# Patient Record
Sex: Female | Born: 1935 | Race: White | Hispanic: No | Smoking: Former smoker
Health system: Southern US, Community
[De-identification: ages and names within clinical notes are randomized; demographics above are authoritative.]

## PROBLEM LIST (undated history)

## (undated) DIAGNOSIS — G4730 Sleep apnea, unspecified: Secondary | ICD-10-CM

## (undated) DIAGNOSIS — E785 Hyperlipidemia, unspecified: Secondary | ICD-10-CM

## (undated) DIAGNOSIS — I350 Nonrheumatic aortic (valve) stenosis: Secondary | ICD-10-CM

## (undated) DIAGNOSIS — I10 Essential (primary) hypertension: Secondary | ICD-10-CM

## (undated) DIAGNOSIS — B377 Candidal sepsis: Secondary | ICD-10-CM

## (undated) DIAGNOSIS — I2510 Atherosclerotic heart disease of native coronary artery without angina pectoris: Secondary | ICD-10-CM

## (undated) DIAGNOSIS — C801 Malignant (primary) neoplasm, unspecified: Secondary | ICD-10-CM

## (undated) DIAGNOSIS — M810 Age-related osteoporosis without current pathological fracture: Secondary | ICD-10-CM

## (undated) DIAGNOSIS — Z952 Presence of prosthetic heart valve: Secondary | ICD-10-CM

## (undated) DIAGNOSIS — Z9289 Personal history of other medical treatment: Secondary | ICD-10-CM

## (undated) DIAGNOSIS — I4891 Unspecified atrial fibrillation: Secondary | ICD-10-CM

## (undated) DIAGNOSIS — F0390 Unspecified dementia without behavioral disturbance: Secondary | ICD-10-CM

## (undated) DIAGNOSIS — I4819 Other persistent atrial fibrillation: Secondary | ICD-10-CM

## (undated) DIAGNOSIS — E039 Hypothyroidism, unspecified: Secondary | ICD-10-CM

## (undated) HISTORY — DX: Essential (primary) hypertension: I10

## (undated) HISTORY — PX: JOINT REPLACEMENT: SHX530

## (undated) HISTORY — DX: Hypothyroidism, unspecified: E03.9

## (undated) HISTORY — PX: KNEE SURGERY: SHX244

## (undated) HISTORY — DX: Sleep apnea, unspecified: G47.30

## (undated) HISTORY — DX: Hyperlipidemia, unspecified: E78.5

## (undated) HISTORY — PX: TONSILLECTOMY: SUR1361

## (undated) HISTORY — DX: Age-related osteoporosis without current pathological fracture: M81.0

---

## 1999-03-09 ENCOUNTER — Other Ambulatory Visit: Admission: RE | Admit: 1999-03-09 | Discharge: 1999-03-09 | Payer: Self-pay | Admitting: Obstetrics and Gynecology

## 1999-08-22 ENCOUNTER — Encounter: Payer: Self-pay | Admitting: Plastic Surgery

## 1999-08-22 ENCOUNTER — Ambulatory Visit (HOSPITAL_COMMUNITY): Admission: RE | Admit: 1999-08-22 | Discharge: 1999-08-22 | Payer: Self-pay | Admitting: Plastic Surgery

## 1999-09-14 ENCOUNTER — Inpatient Hospital Stay (HOSPITAL_COMMUNITY): Admission: RE | Admit: 1999-09-14 | Discharge: 1999-09-17 | Payer: Self-pay | Admitting: Plastic Surgery

## 2000-07-10 ENCOUNTER — Other Ambulatory Visit: Admission: RE | Admit: 2000-07-10 | Discharge: 2000-07-10 | Payer: Self-pay | Admitting: Obstetrics and Gynecology

## 2000-08-01 ENCOUNTER — Encounter: Payer: Self-pay | Admitting: Obstetrics and Gynecology

## 2000-08-06 ENCOUNTER — Ambulatory Visit (HOSPITAL_COMMUNITY): Admission: RE | Admit: 2000-08-06 | Discharge: 2000-08-06 | Payer: Self-pay | Admitting: Obstetrics and Gynecology

## 2000-08-06 ENCOUNTER — Encounter (INDEPENDENT_AMBULATORY_CARE_PROVIDER_SITE_OTHER): Payer: Self-pay | Admitting: Specialist

## 2000-08-11 ENCOUNTER — Encounter: Payer: Self-pay | Admitting: Orthopedic Surgery

## 2000-08-19 ENCOUNTER — Ambulatory Visit (HOSPITAL_COMMUNITY): Admission: RE | Admit: 2000-08-19 | Discharge: 2000-08-19 | Payer: Self-pay | Admitting: *Deleted

## 2000-09-30 ENCOUNTER — Inpatient Hospital Stay (HOSPITAL_COMMUNITY): Admission: RE | Admit: 2000-09-30 | Discharge: 2000-10-07 | Payer: Self-pay | Admitting: Orthopedic Surgery

## 2000-09-30 ENCOUNTER — Encounter: Payer: Self-pay | Admitting: Orthopedic Surgery

## 2000-11-27 ENCOUNTER — Encounter: Admission: RE | Admit: 2000-11-27 | Discharge: 2000-11-27 | Payer: Self-pay | Admitting: Obstetrics and Gynecology

## 2000-11-27 ENCOUNTER — Encounter: Payer: Self-pay | Admitting: Obstetrics and Gynecology

## 2001-11-30 ENCOUNTER — Encounter: Admission: RE | Admit: 2001-11-30 | Discharge: 2001-11-30 | Payer: Self-pay | Admitting: Obstetrics and Gynecology

## 2001-11-30 ENCOUNTER — Encounter: Payer: Self-pay | Admitting: Obstetrics and Gynecology

## 2002-10-18 ENCOUNTER — Ambulatory Visit (HOSPITAL_BASED_OUTPATIENT_CLINIC_OR_DEPARTMENT_OTHER): Admission: RE | Admit: 2002-10-18 | Discharge: 2002-10-18 | Payer: Self-pay | Admitting: General Practice

## 2002-12-02 ENCOUNTER — Encounter: Payer: Self-pay | Admitting: Obstetrics and Gynecology

## 2002-12-02 ENCOUNTER — Encounter: Admission: RE | Admit: 2002-12-02 | Discharge: 2002-12-02 | Payer: Self-pay | Admitting: Obstetrics and Gynecology

## 2003-02-09 ENCOUNTER — Ambulatory Visit (HOSPITAL_COMMUNITY): Admission: RE | Admit: 2003-02-09 | Discharge: 2003-02-09 | Payer: Self-pay | Admitting: Gastroenterology

## 2003-02-22 ENCOUNTER — Encounter: Payer: Self-pay | Admitting: Orthopedic Surgery

## 2003-02-28 ENCOUNTER — Encounter: Payer: Self-pay | Admitting: Orthopedic Surgery

## 2003-02-28 ENCOUNTER — Inpatient Hospital Stay (HOSPITAL_COMMUNITY): Admission: RE | Admit: 2003-02-28 | Discharge: 2003-03-07 | Payer: Self-pay | Admitting: Orthopedic Surgery

## 2003-08-20 ENCOUNTER — Emergency Department (HOSPITAL_COMMUNITY): Admission: EM | Admit: 2003-08-20 | Discharge: 2003-08-20 | Payer: Self-pay | Admitting: Emergency Medicine

## 2003-08-20 ENCOUNTER — Encounter: Payer: Self-pay | Admitting: *Deleted

## 2003-11-03 ENCOUNTER — Ambulatory Visit (HOSPITAL_COMMUNITY): Admission: RE | Admit: 2003-11-03 | Discharge: 2003-11-03 | Payer: Self-pay | Admitting: Orthopedic Surgery

## 2003-12-06 ENCOUNTER — Encounter: Admission: RE | Admit: 2003-12-06 | Discharge: 2003-12-06 | Payer: Self-pay | Admitting: Obstetrics and Gynecology

## 2004-04-12 ENCOUNTER — Ambulatory Visit (HOSPITAL_COMMUNITY): Admission: RE | Admit: 2004-04-12 | Discharge: 2004-04-12 | Payer: Self-pay | Admitting: Obstetrics and Gynecology

## 2004-05-04 ENCOUNTER — Emergency Department (HOSPITAL_COMMUNITY): Admission: EM | Admit: 2004-05-04 | Discharge: 2004-05-04 | Payer: Self-pay | Admitting: Emergency Medicine

## 2004-12-07 ENCOUNTER — Encounter: Admission: RE | Admit: 2004-12-07 | Discharge: 2004-12-07 | Payer: Self-pay | Admitting: Obstetrics and Gynecology

## 2005-08-28 IMAGING — US US TRANSVAGINAL NON-OB
1 series · 13 of 25 positions shown · non-contrast
Comparison: none

CLINICAL DATA: Right lower quadrant pain.  
TRANSABDOMINAL AND ENDOVAGINAL PELVIS ULTRASOUND:
Multiple images of the uterus and adnexa were obtained using a transabdominal and endovaginal approaches. 
The uterus has a maximal sagittal length of 6.9 cm and maximal AP width of 3.6 cm.  A homogeneous uterine myometrium is seen.  There is a small amount of fluid identified within the endometrial canal.  The majority of the endometrial lining appears thin and echogenic with a maximal AP width of 1.3 mm.  There is an area of focal thickening noted emanating off of the anterior upper uterine segment portion of the endometrial canal.  This is echogenic measuring approximately 2 x 2 mm and does demonstrate some Intralesional flow suggesting the presence of a focal polyp.  The remainder of the lining appears thin with a single layer measurement of 1 mm.  
Several areas of focally altered echotexture are identified compatible with focal fibroids.  One in the right lateral upper uterine segment measuring 1.5 x 1.0 x 1.4 cm and one in the posterior upper uterine segment measuring 0.9 x 0.7 x 0.7 cm.  
Both ovaries are seen and have a normal appearance with the left ovary measuring 2.8 x 1.0 x 1.8 cm and the right ovary measuring 2.7 x 2.5 x 1.8 cm.  No cul-de-sac or periovarian fluid is noted and no separate adnexal masses are seen.
IMPRESSION
Probable small polyp emanating off of the anterior portion of the otherwise thin endometrial stripe.  Focal fibroids with sizes and locations as noted above.  Normal ovaries.

[Series 1: unknown · 0.40mm/px · 13 of 38 slices shown]
[im 1/38]
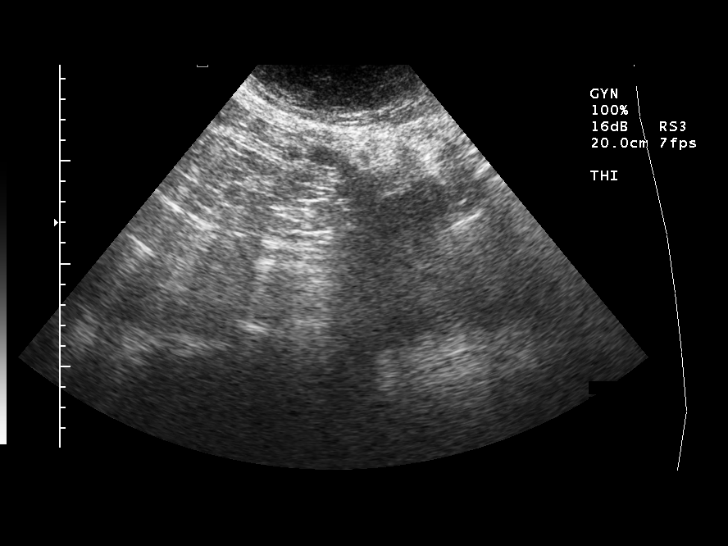
[im 4/38]
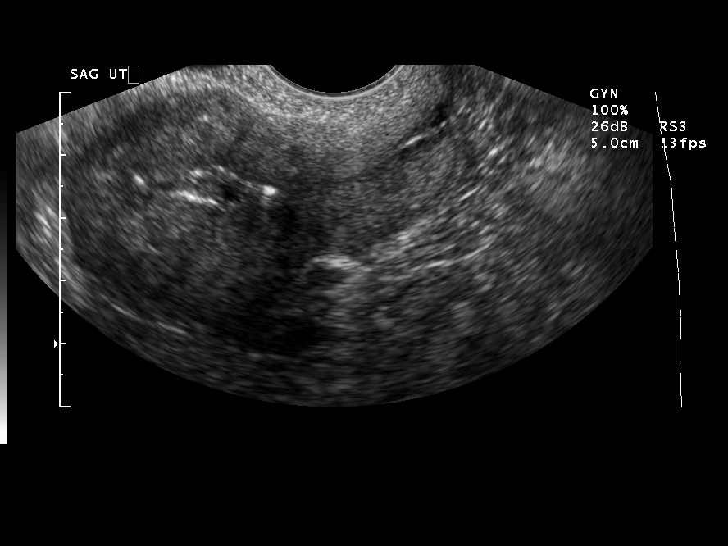
[im 7/38]
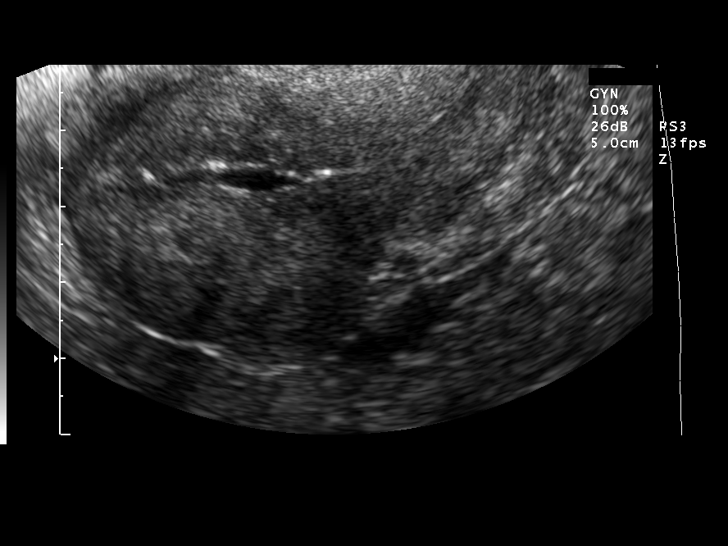
[im 10/38]
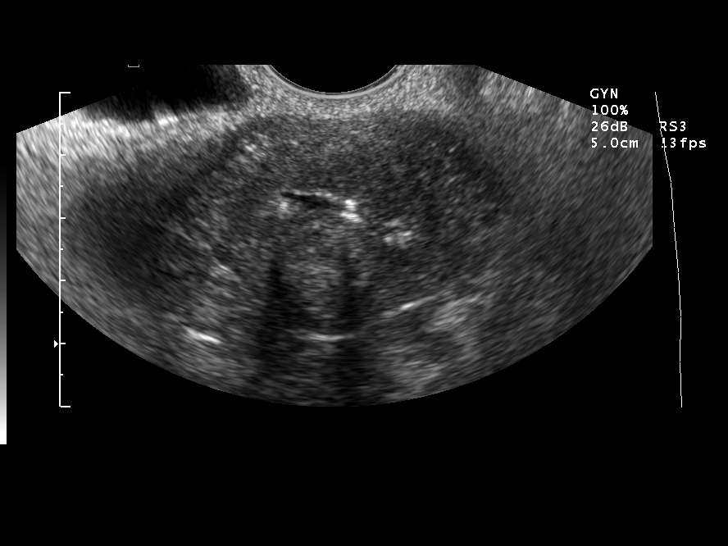
[im 13/38]
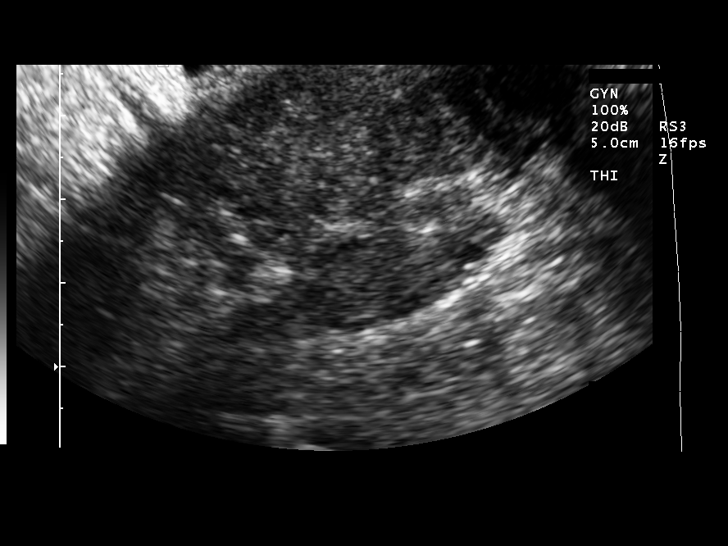
[im 16/38]
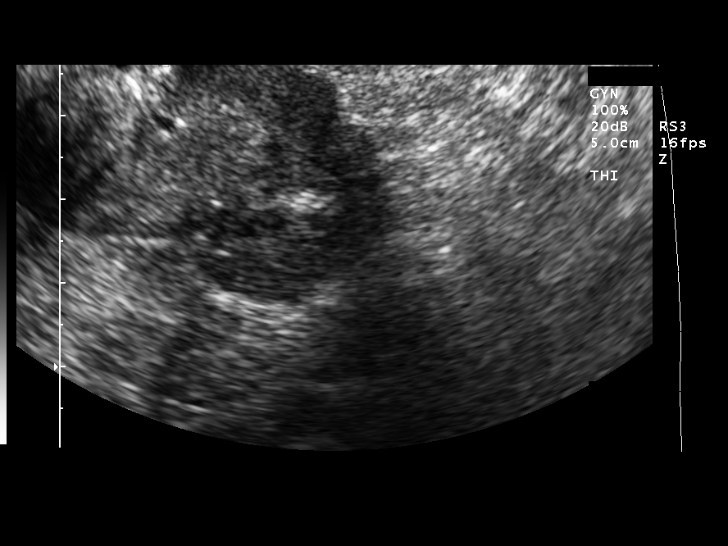
[im 19/38]
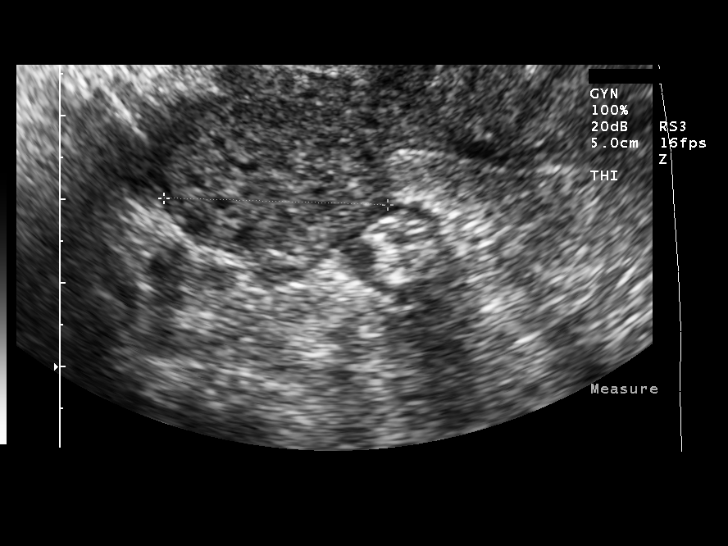
[im 22/38]
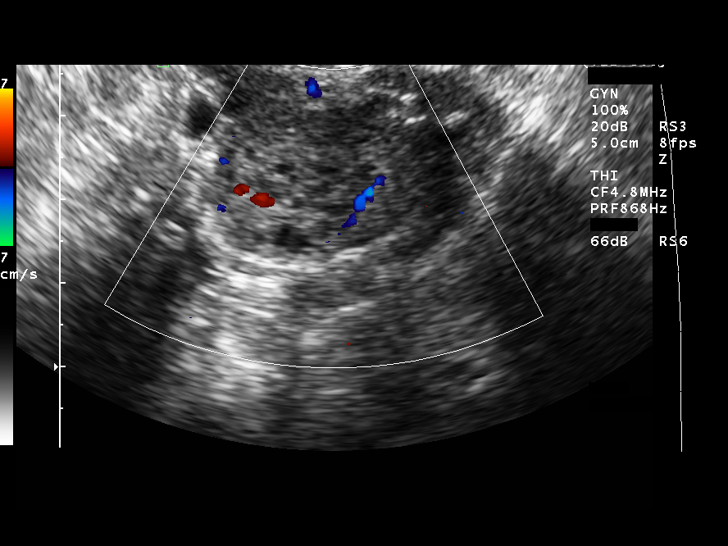
[im 25/38]
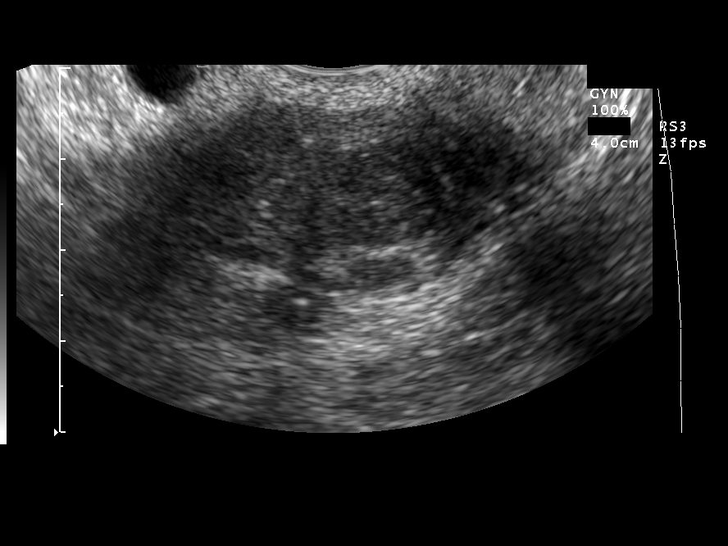
[im 28/38]
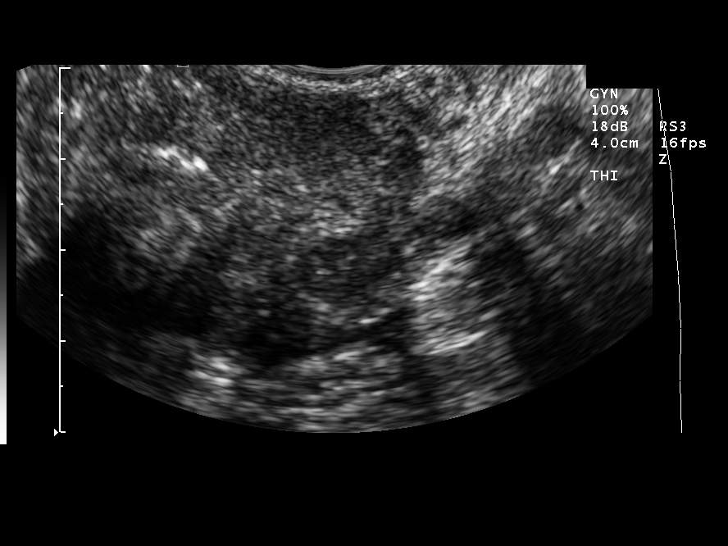
[im 31/38]
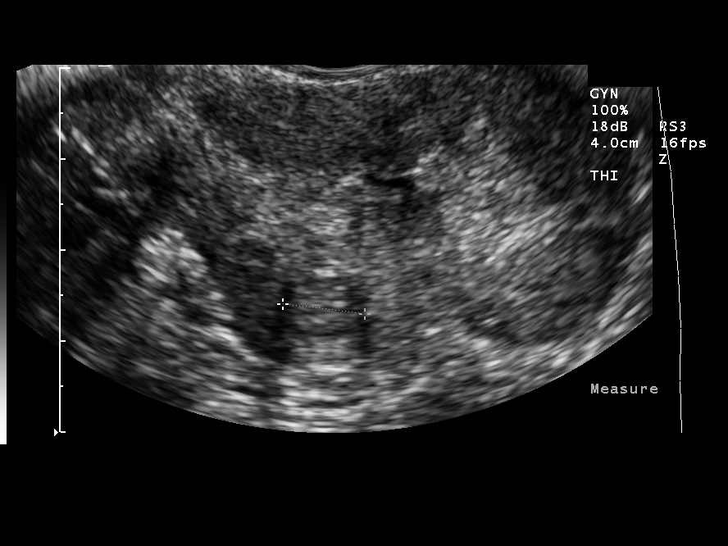
[im 34/38]
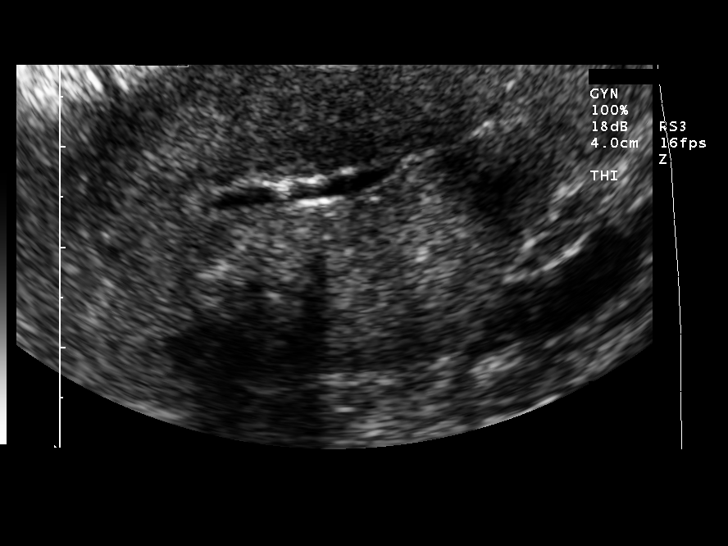
[im 38/38]
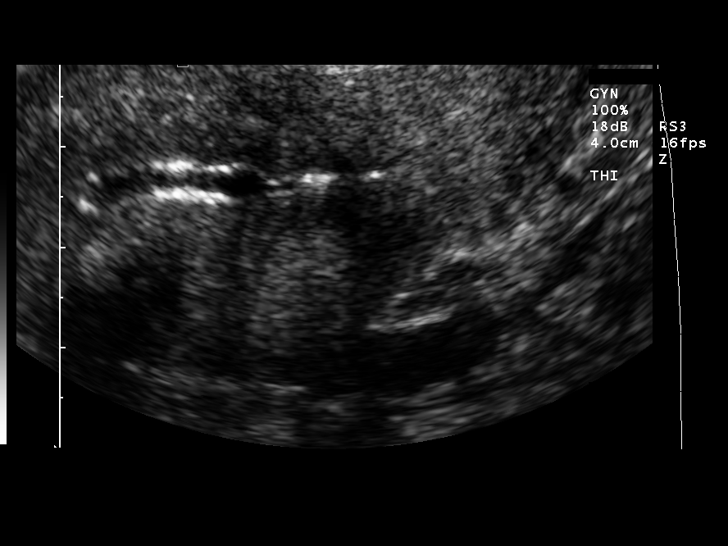

[13 of 25 positions shown; findings below may reference images not displayed]

## 2005-09-19 IMAGING — CR DG CHEST 2V
2 series · 2 of 2 positions shown · non-contrast
Comparison: none

CLINICAL DATA: Fell with pain
 CHEST
 Two views of the chest are compared to a portable film of 08/20/03.  Cardiomegaly is stable.  No active infiltrate of effusion is seen.  There are degenerative changes throughout the thoracic spine.  No compression deformity is seen.
 IMPRESSION
 Stable cardiomegaly.  No active lung disease.

[view not recorded (1 of 2)]
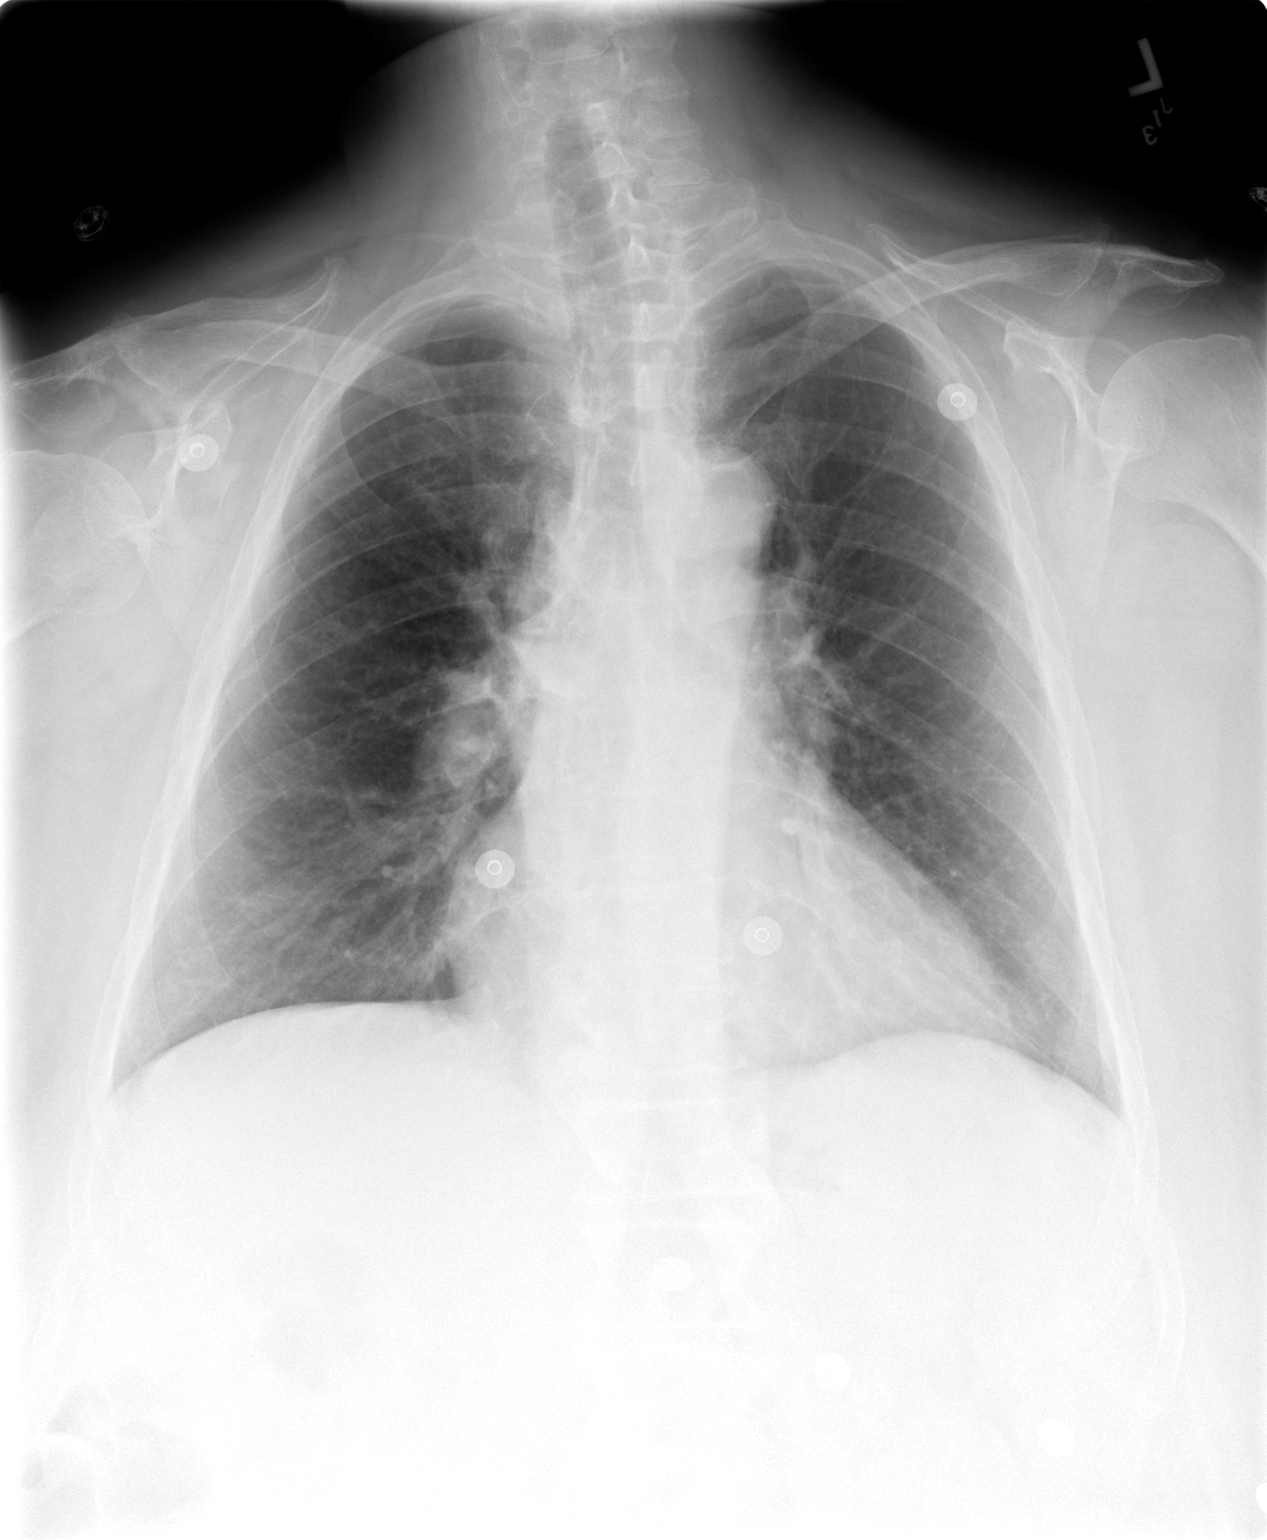

[view not recorded (2 of 2)]
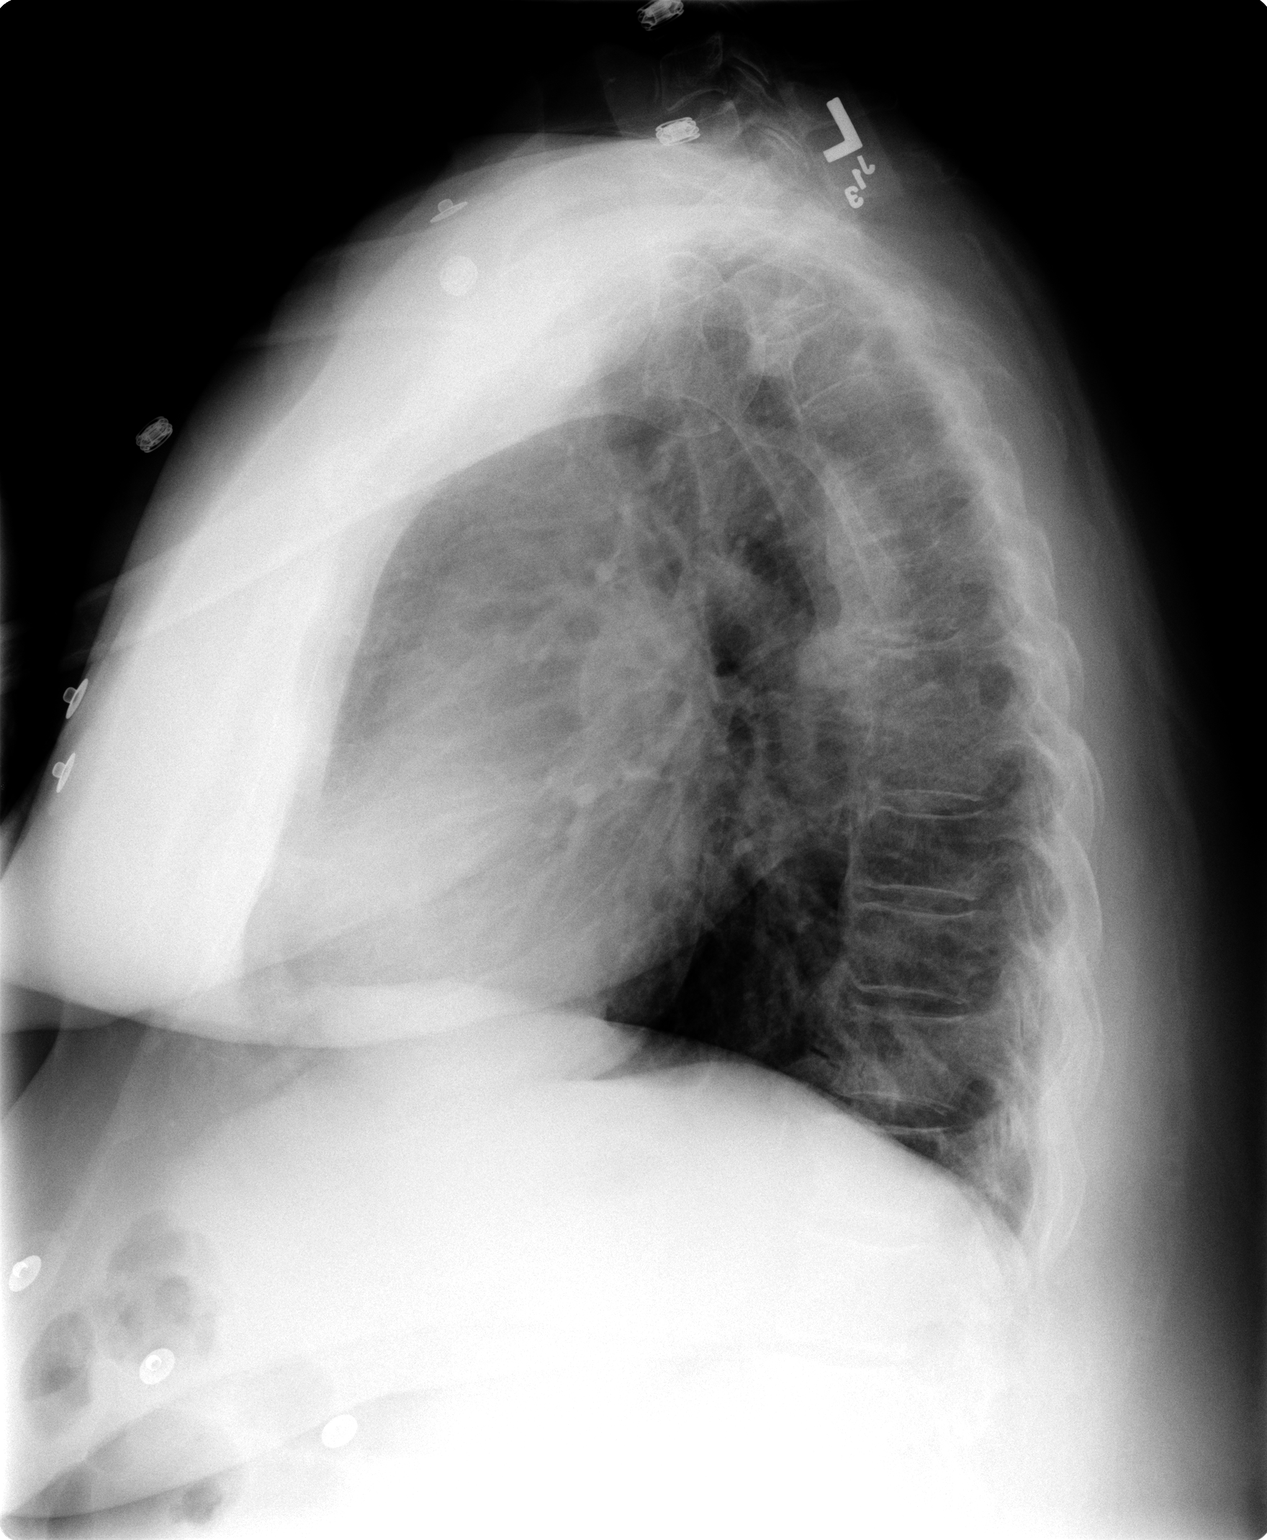

[2 of 2 positions shown; findings below may reference images not displayed]

## 2005-09-19 IMAGING — CT CT ABDOMEN W/ CM
1 series · 15 of 32 positions shown, 19 images · IV contrast (omnipaque)
Comparison: none

CLINICAL DATA: Fall.
 CT SCAN OF THE ABDOMEN WITH INTRAVENOUS AND ORAL CONTRAST ? 05/04/04
 Scans were performed following intravenous injection of 100 cc of Omnipaque 300.
 The scan demonstrates that the liver, spleen, pancreas, and adrenal glands appear normal.  There are numerous parapelvic cysts in both kidneys but there is no evidence of hydronephrosis.  There is slight deformity of the anterior aspect of the right sixth, seventh, and eighth ribs consistent with slight cortical fractures.  In addition, there is a deformity of the anterior aspect of the left sixth rib.  I suspect these could be new slight fractures.  
 The scan does demonstrate evidence of at least one gallstone.  
 Degenerative changes are present in the lower lumbar spine.
 There is no free air or free fluid in the abdomen.
 IMPRESSION
 1.  Probable nondisplaced slight cortical buckle fractures of several right anterior ribs and one left anterior rib.
 2.  Gallstones.
 3.  No acute abnormality of the liver or spleen.
 CT SCAN OF THE PELVIS WITH INTRAVENOUS CONTRAST ? 05/04/04
 There is no acute bony abnormality.  The uterus and ovaries demonstrate no discrete abnormality.  The right ovary is minimally prominent at 4.0 x 3.0 cm but there is no distinct mass or free fluid.  
 No significant abnormality of the pelvis.

[Series 2: routine abdomen · axial · 0.92mm/px · z∈[-445,-60]mm · 15 of 124 slices shown, 19 images]
[im 8/124  soft-tissue]
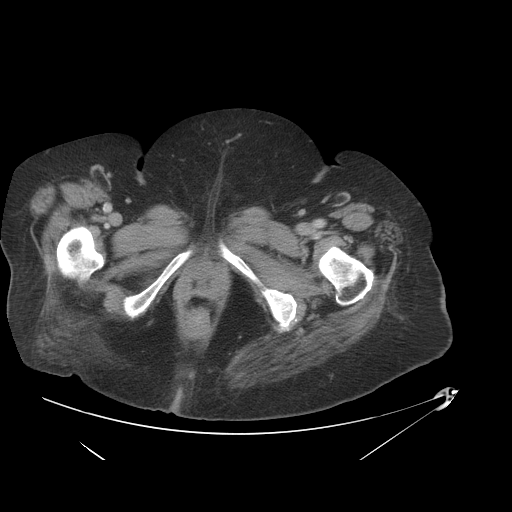
[im 8/124  bone]
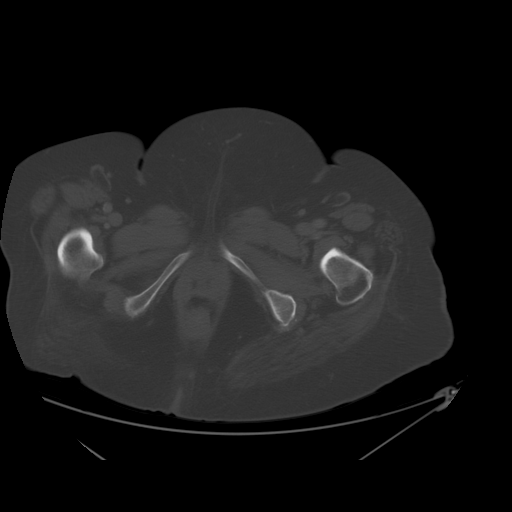
[im 16/124  soft-tissue]
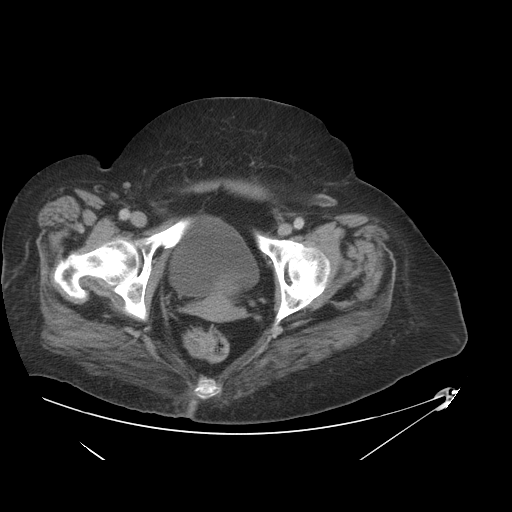
[im 24/124  soft-tissue]
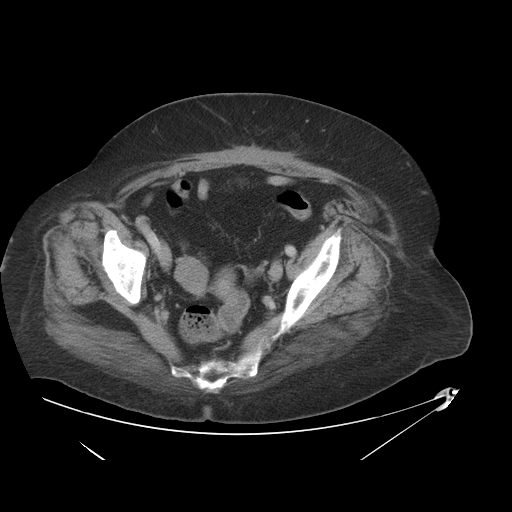
[im 36/124  soft-tissue]
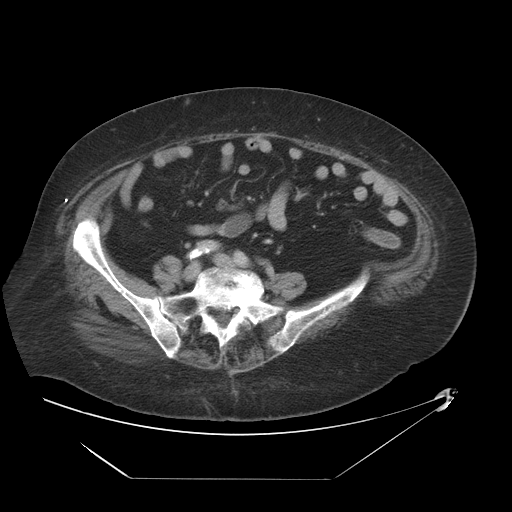
[im 44/124  soft-tissue]
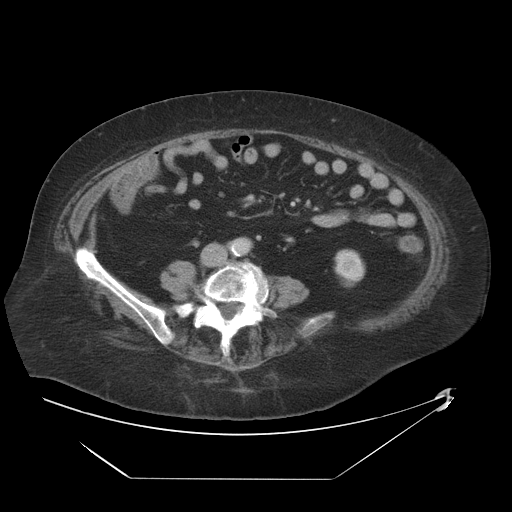
[im 52/124  soft-tissue]
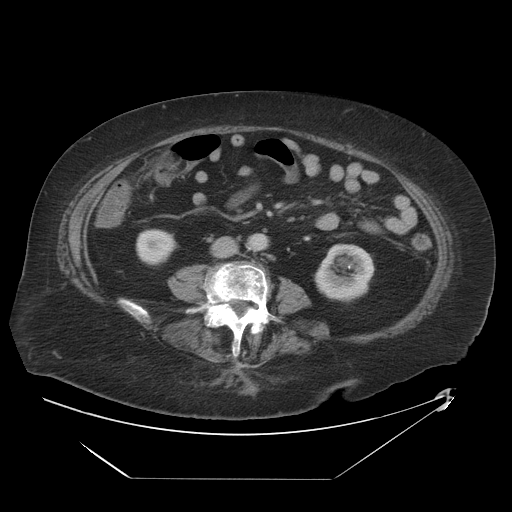
[im 64/124  soft-tissue]
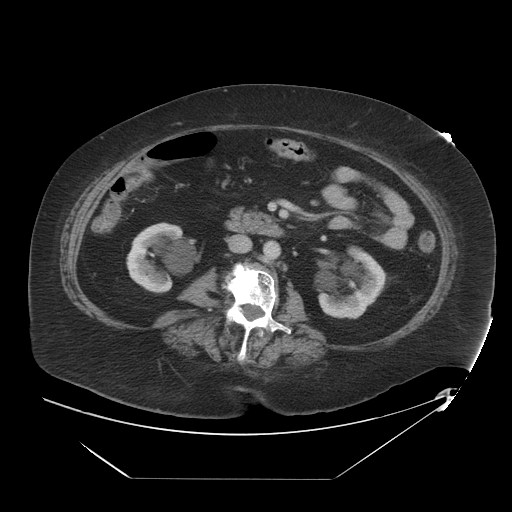
[im 72/124  soft-tissue]
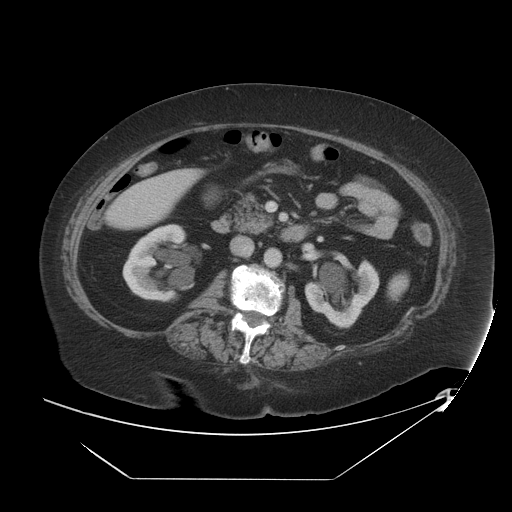
[im 80/124  soft-tissue]
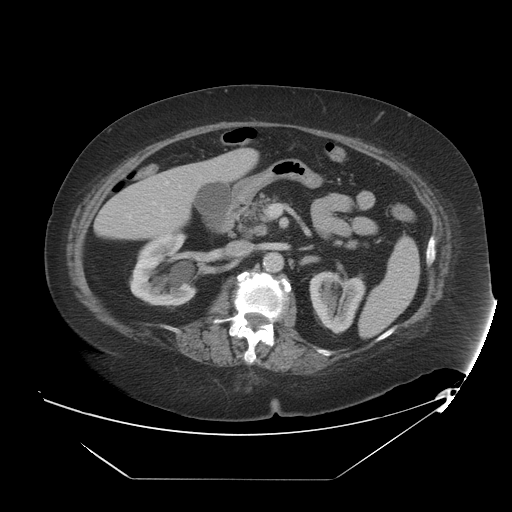
[im 80/124  bone]
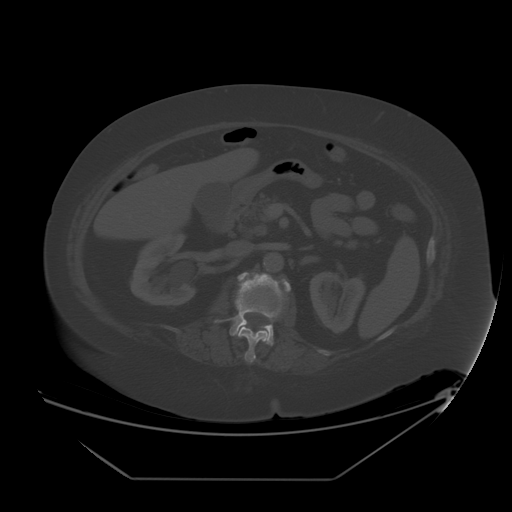
[im 88/124  soft-tissue]
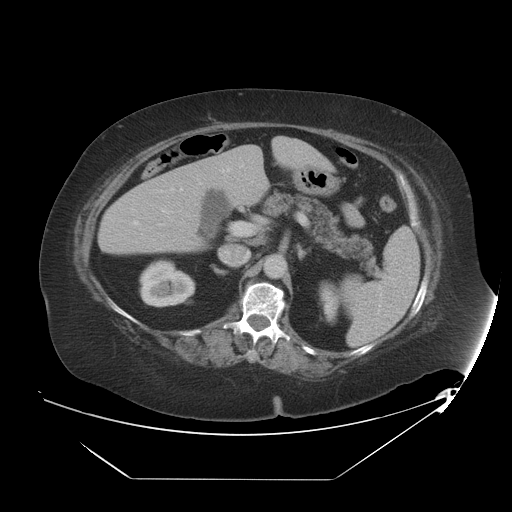
[im 100/124  soft-tissue]
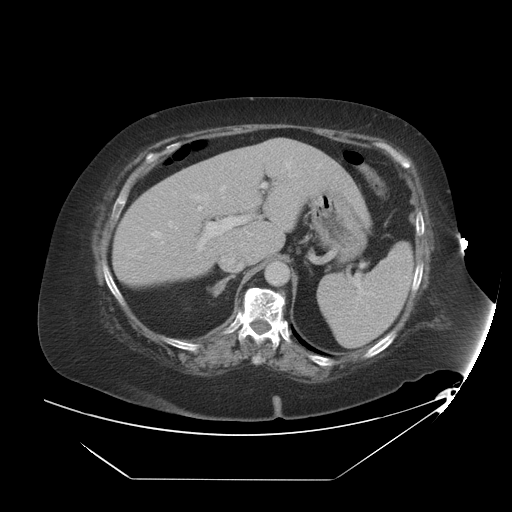
[im 108/124  soft-tissue]
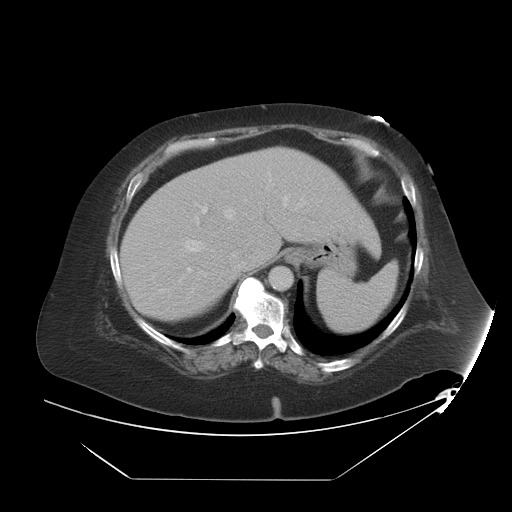
[im 108/124  lung]
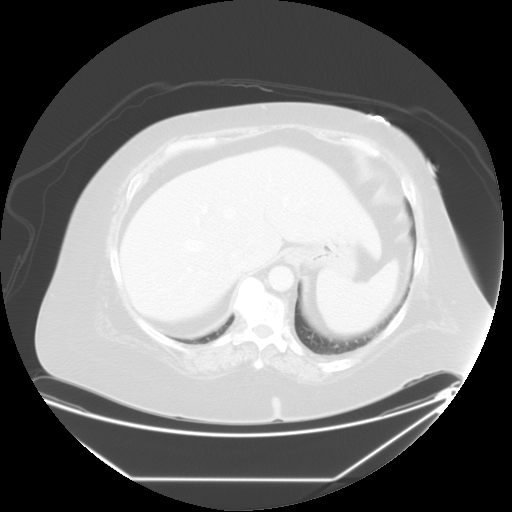
[im 112/124  lung]
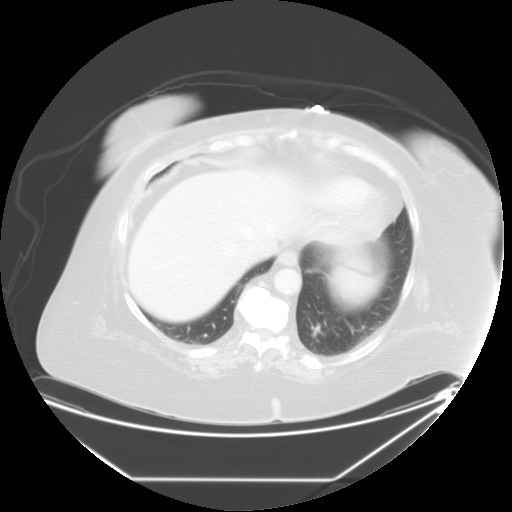
[im 116/124  soft-tissue]
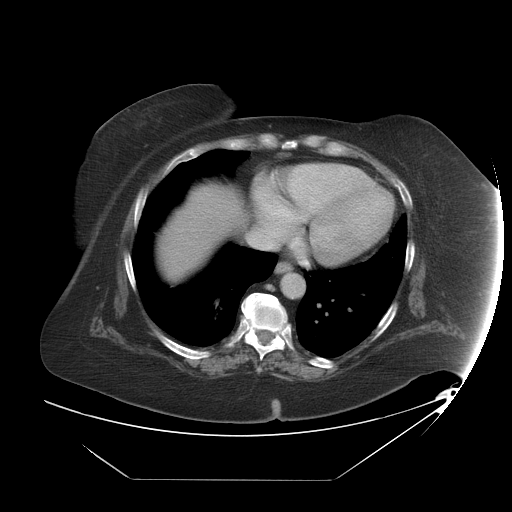
[im 116/124  lung]
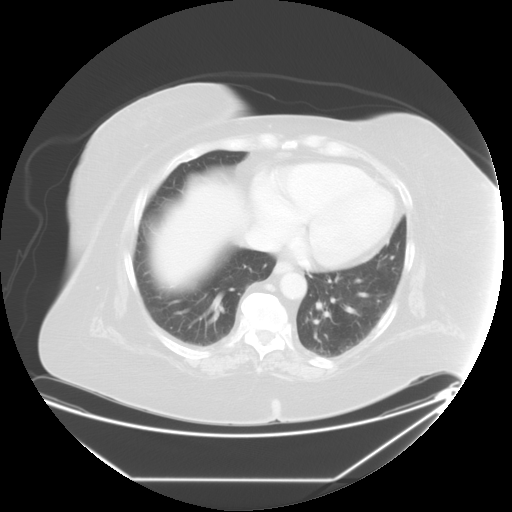
[im 120/124  lung]
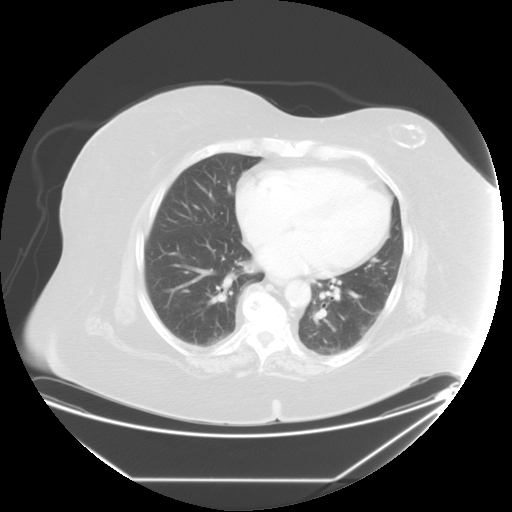

[15 of 32 positions shown; findings below may reference images not displayed]

## 2005-09-19 IMAGING — CR DG WRIST COMPLETE 3+V*R*
2 series · 2 of 2 positions shown · non-contrast
Comparison: none

CLINICAL DATA: Recent fall with pain.
 RIGHT WRIST 
 Four views of the right wrist were obtained.  There is some soft tissue swelling over the dorsum of the wrist but no acute fracture is seen.  The radiocarpal joint space is normal.  There is some arterial calcification noted.  
 IMPRESSION
 No acute fracture.

[view not recorded (1 of 2)]
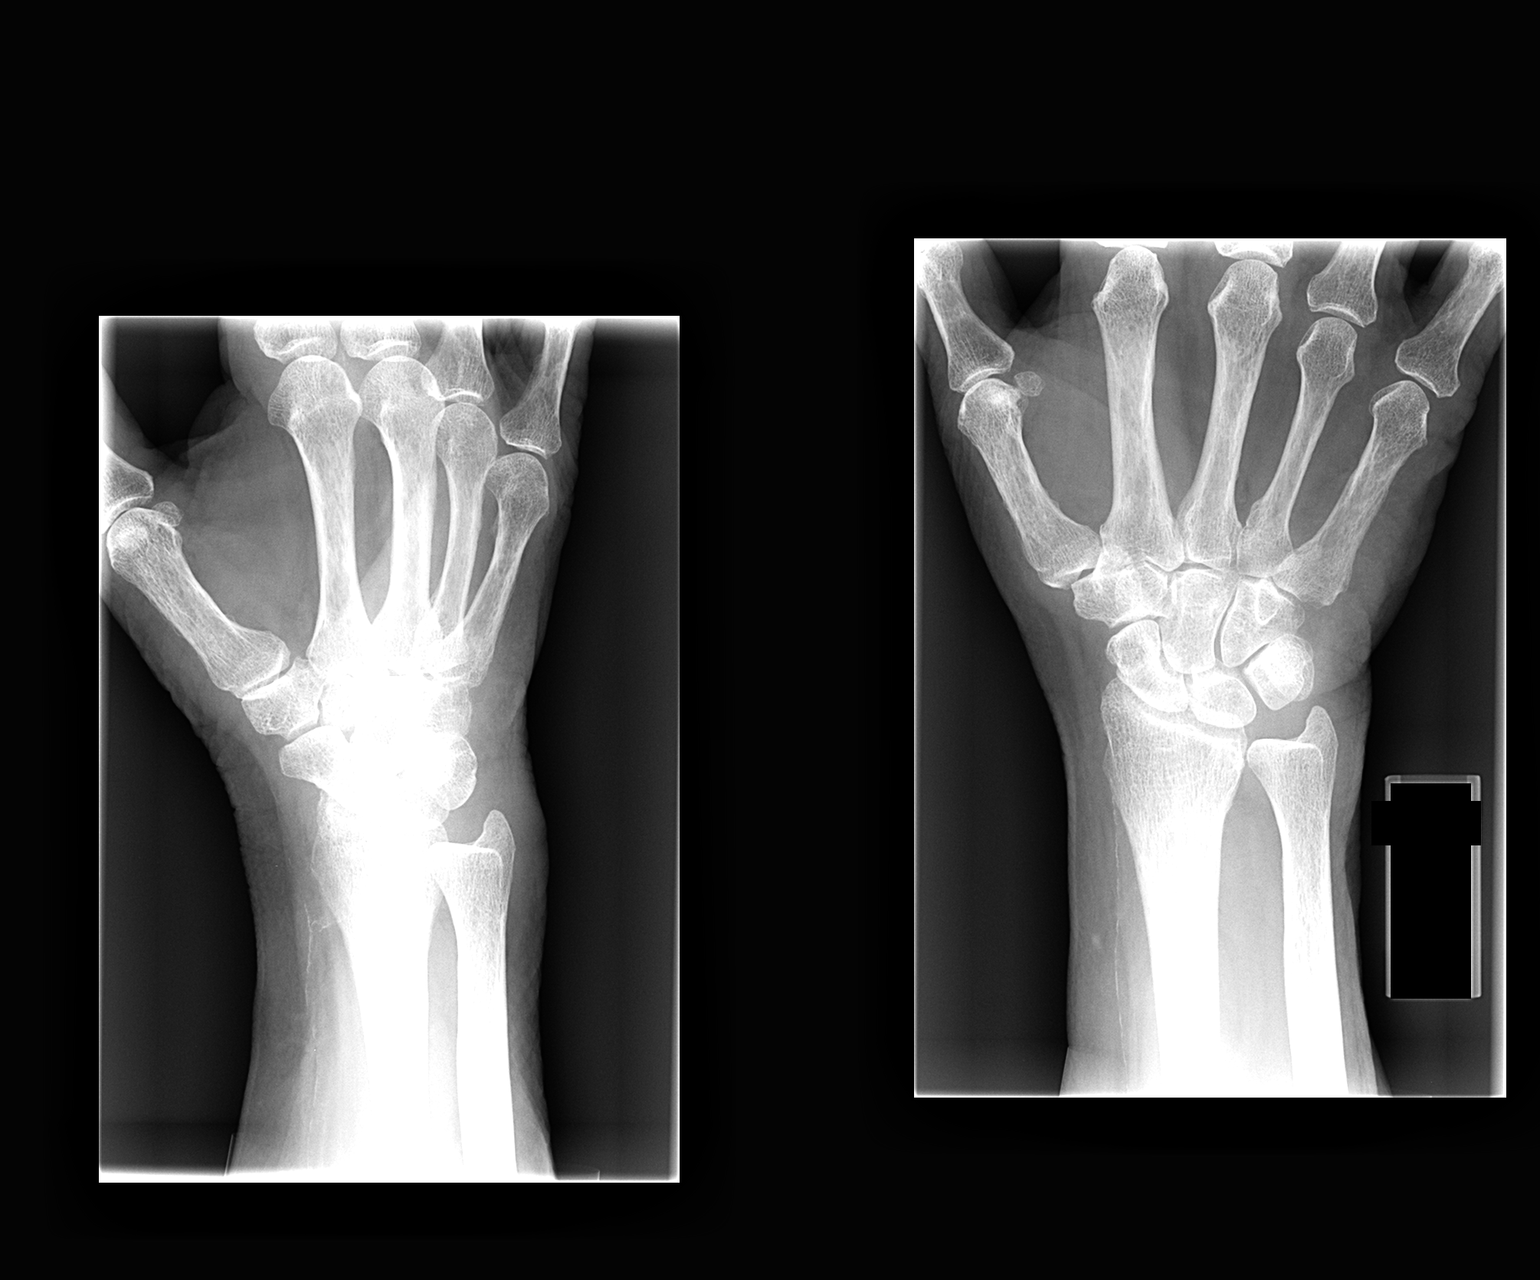

[view not recorded (2 of 2)]
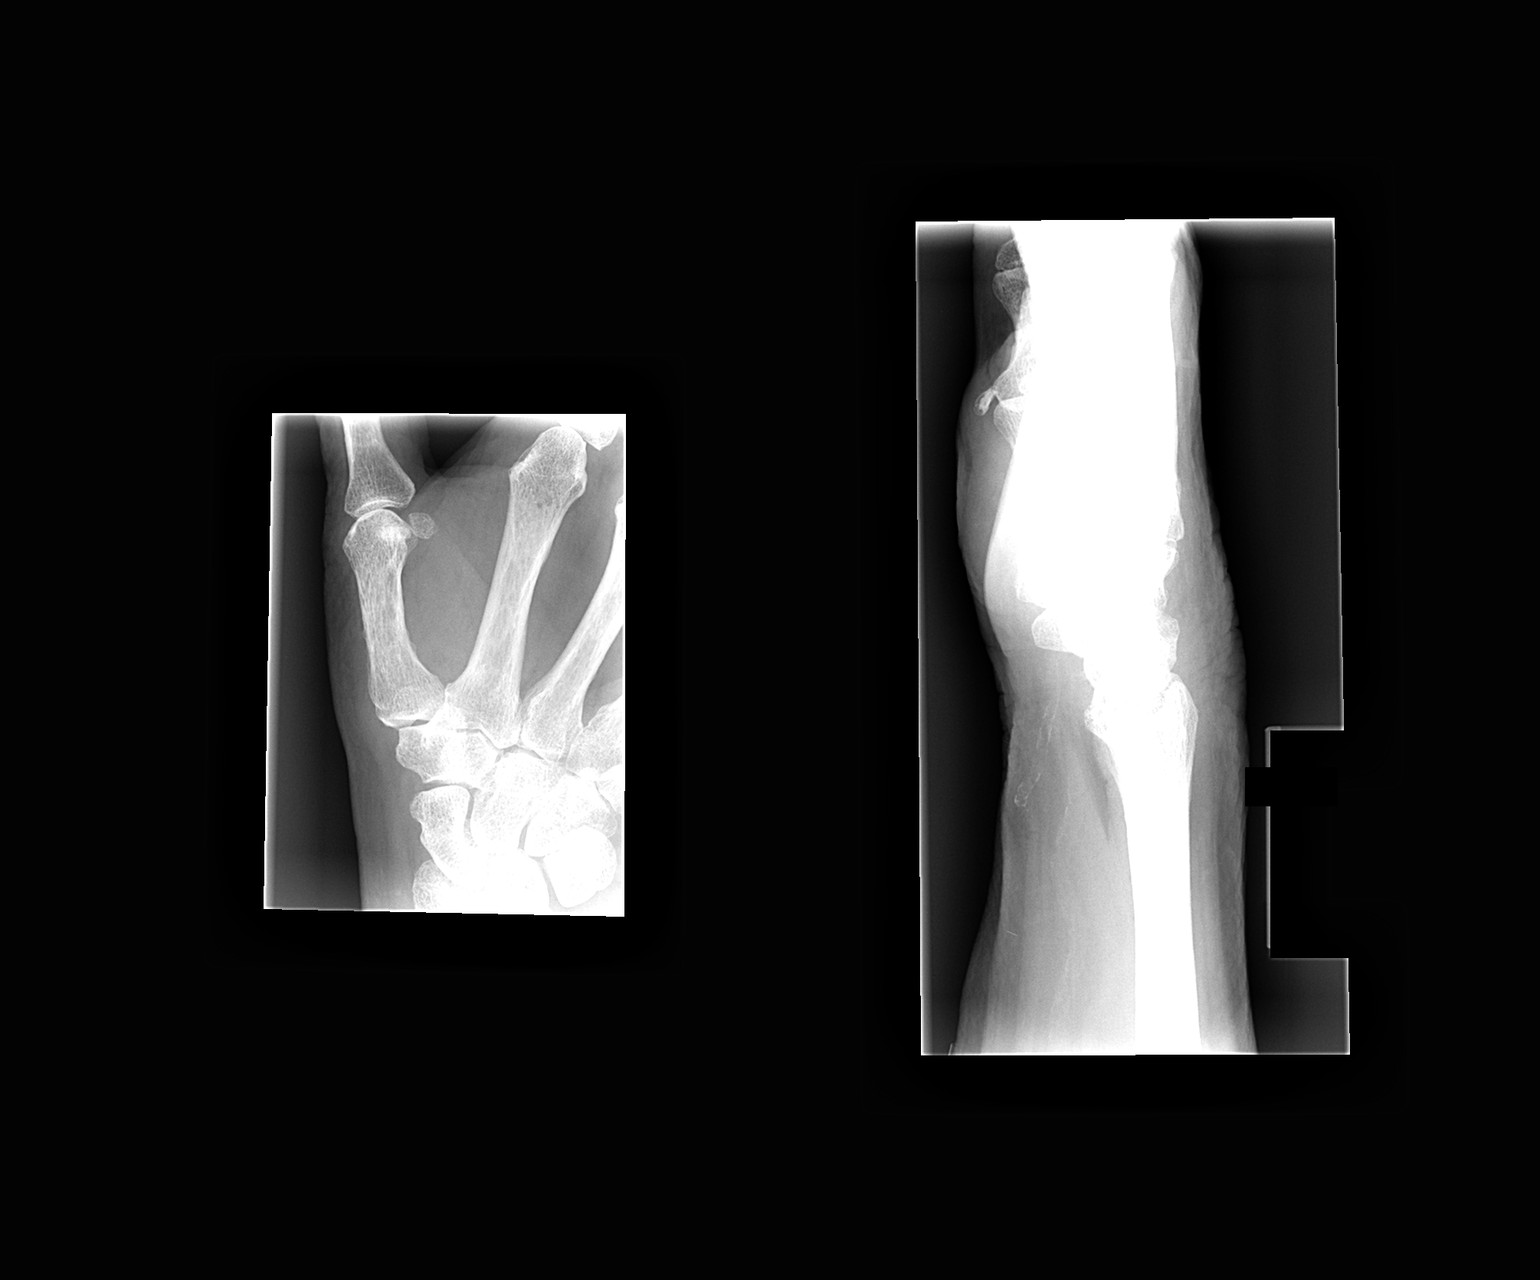

[2 of 2 positions shown; findings below may reference images not displayed]

## 2005-12-17 ENCOUNTER — Encounter: Admission: RE | Admit: 2005-12-17 | Discharge: 2005-12-17 | Payer: Self-pay | Admitting: Endocrinology

## 2006-01-02 ENCOUNTER — Emergency Department (HOSPITAL_COMMUNITY): Admission: EM | Admit: 2006-01-02 | Discharge: 2006-01-02 | Payer: Self-pay | Admitting: Emergency Medicine

## 2006-12-18 ENCOUNTER — Encounter: Admission: RE | Admit: 2006-12-18 | Discharge: 2006-12-18 | Payer: Self-pay | Admitting: Obstetrics and Gynecology

## 2006-12-29 ENCOUNTER — Encounter: Admission: RE | Admit: 2006-12-29 | Discharge: 2006-12-29 | Payer: Self-pay | Admitting: Obstetrics and Gynecology

## 2007-05-04 IMAGING — MG MM MAMMO SCREENING
4 series · 4 of 4 positions shown · non-contrast
Comparison: none

SCREENING MAMMOGRAM:
There is a fibrofatty pattern.  No masses or malignant type calcifications are identified.

[R CC]
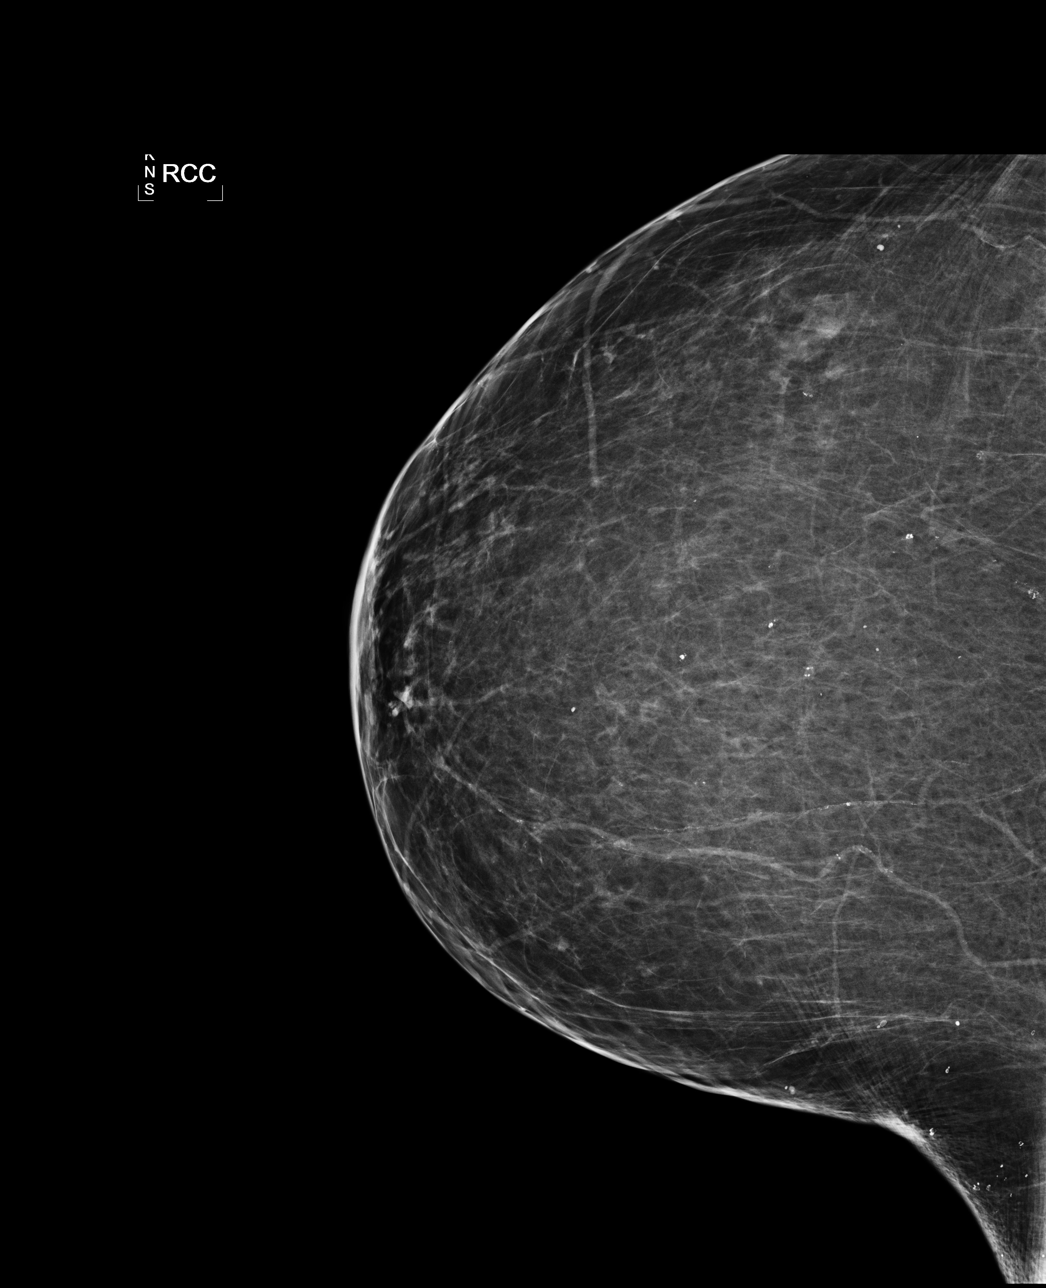

[R MLO]
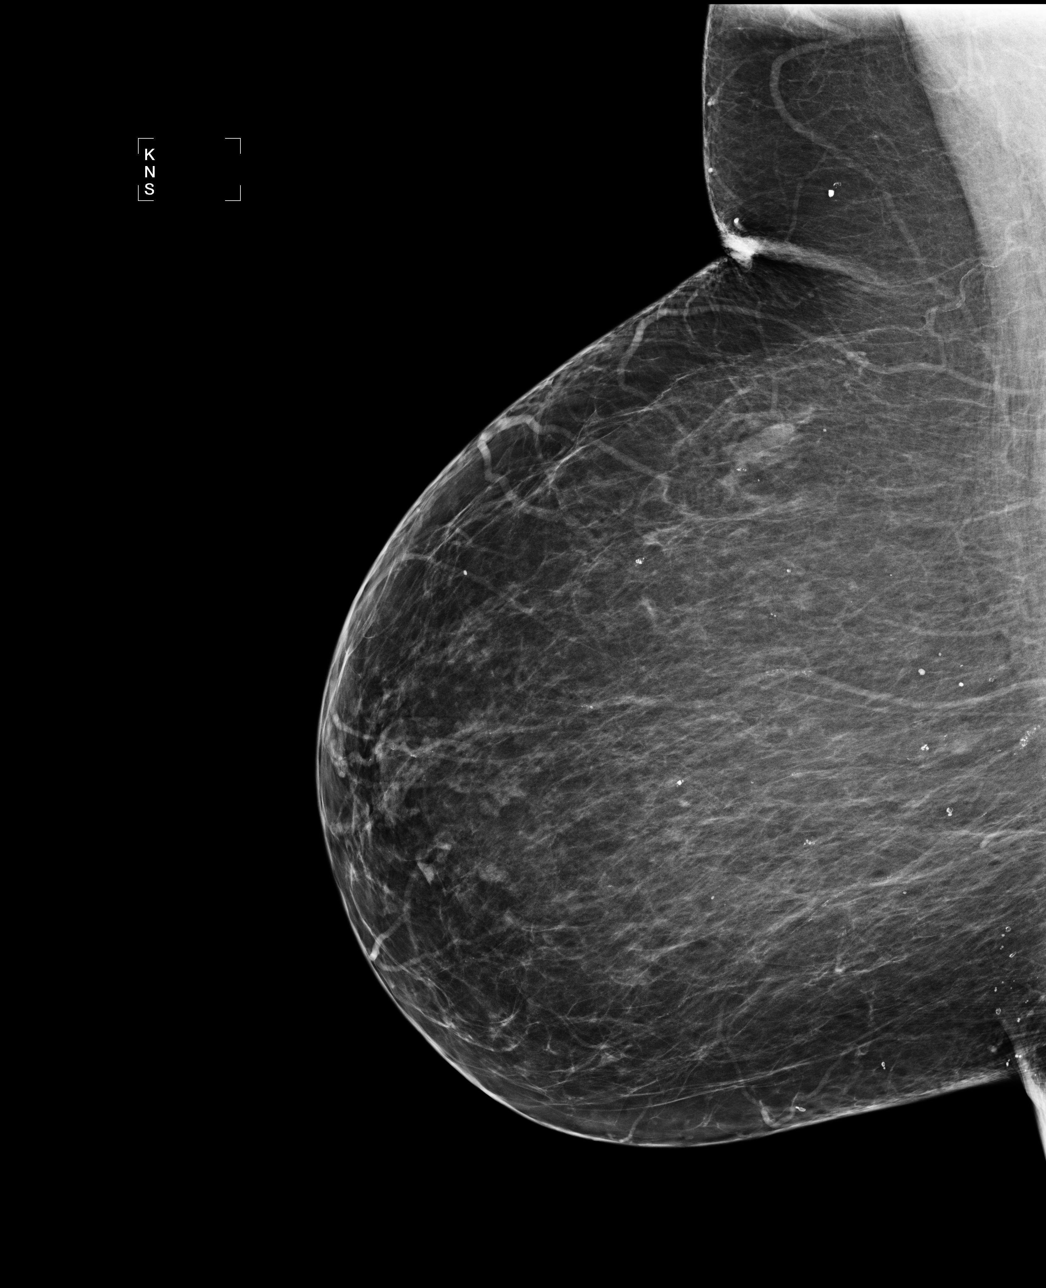

[L CC]
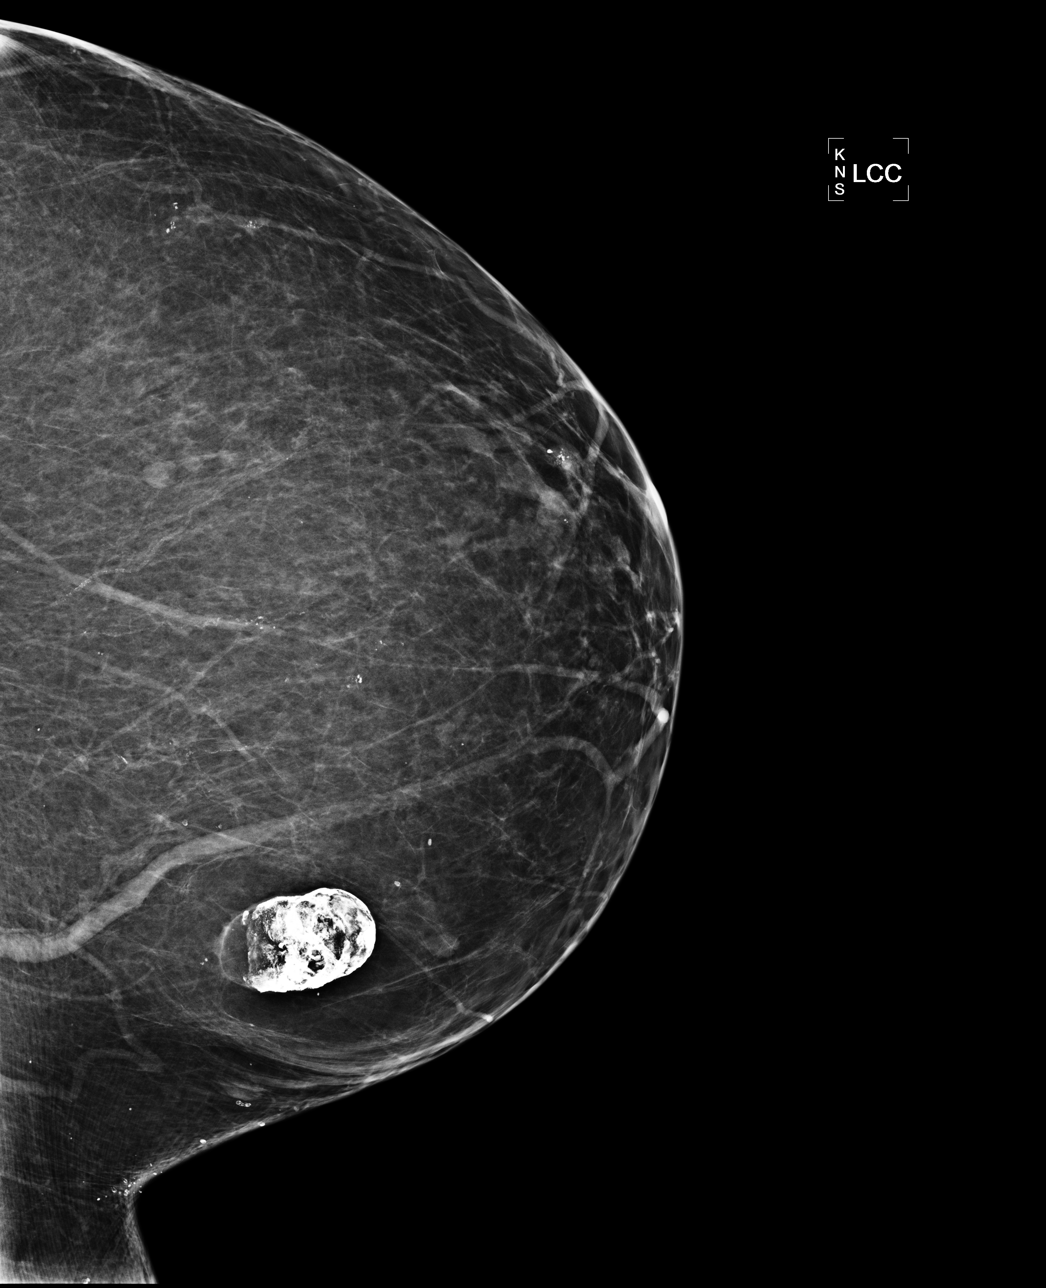

[L MLO]
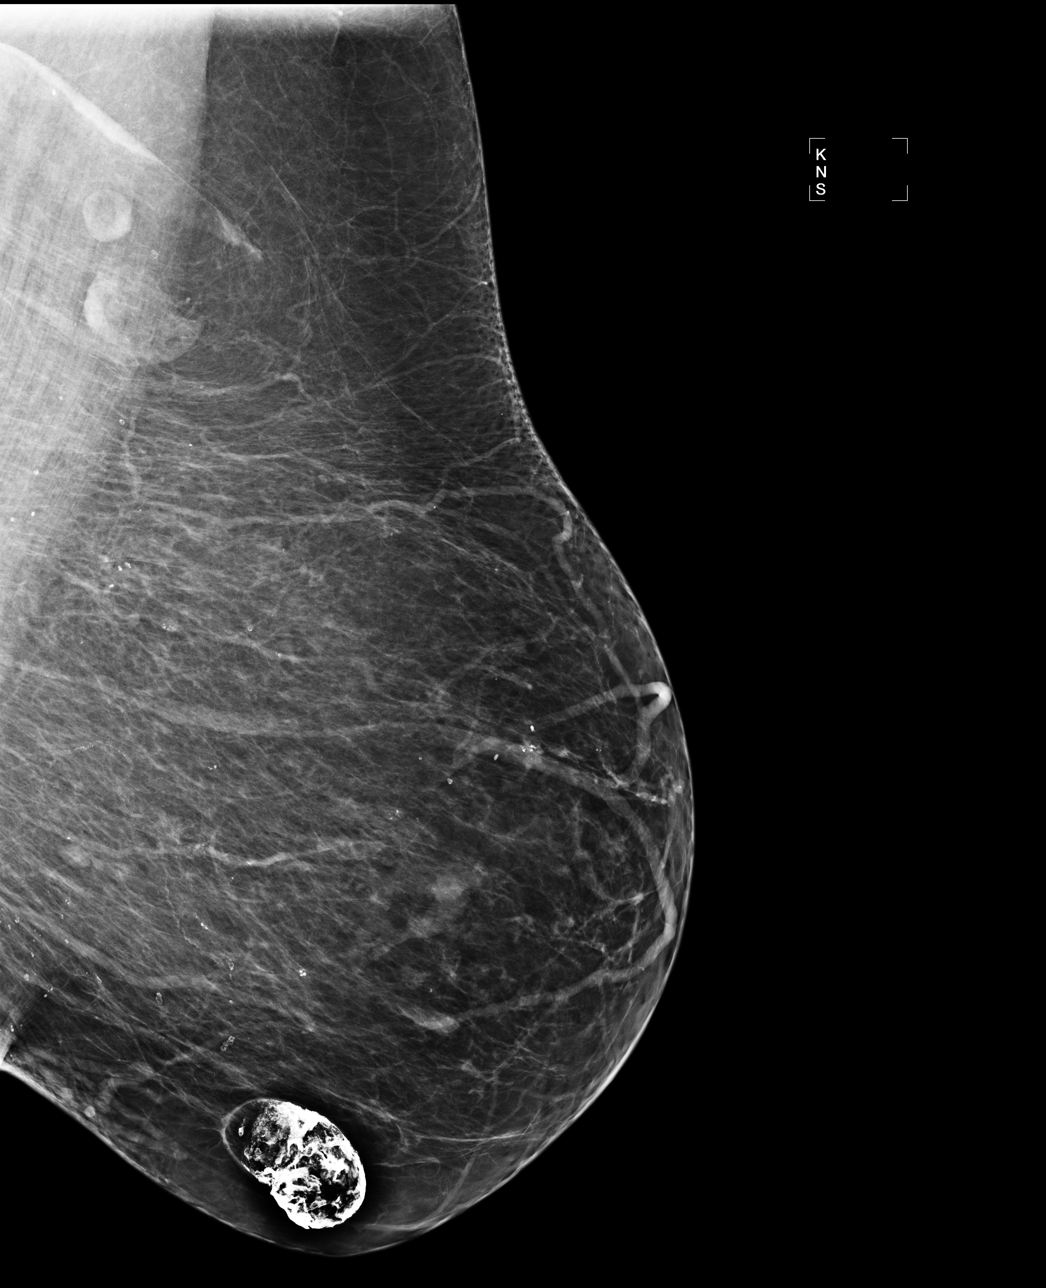

[4 of 4 positions shown; findings below may reference images not displayed]

IMPRESSION: No specific mammographic evidence of malignancy.  Next screening mammogram is recommended in one 
year.

ASSESSMENT: Negative - BI-RADS 1

Screening mammogram in 1 year.

## 2007-05-20 IMAGING — CR DG PELVIS 1-2V
1 series · 1 of 1 positions shown · non-contrast
Comparison: [REDACTED] pelvic CT 05/04/04.

CLINICAL DATA: Post fall with right hip, sacral and low back pain. 
 PELVIS - JAYWP ? 01/02/06:

[view not recorded]
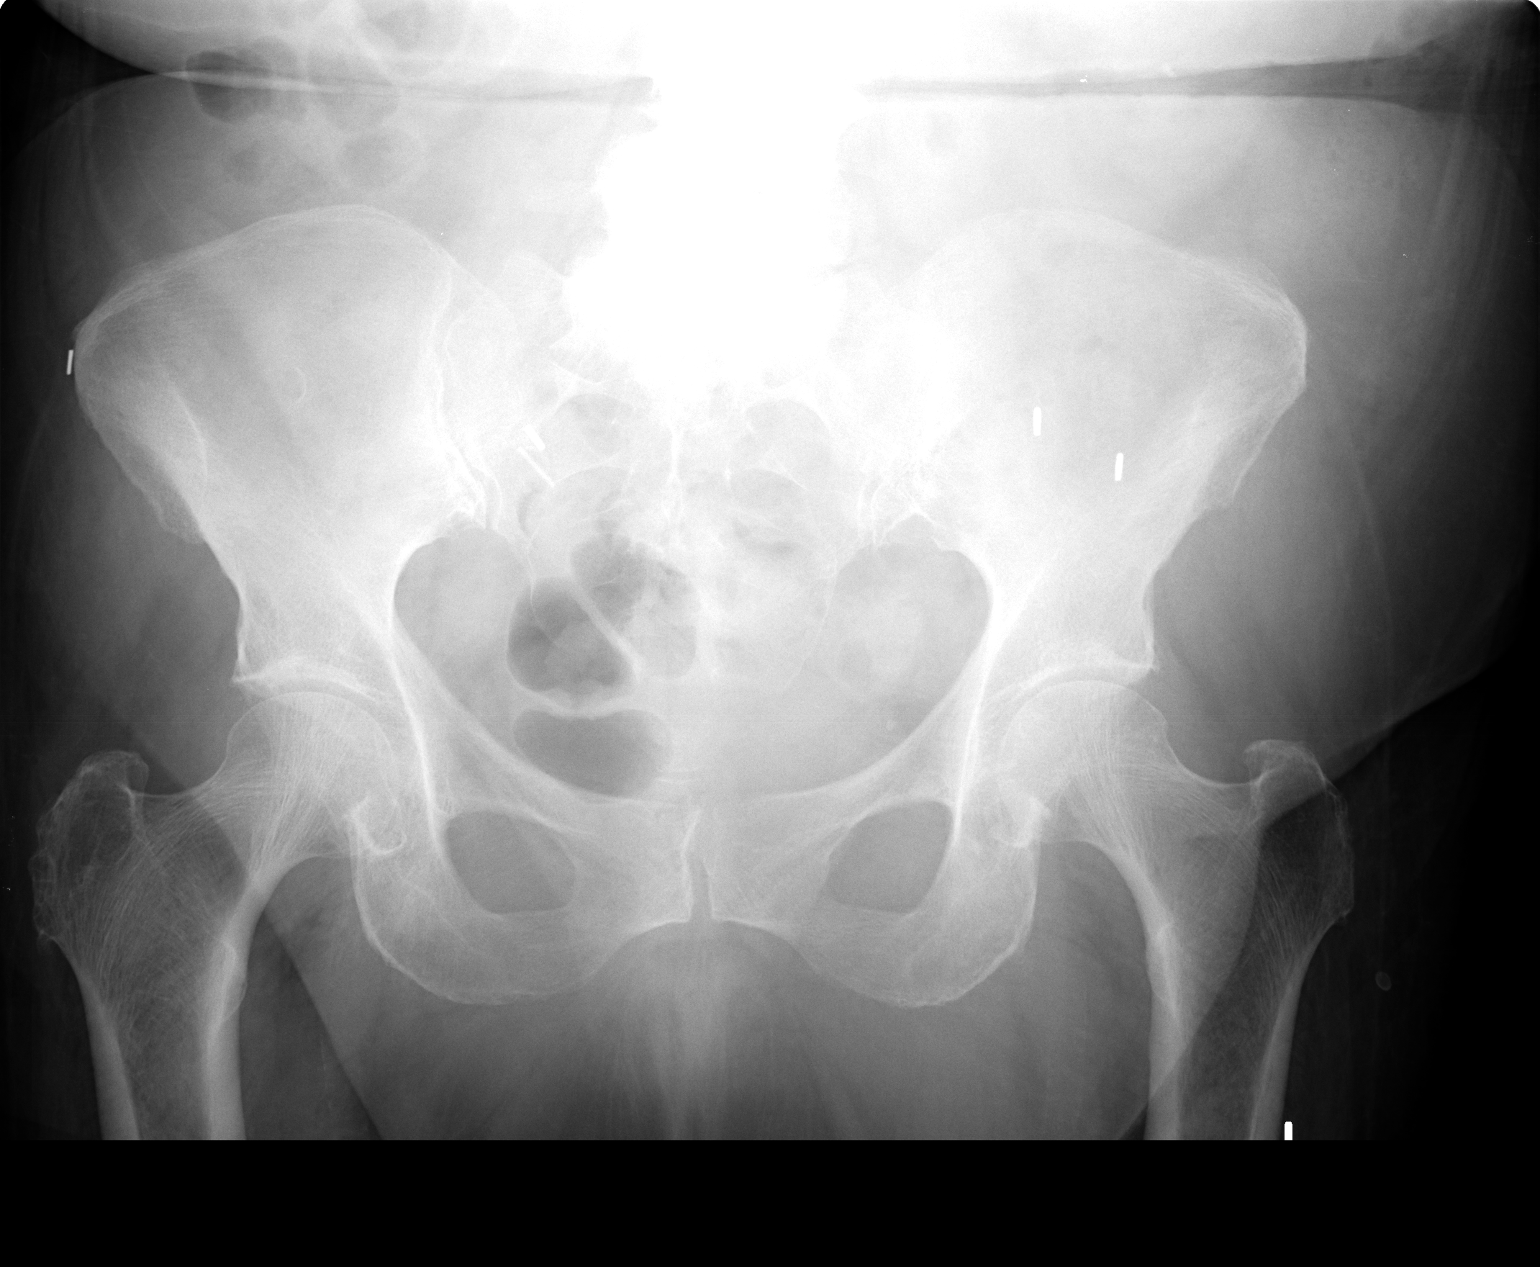

[1 of 1 positions shown; findings below may reference images not displayed]

FINDINGS: No change in levoscoliosis and degenerative disc vertebral changes inferior lumbar spine and pelvic surgical clips. Bony pelvis, bilateral hips and sacroiliac joints are stable with no acute fracture subluxation.
IMPRESSION: No acute abnormality.

## 2007-05-20 IMAGING — CR DG LUMBAR SPINE COMPLETE 4+V
5 series · 5 of 5 positions shown · non-contrast
Comparison: [HOSPITAL] pelvic radiograph 01/02/06 and [HOSPITAL] abdominal/pelvic CT 05/04/04.

CLINICAL DATA: Post fall with low back, right hip sacral pain.  
LUMBAR SPINE ? 4 VIEWS:

[view not recorded (1 of 5)]
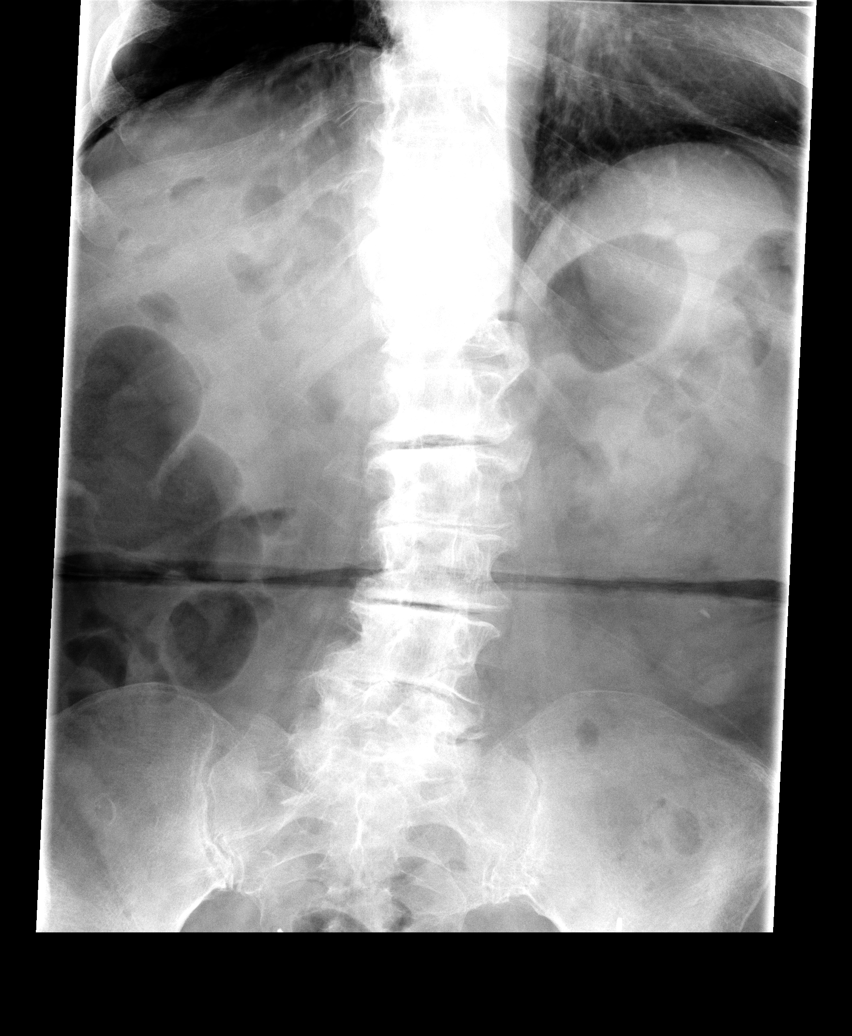

[view not recorded (2 of 5)]
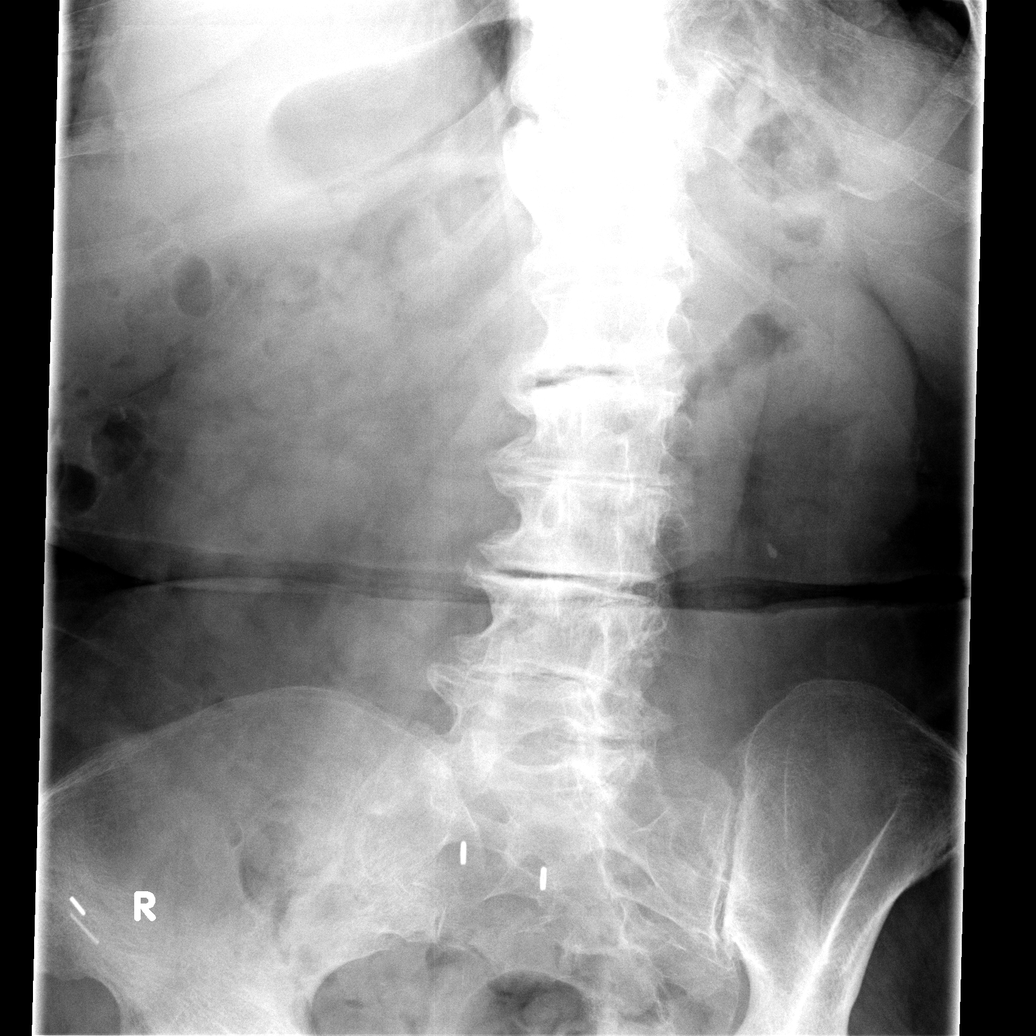

[view not recorded (3 of 5)]
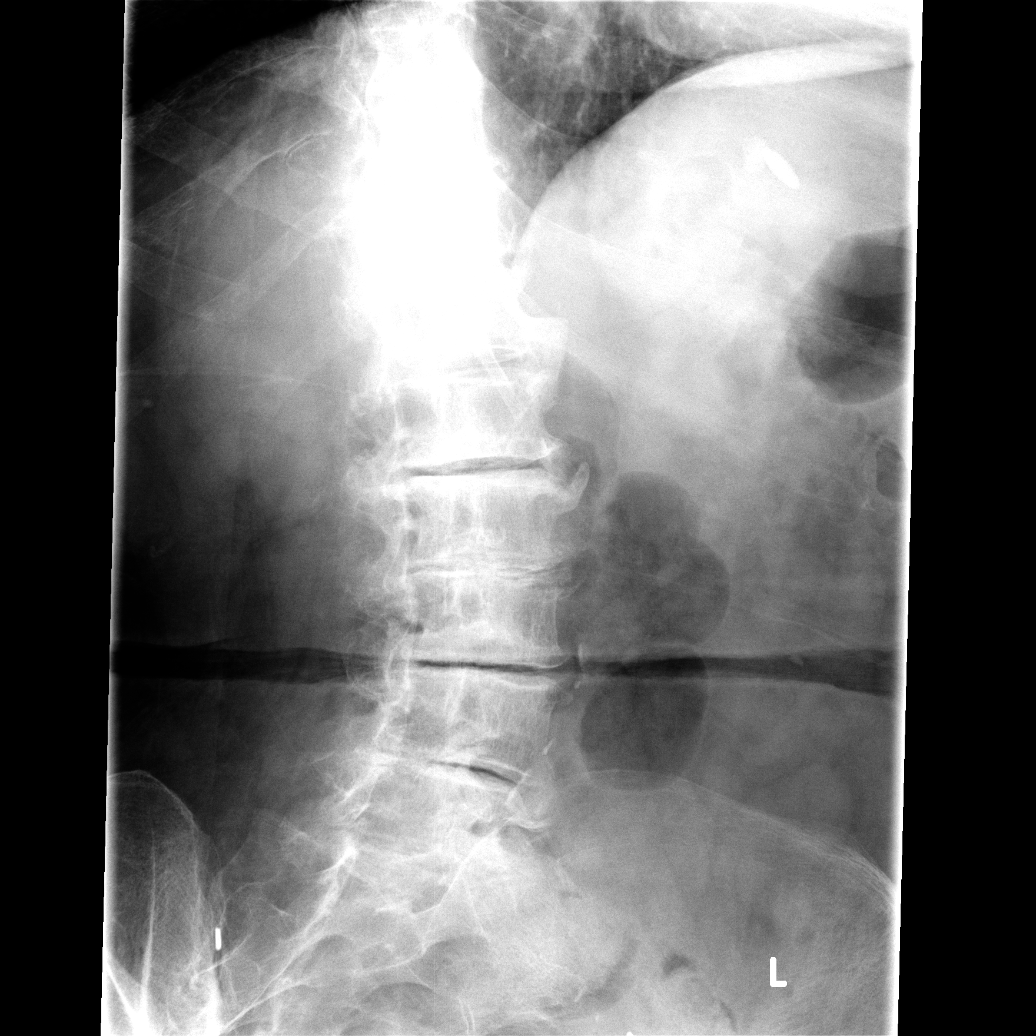

[view not recorded (4 of 5)]
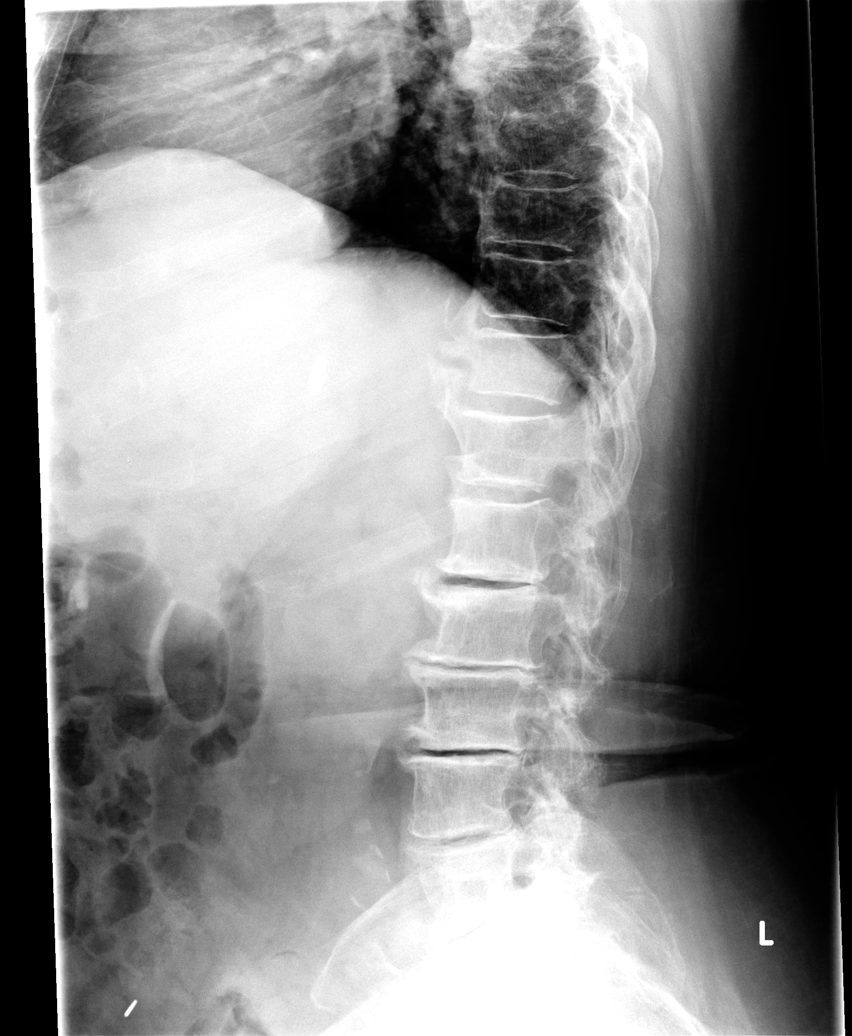

[view not recorded (5 of 5)]
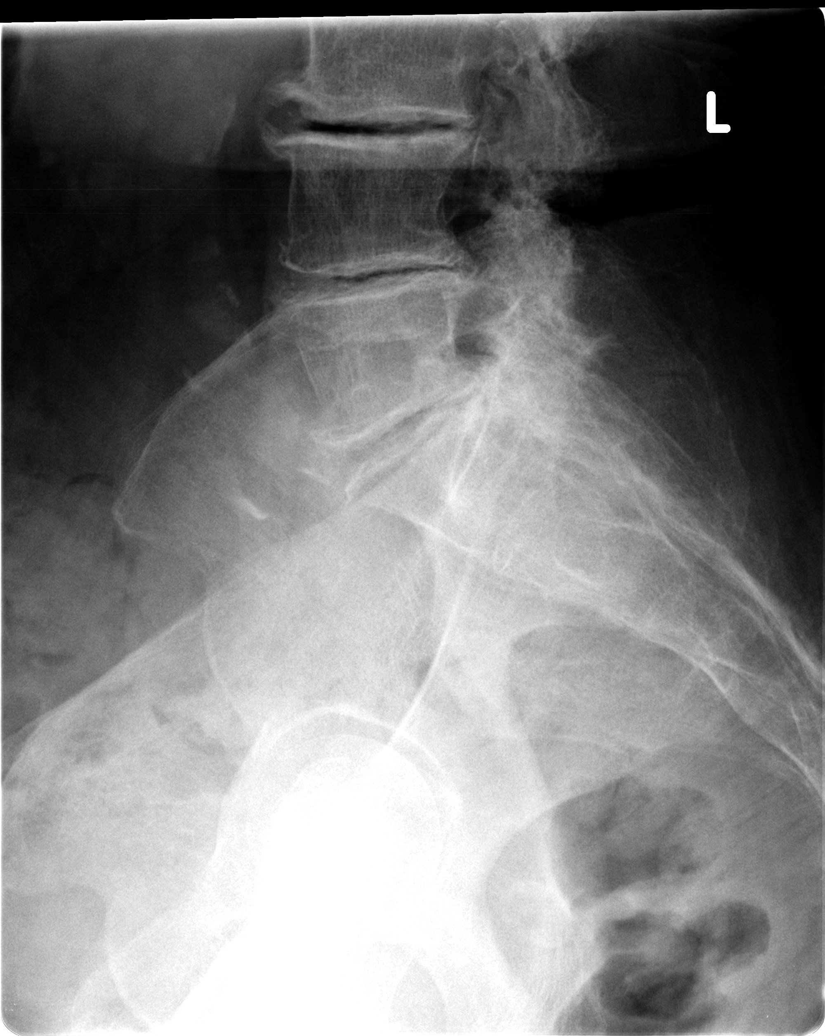

[5 of 5 positions shown; findings below may reference images not displayed]

FINDINGS: Five non-rib-bearing lumbar vertebrae are seen with stable multilevel advanced degenerative disc disease, degenerative spondylosis from L1-2 through L5-S1.  No acute fracture is seen with normal posterior vertebral alignment.  Advanced left L5-S1 and lesser L4-5 bilateral facet degenerative joint disease is seen.  Diffuse osteopenia is noted.
IMPRESSION: 1.  No acute fracture or subluxation. 
2.  Stable levo scoliosis, diffuse osteopenia, and advanced degenerative disc and vertebral changes thoracolumbar spine.

## 2007-05-20 IMAGING — CT CT HEAD W/O CM
1 series · 16 of 30 positions shown, 20 images · IV contrast (agent unspecified)
Comparison: none

CLINICAL DATA: Fell and hit head. 
 HEAD CT WITHOUT CONTRAST:
TECHNIQUE: Contiguous axial images were obtained from the base of the skull through the vertex according to standard protocol without contrast.
 No comparison. 
 The ventricles are not enlarged.  There is no intracranial hemorrhage.   There is mild chronic ischemic change in the white matter bilaterally.   There is no acute infarct or mass. There is no skull fracture.

[Series 2: head_seq 5.0 h45s · axial · 0.43mm/px · z∈[+1308,+1443]mm · 16 of 30 slices shown, 20 images]
[im 2/30  brain]
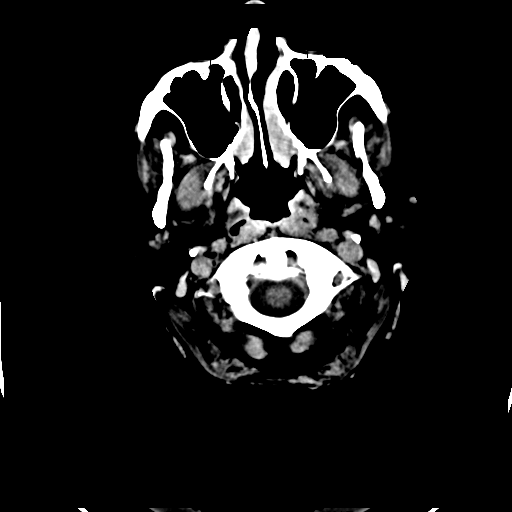
[im 2/30  bone]
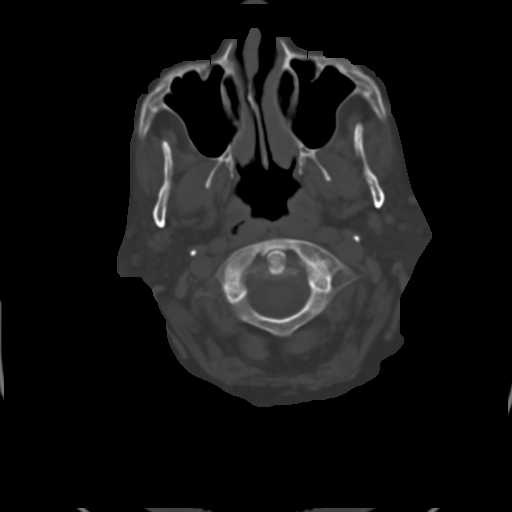
[im 4/30  brain]
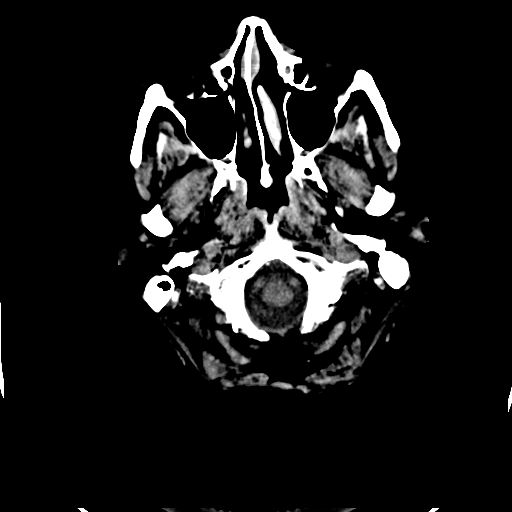
[im 6/30  brain]
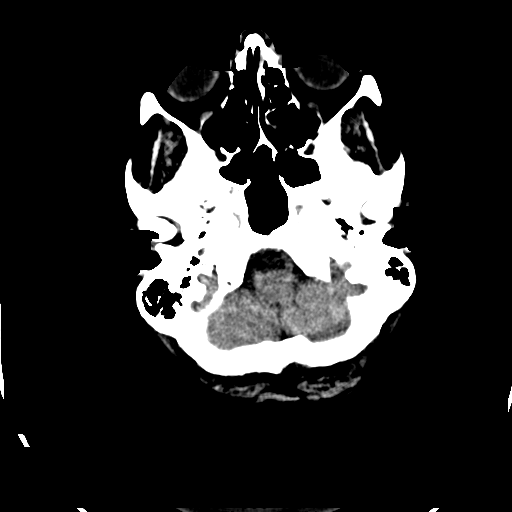
[im 8/30  brain]
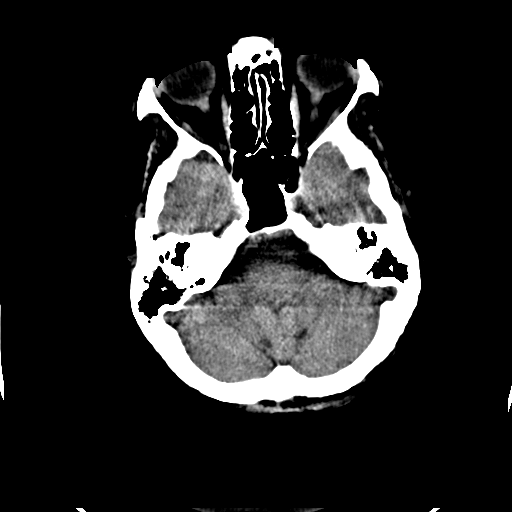
[im 9/30  brain]
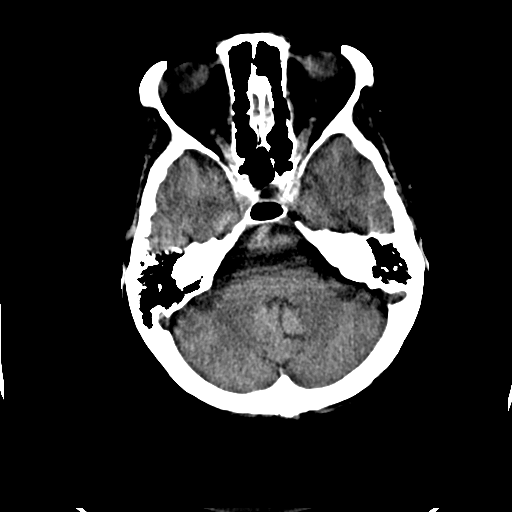
[im 9/30  bone]
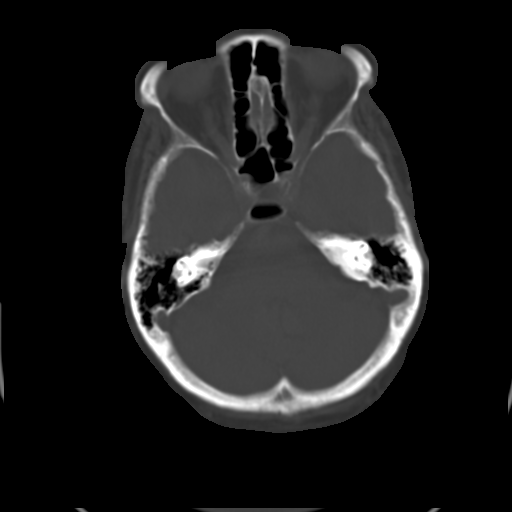
[im 11/30  brain]
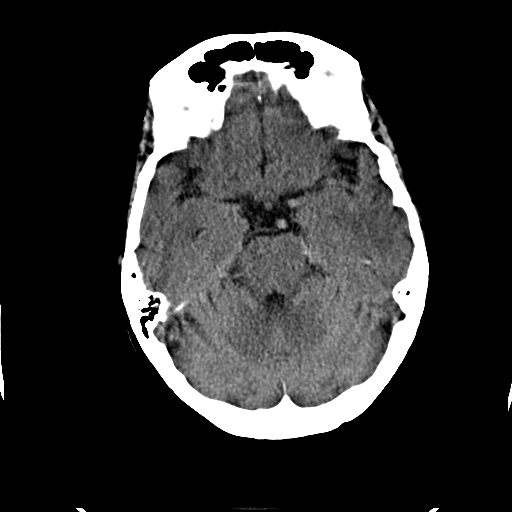
[im 13/30  brain]
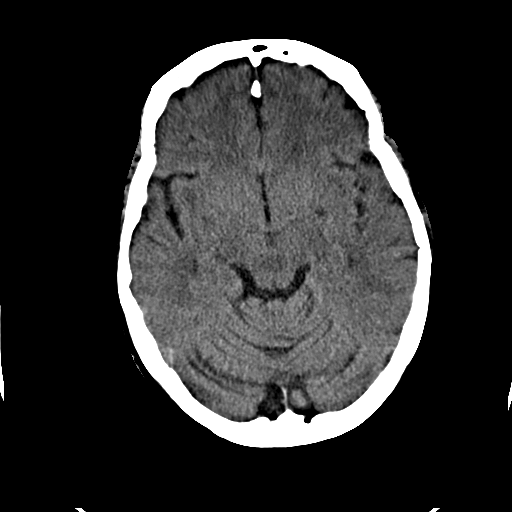
[im 15/30  brain]
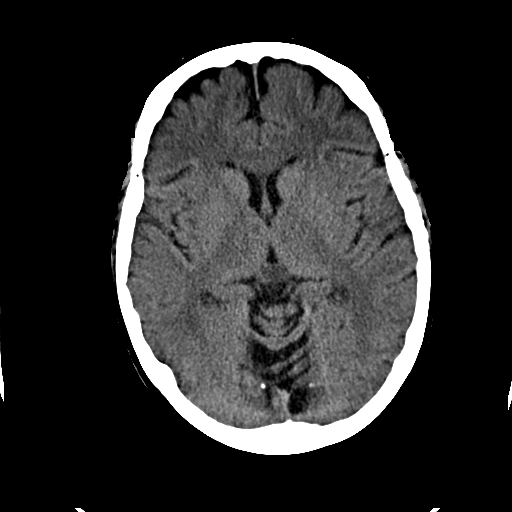
[im 16/30  brain]
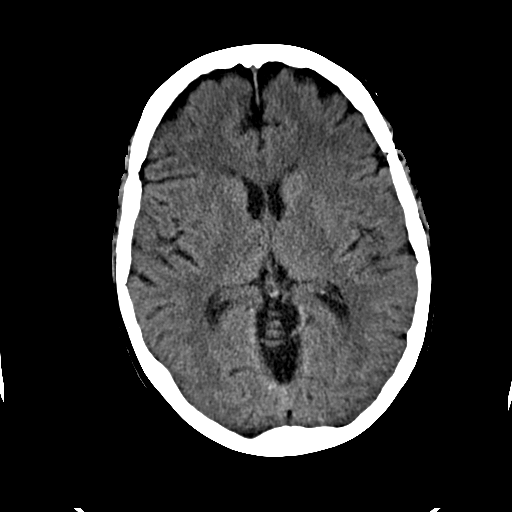
[im 16/30  bone]
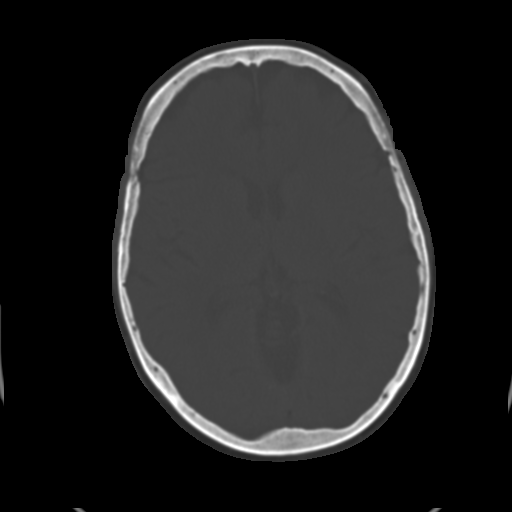
[im 18/30  brain]
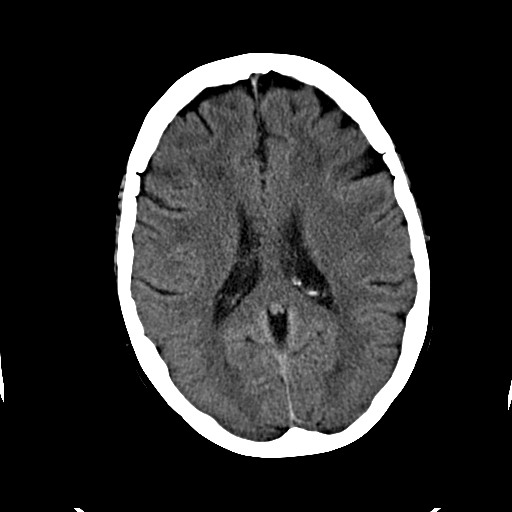
[im 20/30  brain]
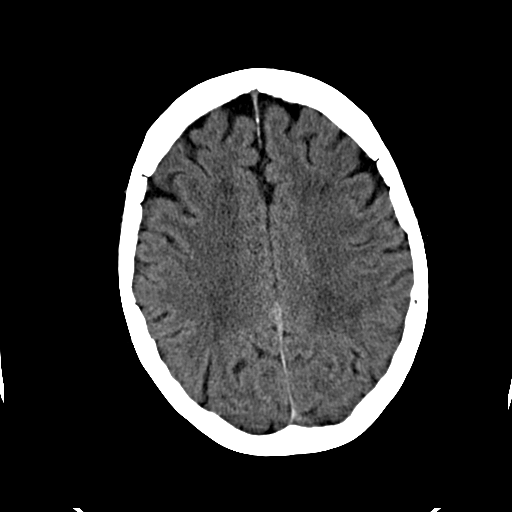
[im 22/30  brain]
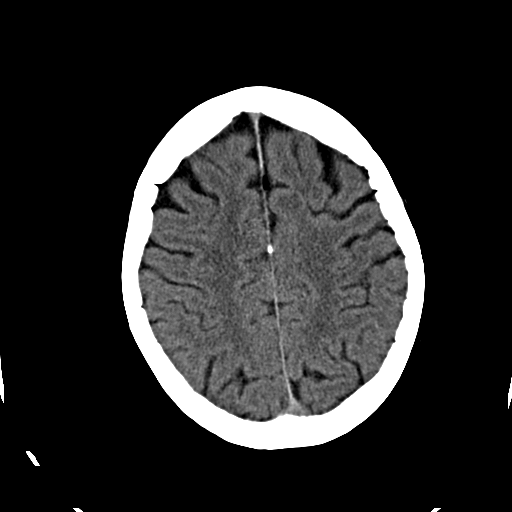
[im 23/30  brain]
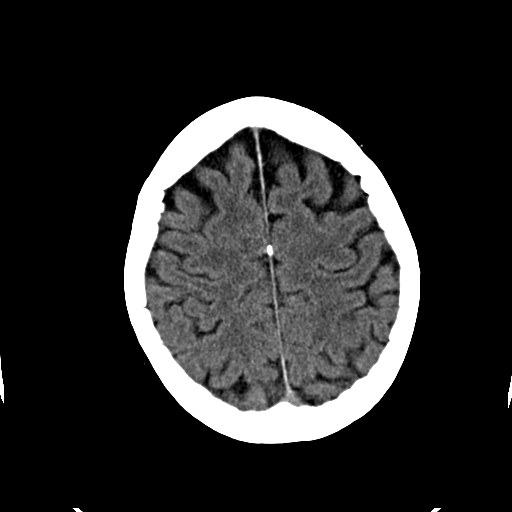
[im 23/30  bone]
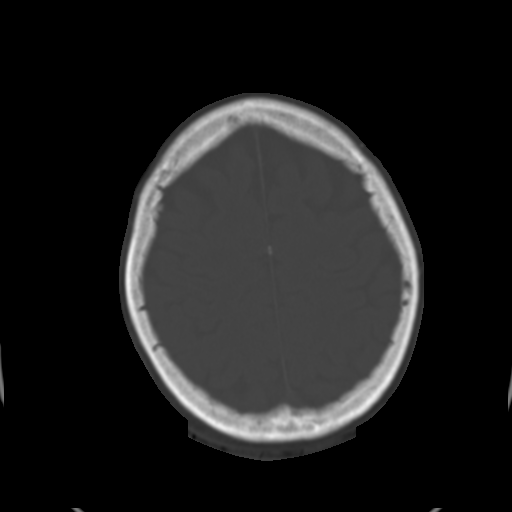
[im 25/30  brain]
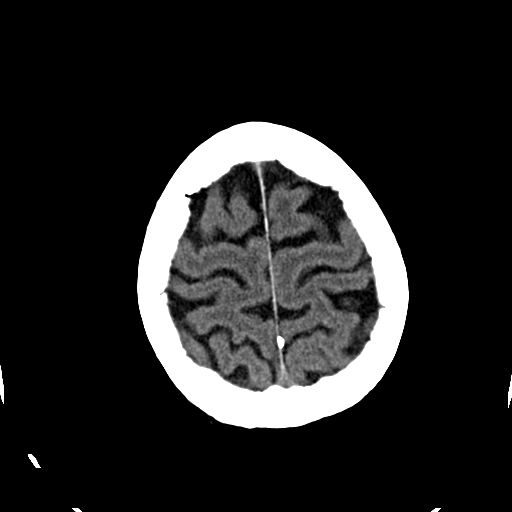
[im 27/30  brain]
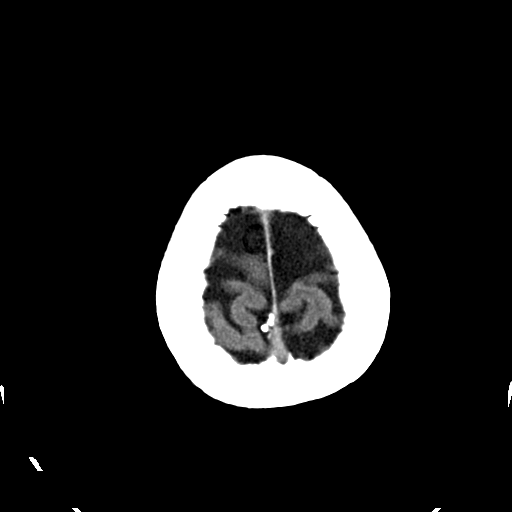
[im 29/30  brain]
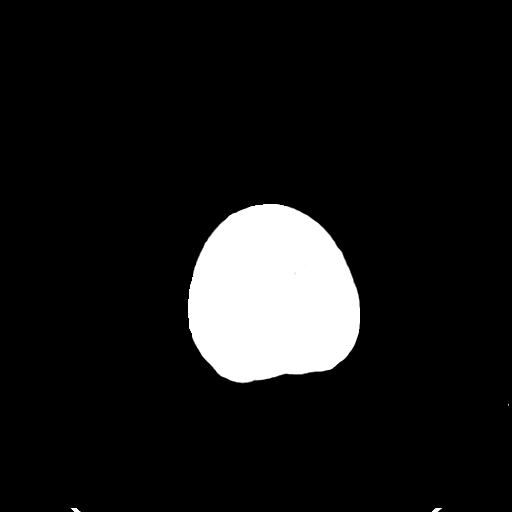

[16 of 30 positions shown; findings below may reference images not displayed]

IMPRESSION: Chronic small vessel ischemic change.  No acute abnormality.

## 2007-06-23 ENCOUNTER — Encounter: Admission: RE | Admit: 2007-06-23 | Discharge: 2007-06-23 | Payer: Self-pay | Admitting: Obstetrics and Gynecology

## 2007-12-21 ENCOUNTER — Encounter: Admission: RE | Admit: 2007-12-21 | Discharge: 2007-12-21 | Payer: Self-pay | Admitting: Obstetrics and Gynecology

## 2008-05-04 IMAGING — MG MM SCREEN MAMMOGRAM BILATERAL
5 series · 5 of 5 positions shown · non-contrast
Comparison: none

DG SCREEN MAMMOGRAM BILATERAL
Bilateral CC and MLO view(s) were taken.

DIGITAL SCREENING MAMMOGRAM WITH CAD:
There is a  fatty fibroglandular pattern.  A possible mass is noted in the right breast.  Spot 
compression views and possibly sonography are recommended for further evaluation.  In the left 
breast, no masses or malignant type calcifications are identified.  Compared with prior studies.

[R CC]
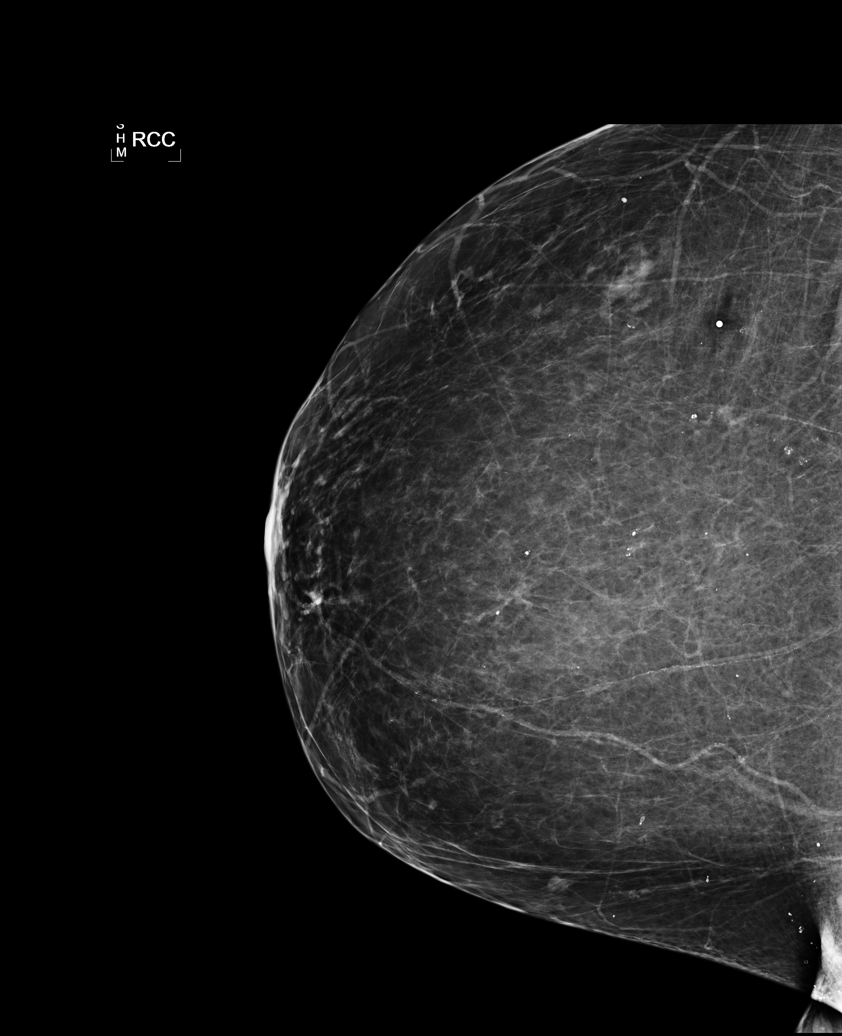

[L CC]
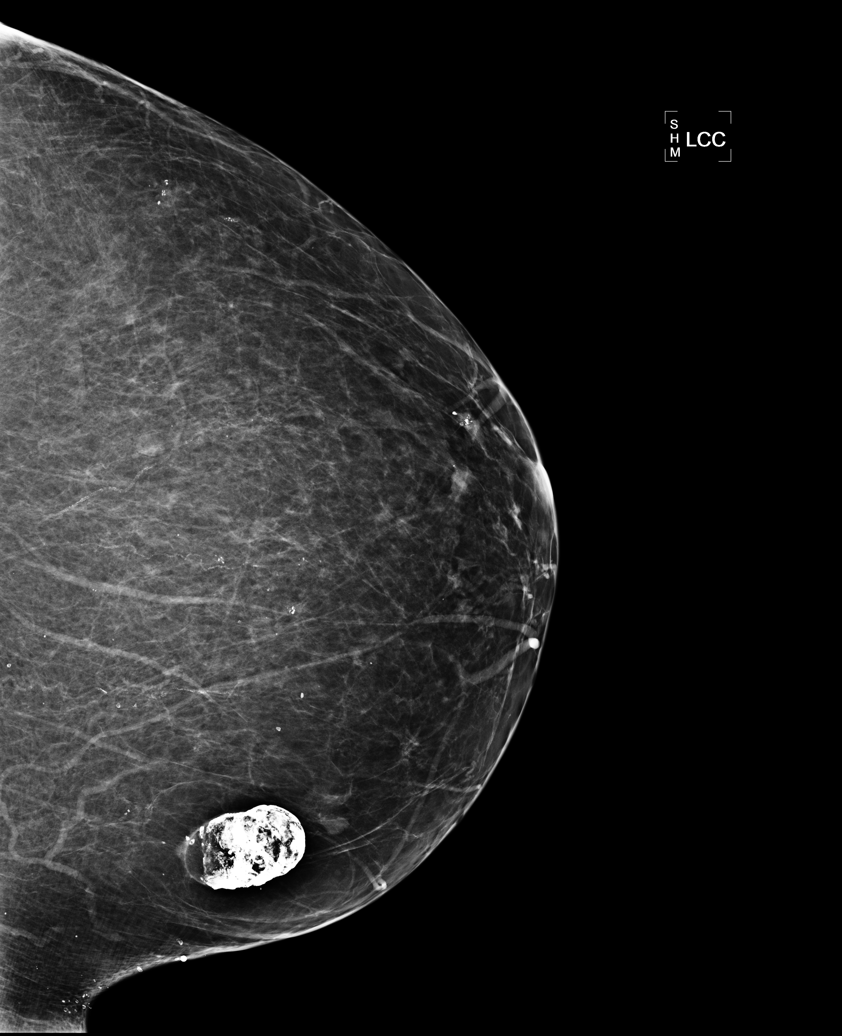

[L MLO (1 of 2)]
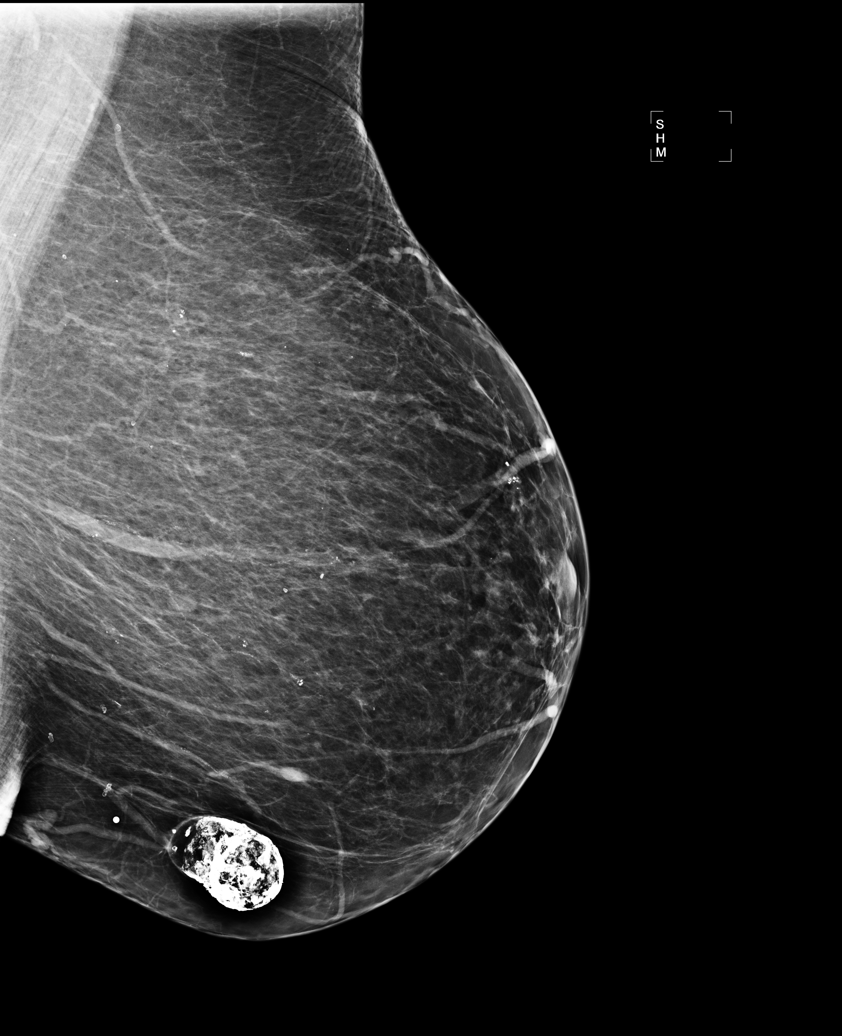

[R MLO]
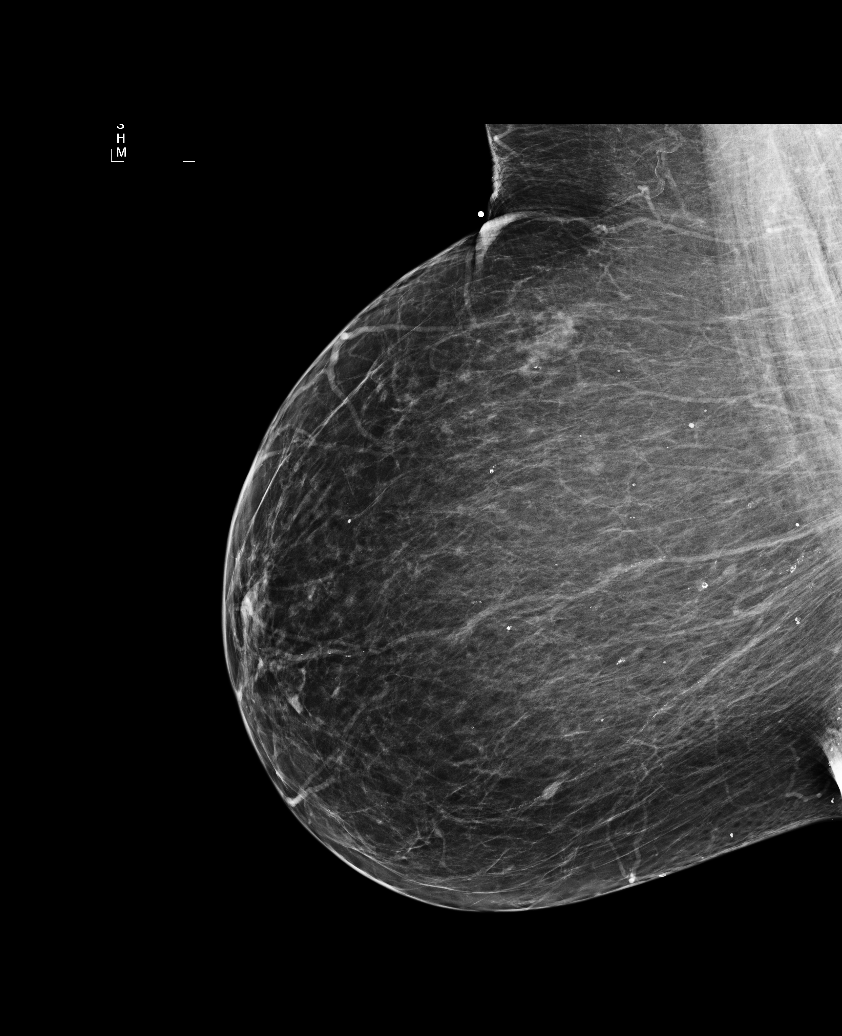

[L MLO (2 of 2)]
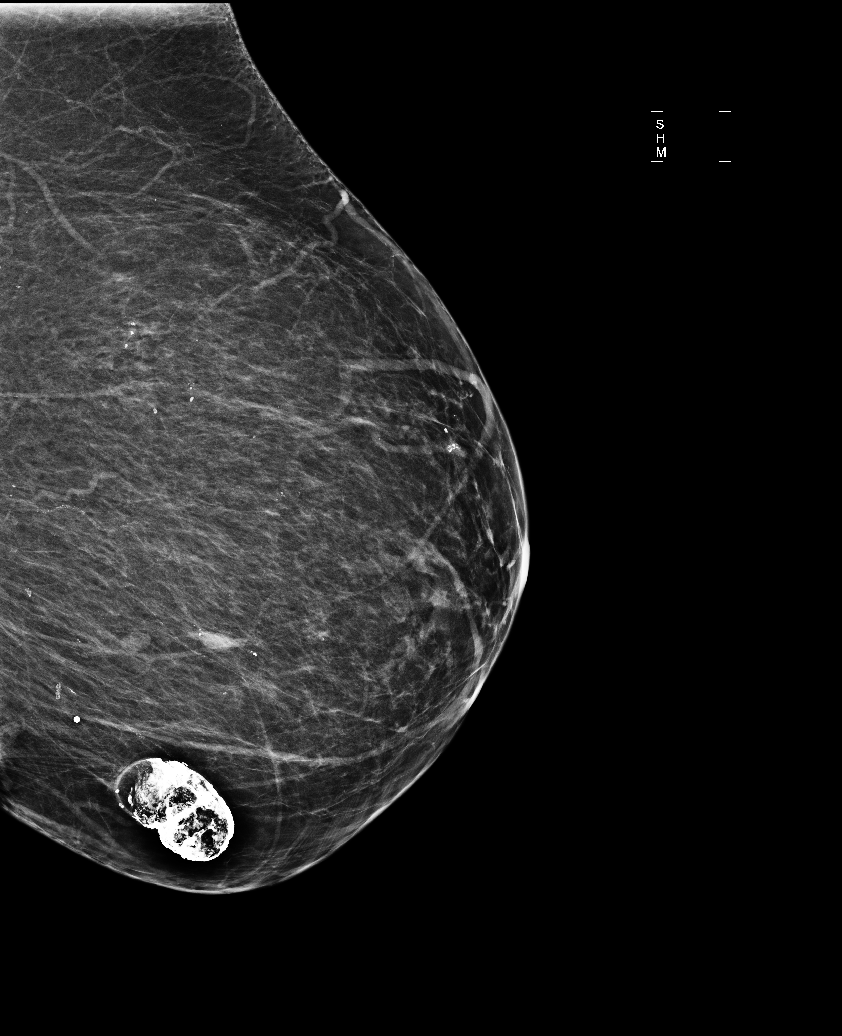

[5 of 5 positions shown; findings below may reference images not displayed]

IMPRESSION: Possible mass, right breast.  Additional evaluation is indicated.  The patient will be contacted 
for additional studies and a supplementary report will follow.  No specific mammographic evidence 
of malignancy, left breast.

ASSESSMENT: Need additional imaging evaluation and/or prior mammograms for comparison - BI-RADS 0

Further imaging of the right breast.
ANALYZED BY COMPUTER AIDED DETECTION. , THIS PROCEDURE WAS A DIGITAL MAMMOGRAM.

## 2008-05-15 IMAGING — MG MM DIAGNOSTIC LTD RIGHT
3 series · 3 of 3 positions shown · non-contrast
Comparison: none

[REDACTED] RIGHT
CC and MLO view(s) were taken of the right breast.

DIGITAL RIGHT LIMITED DIAGNOSTIC MAMMOGRAM:
CLINICAL DATA: Abnormal screening study.
Spot compression views of the lower inner quadrant of the right breast reveals a small low density 
nodule that is felt likely to be benign.  There is no associated malignant-type 
microcalcifications.  The margins are smooth.  Comparison is made to prior exams dated 12/18/06 and
12/07/04.

[R CC]
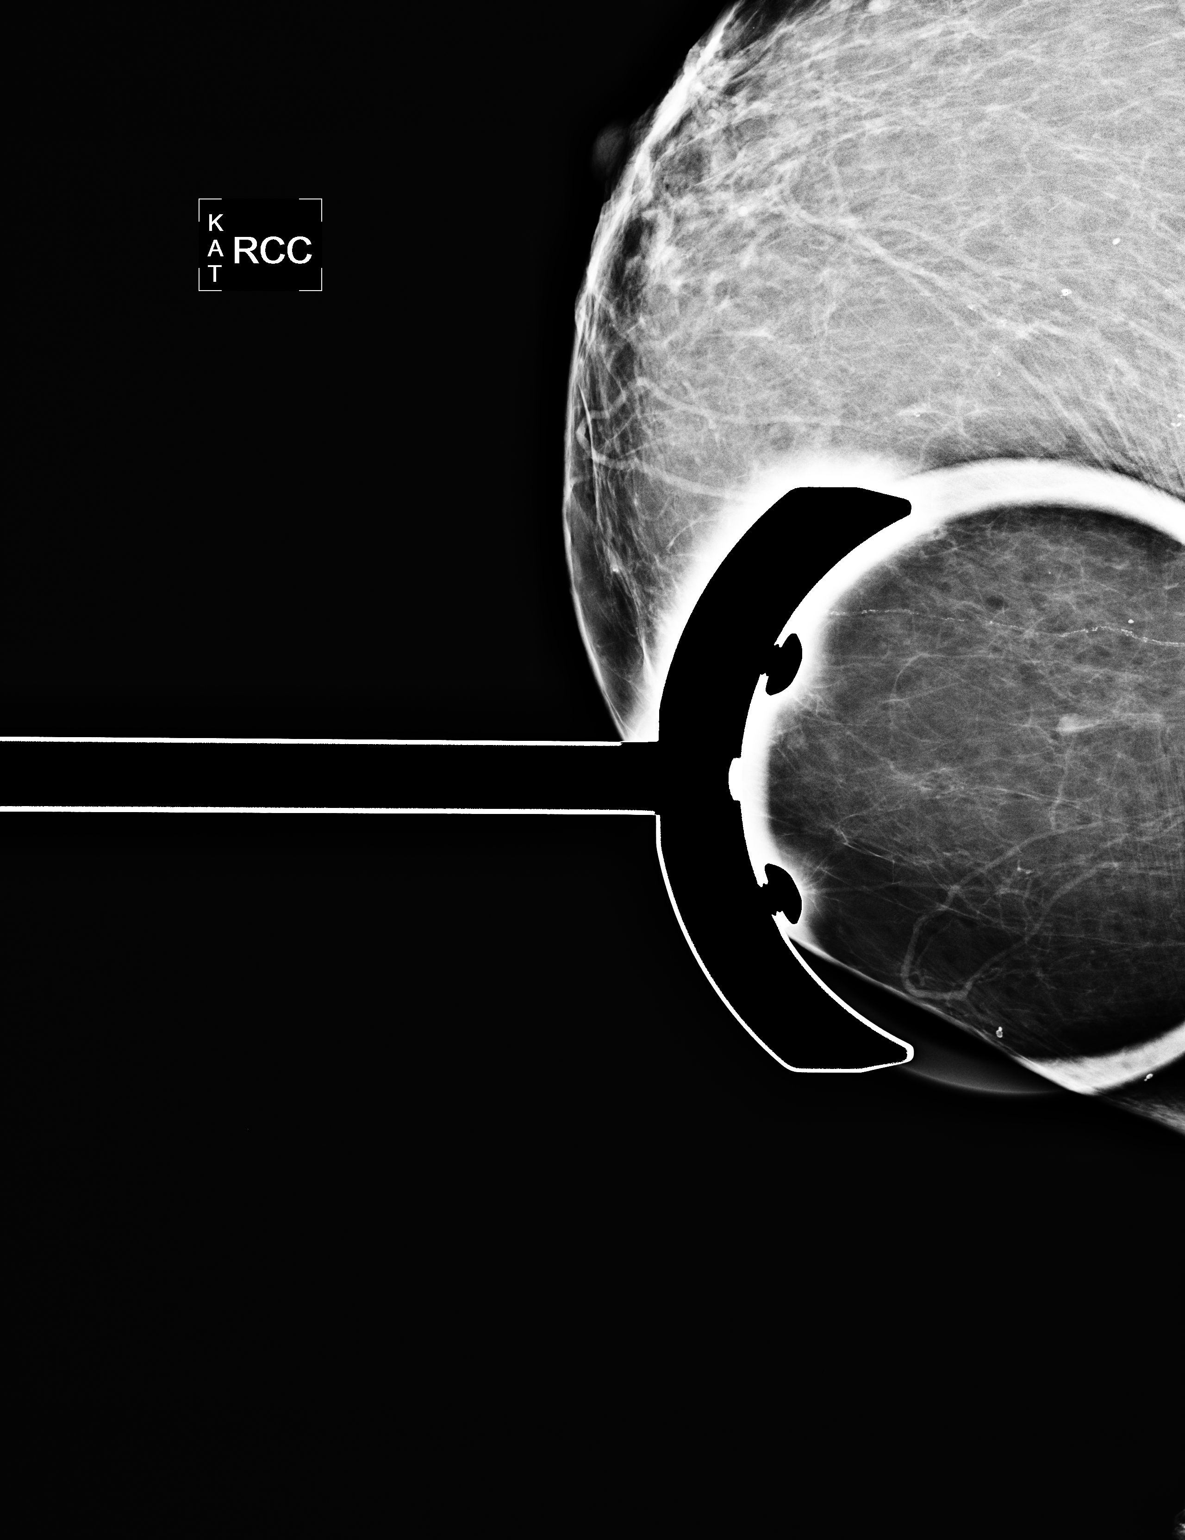

[R MLO (1 of 2)]
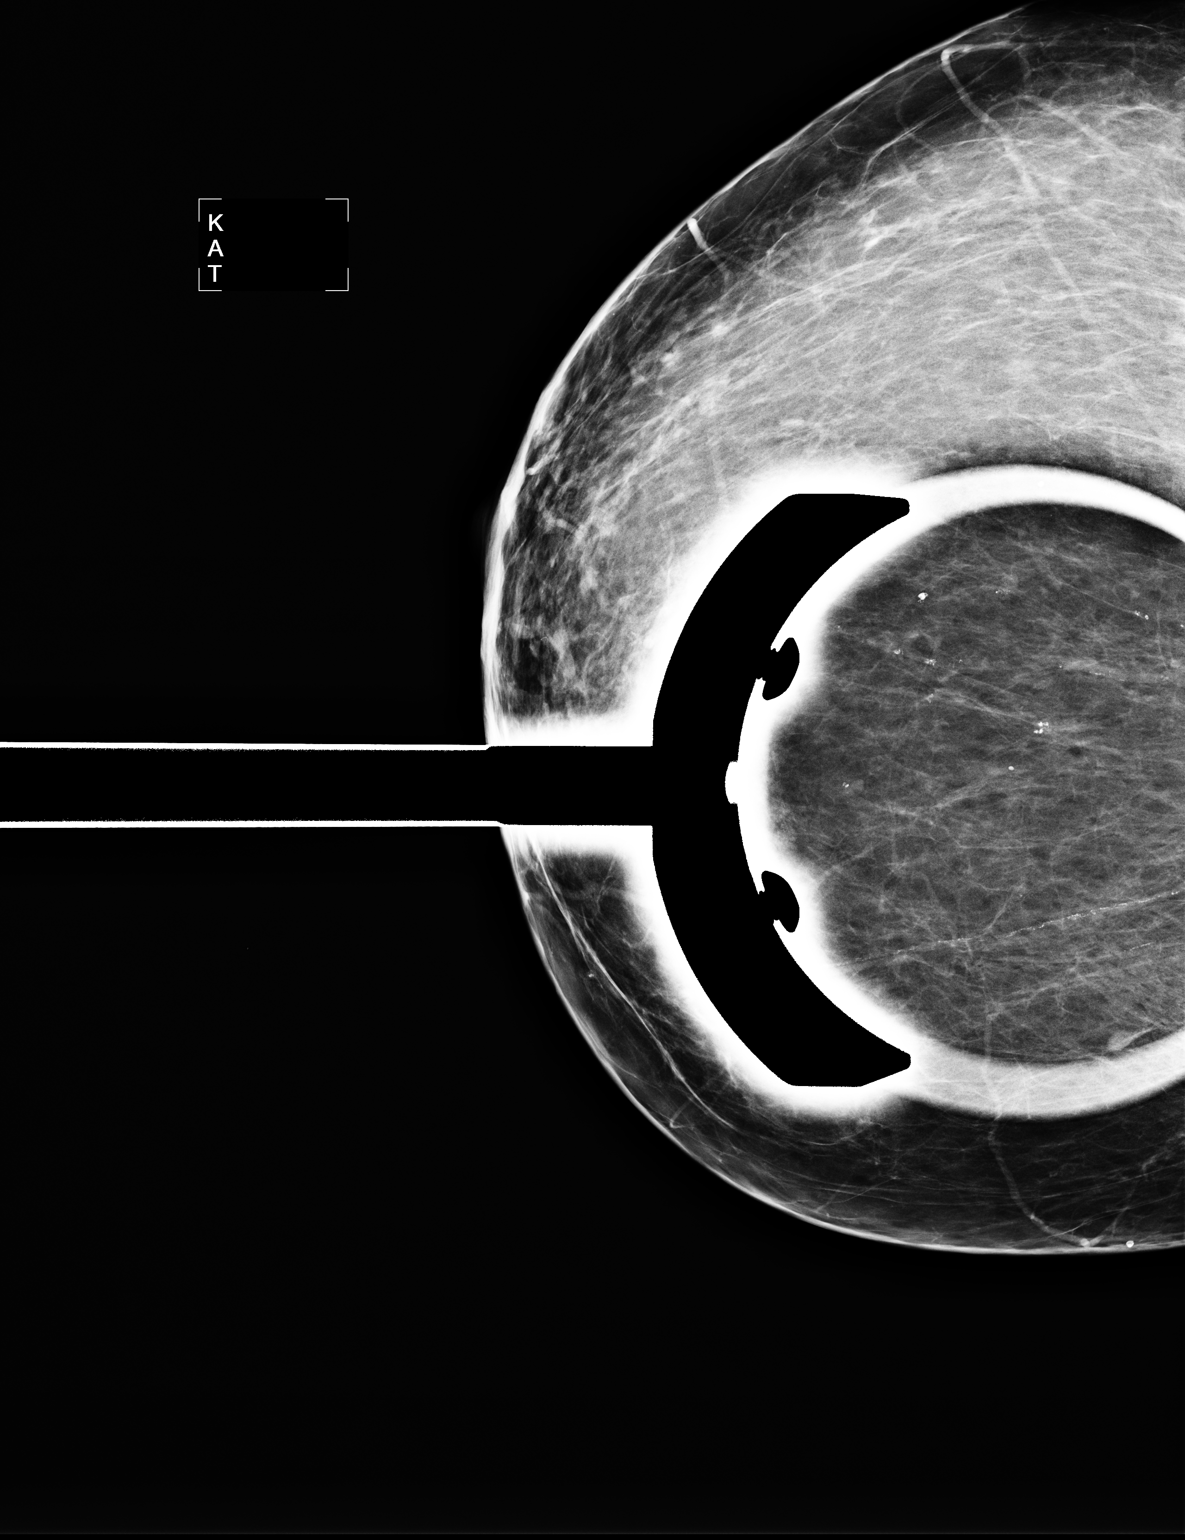

[R MLO (2 of 2)]
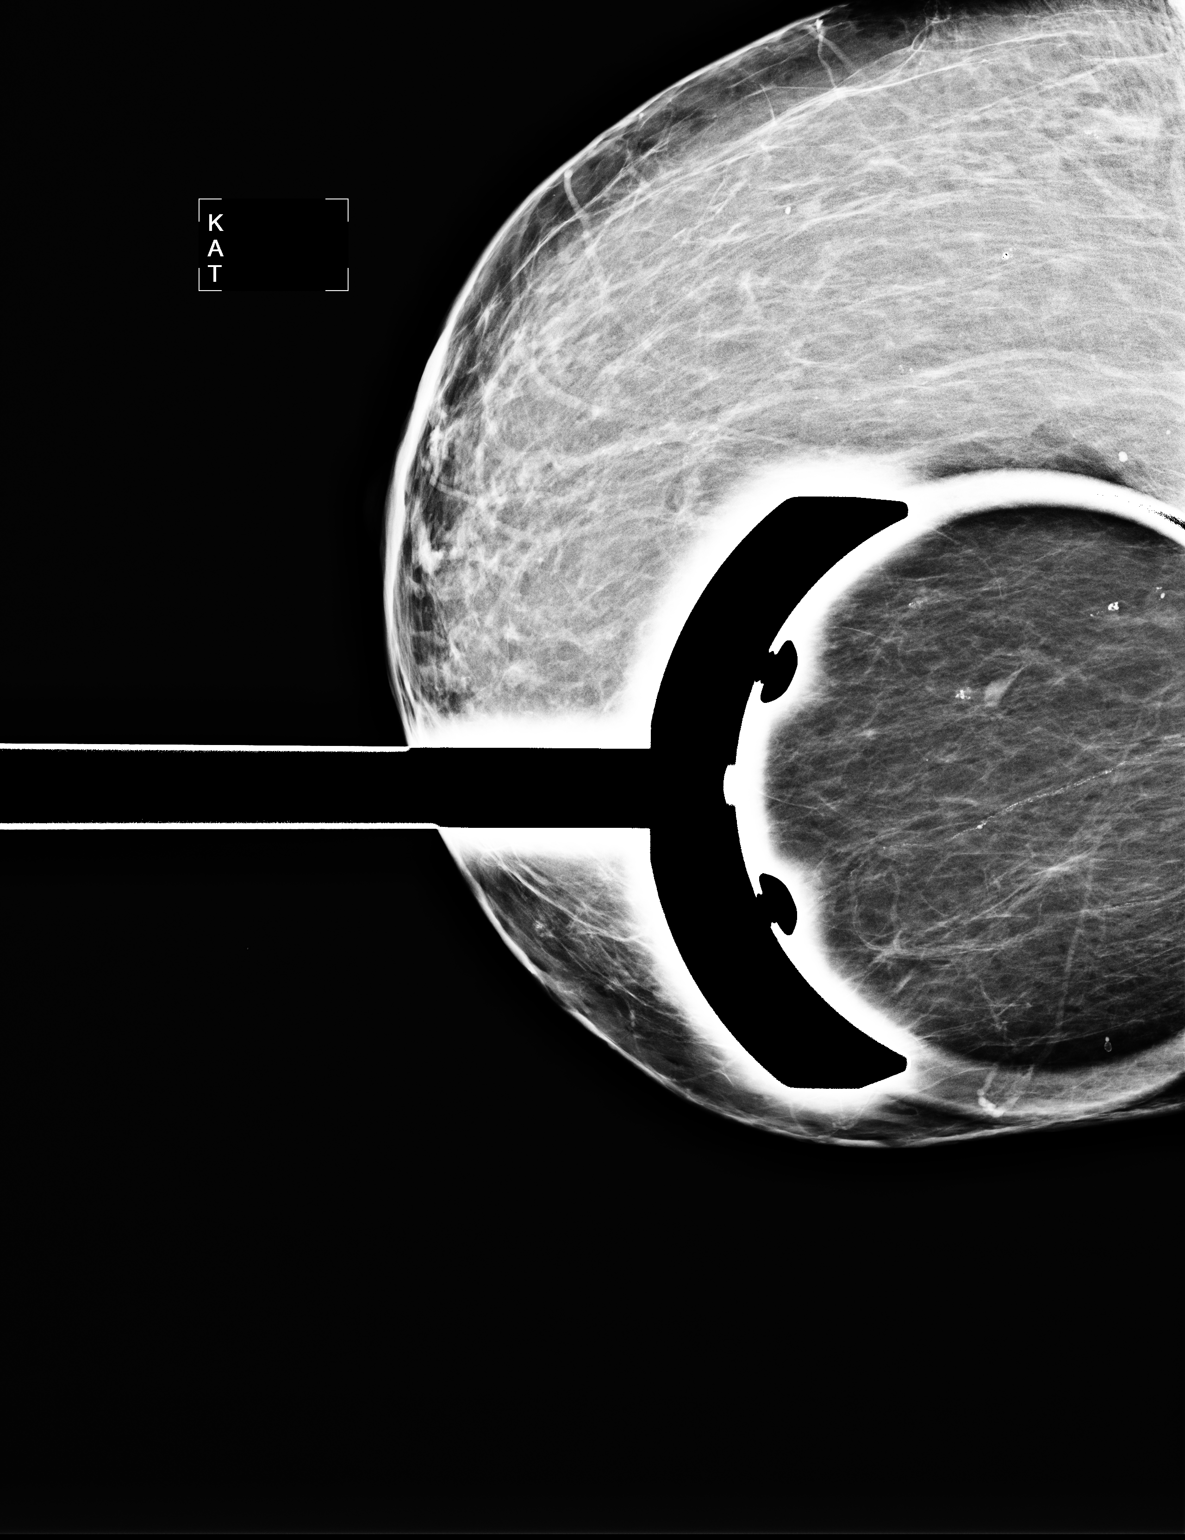

[3 of 3 positions shown; findings below may reference images not displayed]

IMPRESSION: Probable benign mass, right breast.  Short-term interval follow-up mammogram in 6 months is 
recommended.

ASSESSMENT: Probably benign - BI-RADS 3

Mammogram of the right breast in 6 months.
, THIS PROCEDURE WAS A DIGITAL MAMMOGRAM.

## 2008-11-07 IMAGING — MG MM DIAGNOSTIC UNILATERAL R
4 series · 4 of 4 positions shown · non-contrast
Comparison: Previous examinations.

DG DIAGNOSTIC UNILATERAL R
CC and MLO view(s) were taken of the right breast.

DIGITAL UNILATERAL RIGHT DIAGNOSTIC MAMMOGRAM WITH CAD:
CLINICAL DATA: Follow-up right breast mass.

[R CC (1 of 2)]
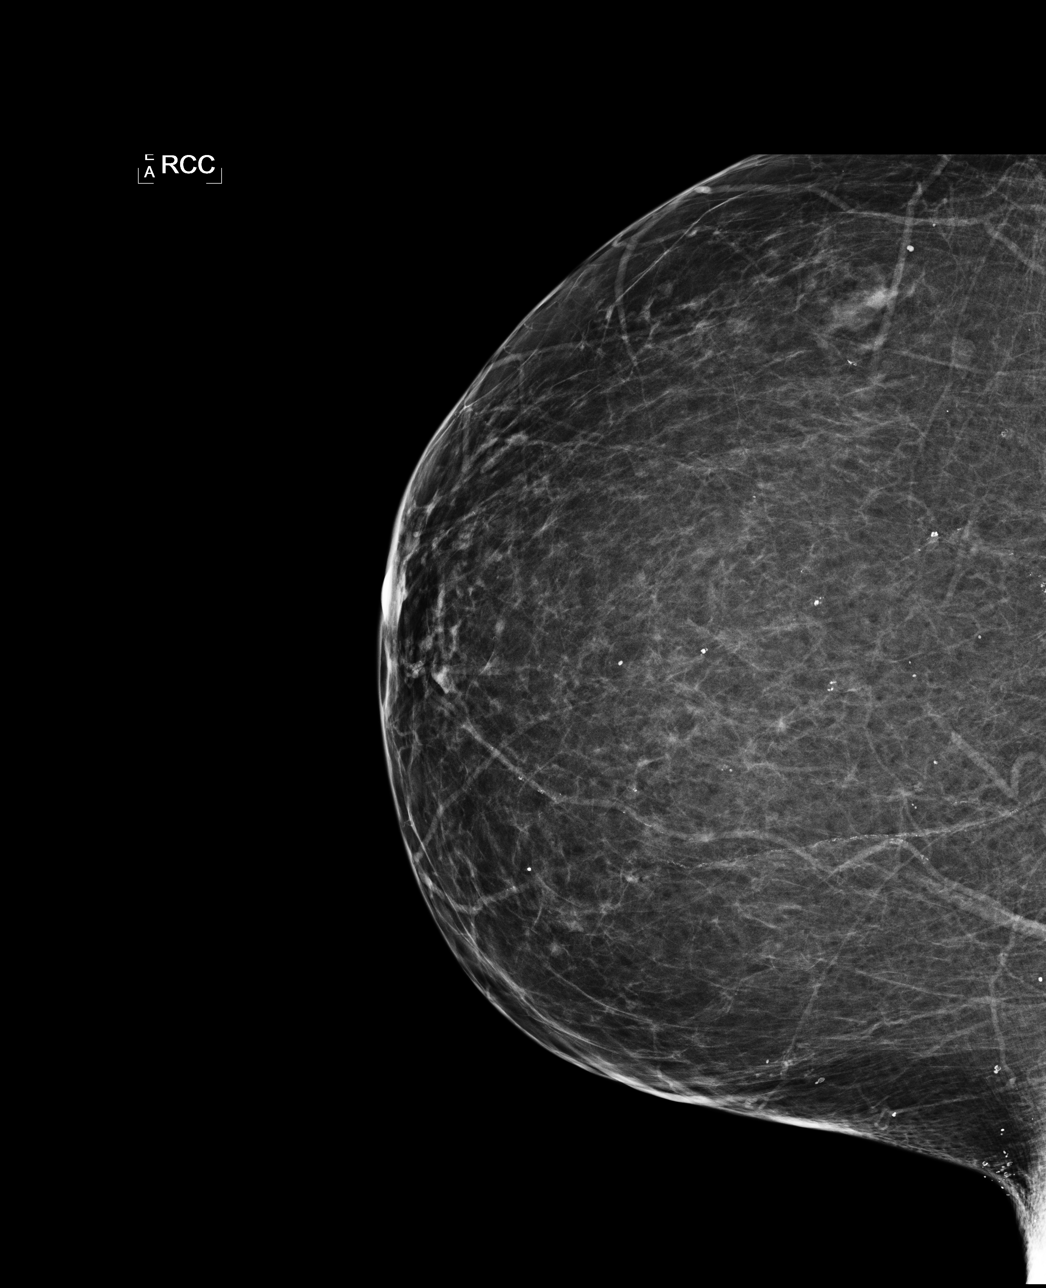

[R MLO (1 of 2)]
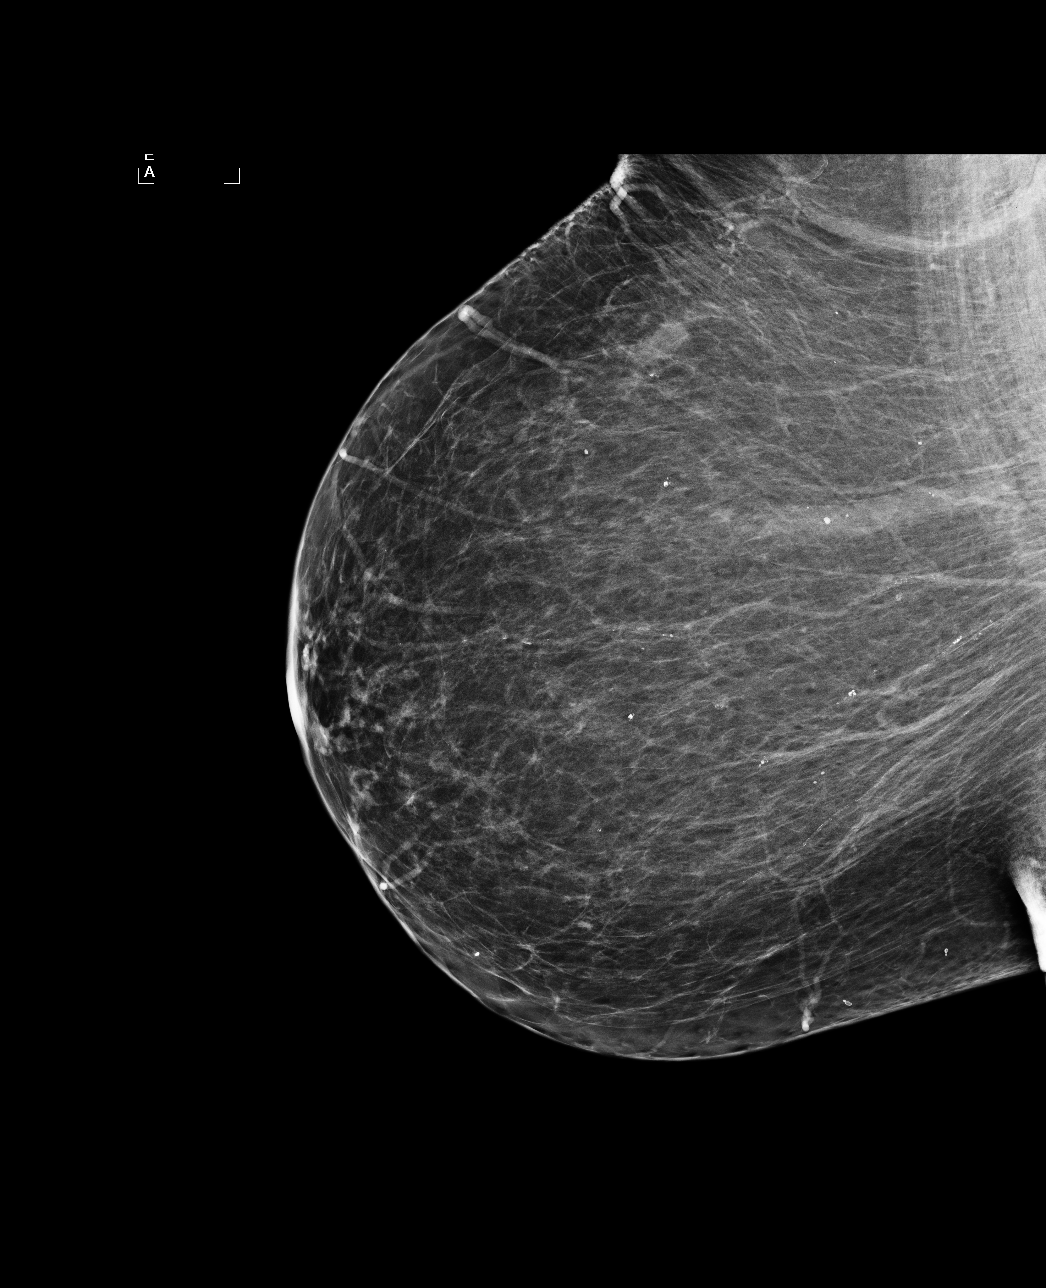

[R CC (2 of 2)]
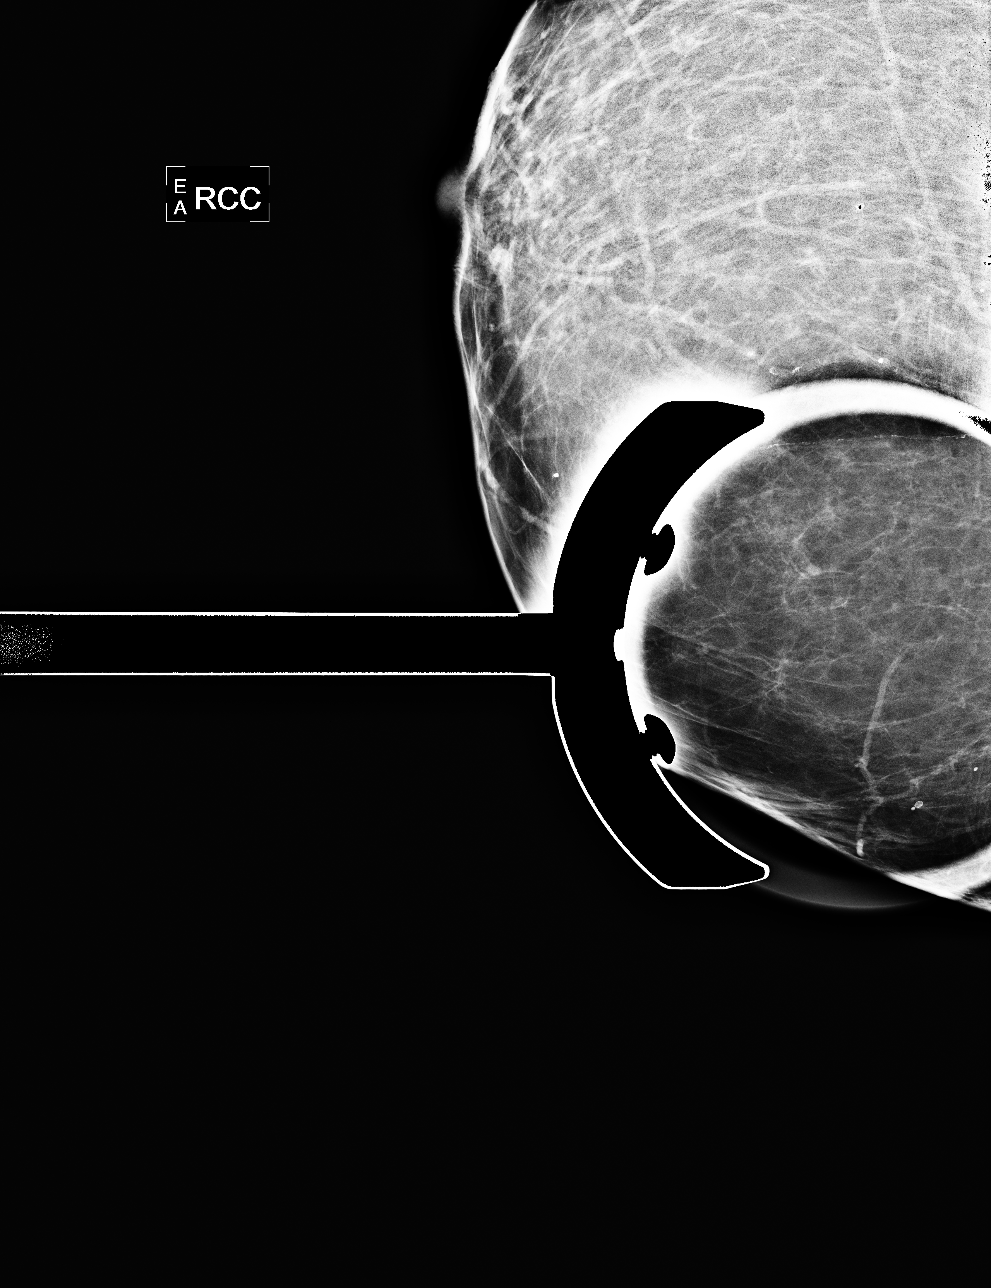

[R MLO (2 of 2)]
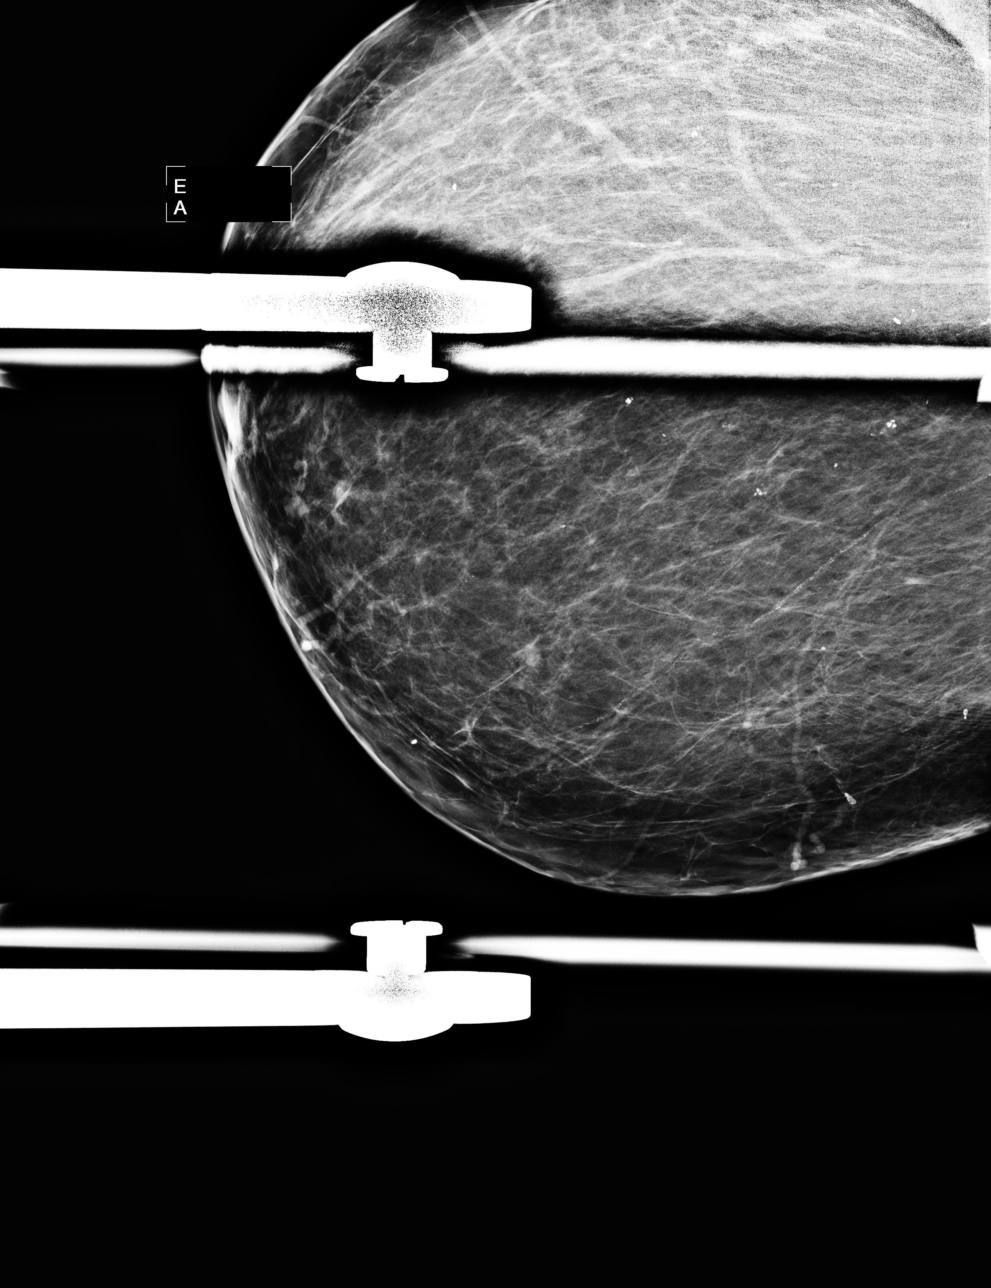

[4 of 4 positions shown; findings below may reference images not displayed]

Mammographic views of the right breast demonstrate absence of the right breast nodule seen on 
12/29/06 and 12/18/06.  A fibrofatty pattern in the breast is stable with no new findings 
suspicious for malignancy.
IMPRESSION: Resolved right breast cyst.  Annual screening mammography is recommended.  The patient will be due 
for her next screening mammogram in six months.

ASSESSMENT: Negative - BI-RADS 1

Screening mammogram of both breasts in 6 months.
, THIS PROCEDURE WAS A DIGITAL MAMMOGRAM.

## 2008-12-21 ENCOUNTER — Encounter: Admission: RE | Admit: 2008-12-21 | Discharge: 2008-12-21 | Payer: Self-pay | Admitting: Obstetrics and Gynecology

## 2009-05-07 IMAGING — MG MM SCREEN MAMMOGRAM BILATERAL
2 series · 2 of 2 positions shown · non-contrast
Comparison: Prior studies.

DG SCREEN MAMMOGRAM BILATERAL
Bilateral CC and MLO view(s) were taken.
Prior study comparison: June 23, 2007, right breast DG diagnostic unilateral R.  December 29, 2006,
[REDACTED] right.  December 18, 2006, dG screen mammogram bilateral.  December 17, 2005, 
bilateral screening mammogram.

DIGITAL SCREENING MAMMOGRAM WITH CAD:

[L CC]
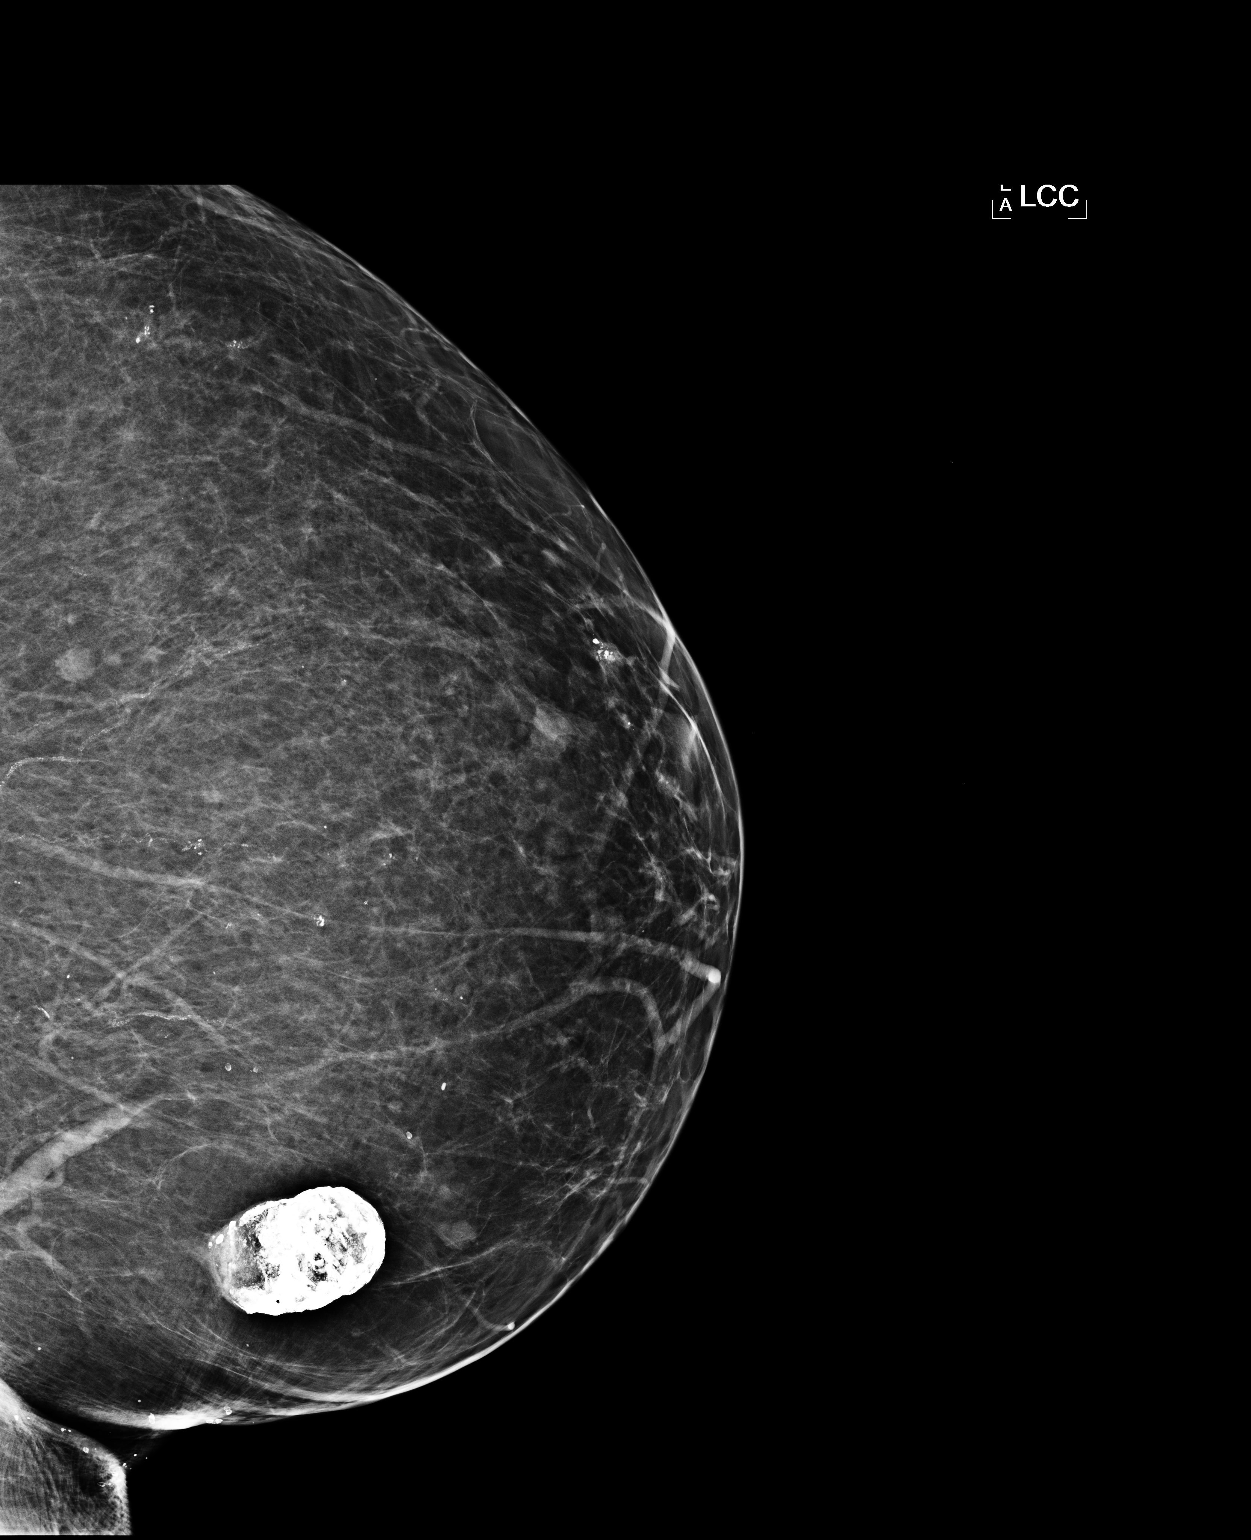

[L MLO]
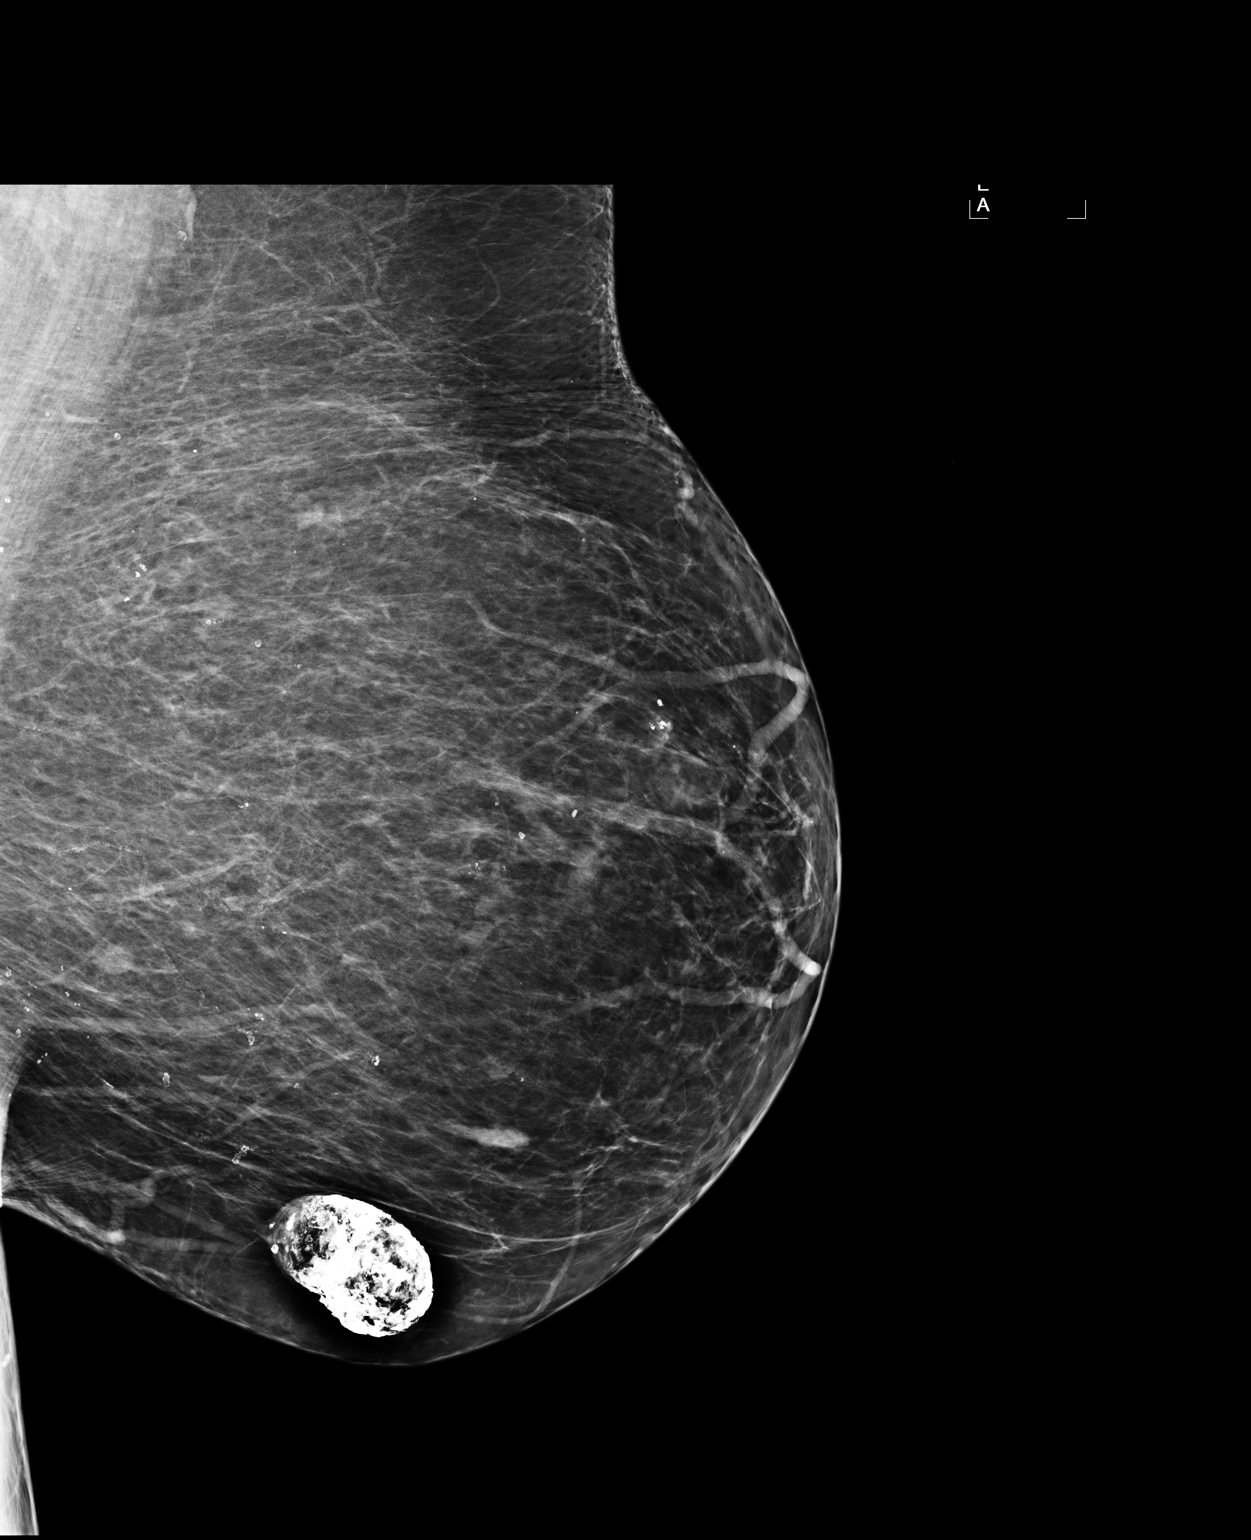

[2 of 2 positions shown; findings below may reference images not displayed]

The breast tissue is almost entirely fatty.  There is no dominant mass, architectural distortion or
calcification to suggest malignancy.
IMPRESSION: No mammographic evidence of malignancy.  Suggest yearly screening mammography.

ASSESSMENT: Negative - BI-RADS 1

Screening mammogram in 1 year.
ANALYZED BY COMPUTER AIDED DETECTION. , THIS PROCEDURE WAS A DIGITAL MAMMOGRAM.

## 2009-09-05 ENCOUNTER — Emergency Department (HOSPITAL_COMMUNITY): Admission: EM | Admit: 2009-09-05 | Discharge: 2009-09-05 | Payer: Self-pay | Admitting: Emergency Medicine

## 2009-12-26 ENCOUNTER — Encounter: Admission: RE | Admit: 2009-12-26 | Discharge: 2009-12-26 | Payer: Self-pay | Admitting: Obstetrics and Gynecology

## 2010-04-13 ENCOUNTER — Encounter: Admission: RE | Admit: 2010-04-13 | Discharge: 2010-04-13 | Payer: Self-pay | Admitting: Endocrinology

## 2010-12-23 ENCOUNTER — Emergency Department (HOSPITAL_COMMUNITY)
Admission: EM | Admit: 2010-12-23 | Discharge: 2010-12-23 | Payer: Self-pay | Source: Home / Self Care | Admitting: Emergency Medicine

## 2011-01-20 ENCOUNTER — Encounter: Payer: Self-pay | Admitting: Obstetrics and Gynecology

## 2011-01-21 IMAGING — CR DG LUMBAR SPINE COMPLETE 4+V
6 series · 6 of 6 positions shown · non-contrast
Comparison: 01/02/2006 study.

CLINICAL DATA: History given of injury from fall with back pain

LUMBAR SPINE - COMPLETE 4+ VIEW

[t l-spine a.p.]
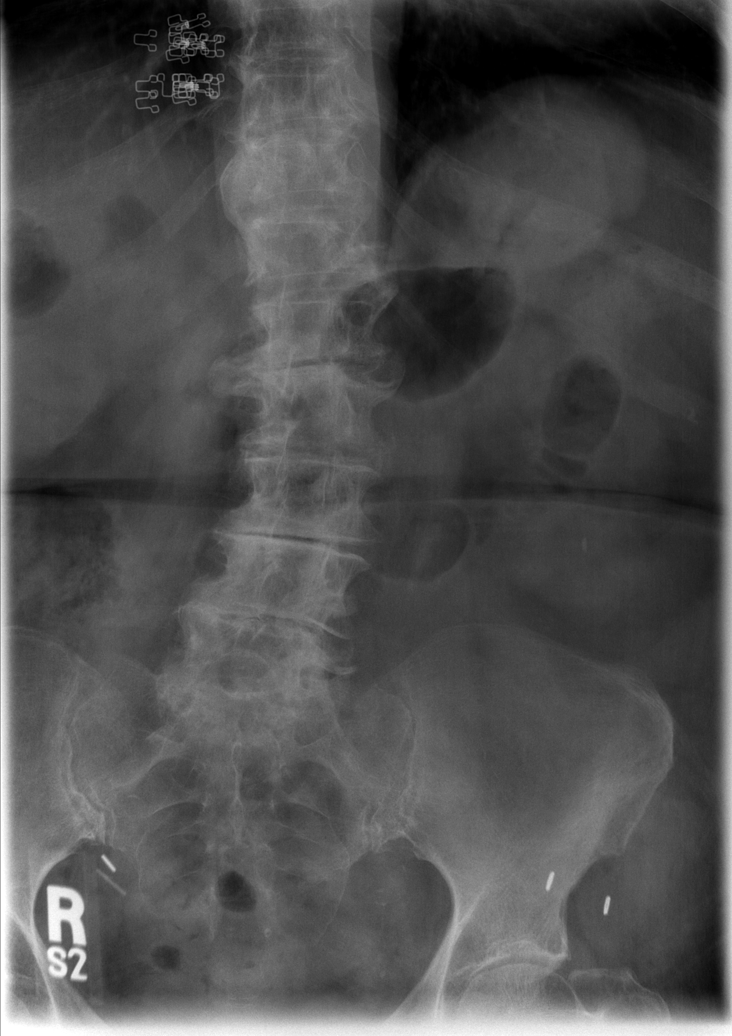

[t l-spine oblique exposure * (1 of 3)]
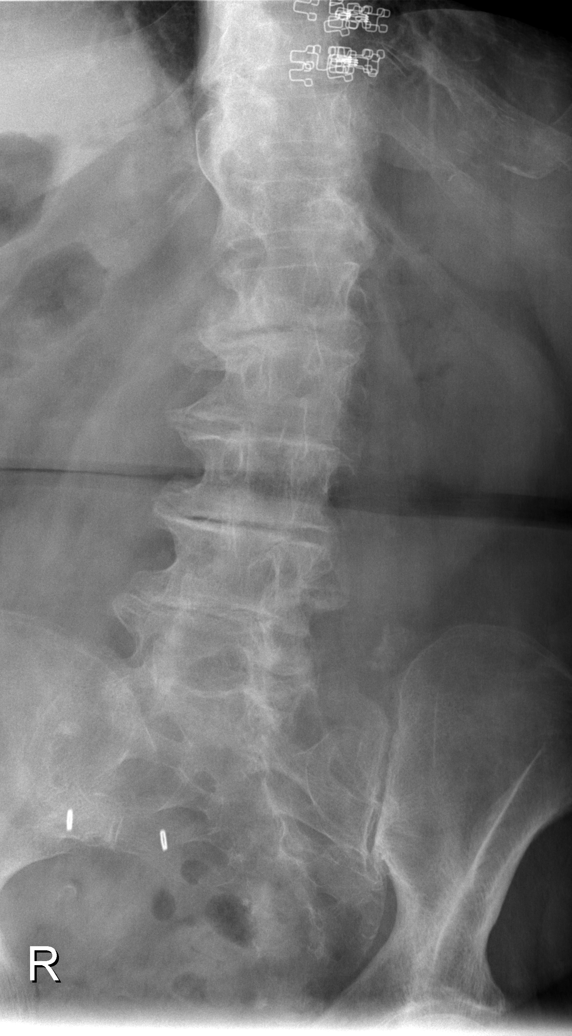

[t l-spine oblique exposure * (2 of 3)]
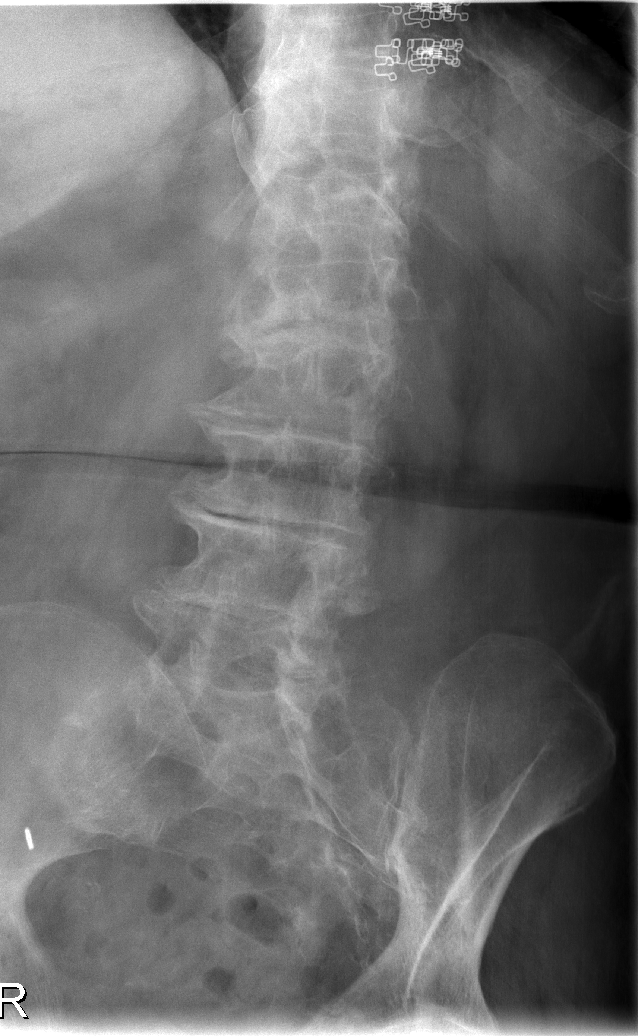

[t l-spine oblique exposure * (3 of 3)]
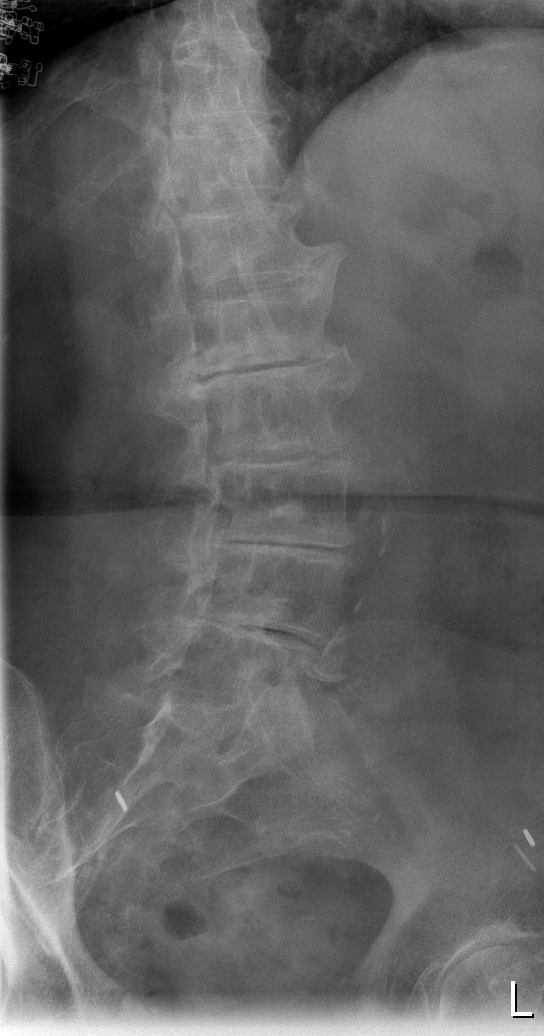

[t l-spine lat *]
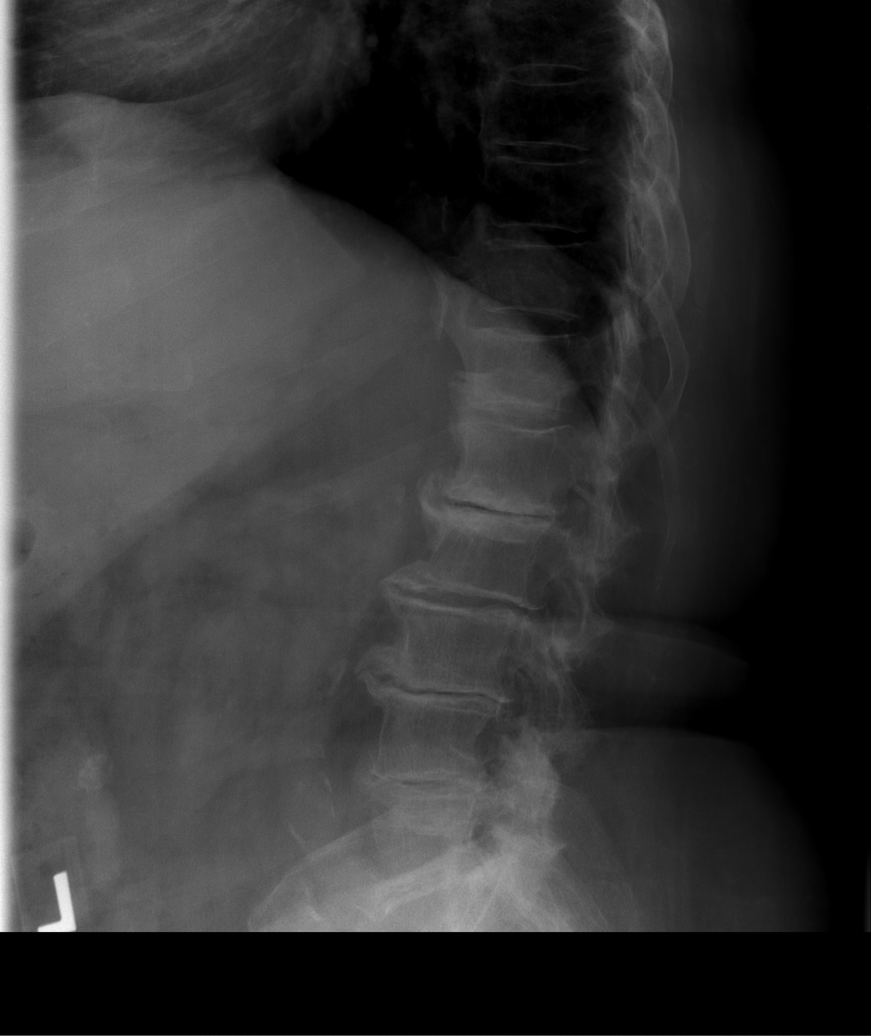

[t l-spine l5-s1 spot]
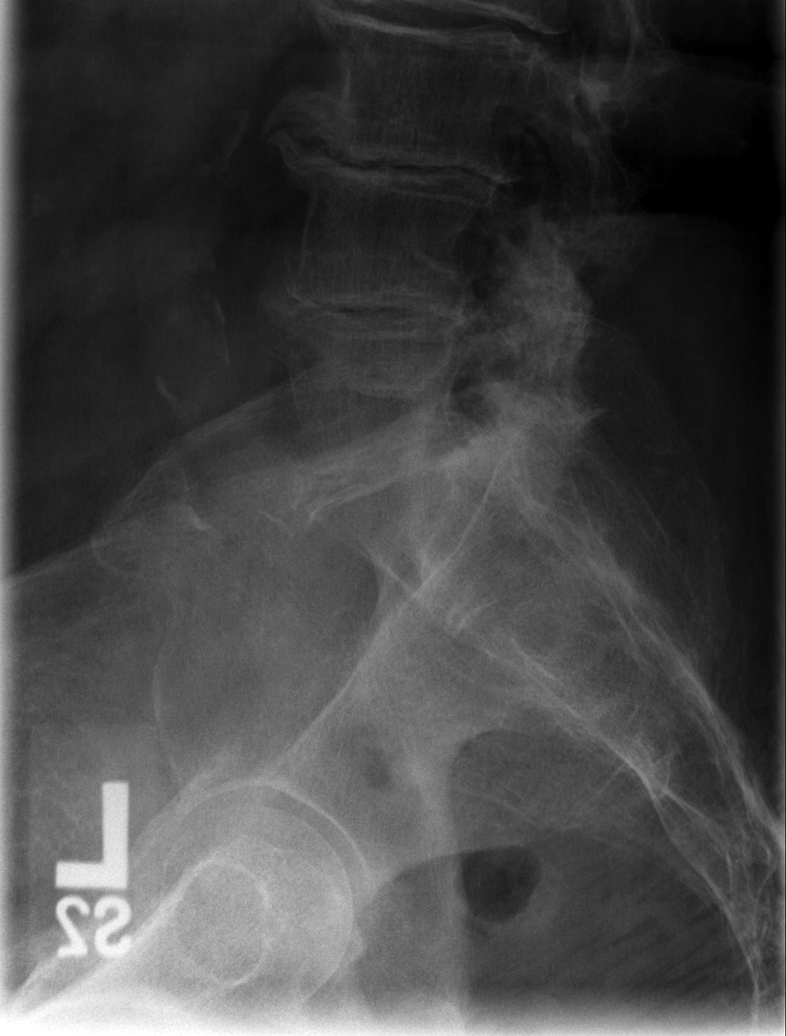

[6 of 6 positions shown; findings below may reference images not displayed]

FINDINGS: On the AP image there is stable moderate scoliosis
convexity to the left. There is a mildly osteopenic appearance of
the bones. There is marginal osteophyte formation at multiple
levels.  No pars defects were evident.  Nonaneurysmal aortic and
iliac calcifications are seen.  There is narrowing of multiple
intervertebral disc spaces.  There is vacuum phenomenon at the L1-
L2 level.  There is chronic slight posterior subluxation of the
body of L2 on the body of L3 and the body of L3 on the body of L4.
No fracture is evident.
IMPRESSION: There is a mildly osteopenic appearance of the bones.  There is
evidence of degenerative disc disease and degenerative spondylosis
at multiple levels.There is chronic slight posterior subluxation of
the body of L2 on the body of L3 and the body of L3 on the body of
L4.  No fracture is evident.

## 2011-01-21 IMAGING — CR DG KNEE COMPLETE 4+V*R*
4 series · 4 of 4 positions shown · non-contrast
Comparison: None

CLINICAL DATA: History given of injury from fall with pain

RIGHT KNEE - COMPLETE 4+ VIEW

[t knee ap right]
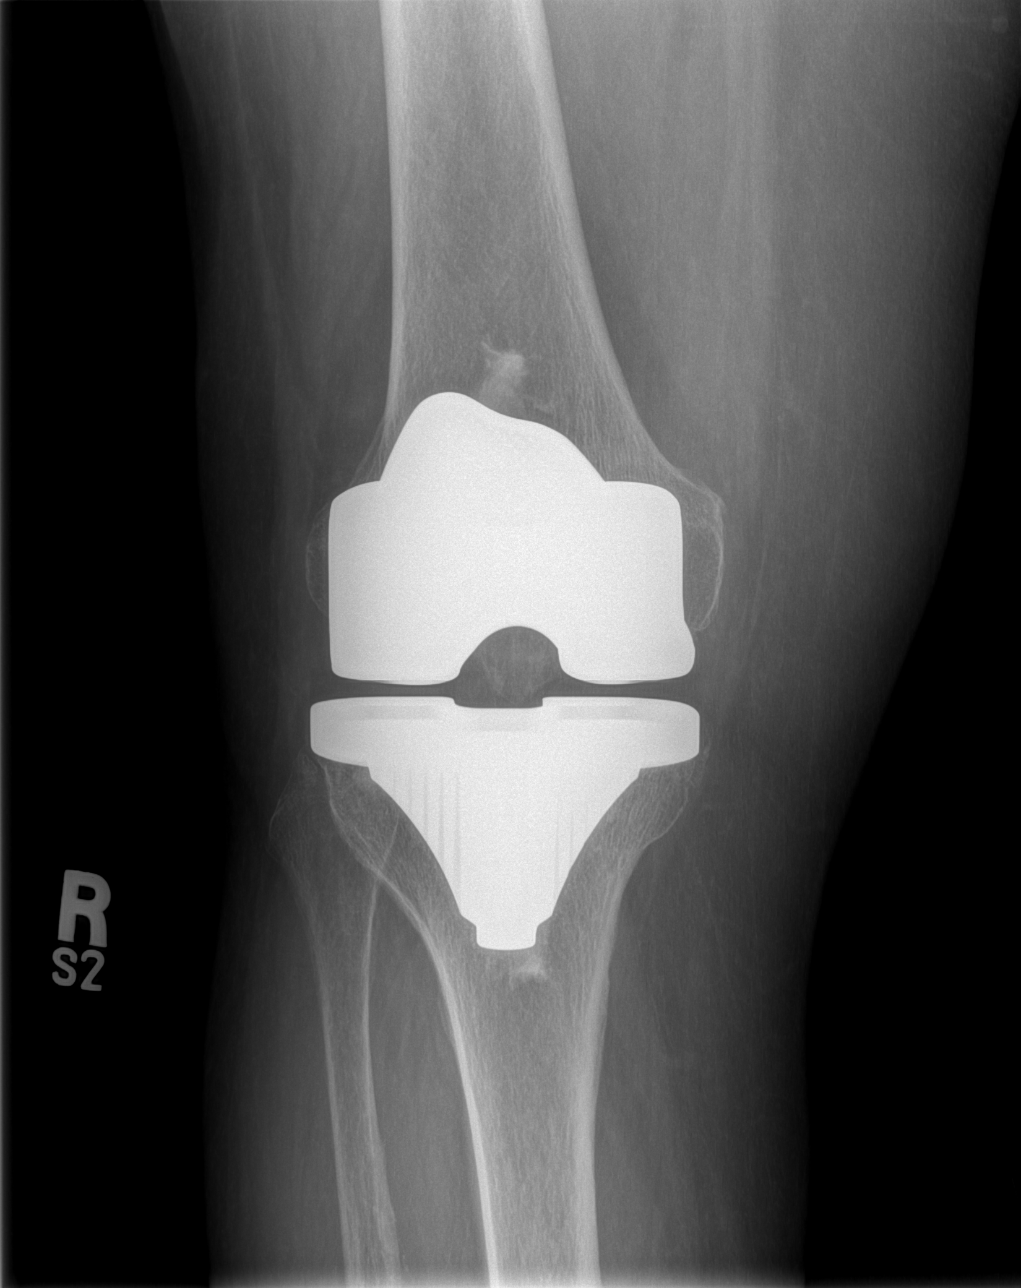

[t knee oblique right (1 of 2)]
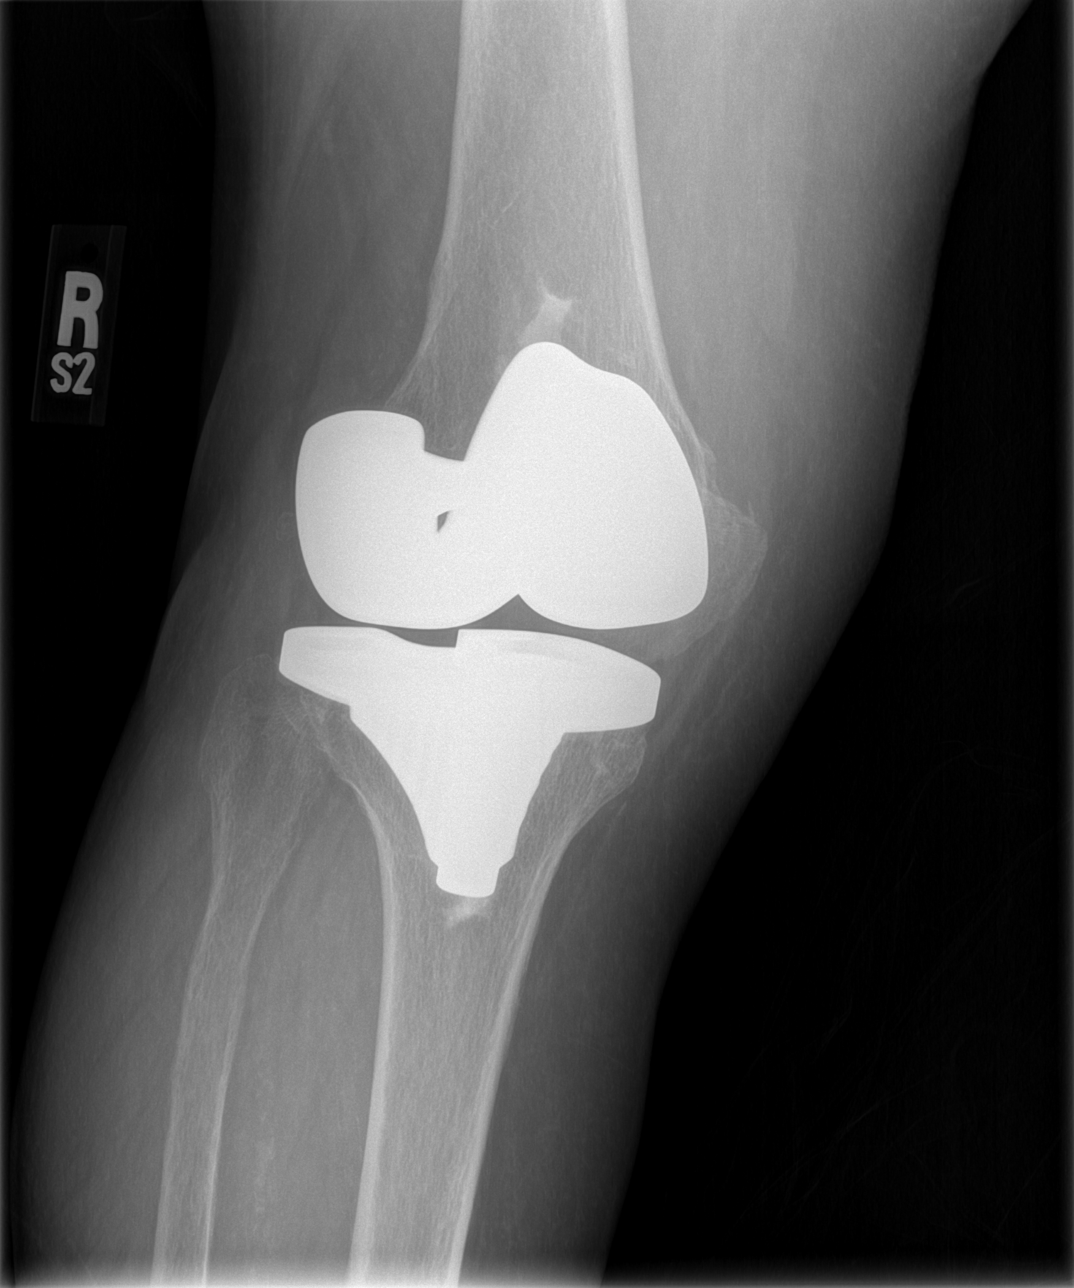

[t knee oblique right (2 of 2)]
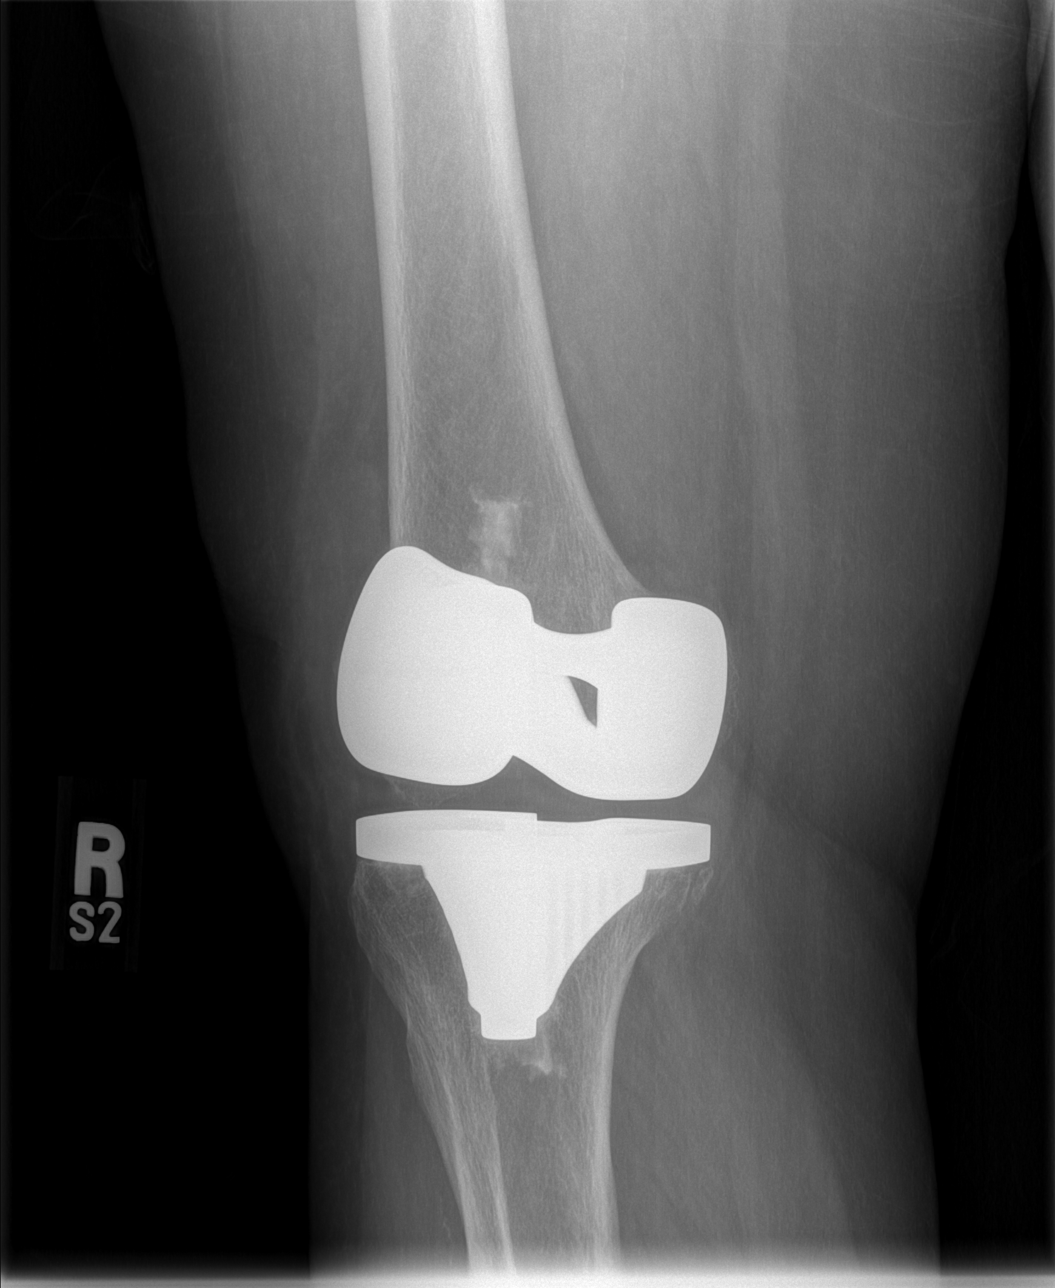

[view not recorded]
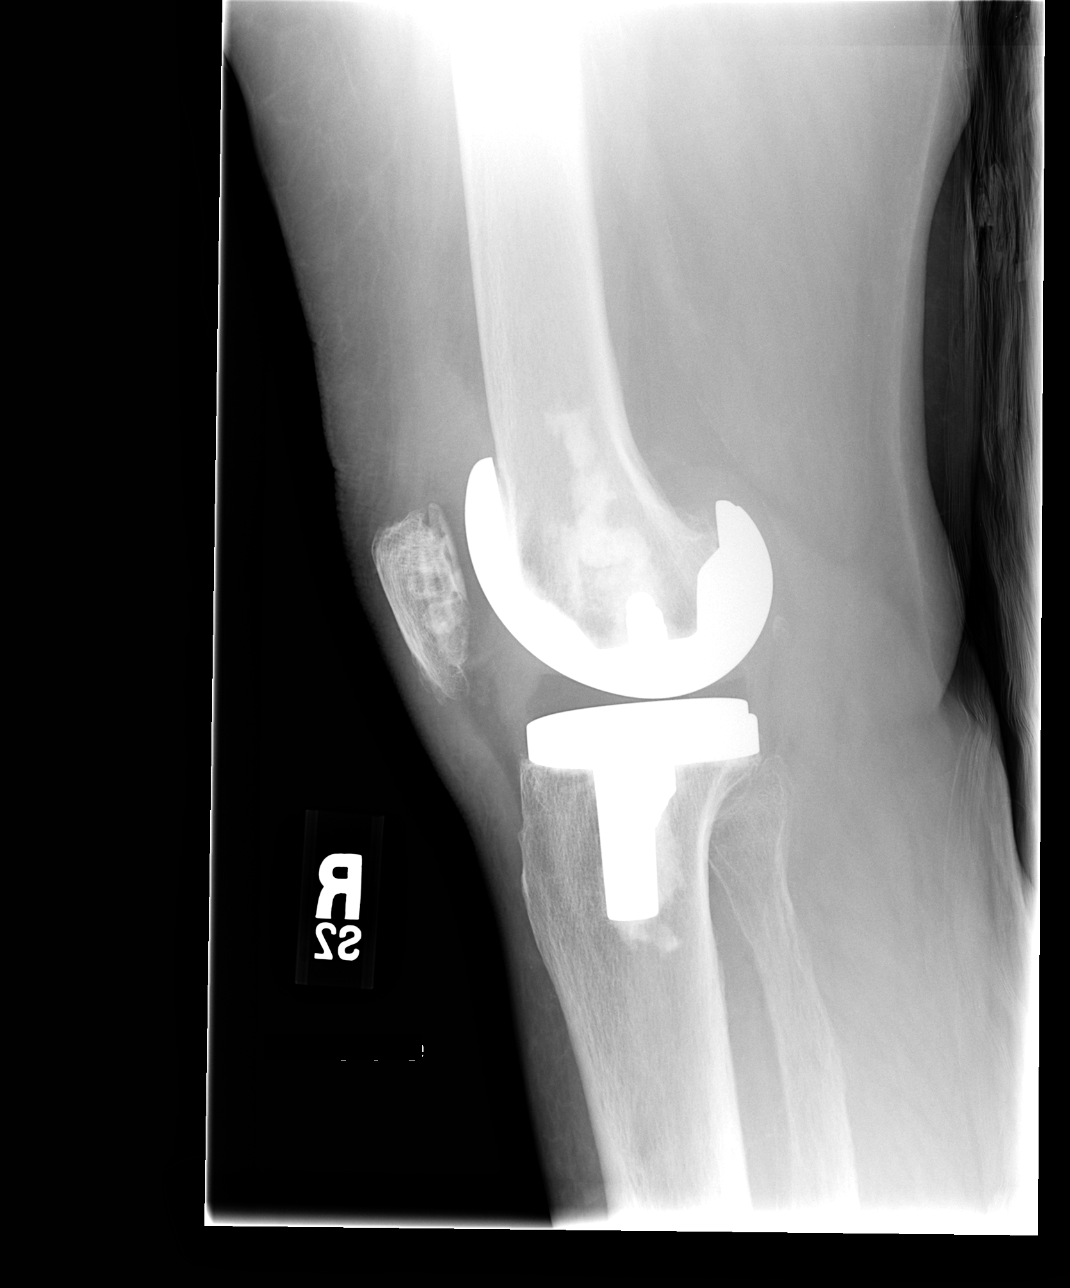

[4 of 4 positions shown; findings below may reference images not displayed]

FINDINGS: Total knee arthroplasty procedure has been performed.  No
disruption of the hardware is seen.  No abnormal lucency is seen
around the hardware.  No fracture or dislocation is evident.  There
is fullness in the anterior superior suprapatellar region
consistent with joint effusion. There is a mildly osteopenic
appearance of the bones.
IMPRESSION: There is evidence of joint effusion.  There is evidence of total
knee arthroplasty procedure having been performed.  No fracture or
dislocation is evident. There is a mildly osteopenic appearance of
the bones.

## 2011-01-21 IMAGING — CT CT MAXILLOFACIAL W/O CM
1 of 2 series · 15 of 30 positions shown, 19 images · non-contrast
Comparison: Head CT 01/02/2006.
COMPARISON: None.

CLINICAL DATA: 76-year-old female status post fall with right
orbital injury.

CT HEAD WITHOUT CONTRAST
TECHNIQUE: Contiguous axial images were obtained from the base of
the skull through the vertex without intravenous contrast.
TECHNIQUE: Multidetector CT imaging of the maxillofacial structures
was performed.  Multiplanar CT image reconstructions were also
generated.

[Series 5: orbit 2.0 h32s · axial · 0.36mm/px · z∈[-242,-106]mm · 15 of 77 slices shown, 19 images]
[im 5/77  brain]
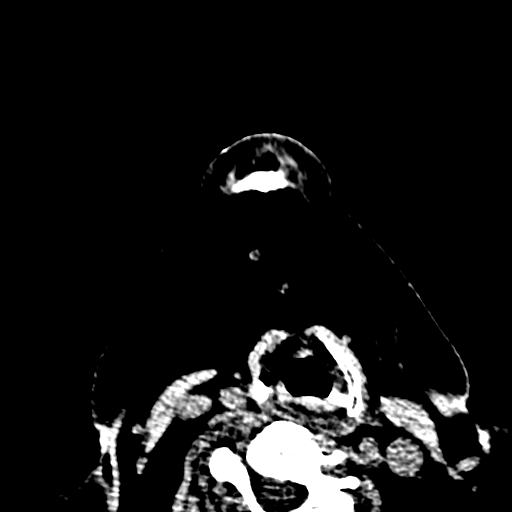
[im 5/77  bone]
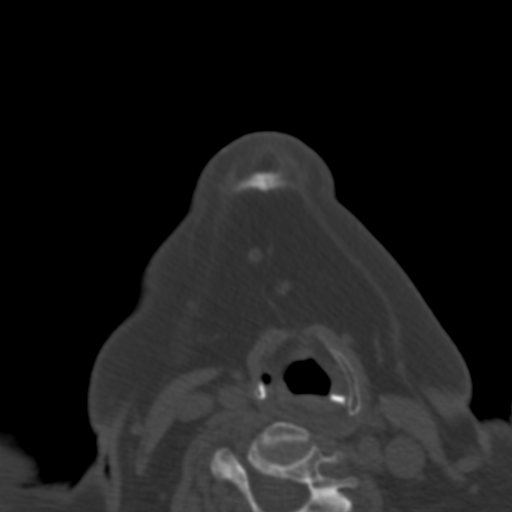
[im 9/77  bone]
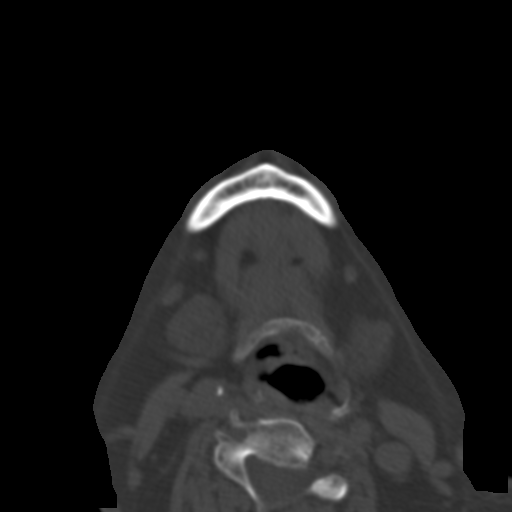
[im 17/77  bone]
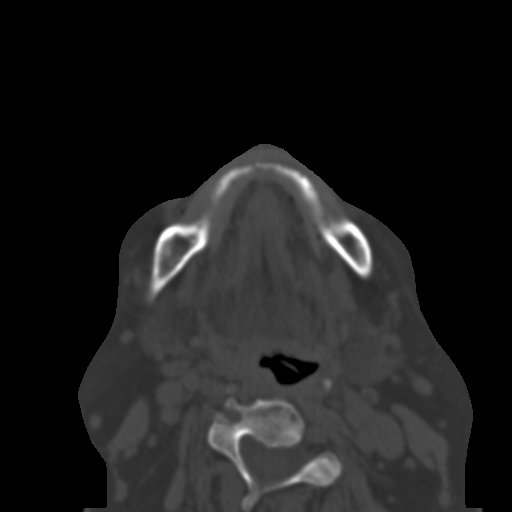
[im 21/77  bone]
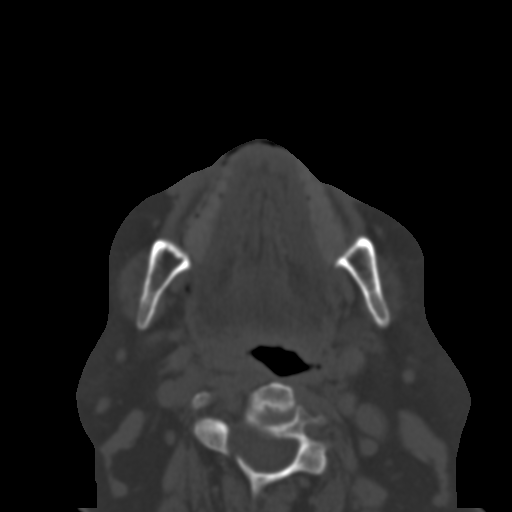
[im 25/77  brain]
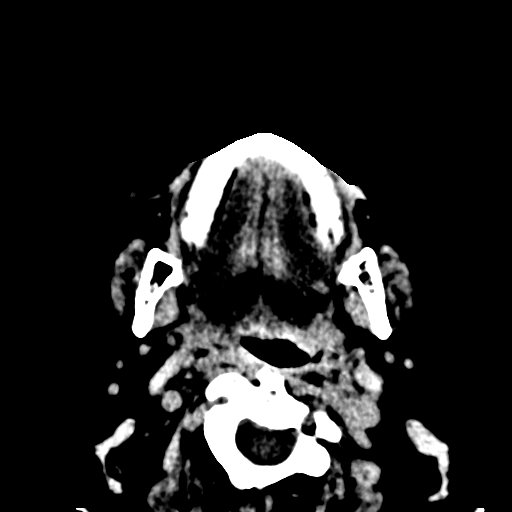
[im 25/77  bone]
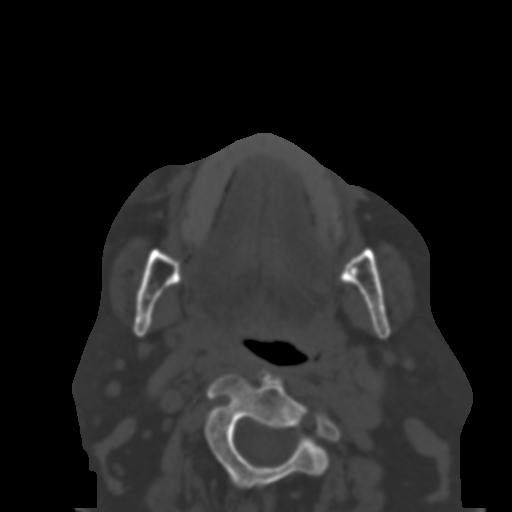
[im 29/77  bone]
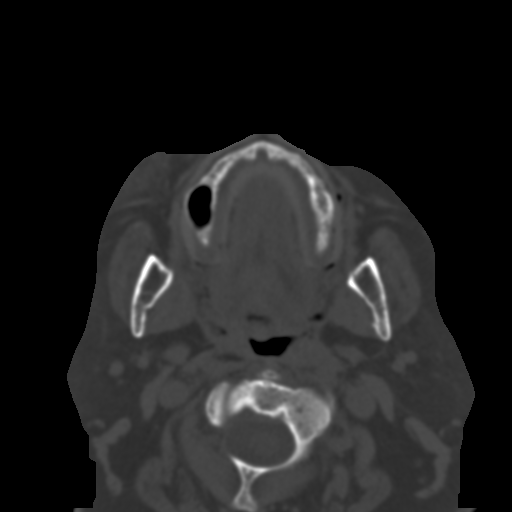
[im 33/77  bone]
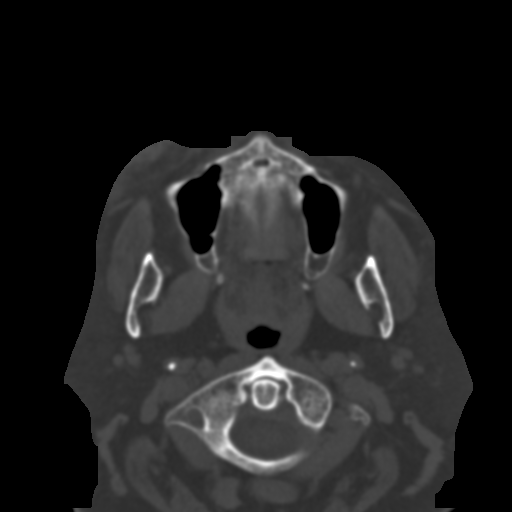
[im 41/77  bone]
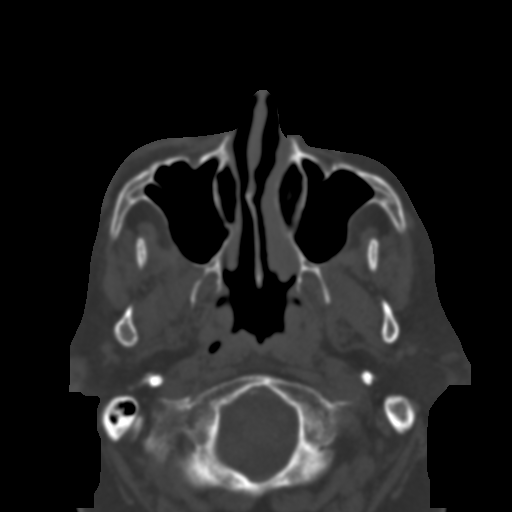
[im 45/77  brain]
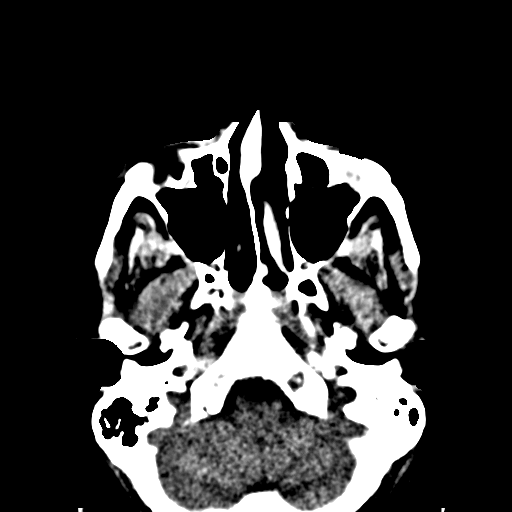
[im 45/77  bone]
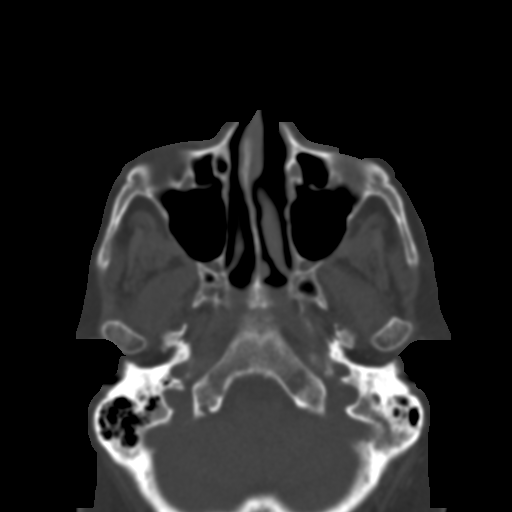
[im 49/77  bone]
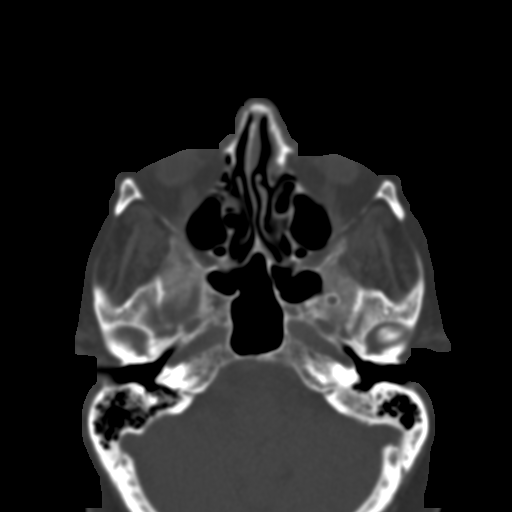
[im 53/77  bone]
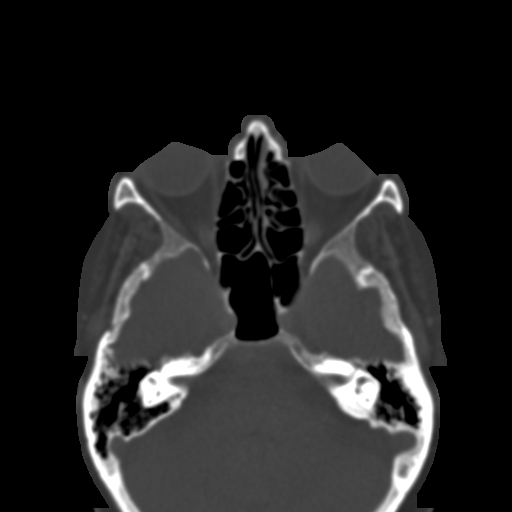
[im 57/77  bone]
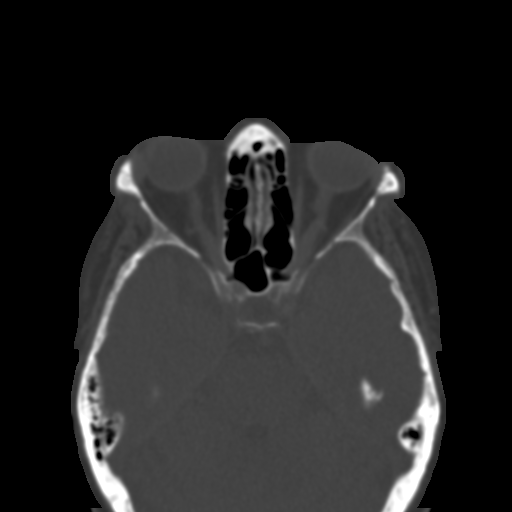
[im 65/77  brain]
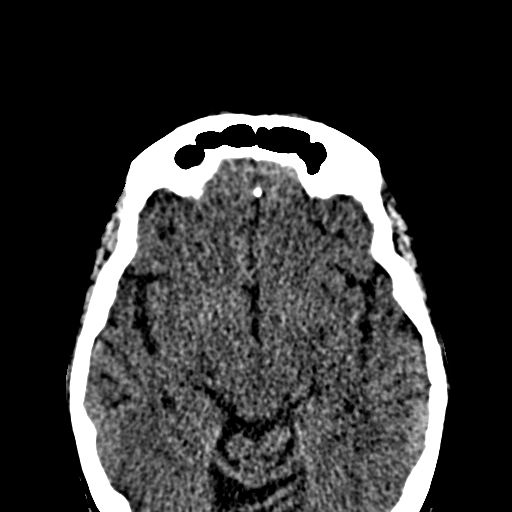
[im 65/77  bone]
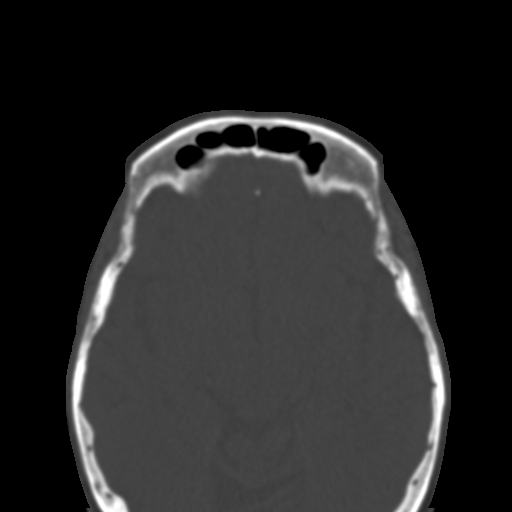
[im 69/77  bone]
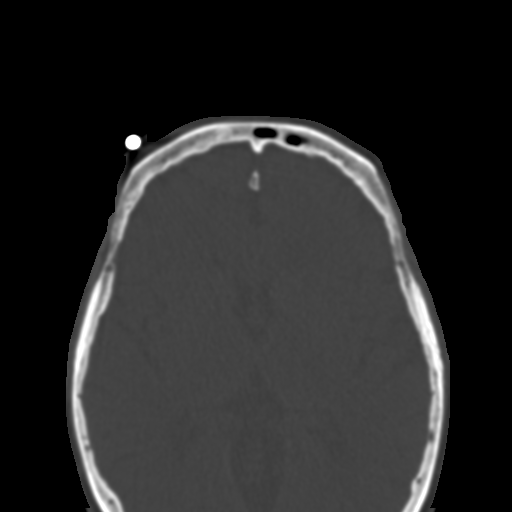
[im 73/77  bone]
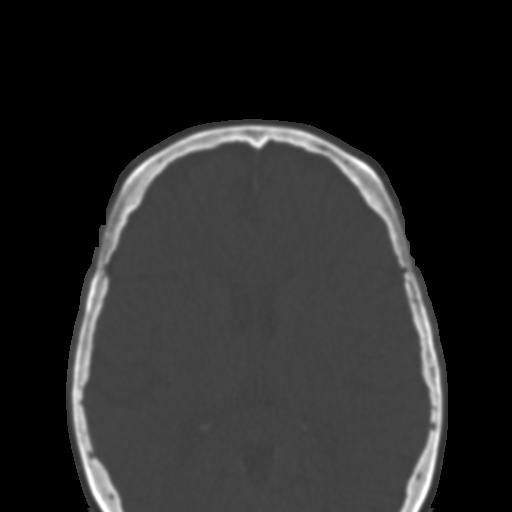

[15 of 30 positions shown; findings below may reference images not displayed]

FINDINGS: Facial findings are below.

Stable scalp soft tissues. Stable visualized osseous structures.
Mastoids are clear. Stable cerebral volume.  Stable ventricle size
and configuration.  No midline shift, mass effect, or evidence of
mass lesion.  No acute intracranial hemorrhage identified.  Stable
gray-white matter differentiation throughout the brain.  Cerebral
white matter hypodensity not significantly changed. No evidence of
cortically based acute infarction identified.
IMPRESSION: No acute intracranial abnormality.

CT MAXILLOFACIAL WITHOUT CONTRAST
FINDINGS: Calcified atherosclerosis at the skull base.
Superficial right periorbital hematoma measures up to 6 mm in
thickness.  There is a focal contusion and small hematoma of the
right premalar soft tissues.  Postoperative changes to the globes.
No retrobulbar are soft tissue injury.  Visualized deep soft tissue
spaces of the face and neck are within normal limits aside from
bilateral parotid gland atrophy. Visualized paranasal sinuses and
mastoids are clear.  Degenerative changes in the upper cervical
spine, especially at the anterior C1-C2 articulation.  Partially
empty sella.  No acute facial fracture identified.
IMPRESSION: Right periorbital and facial soft tissue injury without associated
acute fracture.

## 2011-01-24 ENCOUNTER — Encounter
Admission: RE | Admit: 2011-01-24 | Discharge: 2011-01-24 | Payer: Self-pay | Source: Home / Self Care | Attending: Obstetrics and Gynecology | Admitting: Obstetrics and Gynecology

## 2011-05-13 IMAGING — MG MM SCREENING MAMMOGRAM
2 series · 2 of 2 positions shown · non-contrast
Comparison: Prior studies.

DG SCREEN MAMMOGRAM BILATERAL
Bilateral CC and MLO view(s) were taken.

DIGITAL SCREENING MAMMOGRAM WITH CAD:

[R MLO]
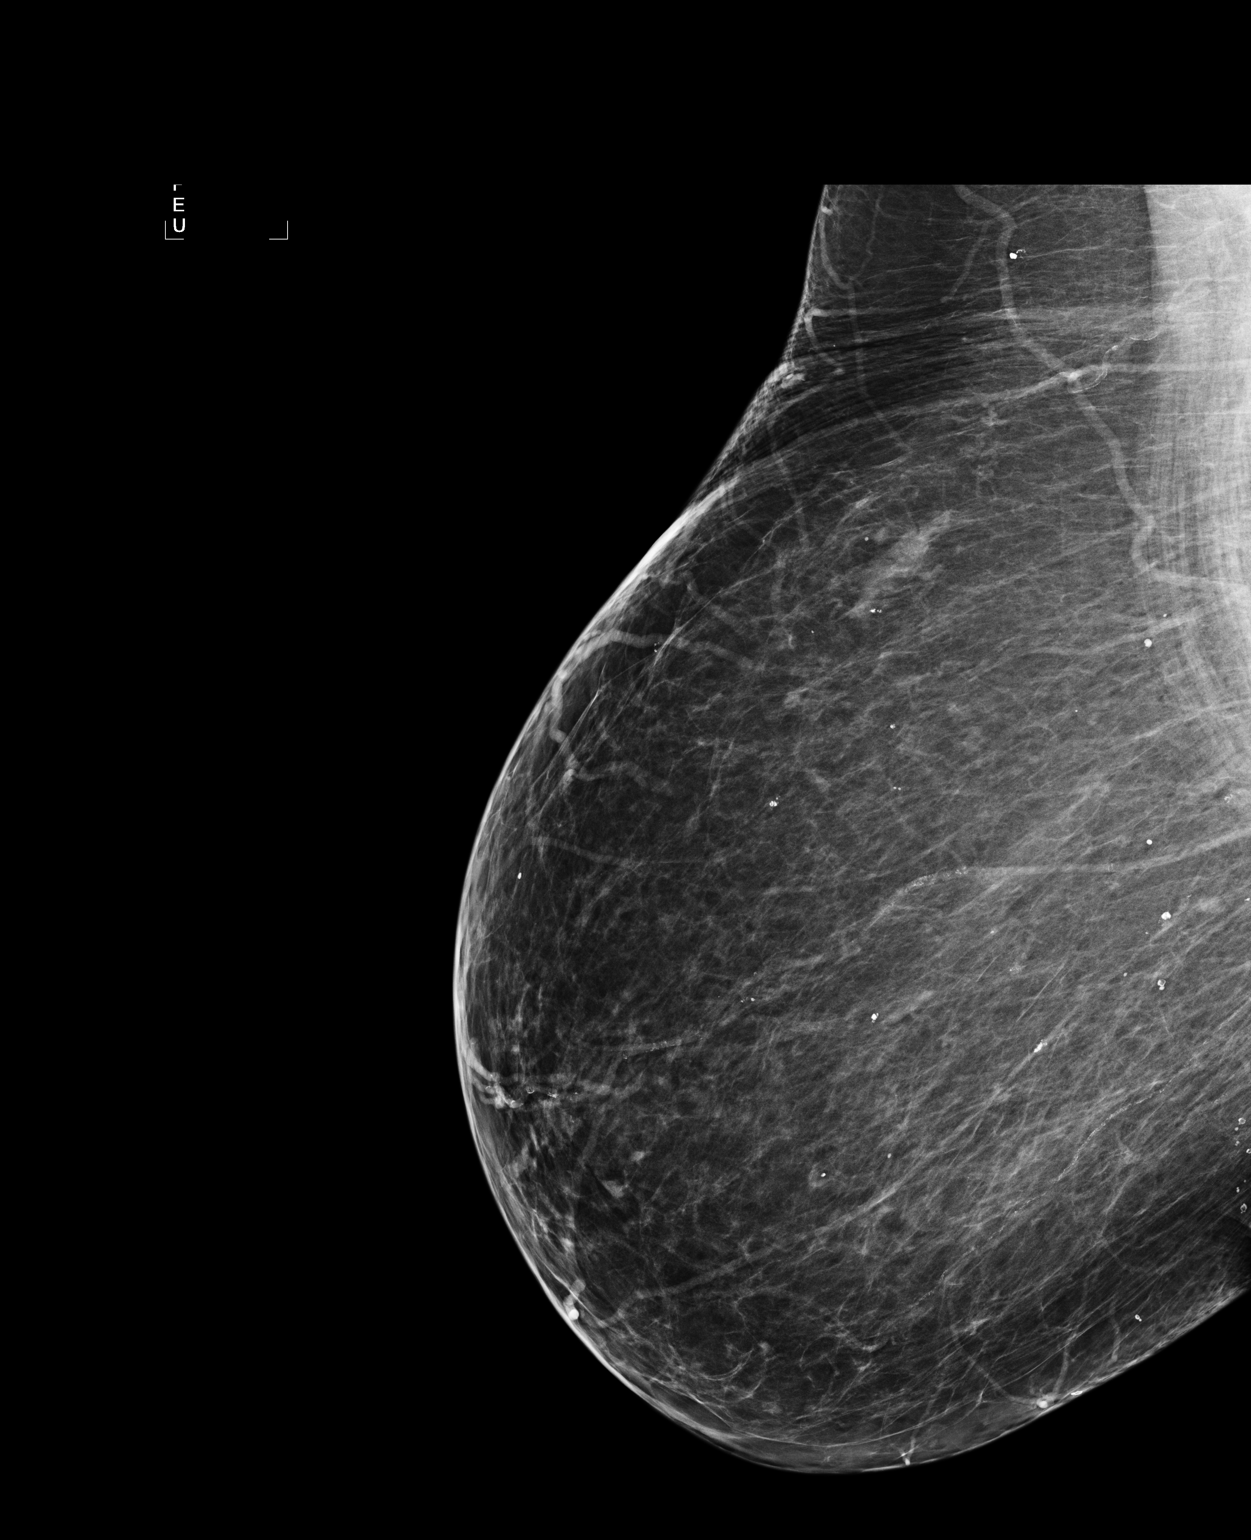

[L MLO]
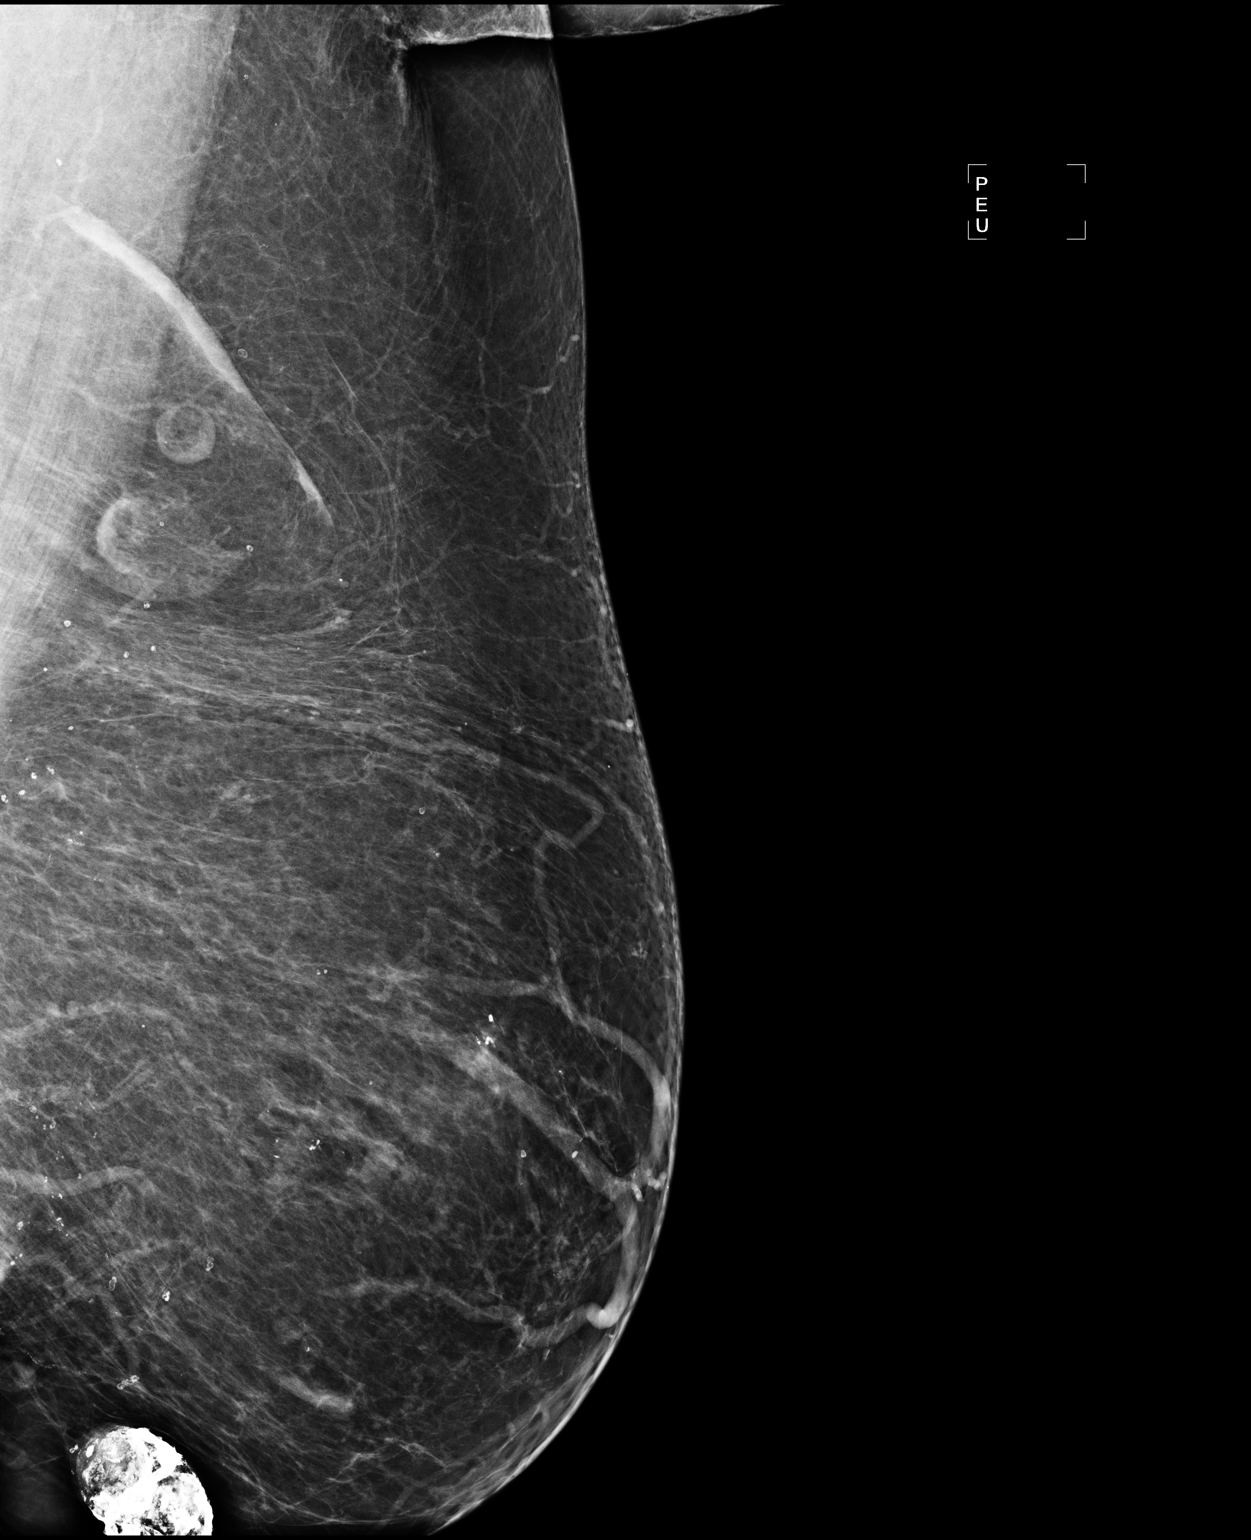

[2 of 2 positions shown; findings below may reference images not displayed]

There are scattered fibroglandular densities.  There is no dominant mass, architectural distortion 
or calcification to suggest malignancy.

Images were processed with CAD.
IMPRESSION: No mammographic evidence of malignancy.  Suggest yearly screening mammography.

A result letter of this screening mammogram will be mailed directly to the patient.

ASSESSMENT: Negative - BI-RADS 1

Screening mammogram in 1 year.
,

## 2011-05-17 NOTE — Op Note (Signed)
   NAME:  [name], [name]                        ACCOUNT NO.:  [id number]   MEDICAL RECORD NO.:  [id number]                   PATIENT TYPE:  AMB   LOCATION:  ENDO                                 FACILITY:  [facility]   PHYSICIAN:  Anselmo Rod, M.D.               DATE OF BIRTH:  [birth year]   DATE OF PROCEDURE:  02/09/2003  DATE OF DISCHARGE:                                 OPERATIVE REPORT   PROCEDURE:  Screening colonoscopy.   ENDOSCOPIST:  Anselmo Rod, M.D.   INSTRUMENT USED:  Olympus video colonoscope.   INDICATIONS FOR PROCEDURE:  A 77 year old white female undergoing screening  colonoscopy to rule out colonic polyps, masses, etc.   PREPROCEDURE PREPARATION:  Informed consent was procured from the patient.  The patient fasted for eight hours prior to the procedure and prepped with a  bottle of magnesium citrate and GoLYTELY the night prior to the procedure.   PREPROCEDURE HISTORY AND PHYSICAL:  The patient had stable vital signs. Neck  supple. Chest clear to auscultation. S1, S2 regular. Abdomen soft with  normal bowel sounds.   DESCRIPTION OF PROCEDURE:  The patient was placed in the left lateral  decubitus position, sedated with 75 mg of Demerol and 7.5 mg of Versed  intravenously. Once the patient was adequately sedated and maintained on low  flow oxygen and continuous cardiac monitoring, the Olympus video colonoscope  was advanced from the rectum to the cecum and terminal ileum without  difficulty. The entire colonic mucosa appeared healthy with a normal  vascular pattern. No masses, polyps, erosions, ulcerations or diverticula  were seen. A small anal papilla was seen on retroflexion. The patient  tolerated the procedure well without complications.   IMPRESSION:  Normal colonoscopy of the terminal ileum except for small anal  papilla on retroflexion.   RECOMMENDATIONS:  1. A high fiber diet have been recommended for the patient along with     liberal fluid  intake.  2.     Repeat colorectal cancer screening is recommended in the next  10 years     unless the patient develops any abnormal symptoms in the interim.  3. Outpatient followup in the next two weeks for further recommendations.                                                Anselmo Rod, M.D.    JNM/MEDQ  D:  02/09/2003  T:  02/09/2003  Job:  [id number]   cc:   Juluis Mire, M.D.  [address]  [place]  [place] [address]  Fax: [phone]

## 2011-05-17 NOTE — Op Note (Signed)
NAME:  [name], [name]                        ACCOUNT NO.:  [id number]   MEDICAL RECORD NO.:  [id number]                   PATIENT TYPE:  INP   LOCATION:  0004                                 FACILITY:  [facility]   PHYSICIAN:  Georges Lynch. Gioffre, M.D.             DATE OF BIRTH:  01/19/1933   DATE OF PROCEDURE:  02/28/2003  DATE OF DISCHARGE:                                 OPERATIVE REPORT   PREOPERATIVE DIAGNOSIS:  Severe degenerative arthritis with a jaded varus of  the left knee.   POSTOPERATIVE DIAGNOSIS:  Severe degenerative arthritis with a jaded varus  of the left knee.   OPERATION:  Left total knee arthroplasty, utilizing Osteonics system.  The  sizes were as follows:  The femoral component was a size 7 left posterior  cruciate sacrificing-type component, the tibial tray was a size 7, the  tibial insert was a 15-mm thick and size 7 posterior stabilized-type insert,  the patella was a size 5 patella.   SURGEON:  Georges Lynch. Darrelyn Hillock, M.D.   ASSISTANT:  Ebbie Ridge. Paitsel, P.A.   DESCRIPTION OF PROCEDURE:  Under general anesthesia, routine orthopedic prep  and drape of the left lower extremity was carried out.  At this time, the  leg was exsanguinated with an Esmarch and the tourniquet was elevated at 350  mmHg.  An incision was made over the anterior aspect of the left leg,  bleeders identified and cauterized.  The incision was carried down to the  quadriceps tendon, a medial parapatellar incision was made, the patella  dissected laterally and the self-retaining retractors were advanced.  At  this time, I flexed the knee, I removed all the spurs from the tibia,  patella, and femur.  I made my initial drill hole in the intercondylar notch  and then made my distal cut at 10-mm thickness off of the distal femur.  A  #2 jig was inserted through a size 7 femur and I carried out my anterior and  posterior chamfering cuts.  Following that, I then prepared the tibia, I  removed 4-mm  thickness off the affected medial side of the tibia.  The  intramedullary guides were used for both femur and tibia.  Following this, I  then went on and thoroughly irrigated out the knee, made my groove cuts,  patellar groove cut and femoral notch cut in the distal femur.  I then went  on and thoroughly water-picked out the knee and then did my patellar cut, I  did resurfacing of the patella.  I thoroughly irrigated the area out, I made  three drill holes in the patella.  I cut the patella down to the appropriate  size.  Following that, I then went on and flexed the knee and cut my keel  cut out of the tibia.  Prior to doing the keel cut, I went through the  trials and felt that a 15-mm thickness tibial  insert would give [place] the best  stability.  After all the cuts were made, I thoroughly water-picked out the  knee, dried the knee out and then cemented all three components in  simultaneously.  I then went on and did a lateral release.  A Hemovac drain  was inserted.  We made sure that all loose pieces of cement were removed.  Note, I did go through trials once again, I tried a 12-mm thickness tibial  insert, that was not stable enough so I went with the 15-mm thickness.  So  we finally used the 15-mm thickness insert.  I thoroughly water-picked out  the knee again, dried the knee out and then plugged the knee in the usual  fashion over a Hemovac drain.  So we used the size 5 patella component, it  was a resurfacing-type component and I used a size 7 tibial tray with a 15-  mm thickness insert, and a size 7 left posterior cruciate stabilizing-type  femoral component.  Note, we obviously excised the anterior and posterior  cruciate ligaments as well as did a lateral medial meniscectomy prior to  making our cuts.  The wound then was closed in layers in the usual fashion,  sterile dressings were applied.  The patient was given 1 g of IV Ancef  preoperatively.                                               Ronald A. Darrelyn Hillock, M.D.   RAG/MEDQ  D:  02/28/2003  T:  02/28/2003  Job:  [id number]

## 2011-05-17 NOTE — Discharge Summary (Signed)
NAME:  [name], [name]                        ACCOUNT NO.:  [id number]   MEDICAL RECORD NO.:  [id number]                   PATIENT TYPE:  INP   LOCATION:  0455                                 FACILITY:  [facility]   PHYSICIAN:  Georges Lynch. Darrelyn Hillock, M.D.             DATE OF BIRTH:  1933-05-10   DATE OF ADMISSION:  02/28/2003  DATE OF DISCHARGE:  03/07/2003                                 DISCHARGE SUMMARY   ADMISSION DIAGNOSES:  1. Left knee pain.  2. Osteoarthritis.  3. Gastric ulcers.  4. History of pneumonia.   DISCHARGE DIAGNOSES:  1. Status post left total knee arthroplasty.  2. Osteoarthritis.  3. Gastric ulcers.  4. History of pneumonia.   PROCEDURES:  The patient was taken to the operating room on 02/28/03, to  undergo a left total knee arthroplasty.   SURGEON:  Georges Lynch. Darrelyn Hillock, M.D.   ASSISTANT:  Ebbie Ridge. Paitsel, P.A.-C.   ANESTHESIA:  General.   DRAINS:  A Hemovac drain was placed at the time of surgery.   CONSULTATIONS:  Physical therapy, occupational therapy.   HISTORY OF PRESENT ILLNESS:  This patient is a 77 year old female who has  had left knee pain for the past two to three years.  She has had increasing  pain in her left knee, it is very difficult for her to ambulate.  She was  given very minimal relief with her non-steroidal anti-inflammatory  medication.  The patient underwent a right total knee arthroplasty in 10/01.  She has done very well.  The patient has elected to proceed with a left  total knee arthroplasty.  She was admitted to the hospital for same.   LABORATORY DATA:  Preadmission CBC:  White blood cell count 7.5, hemoglobin  15.9, hematocrit 45.6, platelet count 245.  Preadmission chemistry profile  was completely normal.  Preadmission urinalysis revealed cloudy urine  specimen with large leukocyte esterase.  The patient also had many  epithelial cells and a few bacteria.  The patient's blood type is B  positive, negative antibody screen.   Preadmission EKG revealed a normal  sinus rhythm with a rate of 89.  Preadmission chest x-ray revealed no active  disease in the chest.  Preadmission x-ray of the left knee revealed complete  collapse of the medial joint space.  Postoperative x-ray of the left knee  revealed left total knee prosthesis in good alignment.   HOSPITAL COURSE:  The patient was admitted to [facility] and taken  to the operating room.  She underwent the above stated procedure without  complication.  The patient tolerated the procedure well, and was allowed to  return to the recovery room then to the orthopaedic floor to continue her  postoperative care.  The Hemovac drain that was placed at the time of  surgery, it was pulled on postoperative day #1.  She was placed on PCA  analgesia for pain control following surgery.  She was weaned over to oral  analgesics, and her PCA was discontinued on postoperative day #4.  Serial  hemoglobins and hematocrits were followed throughout the patient's  hospitalization.  She had a drop in her hemoglobin to 9.8 before  stabilizing.  She did not require blood transfusions.  The patient also  became hypokalemic following surgery, and her potassium was replaced with  oral medication.  The patient was very slow to ambulate, and it was felt  that she could benefit from inpatient rehabilitation.  The patient was  unable to be transferred to the rehabilitation unit because her insurance  would not cover it.  Physical therapy was consulted, and she was able to  ambulate by postoperative day #5.  On postoperative day #7, the patient was  doing much better, was able to ambulate with the aid of a walker and was  discharged home.   DISPOSITION:  The patient was discharged home on 03/07/03.   DISCHARGE MEDICATIONS:  1. Percocet 10/650 one q.6h. p.r.n. pain.  2. Robaxin 500 mg one q.6h. p.r.n. muscle spasm.  3. Trinsicon one tablet daily for anemia.  4. Coumadin 1 mg tablet daily  at 6 p.m.  5. Prevacid 30 mg once daily.   DIET:  As tolerated.   ACTIVITY:  [facility] for home care.  Full weightbearing with walker.   FOLLOWUP:  The patient is to follow up with Dr. Darrelyn Hillock in the office two  weeks from the date of surgery.   CONDITION ON DISCHARGE:  Improved.        Ebbie Ridge. Paitsel, P.A.                     Ronald A. Darrelyn Hillock, M.D.    ZOX/WRUE  D:  03/20/2003  T:  03/21/2003  Job:  [id number]

## 2011-05-17 NOTE — H&P (Signed)
[facility]  Patient:    [name], [name]                    MRN: [id number] Adm. Date:  09/30/00 Attending:  Georges Lynch. Darrelyn Hillock, M.D. Dictator:   Druscilla Brownie. Shela Nevin, P.A. CC:         Juluis Mire, M.D.   History and Physical  DATE OF BIRTH:  1933/02/24  CHIEF COMPLAINT:  "Pain in my knees, more so on the right than on the left."  HISTORY OF PRESENT ILLNESS:  This 77 year old white female has been seen by [place] for continuing and residual problems concerning her knees.  She has had progressive problems with deterioration of both knees.  She has had injections in both knees of a Synvisc series as well as Depo Medrol in the knees.  She is also on p.o. NSAID.  Unfortunately, she has not responded to conservative treatment and had multiple injections of the knees.  X-rays have shows severe deterioration of both knees.  Actually, the x-ray films show deterioration to be more pronounced on the left, but she has more pain on the right. Therefore, we will go ahead with right total knee replacement arthroplasty. She has donated no blood for this procedure, and we will determine, depending on her progress, whether rehabilitation is indicated or not.  PAST MEDICAL HISTORY:  This lady has really been on good healthy throughout her life time.  She has recently had a cardiac clearance for other surgery by Dr. Fraser Din.  She had a cardiac catheterization, which was "negative."  Dr. Ellene Route is her family doctor.  Dr. Kyra Manges is her gynecologist.  PAST SURGICAL HISTORY:  D&C in 1957.  Back operation in 1975.  Abdominoplasty in 2000.  Tonsillectomy in 1970.  CURRENT MEDICATIONS:  Celebrex.  Butasol 30 mg, 1/2 tablet p.r.n. nervousness.  Prempro.  ALLERGIES:  No medical allergies.  FAMILY HISTORY:  Positive for diabtes and arthritis.  SOCIAL HISTORY:  The patient neither smokes nor drinks.  REVIEW OF SYSTEMS:  CNS: No seizure disorder,  paralysis, numbness or double vision.  RESPIRATORY: No productive cough.  No hemoptysis.  No shortness of breath.  CARDIOVASCULAR: No chest pain.  No angina.  No orthopnea. GASTROINTESTINAL: No nausea, vomiting, melena or blood stools.  GENITOURINARY: No discharge, dysuria or hematuria.  MUSCULOSKELETAL: Primarily in the present illness with her knees.  PHYSICAL EXAMINATION:  GENERAL:  Alert, cooperative and friendly 77 year old white female who is accompanied by her husband.  VITAL SIGNS:  Blood pressure 160/88, pulse 86, respirations 12.  HEENT:  Normocephalic.  PERRLA.  EOM intact.  Oropharynx clear.  CHEST:  Clear to auscultation.  No rhonchi.  No rales.  HEART:  Regular rate and rhythm.  No murmurs are heard.  ABDOMEN:  Largely obese.  Nontender.  Liver and spleen not felt.  GENITALIA, RECTAL, PELVIC AND BREASTS:  Not done.  Not pertinent to the present illness.  EXTREMITIES:  The patient has pain and crepitus with range of motion of both knees.  She has boggy synovitis seen, as well.  ADMITTING DIAGNOSIS:  Severe osteoarthritis of both knees, worse on the right than the left.  PLAN:  The patient will undergo a right total knee replacement arthroplasty. She has donated no blood for this procedure.  We will decide on her progress whether inpatient rehabilitation will be indicated. DD:  09/22/00 TD:  09/22/00 Job: [id number]

## 2011-05-17 NOTE — Cardiovascular Report (Signed)
[place]  Patient:    [name], [name]                       MRN: [id number] Proc. Date: 08/19/00 Adm. Date:  [id number] Attending:  Meade Maw A CC:         Georges Lynch. Darrelyn Hillock, M.D.                        Cardiac Catheterization  INDICATIONS FOR PROCEDURE:  Decreased uptake in the interior, anterior apical with significant redistribution, and normal EF.  DESCRIPTION OF PROCEDURE:  After obtaining written informed consent, the patient was brought to the cardiac catheterization lab in the post absorptive state.  Preoperative sedation was achieved using IV Versed.  The right groin was prepped and draped in the usual sterile fashion.  The right femoral head was identified using radiographic technique.  Selective coronary angiography was performed using a JL4/JR4 Judkins catheter.  A single, plain ventriculogram was performed using a #6 French pigtail, curved catheter. Multiple views were obtained.  All catheter exchanges were made over a guide wire.  The hemostasis sheath was flushed following each injection.  FINDINGS:  The aortic pressure is 152/70, and LV pressure is 153/17.  No gradient noted on pullback.  Normal ventriculogram.  No mitral regurgitation.  CORONARY ANGIOGRAPHY: 1. Left main coronary artery: The left main coronary artery bifurcated into    the left anterior descending and circumflex vessels.  There was no    significant disease in the left main coronary artery. 2. Left anterior descending: The left anterior descending gave rise to four    small to moderate diagonals in the ______ .  There was no significant    disease in the LAD or its branches. 3. The circumflex vessel: The circumflex vessel gave rise to a large OM-1 and    large OM-2.  There was no significant disease in the circumflex or its    branches. 4. The right coronary artery: The right coronary artery was dominant and gave    rise to three small artery  marginals. 5. PDA: There was no significant disease in the right coronary artery.  IMPRESSION:  Normal coronary angiography.  Normal ventriculogram.  False positive stress Cardiolite most likely the result of the patients body habitus.  RECOMMENDATIONS:  The patient may proceed with new surgery as planned. DD:  08/19/00 TD:  08/19/00 Job: [id number]

## 2011-05-17 NOTE — Discharge Summary (Signed)
[facility]  Patient:    [name], [name]                 MRN: [id number] Adm. Date:  [id number] Disc. Date: [id number] Attending:  Skip Mayer Dictator:   Ralene Bathe, P.A.                           Discharge Summary  ADMISSION DIAGNOSES: 1. End-stage osteoarthritis of her right knee. 2. Recent negative cardiac catheterization. 3. Morbid obesity. 4. Osteoarthritis.  DISCHARGE DIAGNOSES: 1. End-stage osteoarthritis of her right knee. 2. Recent negative cardiac catheterization. 3. Morbid obesity. 4. Osteoarthritis. 5. Status post right total knee arthroplasty.  OPERATIONS PERFORMED:  Right total knee arthroplasty.  SURGEON:  Dr. Ranee Gosselin.  ASSISTANT:  Dr. Marlowe Kays.  ANESTHESIA:  General  CONSULTS:  None.  BRIEF ADMISSION HISTORY:  This is a 77 year old female with end-stage osteoarthritis bilateral knees.  She has history of morbid obesity and has lost significant amount of weight over the last year or two for total knee arthroplasty. She did have a negative cardiac workup including catheterization for anabnormal EKG preoperatively and was cleared for surgery.  At this time her right knee is much more symptomatic than the left knee with bone-on-bone deformities and significant pain at this time.  Right total knee arthroplasty is indicated.  Risks and benefits discussed with the patient.  She is in agreement and wishes to proceed.  HOSPITAL COURSE:  The patient was admitted and underwent the above-mentioned procedure and toleratedthis well.  All appropriate IV antibiotics and analgesics provided.  Postoperatively, the patient was placed on Coumadin for DVT and PE prophylaxis, all appropriate IV analgesics, and PCA pain medicines. She did experience postoperative confusion felt to be secondary to multiple medicationsand these were weaned.  This cleared.  There was no other cause sited for her confusion.  As this  cleared she began to work with therapy.  She was very slow.  Rehab consult was ordered; however, the patient had progressed in her level of therapies and did not require inpatient stay.  She was kept three additional days because she was having difficulty with increasingher range of motion.  It was felt that a home CPM unit was in order at date of discharge.  On date October 06, 2000, she had begun complaining of some calf tenderness in the operative extremity.  She had been therapeutic on her INR; however, a Doppler was ordered just to rule out DVT.  Thesewere negative.  On date October 07, 2000, she is doing well except a little bit lagging with range of motion.  She is afebrile, vital signs are stable, incision is dry, no drainage.  Minimal swelling is noted.  She is neurovascular intact bilateral lower extremities.  At this time she is stable for discharge home in the care of her family and home health PT and R.N.  LABORATORY AND X-RAY DATA:  Pro times followed by pharmacy on Coumadin for DVT and PE prophylaxis.  Admission hemoglobin 15.6; postoperatively stable at 14.6, 12.2, and 10.9.  A blood gas on admission showed pO2 of 49 and O2 saturation of 87, and this was on date October 4.  Urinalysis on admission showed a cloudy appearance, otherwise normal, and a normal urinalysis also on October 4.  Chemistries on admission were normal as well as on October 4.  Radiology shows a knee (postop view) statuspost right total knee arthroplasty.  A chest  film from August 01, 2000, no acute cardiopulmonary findings.  Knee film preoperatively showed markedmedial compartment narrowing consistent with marked osteoarthritis.  EKG showed normal sinus rhythm.  Cardiac catheterization report showed normal coronary angiography.  CONDITION ON DISCHARGE:  Stable, improved.  DISPOSITION:  The patient is being sent home in the care ofher family.  FOLLOWUP:  She is to follow up in our office two  weekspostoperatively; call for time.  Call the office for any further problems.  DISCHARGE INSTRUCTIONS:  Home CPM for increasing range of motion.  PT, OT, R.N. also arranged for home.  Call for two weeks postop appointment.  Dressing change p.r.n.  DISCHARGE MEDICATIONS:  Prescriptions are written for: 1. Percocet #40 one to two q.4-6h. p.r.n. pain. 2. Coumadin per pharmacy. DD:  10/08/19 TD:  10/08/00 Job: [id number]

## 2011-05-17 NOTE — Op Note (Signed)
[place]  Patient:    [name], [name]                 MRN: [id number] Proc. Date: 09/30/00 Adm. Date:  [id number] Attending:  Skip Mayer                           Operative Report  PREOPERATIVE DIAGNOSIS:  Severe degenerative arthritis right knee.  POSTOPERATIVE DIAGNOSIS:  Severe degenerative arthritis right knee.  OPERATION:  Right total knee arthroplasty.  We used the following sizes.  We utilized the posterior cruciate sacrificing type of Osteonics prosthesis.  All three components were cemented.  I utilized a size 7 right femur, size 7 tibial tray with a 12 mm thickness insert.  I utilized the resurfacing type patella, and we used a size 7 patella.  SURGEON:  Georges Lynch. Darrelyn Hillock, M.D.  ASSISTANT:  Illene Labrador. [place], M.D.  DESCRIPTION OF PROCEDURE:  Under spinal anesthesia, we finally converted to general anesthesia because she was slightly uncomfortable before we even made the incision.  So anesthesia decided to go ahead and give her a general anesthetic.  At this time, the sterile prepping and draping of the right lower extremity was carried out.  The leg was exsanguinated with an Esmarch, and the tourniquet was elevated to 360 mmHg.  An incision was made over the anterior aspect of the right leg.  Bleeders identified and cauterized.  Two flaps were created and reflected medially and laterally in the usual fashion.  I then carried out a median parapatellar incision, reflected the patella laterally, and flexed the knee.  I excised the anterior and posterior cruciate ligaments. I also excised the medial and lateral menisci.  We then made our initial drill hole and also did a synovectomy.  She had a severe synovitis.  I also made a drill hole in the intercondylar notch.  A #1 jig was inserted, and we removed 12 mm thickness off the distal femur.  At this time, we then inserted our #2 jig for a size 7 right femur and  carried out our anterior, posterior, and chamfering cuts.  At this time, we then prepared the tibia in the usual fashion.   We removed 4 mm thickness off of the medial side of the tibia.  We then cut our keel cut in the tibial metaphysis.  We utilized a size 7 tibial tray.  Following this, we then made our appropriate cut, that is our intercondylar notch cut.  After the appropriate cuts were made, we then resurfaced our patella since it was too small for the standard size 26 patella.  We resurfaced our patella down to about a 12 mm thickness. Initially when we started out, it was 20 mm thick.  We did not want to take any more off the patella because it was already quite thin at 12 mm thickness.  Following that, we made three drill holes in the patella for a size 7 resurfacing type patella.  We went through our trials.  We had good motion and good stability with our trials in place.  We thoroughly removed all the trials, did do a lateral release prior to removing trials.  I then thoroughly waterpicked out the knee, dried the knee out, and cemented all three components in simultaneously and removed all loose pieces of cement.  We first inserted a 10 mm thickness tibial insert and then went to a 12 because a  12 was more stable.  We then inserted the permanent 12 mm thickness tibial insert.  We thoroughly irrigated out the knee, inserted one Hemovac drain, and closed the knee in layers in the usual fashion.  Sterile Neosporin dressing was applied.  The patient had 1 gram of IV Ancef preoperatively. DD:  09/30/00 TD:  09/30/00 Job: [id number]

## 2011-05-17 NOTE — H&P (Signed)
NAME:  [name], [name]                        ACCOUNT NO.:  [id number]   MEDICAL RECORD NO.:  [id number]                   PATIENT TYPE:  INP   LOCATION:  NA                                   FACILITY:  [facility]   PHYSICIAN:  Georges Lynch. Gioffre, M.D.             DATE OF BIRTH:  1933/12/01   DATE OF ADMISSION:  DATE OF DISCHARGE:                                HISTORY & PHYSICAL   HISTORY:  The patient has had left knee pain for the past two-three years.  She has had increasing pain in her left knee.  It is very difficult to  ambulate.  She has had very minimal relief with her Celebrex.  The patient  had a right total knee arthroplasty in 10/01 and she has done very well.  An  x-ray of her left knee shows complete collapse of the medial joint space and  the patient has decided to proceed with a left total knee replacement.   CURRENT MEDICATIONS:  1. Celebrex 200 mg daily.  2. Ambien 10 mg h.s.   ALLERGIES:  None known.   PRIMARY CARE PHYSICIAN:  Dr. Ellene Route.   PAST MEDICAL HISTORY:  1. Pneumonia in 1984.  2. Gastric ulcers.  3. Osteoarthritis.   FAMILY HISTORY:  Father had heart disease and diabetes.  Mother had CVA and  arthritis.   SOCIAL HISTORY:  The patient is married.  She is retired.  She lives in a  single story dwelling.  Her husband will be her caregiver after surgery.   PAST SURGICAL HISTORY:  1. Back surgery in 1975.  2. Right total knee arthroplasty 10/01.   REVIEW OF SYSTEMS:  GENERAL: Denies weight change, fever, chills, fatigue.  HEENT: Denies headache, visual changes, tinnitus, hearing loss, or sore  throat.  CARDIOVASCULAR: Denies chest pain, palpitations, shortness of  breath.  PULMONARY: Denies dyspnea, wheezing, cough, sputum production, or  hemoptysis.  GASTROINTESTINAL: Denies dysphasia, nausea and vomiting,  hematemesis, or abdominal pain. GENITOURINARY: Denies dysuria, frequency,  nocturia, hematuria.  ENDOCRINE: Denies polyuria, polydipsia,  appetite  change, heat or cold intolerance.  MUSCULOSKELETAL: Decreased range of  motion left knee, painful.  NEUROLOGIC: Denies dizziness, vertigo, syncope,  seizure.  SKIN: Denies itching, rashes, masses, or moles.   PHYSICAL EXAMINATION:  VITAL SIGNS: Temperature 98, pulse 88, respirations  18, blood pressure 130/88 right arm sitting.  GENERAL: A well-developed well-nourished 77 year old black female in no  acute distress.  HEENT: PERRL.  EOMs intact.  Pharynx clear.  TMs clear.  NECK: Supple without masses.  No carotid bruits detected.  CHEST: Clear to auscultation bilaterally.  No wheezing, rales, or rhonchi  noted.  HEART: Regular rate and rhythm without murmurs, rubs, or gallops.  ABDOMEN: Obese, soft.  Positive bowel sounds.  Nontender.  EXTREMITIES: Painful range of motion of left knee.  SKIN: Warm and dry.   LABORATORY DATA:  X-ray of the left knee shows complete  collapse of the  medial joint space.   IMPRESSION:  Osteoarthritis of the left knee.   PLAN:  The patient is to be admitted to [facility] on 02/28/03 to  undergo a left total knee arthroplasty.     Ebbie Ridge. Paitsel, P.A.                     Ronald A. Darrelyn Hillock, M.D.    ZOX/WRUE  D:  02/22/2003  T:  02/22/2003  Job:  [id number]

## 2011-05-17 NOTE — Op Note (Signed)
   NAME:  [name], [name]                        ACCOUNT NO.:  [id number]   MEDICAL RECORD NO.:  [id number]                   PATIENT TYPE:  AMB   LOCATION:  DAY                                  FACILITY:  [facility]   PHYSICIAN:  Georges Lynch. Darrelyn Hillock, M.D.             DATE OF BIRTH:  15-Mar-1933   DATE OF PROCEDURE:  11/03/2003  DATE OF DISCHARGE:                                 OPERATIVE REPORT   SURGEON:  Georges Lynch. Darrelyn Hillock, M.D.   ASSISTANT:  PA student, Georga Kaufmann.   PREOPERATIVE DIAGNOSIS:  Severe carpal tunnel syndrome on the right.   POSTOPERATIVE DIAGNOSIS:  Severe carpal tunnel syndrome on the right.   OPERATION:  Right carpal tunnel release.   DESCRIPTION OF PROCEDURE:  The patient first had 1 g of IV Ancef.  Sterile  prep and draping of the right upper extremity was carried out.  A lazy-S  incision was made over the flexor aspect of the right wrist.  Bleeders were  identified and cauterized with the hand bipolar.  At this particular time,  the incision was carried down to the palmar fascia.  The self-retaining  retractor was inserted.  At this time, a small opening was made in the deep  and superficial transverse carpal ligaments; a freer instrument was inserted  to protect the nerve.  I then incised the deep and superficial palmar  fascia.  The deep and superficial transverse carpal ligatures were incised  into the palm and proximally as well.  The nerve now was free.  There were  no other abnormalities in the carpal tunnel.  I thoroughly irrigated out the  area.  Then I closed the skin only with 3-0 nylon suture, and a sterile  Neosporin dressing was applied.  The patient left the operating room in  satisfactory condition.                                               Ronald A. Darrelyn Hillock, M.D.    RAG/MEDQ  D:  11/03/2003  T:  11/03/2003  Job:  [id number]

## 2011-05-17 NOTE — H&P (Signed)
[facility]  Patient:    [name], [name]                       MRN: [id number] Adm. Date:  08/18/00 Attending:  Georges Lynch. Darrelyn Hillock, M.D. Dictator:   Ralene Bathe, P.A. CC:         Juluis Mire, M.D.   History and Physical  CHIEF COMPLAINT:  Bilateral knee pain, right greater than left.  HISTORY OF PRESENT ILLNESS:  This is a 77 year old female with chronic and progressive knee pain secondary to osteoarthritis. Her right knee has been much more symptomatic than her left. The patient has a history of morbid obesity but has lost over 96 pounds over the past year after undergoing a panniculectomy of about approximately 25 pounds and also by being on diet. She now wishes to proceed with total knee arthroplasty. On physical examination, she has a significant crepitus to her right knee on range of motion. She has joint pain both medial and lateral to palpation and bone-on-bone deformity by x-ray. At this time, right total knee arthroplasty is indicated. She has failed conservative measures, including steroid, as well as ______ supplementation. At this time, she wishes to proceed. Risks, benefits discussed with the patient. She is in agreement.  PAST MEDICAL HISTORY: 1. Morbid obesity. 2. Anxiety.  PAST SURGICAL HISTORY: 1. Lumbar surgery in 1980. 2. Tonsillectomy. 3. D&C procedures, most recently was this month on August 06, 2000. 4. Panniculectomy.  MEDICAL PHYSICIAN:  Juluis Mire, M.D.  OBSTETRICS/GYNECOLOGY:  Katherine Roan, M.D.  DRUG ALLERGIES:  No known drug allergies.  CURRENT MEDICATIONS: 1. Celebrex 200 mg q.d. 2. Prempro 5 mg q.d. 3. Butisol sodium 30 mg 1/2 p.r.n. anxiety.  FAMILY HISTORY:  Significant for diabetes.  SOCIAL HISTORY:  The patient lives with her husband. She does not drink or smoke. She lives in a [place] home with five steps into the usual entrance. Her husband, however, does work 12-hour shifts, and she  will need some assistance; as well as I hope, we will get a rehab consult to see how she progresses postoperatively.  REVIEW OF SYSTEMS:  The patient denies any recent fevers, chills, night sweats. No bleeding tendencies. CNS:  No blurred or double vision, seizures, or paralysis. RESPIRATORY:  No shortness of breath, productive cough, or hemoptysis. CARDIOVASCULAR:  No chest pain, angina, orthopnea. GI:  No nausea, vomiting, constipation, melena, bloody stools. GENITOURINARY:  No dysuria or hematuria. MUSCULOSKELETAL:  As pertinent to present illness.  PHYSICAL EXAMINATION:  GENERAL:  Well-developed, well-nourished, obese, 77 year old female walks with an antalgic gait.  VITAL SIGNS:  Pulse 72, respirations 12, blood pressure 164/88.  HEENT:  Normocephalic. Extraocular movements intact.  NECK:  Supple. No lymphadenopathy. No carotid bruits appreciated on exam.  CHEST:  Clear to auscultation bilaterally. No rales or rhonchi.  HEART:  Regular rate and rhythm. No murmurs, rubs, gallops, heaves, or thrills.  ABDOMEN:  Positive bowel sounds. Obese. Nontender to palpation.  EXTREMITIES:  As in history of present illness. Positive distal pulses. No pitting edema noted.  IMPRESSION: 1. End-stage osteoarthritis right knee. 2. Morbid obesity. 3. History of anxiety.  PLAN:  The patient will be scheduled to undergo right total knee arthroplasty. She did not donate autologous blood, so we will have to discuss with patient as she wants to not have transfusion if at all possible. We will also get a rehab consult postoperatively to see how she progresses. She may need a little  additional inpatient stay secondary to her husband working 12-hour shifts.  ADDENDUM:  The patient did have EKG changes from her previous surgery in 2000. A cardiology consult was obtained preoperatively. At the time of dictation, she has not seen cardiologist. However, I am waiting clearance for  surgical procedure. We will send details to the hospital. DD:  08/12/00 TD:  08/12/00 Job: [id number]

## 2011-05-17 NOTE — Op Note (Signed)
[facility]  Patient:    [name], [name]                 MRN: [id number] Proc. Date: 08/06/00 Adm. Date:  [id number] Attending:  Lendon Colonel                           Operative Report  PREOPERATIVE DIAGNOSIS:  Endometrial polyps, abnormal endometrial stripe, on hormone replacement therapy.  OPERATION:  Hysteroscopy with resection of multiple large endometrial polyps.  DESCRIPTION OF PROCEDURE:  The patient was placed in lithotomy position and prepped and draped in the usual fashion.  Cervix dilated and endometrial cavity visualized.  There were at least three large and multiple small endometrial polyps which were resected with the resectoscope.  The entire endometrial cavity was resected.  All of the tissue was sent to the lab for study.  There was no unusual blood loss or suspicious areas other than the cystic endometrial polyps.  [name] tolerated the procedure well.  At the termination of her case, I catheterized her and got about 100 cc of concentrated urine. DD:  08/06/00 TD:  08/06/00 Job: [id number]

## 2011-08-29 IMAGING — CR DG CHEST 2V
2 series · 2 of 2 positions shown · non-contrast
Comparison: 05/04/2004.

CLINICAL DATA: Wheezing and shortness of breath.

CHEST - 2 VIEW

[w chest pa]
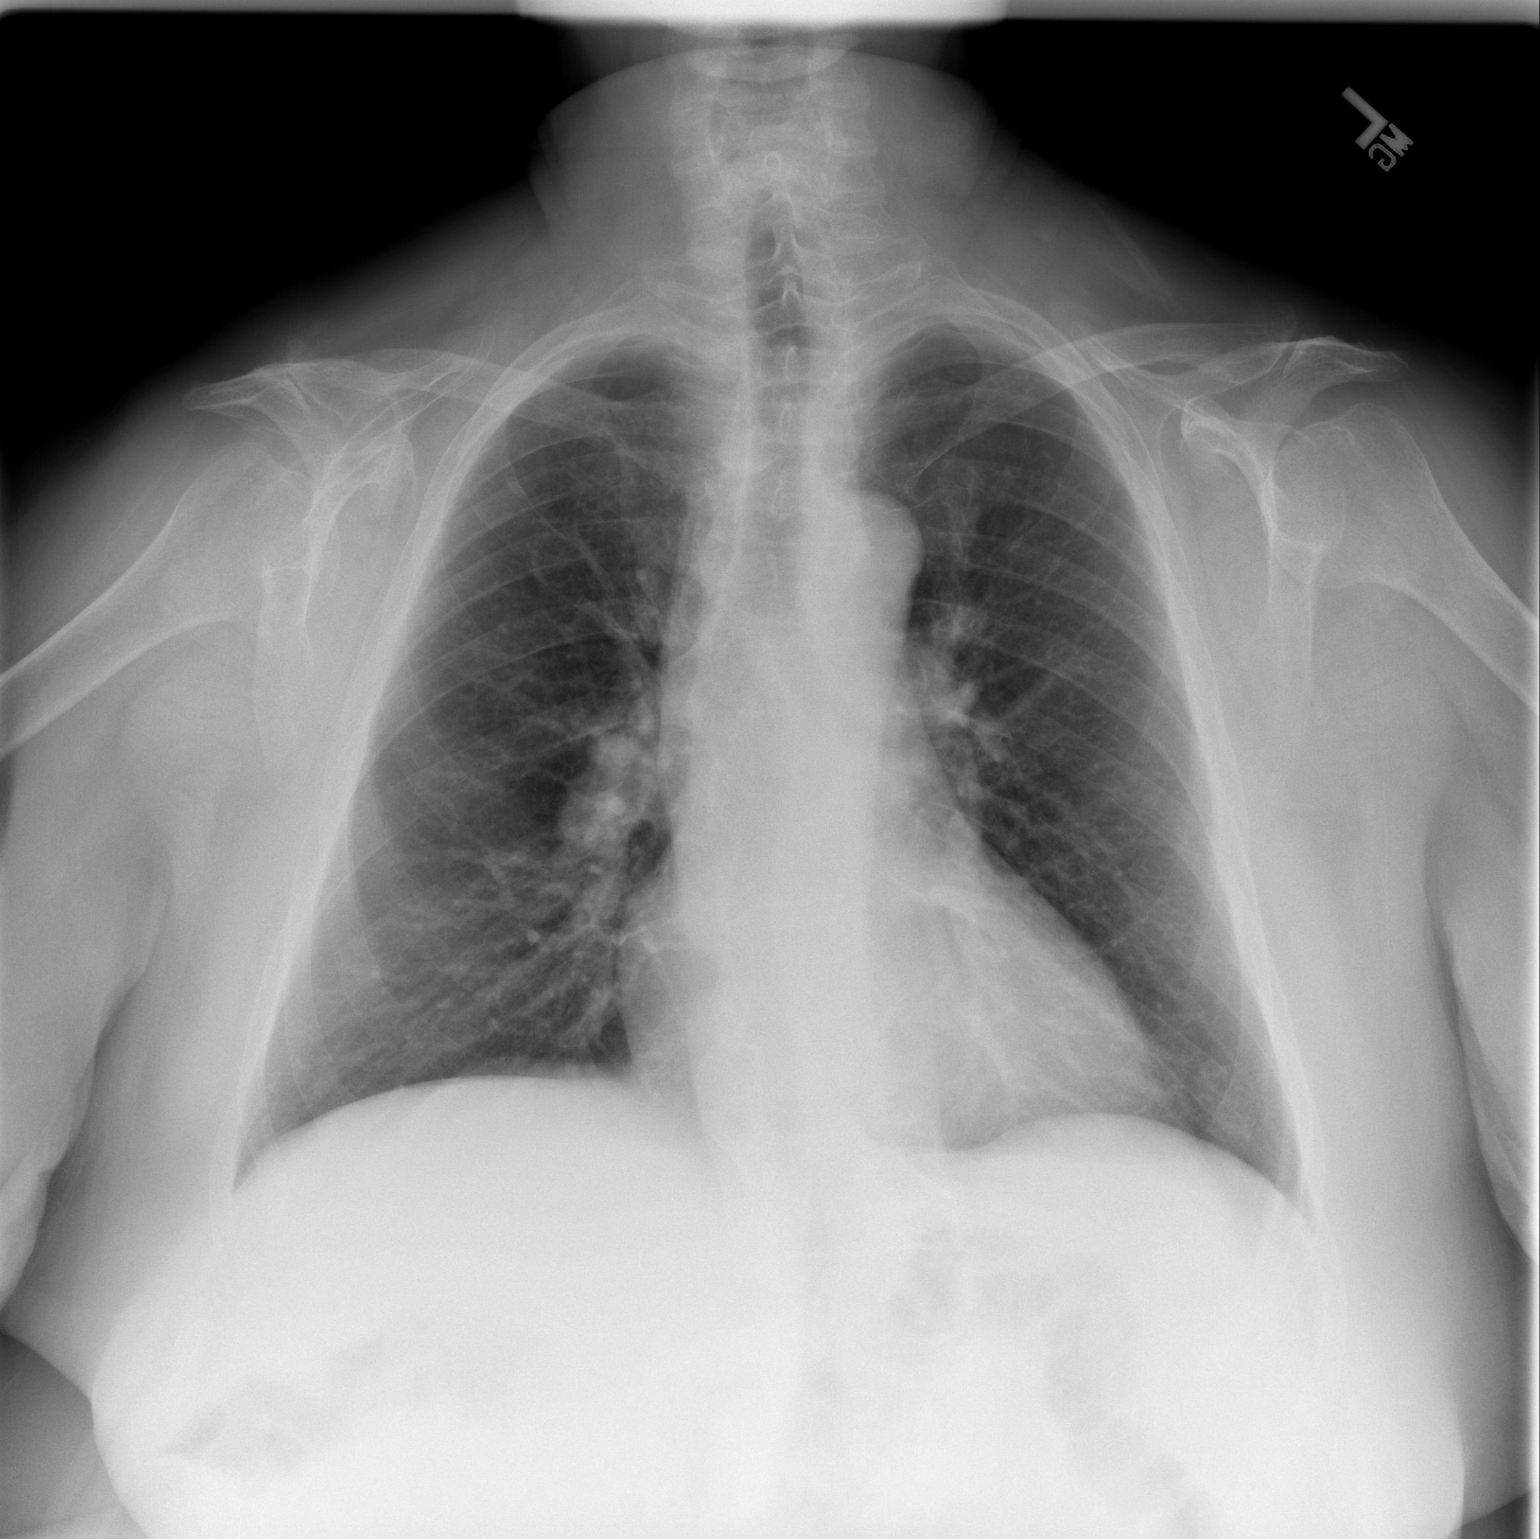

[w chest lat]
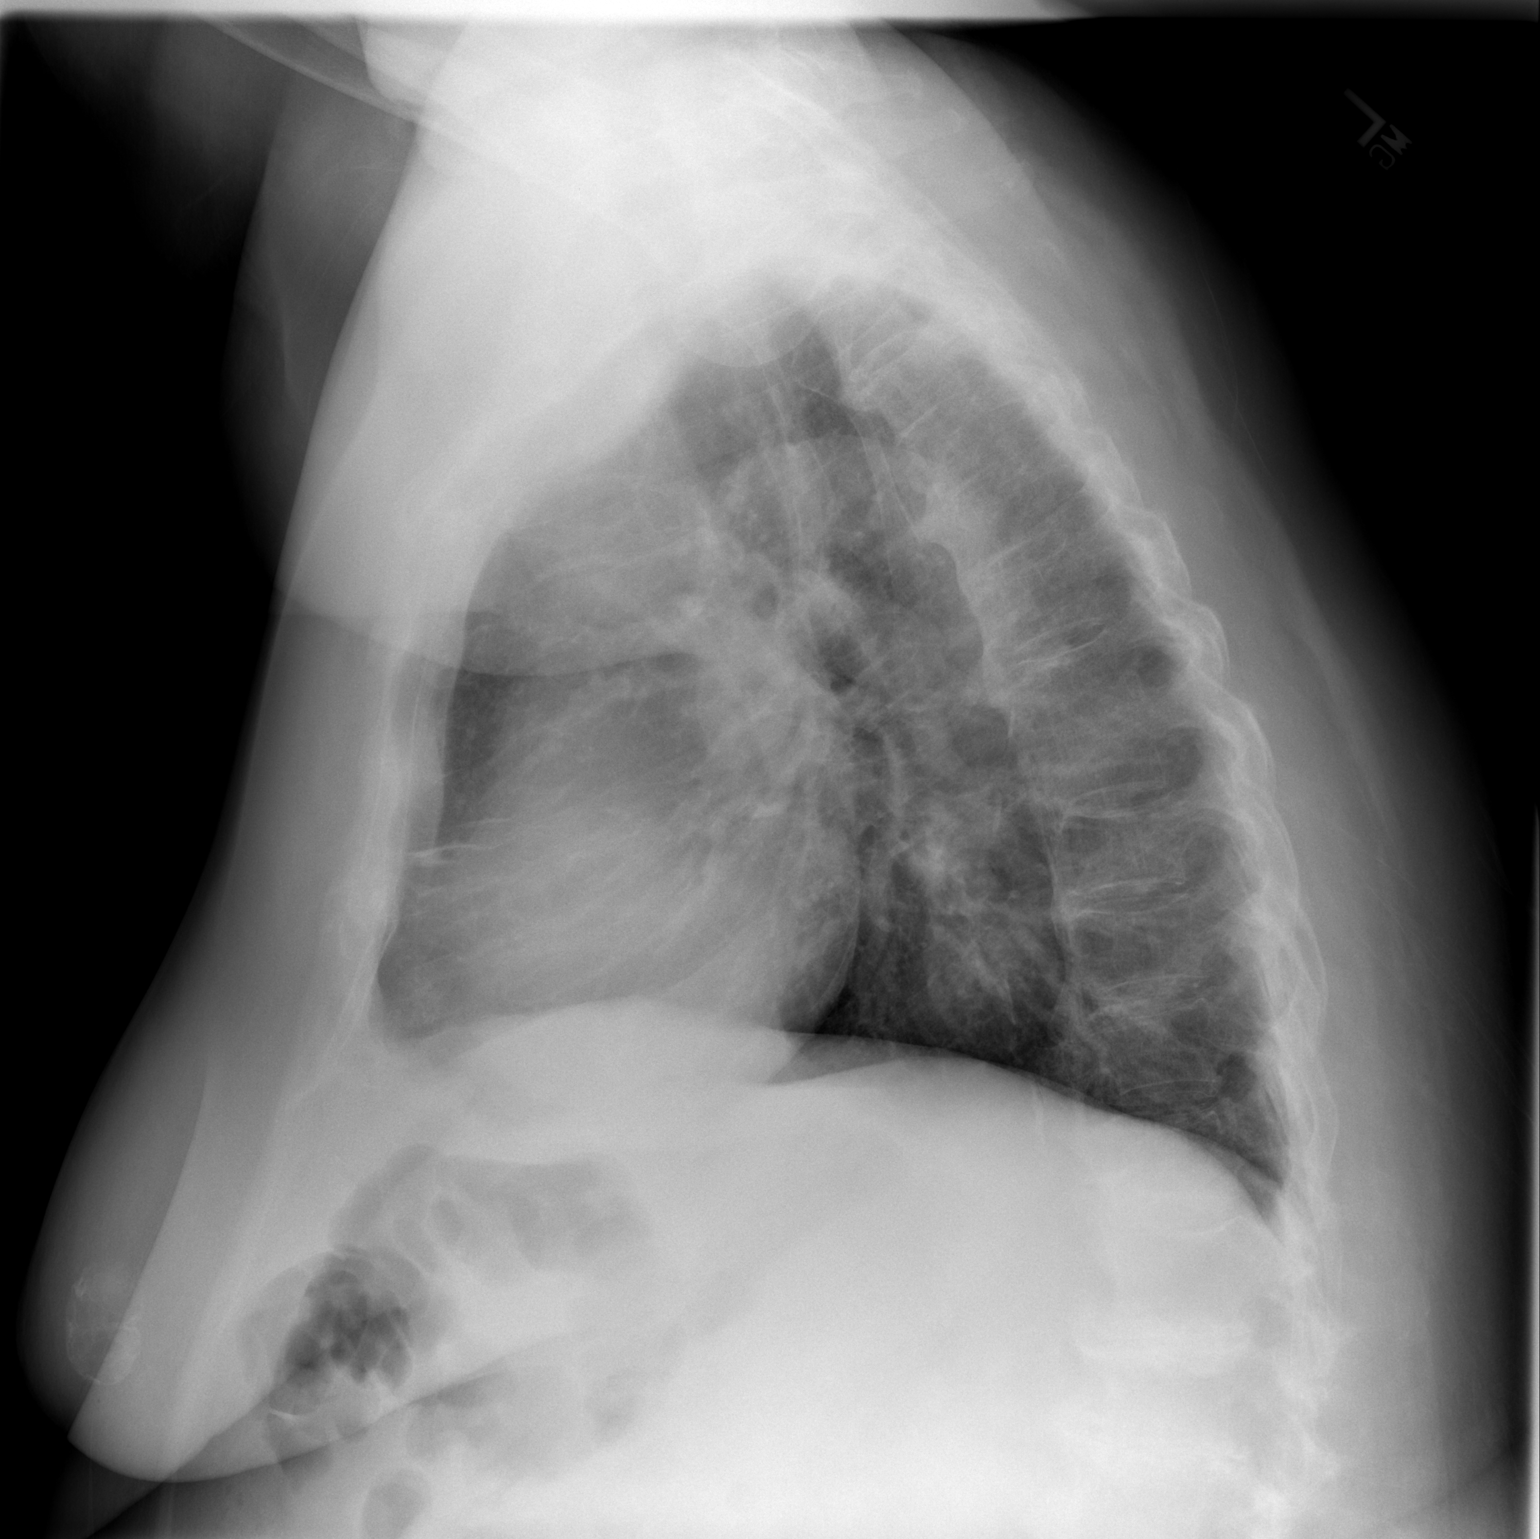

[2 of 2 positions shown; findings below may reference images not displayed]

FINDINGS: Normal sized heart.  Stable mildly hyperexpanded lungs
with mild diffuse peribronchial thickening.  No airspace
consolidation.  Diffuse osteopenia.  Mild scoliosis.
IMPRESSION: Stable mild changes of COPD and chronic bronchitis.  No acute
abnormality.

## 2012-02-25 ENCOUNTER — Other Ambulatory Visit: Payer: Self-pay | Admitting: Physician Assistant

## 2012-02-25 DIAGNOSIS — Z1231 Encounter for screening mammogram for malignant neoplasm of breast: Secondary | ICD-10-CM

## 2012-02-28 ENCOUNTER — Other Ambulatory Visit: Payer: Self-pay | Admitting: Physician Assistant

## 2012-02-28 ENCOUNTER — Other Ambulatory Visit: Payer: Self-pay | Admitting: Family Medicine

## 2012-02-28 DIAGNOSIS — Z780 Asymptomatic menopausal state: Secondary | ICD-10-CM

## 2012-02-28 DIAGNOSIS — Z1231 Encounter for screening mammogram for malignant neoplasm of breast: Secondary | ICD-10-CM

## 2012-03-12 ENCOUNTER — Ambulatory Visit
Admission: RE | Admit: 2012-03-12 | Discharge: 2012-03-12 | Disposition: A | Discharge status: Home / Self Care | Payer: Medicare Other | Source: Ambulatory Visit | Attending: Family Medicine | Admitting: Family Medicine

## 2012-03-12 DIAGNOSIS — Z780 Asymptomatic menopausal state: Secondary | ICD-10-CM

## 2012-03-12 DIAGNOSIS — Z1231 Encounter for screening mammogram for malignant neoplasm of breast: Secondary | ICD-10-CM

## 2012-04-27 ENCOUNTER — Other Ambulatory Visit: Payer: Self-pay | Admitting: Family Medicine

## 2012-04-27 ENCOUNTER — Ambulatory Visit
Admission: RE | Admit: 2012-04-27 | Discharge: 2012-04-27 | Disposition: A | Discharge status: Home / Self Care | Payer: Medicare Other | Source: Ambulatory Visit | Attending: Family Medicine | Admitting: Family Medicine

## 2012-04-27 DIAGNOSIS — R05 Cough: Secondary | ICD-10-CM

## 2012-04-29 DIAGNOSIS — G4730 Sleep apnea, unspecified: Secondary | ICD-10-CM

## 2012-04-29 HISTORY — DX: Sleep apnea, unspecified: G47.30

## 2012-05-09 IMAGING — CR DG WRIST COMPLETE 3+V*L*
4 series · 4 of 4 positions shown · non-contrast
Comparison: None.

CLINICAL DATA: Fall.  Wrist injury and pain.

LEFT WRIST - COMPLETE 3+ VIEW

[x wrist pa left]
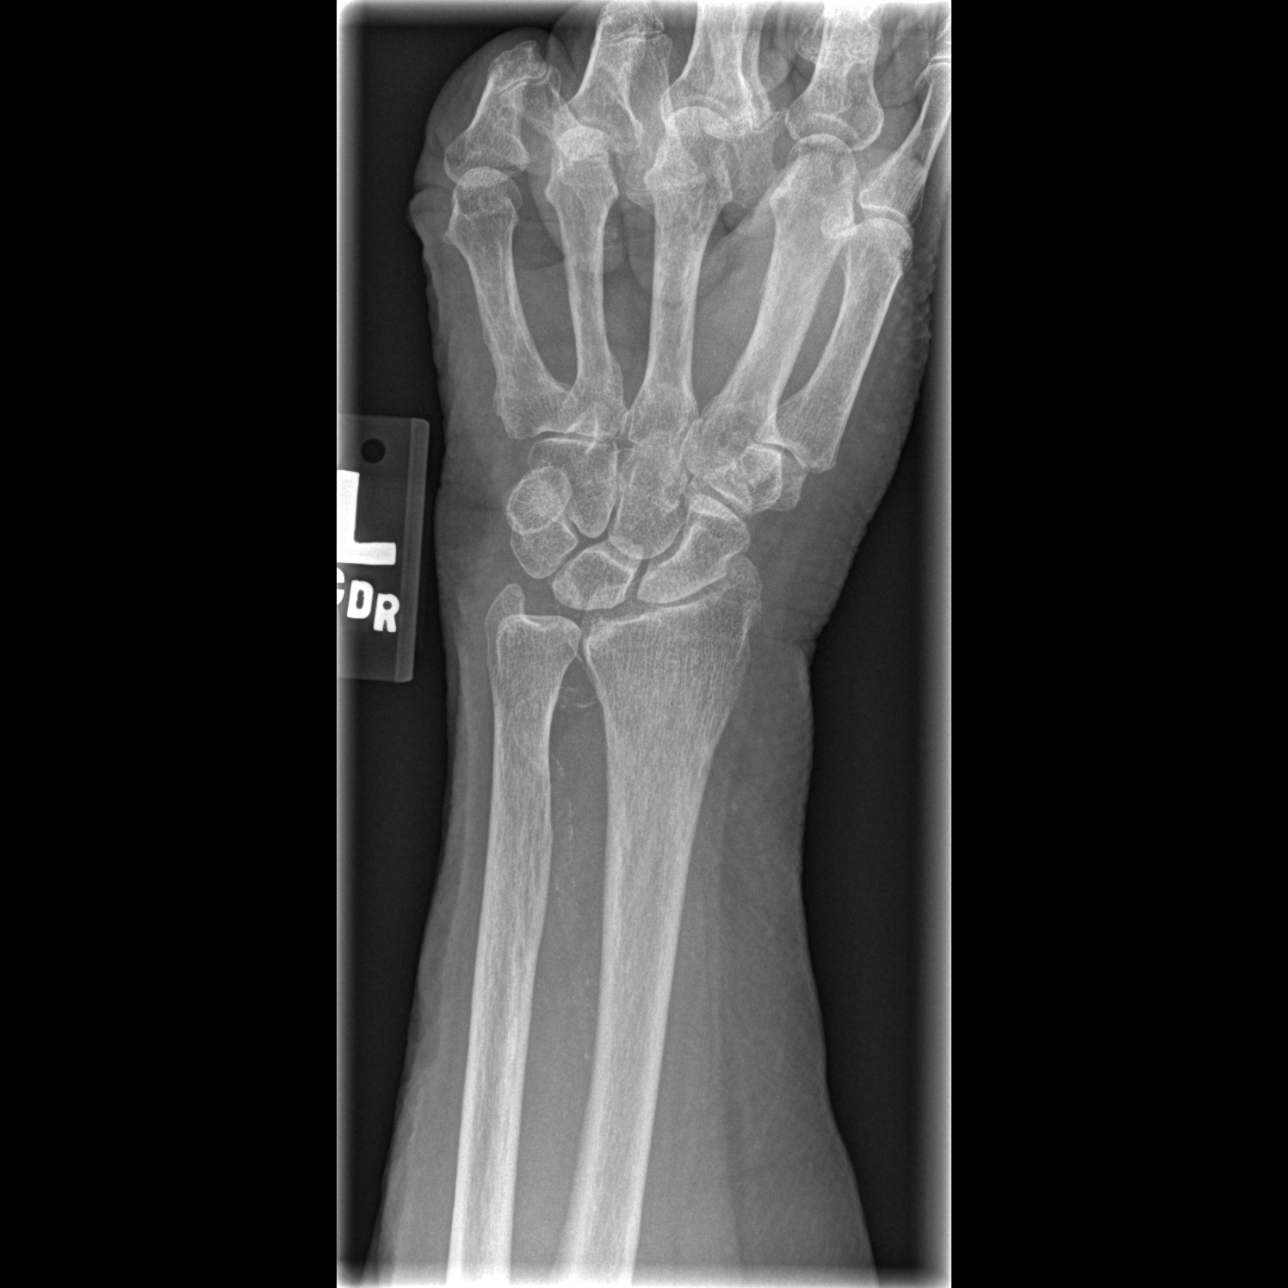

[x wrist obl left]
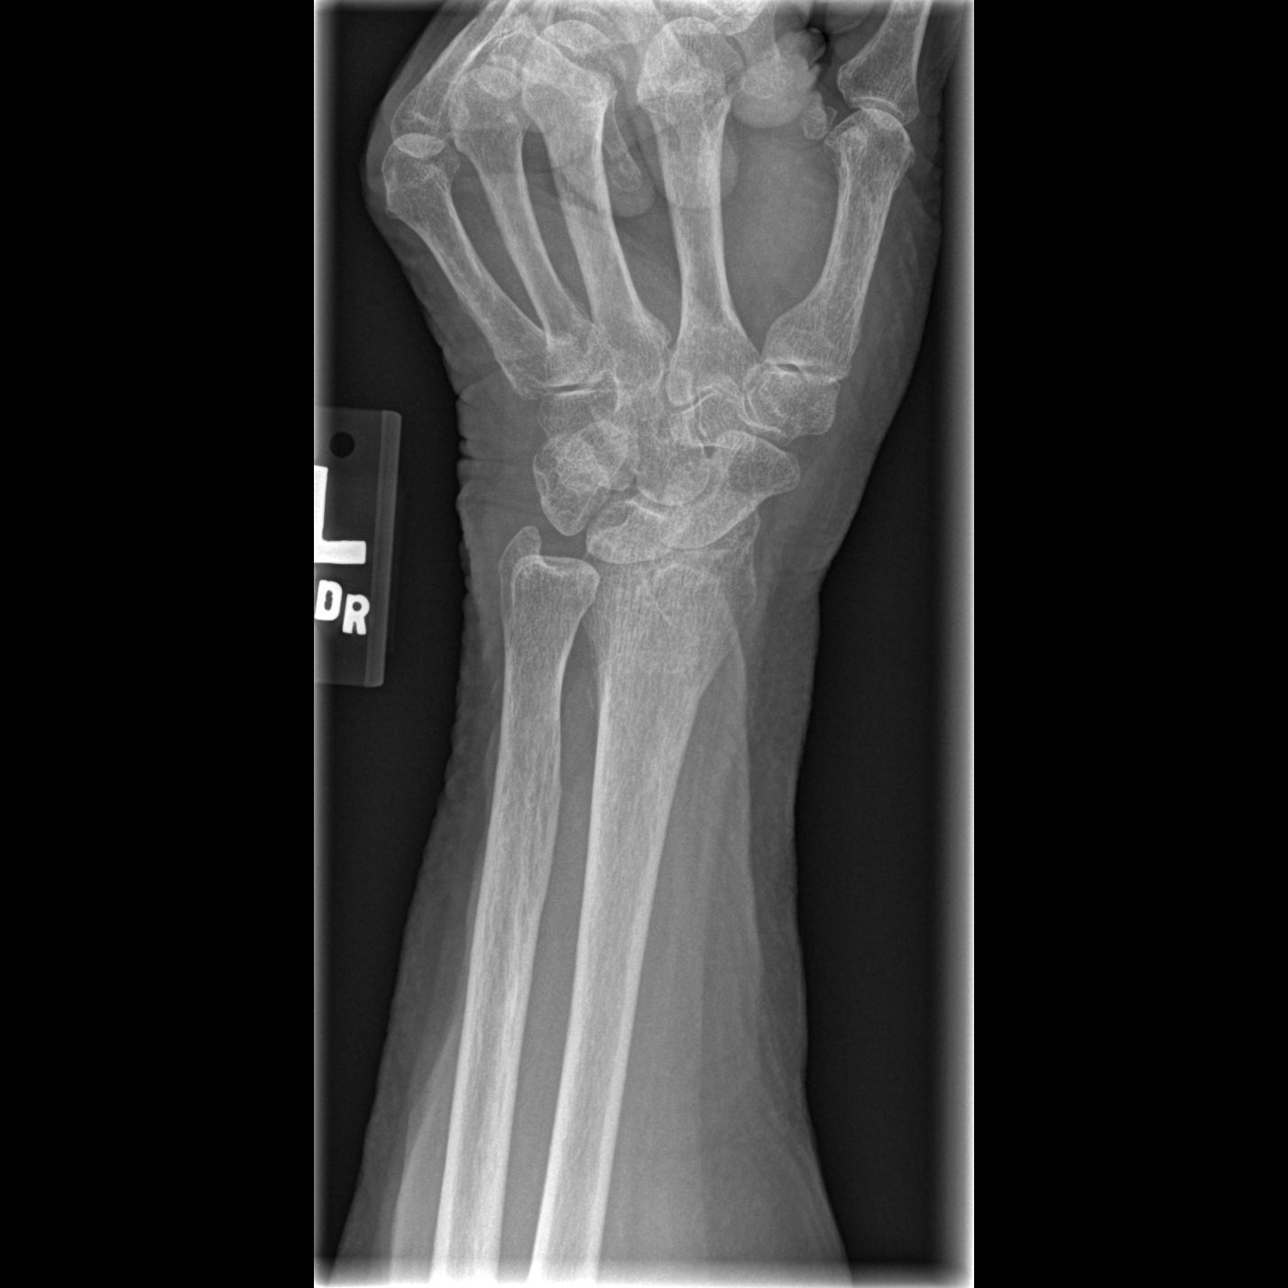

[x wrist lat left]
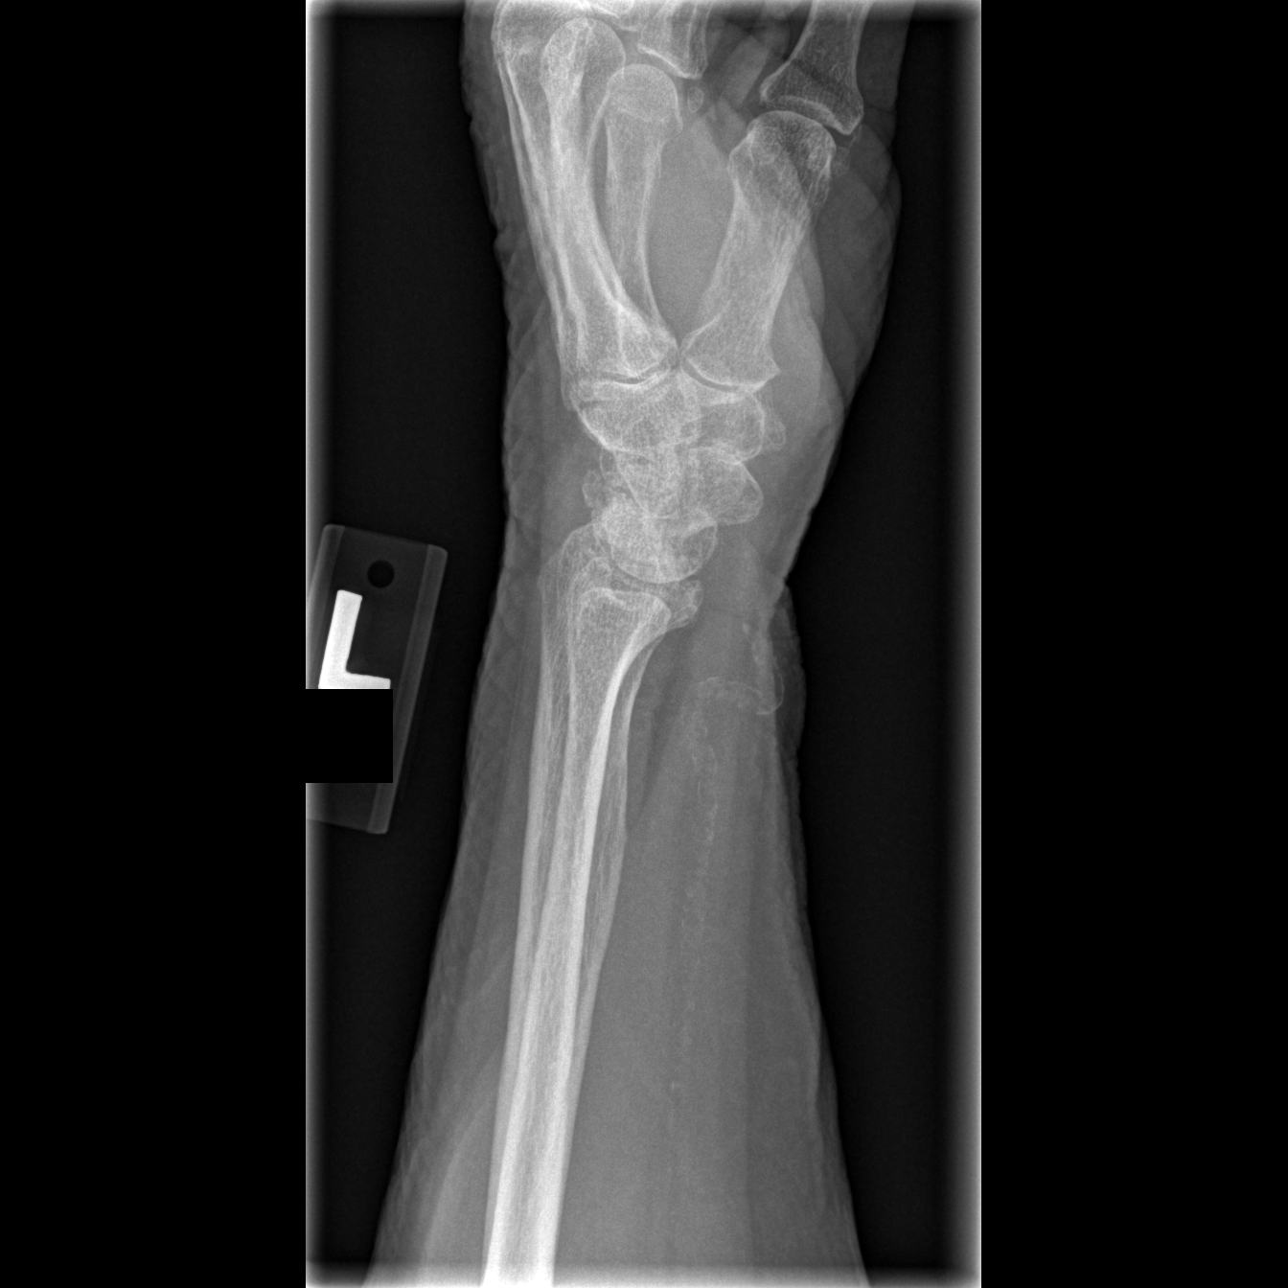

[x navicular]
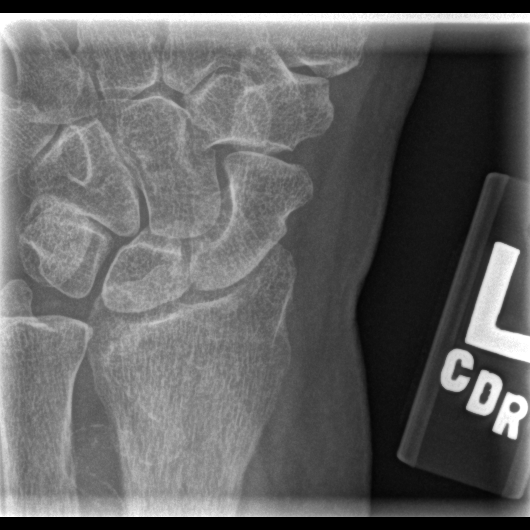

[4 of 4 positions shown; findings below may reference images not displayed]

FINDINGS: No evidence of fracture or dislocation.  Generalized
osteopenia is noted.  Mild degenerative changes are seen involving
the radiocarpal joint.  Peripheral vascular calcification also
noted.
IMPRESSION: 1.  No acute findings.
2.  Osteopenia.
2.  Mild radiocarpal degenerative changes.

## 2012-05-09 IMAGING — CR DG CHEST 2V
2 series · 2 of 2 positions shown · non-contrast
Comparison: 04/13/2010

CLINICAL DATA: Recent fall.  Right-sided chest pain.

CHEST - 2 VIEW

[w chest pa]
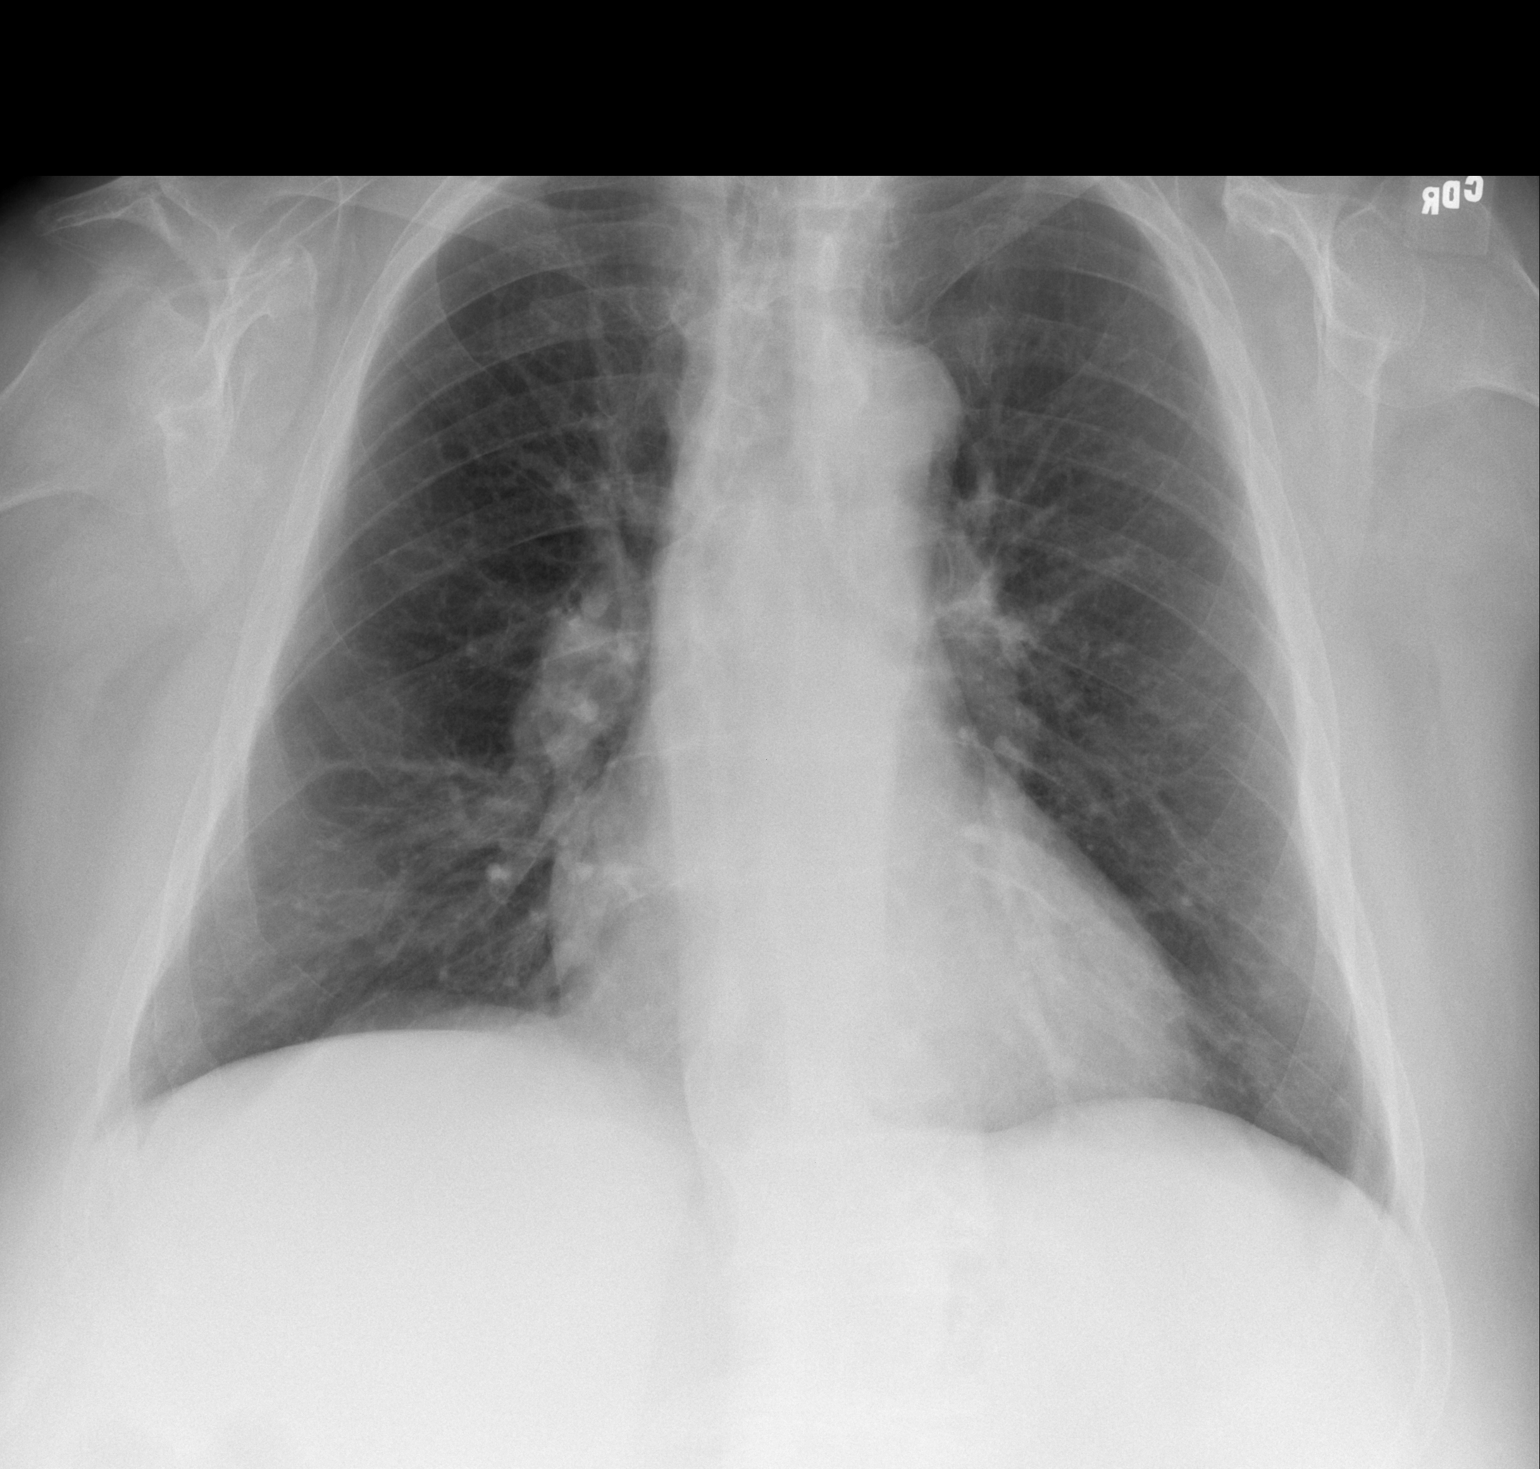

[w chest lat]
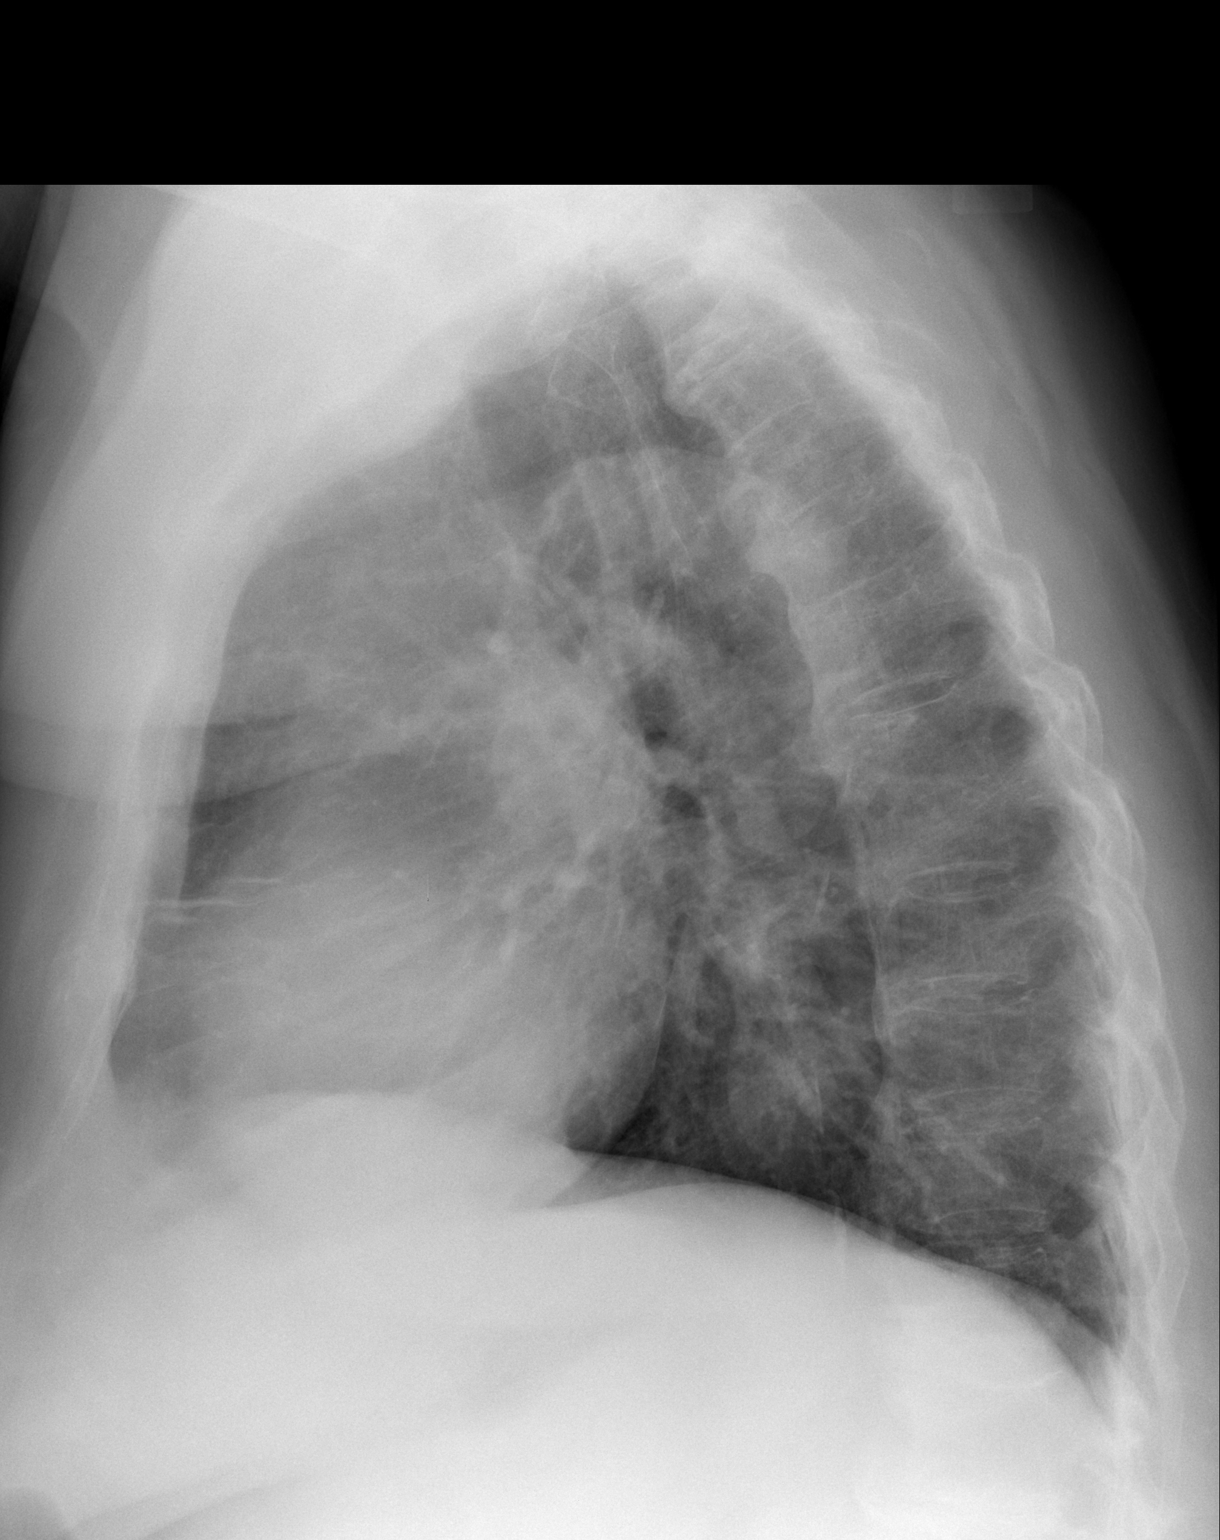

[2 of 2 positions shown; findings below may reference images not displayed]

FINDINGS: Heart size remains at the upper limits of normal.  Both
lungs are clear.  Mild scarring again seen in the right middle
lobe.  No evidence of pulmonary infiltrate or edema.  No evidence
of pleural effusion or pneumothorax.  No mass or adenopathy
identified.
IMPRESSION: Stable exam.  No active disease.

## 2012-06-10 IMAGING — MG MM DIGITAL DIAGNOSTIC BILAT
5 series · 5 of 5 positions shown · non-contrast
Comparison: [DATE] [DATE], [DATE], [DATE] [DATE], [DATE], [DATE] [DATE],
[DATE]

CLINICAL DATA: Palpable lump in the lower inner left breast for
years

DIGITAL DIAGNOSTIC BILATERAL MAMMOGRAM

[R CC]
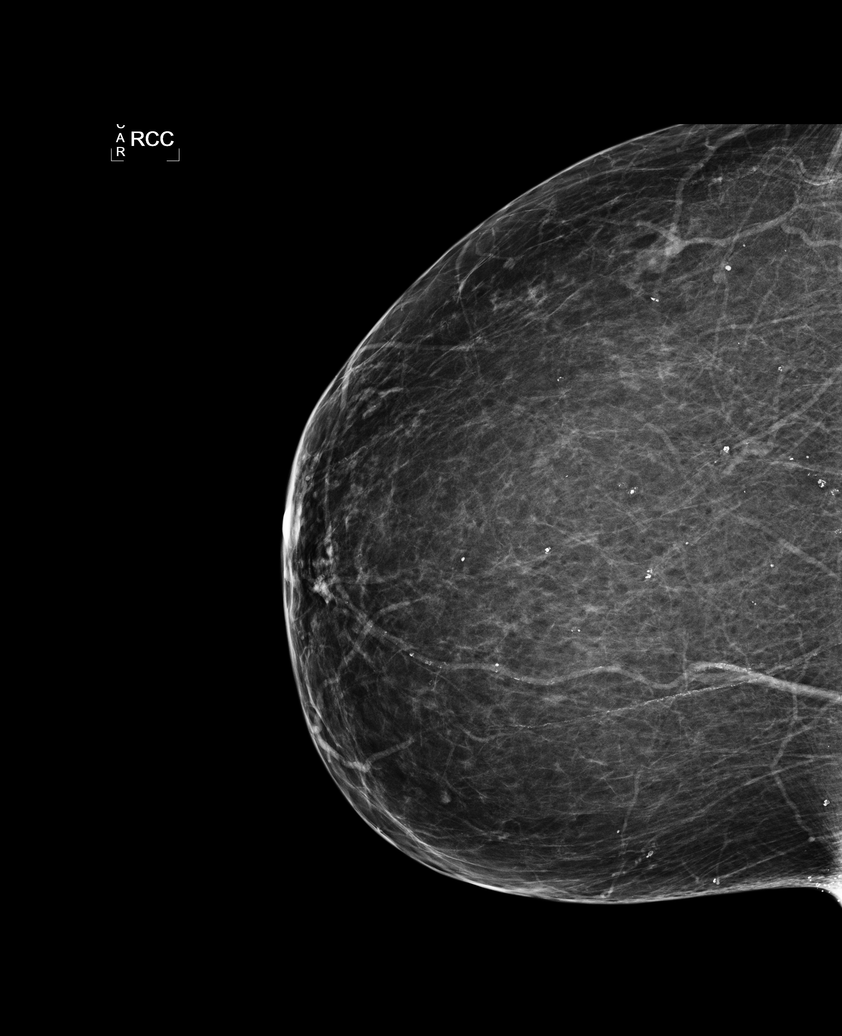

[L CC]
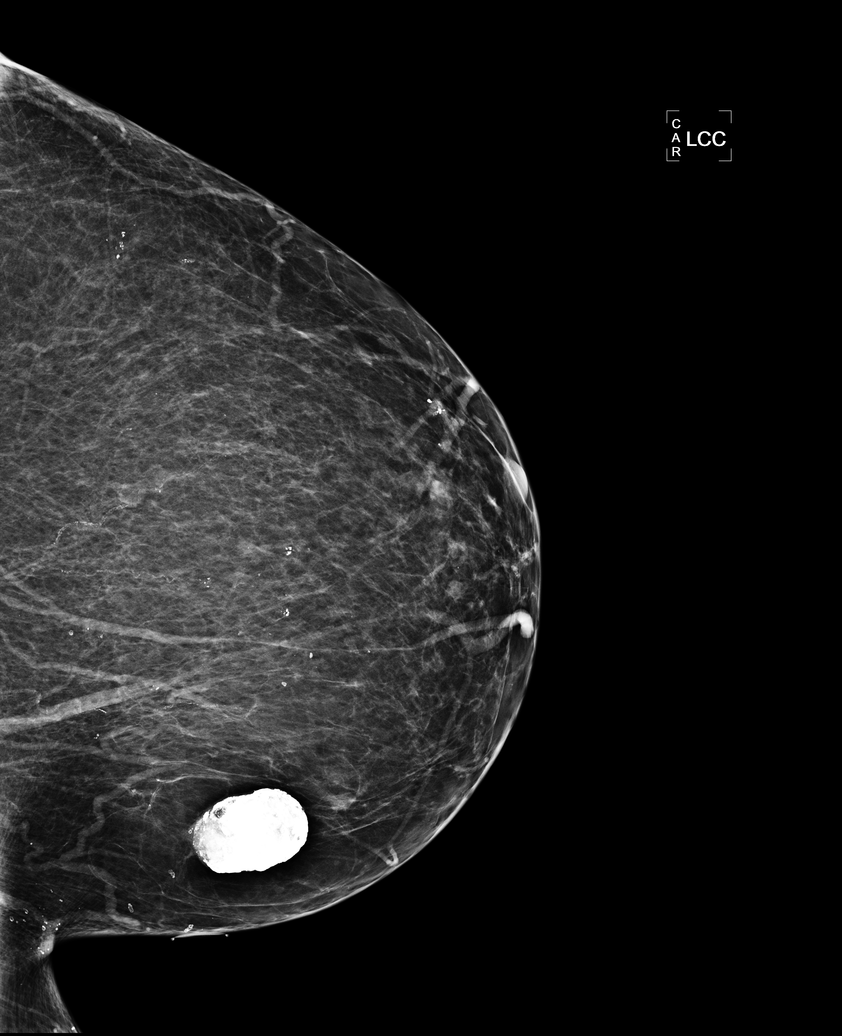

[L MLO]
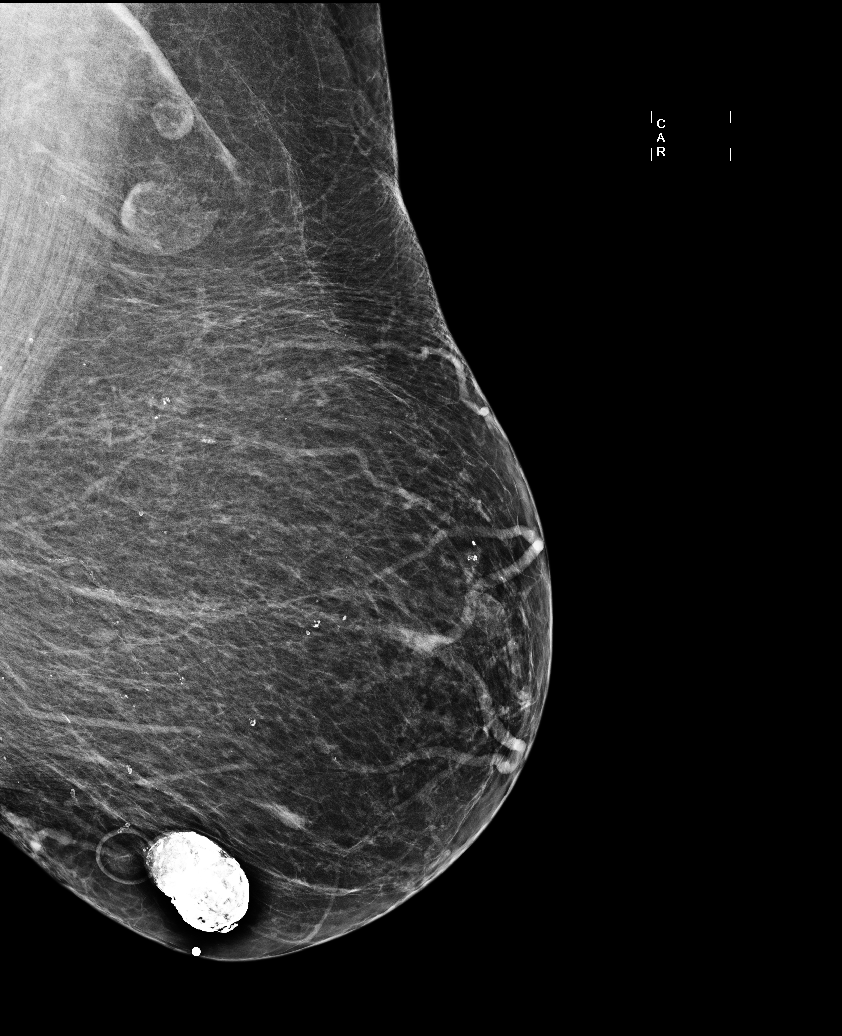

[R MLO]
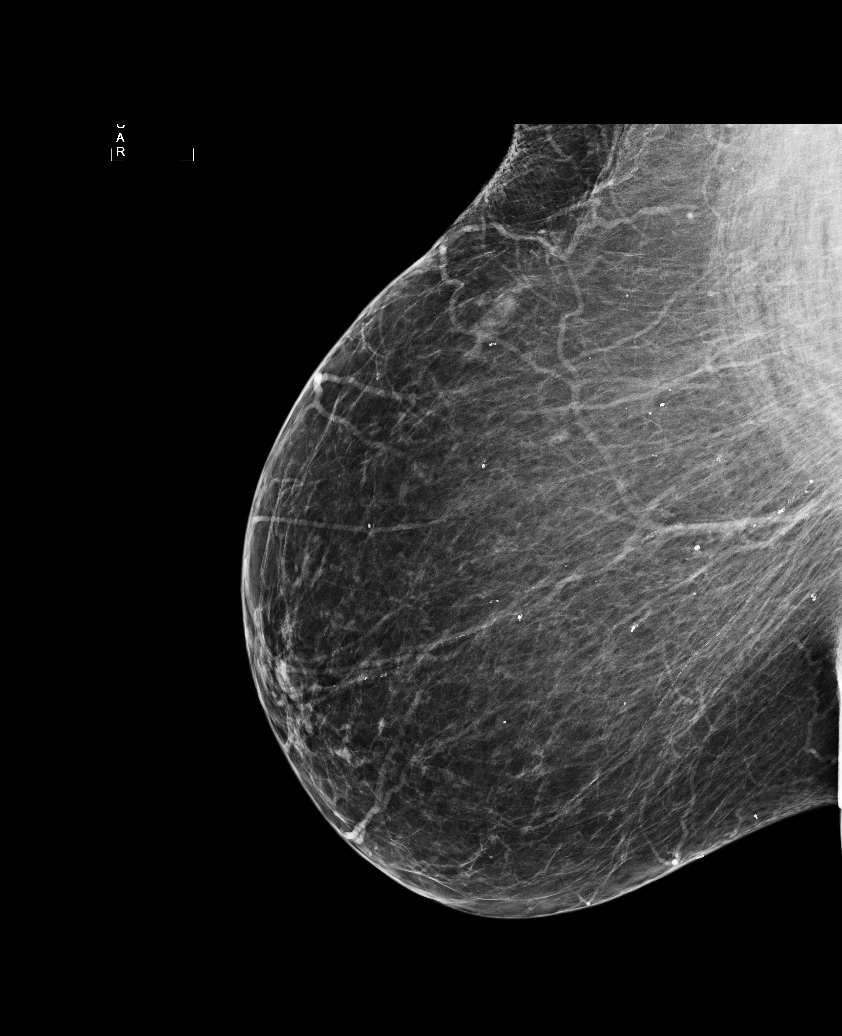

[L TAN]
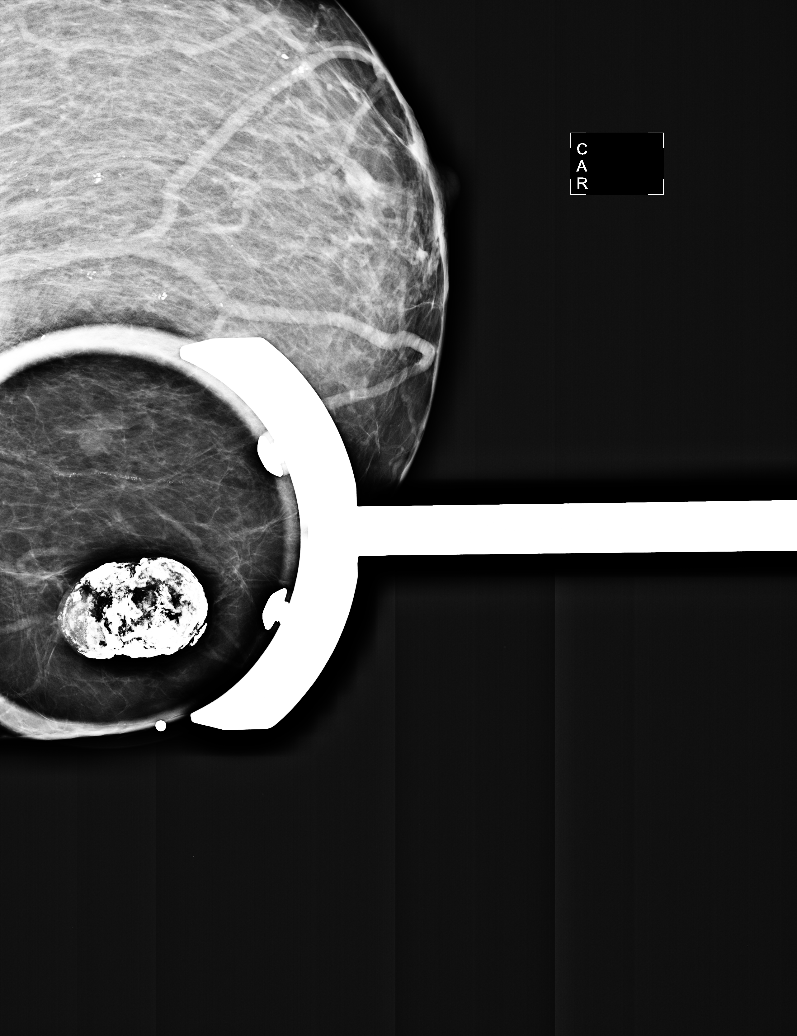

[5 of 5 positions shown; findings below may reference images not displayed]

FINDINGS: Scattered fibroglandular densities are symmetric and
stable.  There is a densely calcified fibroadenoma in the lower
inner left breast which correlates with the patient's region of
palpable concern.  This is unchanged in size.  No suspicious
masses, calcifications, or architectural distortion are present
within either breast.
IMPRESSION: There is no radiographic evidence of malignancy bilaterally.
Screening mammogram in 1 year recommended.

BI-RADS CATEGORY 2:  Benign finding(s).

## 2013-03-05 ENCOUNTER — Encounter: Payer: Self-pay | Admitting: Family Medicine

## 2013-03-05 DIAGNOSIS — I10 Essential (primary) hypertension: Secondary | ICD-10-CM | POA: Insufficient documentation

## 2013-03-16 ENCOUNTER — Telehealth: Payer: Self-pay | Admitting: Family Medicine

## 2013-03-16 MED ORDER — LEVOTHYROXINE SODIUM 137 MCG PO TABS: 137.0000 ug | ORAL_TABLET | Freq: Every day | ORAL | Status: DC

## 2013-03-16 NOTE — Telephone Encounter (Signed)
Medication refilled per protocol. 

## 2013-04-13 ENCOUNTER — Other Ambulatory Visit: Payer: Self-pay | Admitting: Family Medicine

## 2013-04-14 ENCOUNTER — Other Ambulatory Visit: Payer: Self-pay | Admitting: Family Medicine

## 2013-04-19 ENCOUNTER — Other Ambulatory Visit: Payer: Self-pay | Admitting: Family Medicine

## 2013-04-20 NOTE — Telephone Encounter (Signed)
Medication refilled per protocol. 

## 2013-04-30 ENCOUNTER — Encounter: Payer: Self-pay | Admitting: Family Medicine

## 2013-04-30 ENCOUNTER — Ambulatory Visit (INDEPENDENT_AMBULATORY_CARE_PROVIDER_SITE_OTHER): Payer: BC Managed Care – HMO | Admitting: Family Medicine

## 2013-04-30 VITALS — BP 140/70 | HR 64 | Temp 97.8°F | Resp 16 | Ht <= 58 in | Wt 193.0 lb

## 2013-04-30 DIAGNOSIS — I10 Essential (primary) hypertension: Secondary | ICD-10-CM

## 2013-04-30 DIAGNOSIS — Z1239 Encounter for other screening for malignant neoplasm of breast: Secondary | ICD-10-CM

## 2013-04-30 DIAGNOSIS — E785 Hyperlipidemia, unspecified: Secondary | ICD-10-CM

## 2013-04-30 DIAGNOSIS — Z1231 Encounter for screening mammogram for malignant neoplasm of breast: Secondary | ICD-10-CM

## 2013-04-30 DIAGNOSIS — E079 Disorder of thyroid, unspecified: Secondary | ICD-10-CM

## 2013-04-30 LAB — BASIC METABOLIC PANEL
BUN: 16 mg/dL (ref 6–23)
Chloride: 105 mEq/L (ref 96–112)
Glucose, Bld: 103 mg/dL — ABNORMAL HIGH (ref 70–99)
Potassium: 4.4 mEq/L (ref 3.5–5.3)

## 2013-04-30 LAB — LIPID PANEL
Cholesterol: 141 mg/dL (ref 0–200)
HDL: 43 mg/dL (ref >39)
LDL Cholesterol: 64 mg/dL (ref 0–99)
Total CHOL/HDL Ratio: 3.3 Ratio
Triglycerides: 171 mg/dL — ABNORMAL HIGH (ref <150)
VLDL: 34 mg/dL (ref 0–40)

## 2013-04-30 LAB — HEPATIC FUNCTION PANEL
ALT: 9 U/L (ref 0–35)
AST: 15 U/L (ref 0–37)
Albumin: 4 g/dL (ref 3.5–5.2)
Total Bilirubin: 0.7 mg/dL (ref 0.3–1.2)
Total Protein: 6.4 g/dL (ref 6.0–8.3)

## 2013-04-30 LAB — TSH: TSH: 2.005 u[IU]/mL (ref 0.350–4.500)

## 2013-04-30 NOTE — Progress Notes (Signed)
Subjective:    Patient ID: [name], female    DOB: 12-23-1933, 79 y.o.   MRN: [id number]  HPI Problem #1 hypertension. Patient's current Norvasc 10 mg by mouth daily and Maxzide 37.5/25 by mouth daily. He denies chest pain, shortness of breath, or dyspnea on exertion. Her blood pressure is borderline today. She reports that she had an excessive amount of salt with a meal yesterday.  #2 is hyperlipidemia. She is currently on pravastatin 20 mg by mouth daily. She denies myalgias a right upper quadrant pain. She does have occasional arthralgias which respond to Mobic or occasional when necessary tramadol.  #3 is hypothyroidism. She's currently on levothyroxine 137 mcg by mouth daily. She denies any recent change in her weight patient denies any fatigue. She denies any mood symptoms. Past Medical History  Diagnosis Date  . Hyperlipidemia   . Hypertension   . Hypothyroid   . Sleep apnea 04/2012    Mild/ AHI 10/CPAP 7cm h2o w/2 L o2  . Osteoporosis    Current Outpatient Prescriptions on File Prior to Visit  Medication Sig Dispense Refill  . amLODipine (NORVASC) 10 MG tablet TAKE 1 TABLET EVERY DAY  90 tablet  1  . calcium carbonate 1250 MG capsule Take 1,250 mg by mouth 2 (two) times daily with a meal.      . EVISTA 60 MG tablet TAKE 1 TABLET BY MOUTH EVERY DAY  30 tablet  5  . levothyroxine (SYNTHROID, LEVOTHROID) 137 MCG tablet Take 1 tablet (137 mcg total) by mouth daily.  30 tablet  5  . meloxicam (MOBIC) 7.5 MG tablet Take 15 mg by mouth daily.      Marland Kitchen omeprazole (PRILOSEC) 20 MG capsule Take 20 mg by mouth daily.      . pravastatin (PRAVACHOL) 20 MG tablet Take 20 mg by mouth daily.      Marland Kitchen triamterene-hydrochlorothiazide (MAXZIDE-25) 37.5-25 MG per tablet Take 1 tablet by mouth daily.       No current facility-administered medications on file prior to visit.   Allergies  Allergen Reactions  . Fosamax (Alendronate Sodium)    History   Social History  . Marital Status:  Married    Spouse Name: N/A    Number of Children: N/A  . Years of Education: N/A   Occupational History  . Not on file.   Social History Main Topics  . Smoking status: Never Smoker   . Smokeless tobacco: Not on file  . Alcohol Use: Not on file  . Drug Use: Not on file  . Sexually Active: Not on file   Other Topics Concern  . Not on file   Social History Narrative  . No narrative on file      Review of Systems  All other systems reviewed and are negative.       Objective:   Physical Exam  Constitutional: She appears well-developed and well-nourished.  Eyes: Conjunctivae and EOM are normal. Pupils are equal, round, and reactive to light.  Neck: Neck supple. No JVD present. No thyromegaly present.  Cardiovascular: Normal rate, regular rhythm and intact distal pulses.   Murmur (2/6 systolic ejection murmur) heard. Pulmonary/Chest: Effort normal and breath sounds normal. No respiratory distress. She has no wheezes. She has no rales.  Abdominal: Soft. Bowel sounds are normal. She exhibits no distension. There is no tenderness. There is no rebound.          Assessment & Plan:  1. Hyperlipidemia Goal LDL is less than 130.  Check fasting lipid panel. - Hepatic Function Panel - Lipid Panel  2. Hypertension Pressure is borderline today. I advised the patient to avoid salt. We will check her blood pressure more frequently. Her goal is less than 140/90 - Basic Metabolic Panel  3. Thyroid disease Currently asymptomatic, check TSH and titrate medications if necessary to - TSH

## 2013-06-02 ENCOUNTER — Telehealth: Payer: Self-pay | Admitting: Family Medicine

## 2013-06-02 MED ORDER — TRIAMTERENE-HCTZ 37.5-25 MG PO TABS: 1.0000 | ORAL_TABLET | Freq: Every day | ORAL | Status: DC

## 2013-06-02 MED ORDER — MELOXICAM 7.5 MG PO TABS: 15.0000 mg | ORAL_TABLET | Freq: Every day | ORAL | Status: DC

## 2013-06-02 NOTE — Telephone Encounter (Signed)
Medication refilled per protocol. 

## 2013-06-03 ENCOUNTER — Telehealth: Payer: Self-pay | Admitting: Family Medicine

## 2013-06-03 MED ORDER — RALOXIFENE HCL 60 MG PO TABS: 60.0000 mg | ORAL_TABLET | Freq: Every day | ORAL | Status: DC

## 2013-06-03 NOTE — Telephone Encounter (Signed)
Medication refilled per protocol. 

## 2013-06-08 ENCOUNTER — Ambulatory Visit
Admission: RE | Admit: 2013-06-08 | Discharge: 2013-06-08 | Disposition: A | Discharge status: Home / Self Care | Payer: Medicare Other | Source: Ambulatory Visit | Attending: Family Medicine | Admitting: Family Medicine

## 2013-06-08 DIAGNOSIS — Z1231 Encounter for screening mammogram for malignant neoplasm of breast: Secondary | ICD-10-CM

## 2013-06-09 ENCOUNTER — Other Ambulatory Visit: Payer: Self-pay | Admitting: Family Medicine

## 2013-06-15 ENCOUNTER — Telehealth: Payer: Self-pay | Admitting: Family Medicine

## 2013-06-15 ENCOUNTER — Other Ambulatory Visit: Payer: Self-pay | Admitting: Family Medicine

## 2013-06-15 NOTE — Telephone Encounter (Signed)
?   OK to Refill  

## 2013-06-15 NOTE — Telephone Encounter (Signed)
Ok to refill 

## 2013-06-16 MED ORDER — TRAMADOL HCL 50 MG PO TABS: 50.0000 mg | ORAL_TABLET | Freq: Four times a day (QID) | ORAL | Status: DC | PRN

## 2013-06-16 NOTE — Telephone Encounter (Signed)
Rx Refilled  

## 2013-06-30 ENCOUNTER — Other Ambulatory Visit: Payer: Self-pay | Admitting: Family Medicine

## 2013-07-07 ENCOUNTER — Other Ambulatory Visit: Payer: Self-pay | Admitting: Family Medicine

## 2013-07-08 ENCOUNTER — Telehealth: Payer: Self-pay | Admitting: Family Medicine

## 2013-07-08 NOTE — Telephone Encounter (Signed)
Ok to refill 

## 2013-07-08 NOTE — Telephone Encounter (Signed)
Ok to refill x 3 

## 2013-07-09 ENCOUNTER — Telehealth: Payer: Self-pay | Admitting: Family Medicine

## 2013-07-09 MED ORDER — ALPRAZOLAM 0.5 MG PO TABS: 0.5000 mg | ORAL_TABLET | Freq: Three times a day (TID) | ORAL | Status: DC

## 2013-07-09 NOTE — Telephone Encounter (Signed)
[facility]

## 2013-07-09 NOTE — Telephone Encounter (Signed)
This was done today 

## 2013-07-23 ENCOUNTER — Telehealth: Payer: Self-pay | Admitting: Family Medicine

## 2013-07-23 MED ORDER — HYDROCODONE-ACETAMINOPHEN 5-325 MG PO TABS: 1.0000 | ORAL_TABLET | Freq: Four times a day (QID) | ORAL | Status: DC | PRN

## 2013-07-23 NOTE — Telephone Encounter (Signed)
..  Patient aware and med c/o and appt made

## 2013-07-23 NOTE — Telephone Encounter (Signed)
Tell patient to stop tramadol and try norco 5/325 poq 6 hrs prn pain (30).  I'd like to see her next week for cortisone shot in shoulder.

## 2013-07-27 ENCOUNTER — Encounter: Payer: Self-pay | Admitting: Family Medicine

## 2013-07-27 ENCOUNTER — Ambulatory Visit (INDEPENDENT_AMBULATORY_CARE_PROVIDER_SITE_OTHER): Payer: BC Managed Care – HMO | Admitting: Family Medicine

## 2013-07-27 VITALS — BP 118/74 | HR 78 | Temp 98.3°F | Resp 20 | Wt 177.0 lb

## 2013-07-27 DIAGNOSIS — M67919 Unspecified disorder of synovium and tendon, unspecified shoulder: Secondary | ICD-10-CM

## 2013-07-27 DIAGNOSIS — M7581 Other shoulder lesions, right shoulder: Secondary | ICD-10-CM

## 2013-07-27 DIAGNOSIS — M719 Bursopathy, unspecified: Secondary | ICD-10-CM

## 2013-07-27 NOTE — Progress Notes (Signed)
Subjective:    Patient ID: [name], female    DOB: August 09, 1933, 79 y.o.   MRN: [id number]  HPI Patient complains of pain in both shoulders right greater than left. It hurts with abduction greater than 90 or. It hurts with internal and external rotation. It throbs and aches at night. She is currently taking Norco which will relieve the pain somewhat. This has been gradually worsening over the last several months. She denies any specific injury. She denies any falls. Past Medical History  Diagnosis Date  . Hyperlipidemia   . Hypertension   . Hypothyroid   . Sleep apnea 04/2012    Mild/ AHI 10/CPAP 7cm h2o w/2 L o2  . Osteoporosis    Current Outpatient Prescriptions on File Prior to Visit  Medication Sig Dispense Refill  . ALPRAZolam (XANAX) 0.5 MG tablet Take 1 tablet (0.5 mg total) by mouth every 8 (eight) hours.  30 tablet  3  . amLODipine (NORVASC) 10 MG tablet TAKE 1 TABLET EVERY DAY  90 tablet  1  . calcium carbonate 1250 MG capsule Take 1,250 mg by mouth 2 (two) times daily with a meal.      . HYDROcodone-acetaminophen (NORCO/VICODIN) 5-325 MG per tablet Take 1 tablet by mouth every 6 (six) hours as needed for pain.  30 tablet  0  . levothyroxine (SYNTHROID, LEVOTHROID) 137 MCG tablet Take 1 tablet (137 mcg total) by mouth daily.  30 tablet  5  . meloxicam (MOBIC) 7.5 MG tablet Take 2 tablets (15 mg total) by mouth daily.  60 tablet  2  . metoprolol (LOPRESSOR) 50 MG tablet TAKE 1/2 TABLET ONCE DAILY  90 tablet  2  . omeprazole (PRILOSEC) 20 MG capsule Take 20 mg by mouth daily.      . pravastatin (PRAVACHOL) 20 MG tablet Take 20 mg by mouth daily.      . raloxifene (EVISTA) 60 MG tablet Take 1 tablet (60 mg total) by mouth daily.  30 tablet  5  . triamterene-hydrochlorothiazide (MAXZIDE-25) 37.5-25 MG per tablet Take 1 tablet by mouth daily.  30 tablet  2   No current facility-administered medications on file prior to visit.   Allergies  Allergen Reactions  . Fosamax  (Alendronate Sodium)    History   Social History  . Marital Status: Married    Spouse Name: N/A    Number of Children: N/A  . Years of Education: N/A   Occupational History  . Not on file.   Social History Main Topics  . Smoking status: Never Smoker   . Smokeless tobacco: Not on file  . Alcohol Use: Not on file  . Drug Use: Not on file  . Sexually Active: Not on file   Other Topics Concern  . Not on file   Social History Narrative  . No narrative on file      Review of Systems  All other systems reviewed and are negative.       Objective:   Physical Exam  Vitals reviewed. Cardiovascular: Normal rate and regular rhythm.   Pulmonary/Chest: Effort normal and breath sounds normal.  Musculoskeletal:       Right shoulder: She exhibits decreased range of motion and tenderness. She exhibits no bony tenderness, no effusion and no crepitus.   she has a positive empty can sign. She has a positive Hawkins sign. She has a negative Yergason sign in the right shoulder.        Assessment & Plan:  1. Rotator cuff  tendonitis, right Using sterile technique a mixture of 2 cc of 0.1% lidocaine, 2 cc of Marcaine, and 2 cc of 40 mg per mL Kenalog was injected into the right subacromial space. The patient tolerated the procedure well without complications. She is to follow up in one week if no better or sooner if worse.

## 2013-07-31 ENCOUNTER — Other Ambulatory Visit: Payer: Self-pay | Admitting: Family Medicine

## 2013-08-02 NOTE — Telephone Encounter (Signed)
?   OK to Refill  

## 2013-08-03 NOTE — Telephone Encounter (Signed)
ok 

## 2013-08-03 NOTE — Telephone Encounter (Signed)
rx called in

## 2013-08-18 ENCOUNTER — Telehealth: Payer: Self-pay | Admitting: Family Medicine

## 2013-08-18 NOTE — Telephone Encounter (Signed)
Informed pt to stop the pravastatin and continue taking her omeprazole and if stomach is not better by Friday to call us back. She also stated that with her stomach hurting like that it is tearing her nerves up and wants something for her nerves. I ask her if she still had xanax as we just fill these on 07/31/13 and she did so I told her to take a xanax and it would help her nerves. Pt verbalized understanding and will call back if not better on Friday.

## 2013-08-24 ENCOUNTER — Other Ambulatory Visit: Payer: Self-pay | Admitting: Family Medicine

## 2013-08-25 ENCOUNTER — Other Ambulatory Visit: Payer: Self-pay | Admitting: Family Medicine

## 2013-08-29 ENCOUNTER — Other Ambulatory Visit: Payer: Self-pay | Admitting: Family Medicine

## 2013-08-31 NOTE — Telephone Encounter (Signed)
Med phoned in °

## 2013-08-31 NOTE — Telephone Encounter (Signed)
ok 

## 2013-08-31 NOTE — Telephone Encounter (Signed)
Ok to refill 

## 2013-09-09 ENCOUNTER — Encounter: Payer: Self-pay | Admitting: Family Medicine

## 2013-09-09 ENCOUNTER — Ambulatory Visit (INDEPENDENT_AMBULATORY_CARE_PROVIDER_SITE_OTHER): Payer: BC Managed Care – HMO | Admitting: Family Medicine

## 2013-09-09 VITALS — BP 100/68 | HR 80 | Temp 98.6°F | Resp 14 | Wt 170.0 lb

## 2013-09-09 DIAGNOSIS — Z23 Encounter for immunization: Secondary | ICD-10-CM

## 2013-09-09 DIAGNOSIS — E039 Hypothyroidism, unspecified: Secondary | ICD-10-CM

## 2013-09-09 DIAGNOSIS — E785 Hyperlipidemia, unspecified: Secondary | ICD-10-CM

## 2013-09-09 DIAGNOSIS — I10 Essential (primary) hypertension: Secondary | ICD-10-CM

## 2013-09-09 NOTE — Progress Notes (Signed)
Subjective:    Patient ID: [name], female    DOB: 28-Aug-1933, 79 y.o.   MRN: [id number]  HPI  Patient has hypertension. She is currently on amlodipine 10 mg by mouth daily, Lopressor 50 mg by mouth twice a day, and Maxzide 37.5/25 by mouth daily. She denies any chest pain shortness of breath or dyspnea on exertion. She also has hyperlipidemia. She is current on Pravachol 20 mg by mouth daily. She denies any myalgias or right upper quadrant pain. She also has hypothyroidism. She is currently on Synthroid 137 mcg by mouth daily. She denies any fatigue, weight changes, hair loss, palpitations, or depression. She did recently experience a fall that she attributes to using the Xanax because of sleepiness. I warned the patient about taking the medication during the day. Past Medical History  Diagnosis Date  . Hyperlipidemia   . Hypertension   . Hypothyroid   . Sleep apnea 04/2012    Mild/ AHI 10/CPAP 7cm h2o w/2 L o2  . Osteoporosis    No past surgical history on file. Current Outpatient Prescriptions on File Prior to Visit  Medication Sig Dispense Refill  . ALPRAZolam (XANAX) 0.5 MG tablet TAKE 1 TABLET EVERY 8 HOURS AS NEEDED  30 tablet  0  . amLODipine (NORVASC) 10 MG tablet TAKE 1 TABLET EVERY DAY  90 tablet  1  . calcium carbonate 1250 MG capsule Take 1,250 mg by mouth 2 (two) times daily with a meal.      . clotrimazole-betamethasone (LOTRISONE) cream APPLY TO AFFECTED AREA TWICE DAILY FOR 10 DAYS  30 g  0  . HYDROcodone-acetaminophen (NORCO/VICODIN) 5-325 MG per tablet TAKE 1 TABLET BY MOUTH EVERY 6 HOURS AS NEEDED FOR PAIN  30 tablet  0  . levothyroxine (SYNTHROID, LEVOTHROID) 137 MCG tablet Take 1 tablet (137 mcg total) by mouth daily.  30 tablet  5  . meloxicam (MOBIC) 7.5 MG tablet Take 2 tablets (15 mg total) by mouth daily.  60 tablet  2  . metoprolol (LOPRESSOR) 50 MG tablet TAKE 1/2 TABLET ONCE DAILY  90 tablet  2  . omeprazole (PRILOSEC) 20 MG capsule Take 20 mg by mouth  daily.      . pravastatin (PRAVACHOL) 20 MG tablet Take 20 mg by mouth daily.      . raloxifene (EVISTA) 60 MG tablet Take 1 tablet (60 mg total) by mouth daily.  30 tablet  5  . triamterene-hydrochlorothiazide (MAXZIDE-25) 37.5-25 MG per tablet TAKE 1 TABLET BY MOUTH DAILY.  30 tablet  2   No current facility-administered medications on file prior to visit.   Allergies  Allergen Reactions  . Fosamax [Alendronate Sodium]    History   Social History  . Marital Status: Married    Spouse Name: N/A    Number of Children: N/A  . Years of Education: N/A   Occupational History  . Not on file.   Social History Main Topics  . Smoking status: Never Smoker   . Smokeless tobacco: Not on file  . Alcohol Use: Not on file  . Drug Use: Not on file  . Sexual Activity: Not on file   Other Topics Concern  . Not on file   Social History Narrative  . No narrative on file     Review of Systems  All other systems reviewed and are negative.       Objective:   Physical Exam  Vitals reviewed. Constitutional: She is oriented to person, place, and time. She  appears well-developed and well-nourished.  Eyes: Conjunctivae are normal. Pupils are equal, round, and reactive to light.  Neck: Neck supple. No JVD present. No thyromegaly present.  Cardiovascular: Normal rate, regular rhythm, normal heart sounds and intact distal pulses.  Exam reveals no gallop and no friction rub.   No murmur heard. Pulmonary/Chest: Effort normal and breath sounds normal. No respiratory distress. She has no wheezes. She has no rales. She exhibits no tenderness.  Abdominal: Soft. Bowel sounds are normal. She exhibits no distension and no mass. There is no tenderness. There is no rebound and no guarding.  Musculoskeletal: She exhibits no edema.  Lymphadenopathy:    She has no cervical adenopathy.  Neurological: She is alert and oriented to person, place, and time. She displays normal reflexes. No cranial nerve  deficit. She exhibits normal muscle tone. Coordination normal.  Skin: Skin is warm. No rash noted. No erythema. No pallor.  Psychiatric: She has a normal mood and affect. Her behavior is normal. Thought content normal.          Assessment & Plan:  1. Need for prophylactic vaccination and inoculation against influenza - Flu Vaccine QUAD 36+ mos IM  2. HLD (hyperlipidemia) Check fasting lipid panel. No LDL is less than 130. - COMPLETE METABOLIC PANEL WITH GFR; Future - Lipid panel; Future  3. Unspecified hypothyroidism Check the patient's TSH and titrate medication to achieve a TSH within therapeutic goal. - TSH; Future  4. HTN (hypertension) Patient's blood pressure is excellent today. Continue her current medications at the present dosages. Check a CMP to evaluate her renal function and her potassium level pressures she has no hypokalemia.Marland Kitchen

## 2013-09-12 IMAGING — CR DG CHEST 2V
2 series · 2 of 2 positions shown · non-contrast
Comparison: 12/23/2010.

CLINICAL DATA: Cough and shortness of breath.

CHEST - 2 VIEW

[w chest pa]
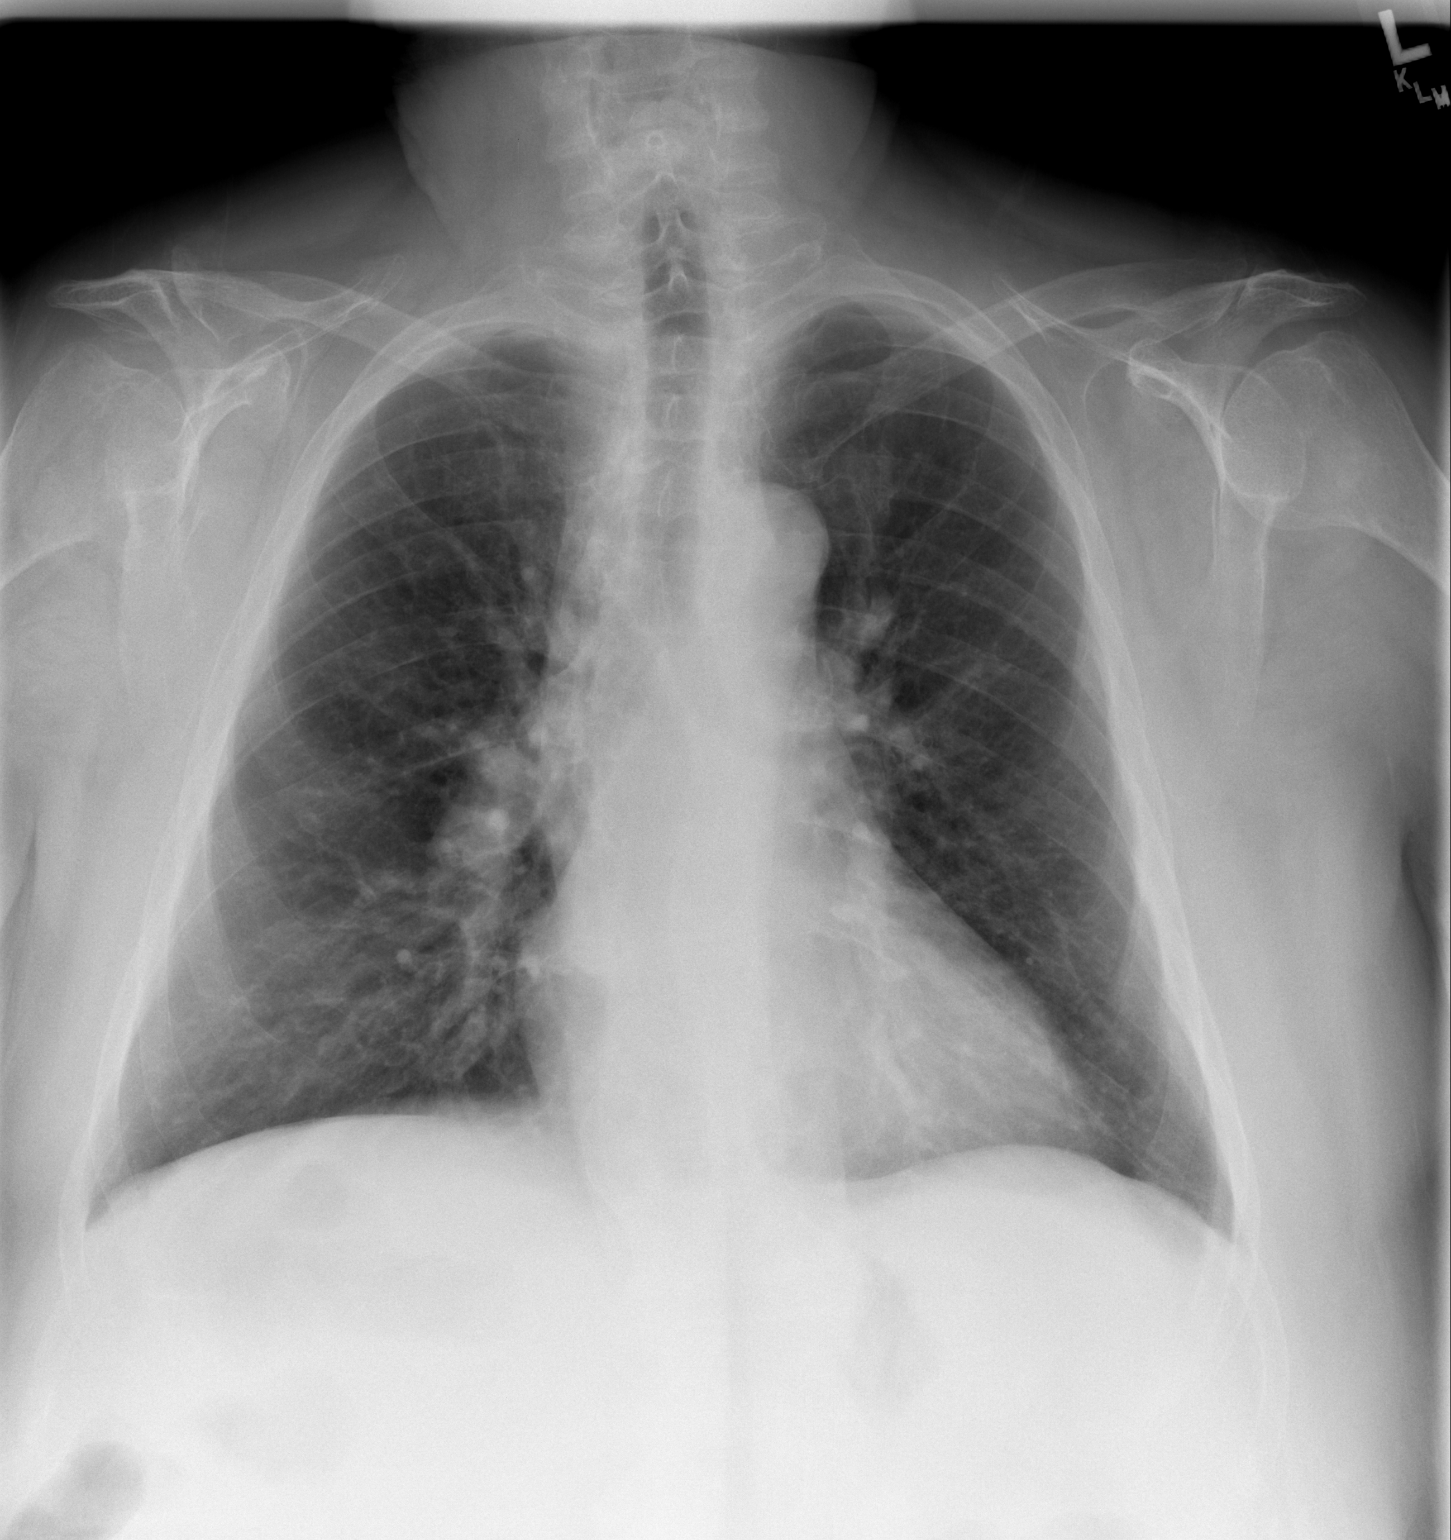

[w chest lat]
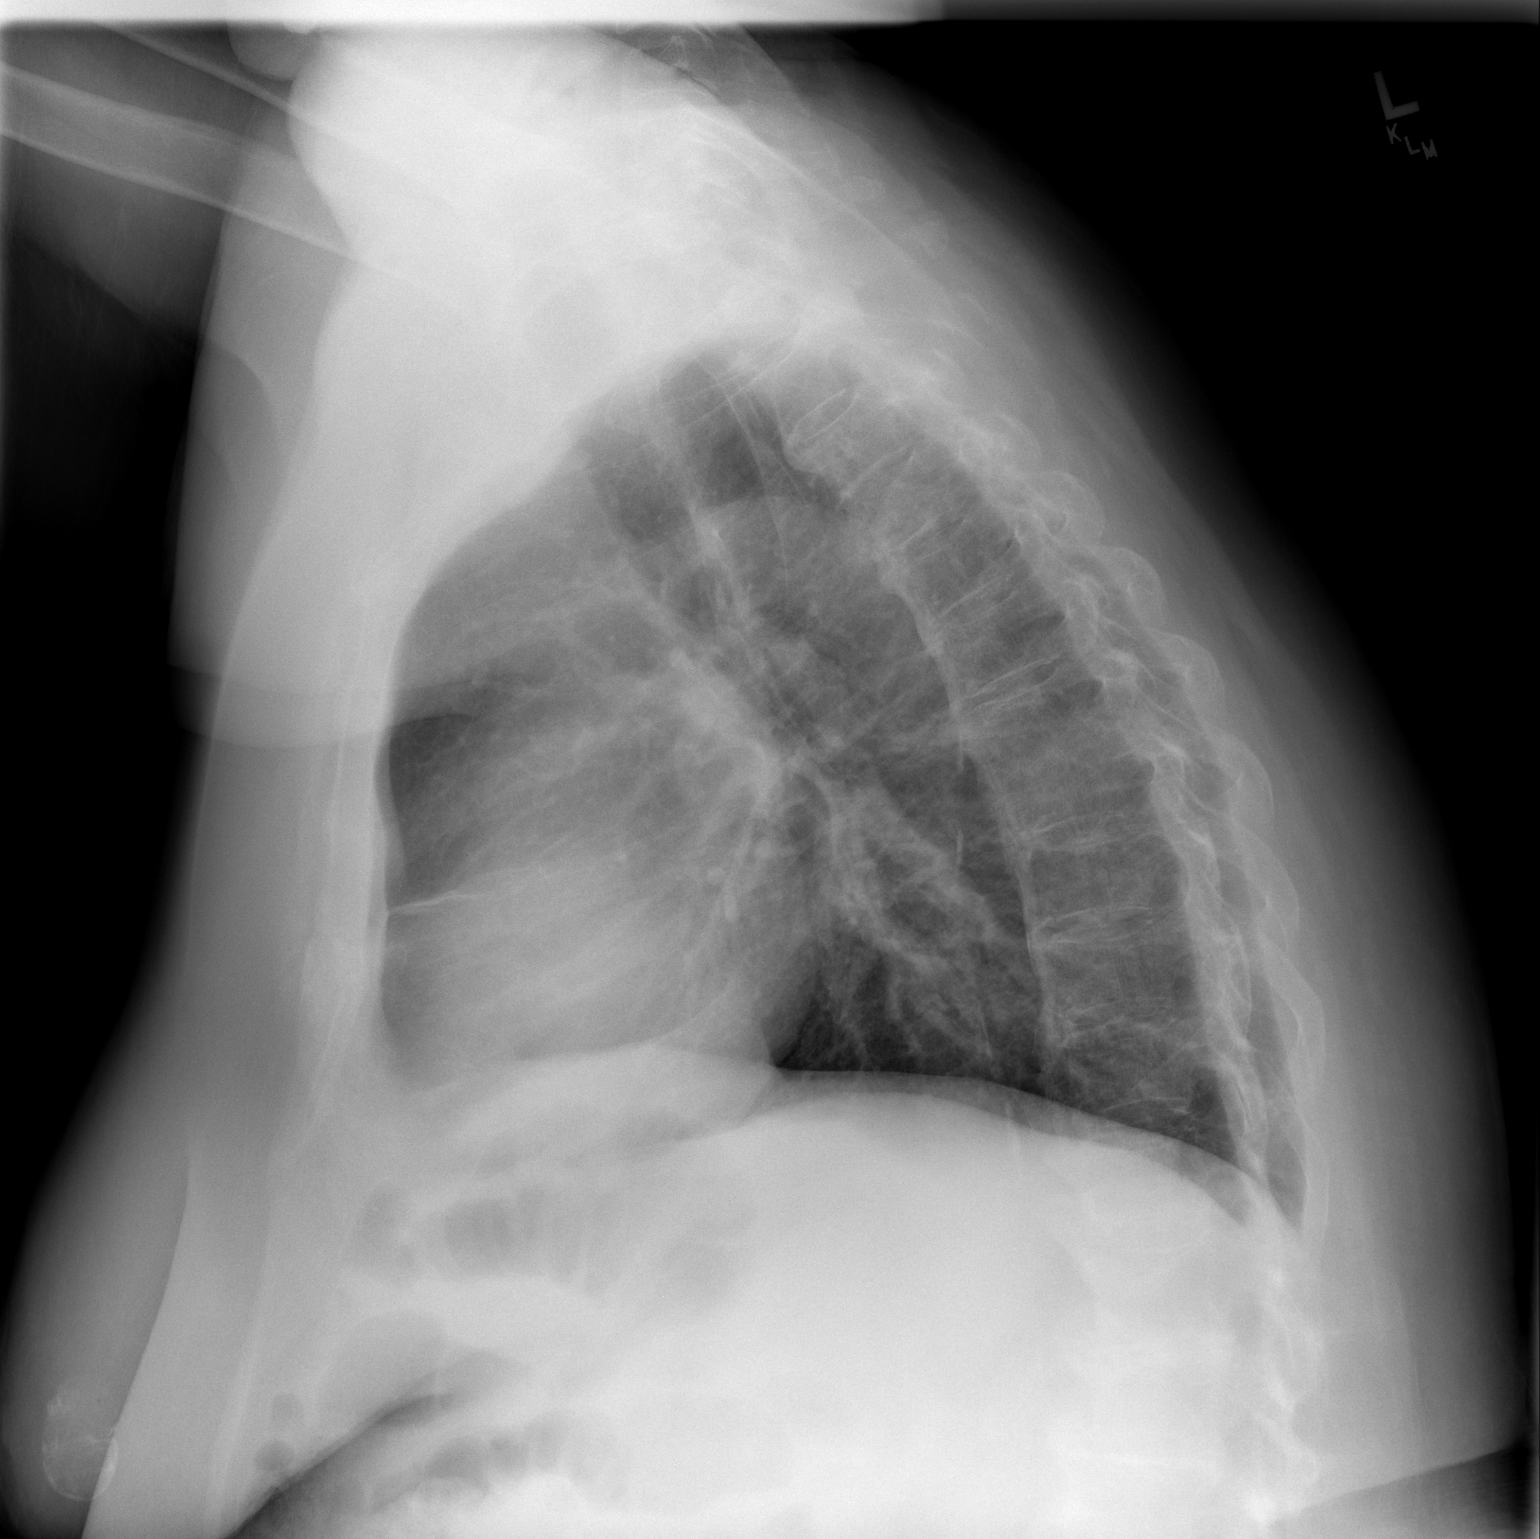

[2 of 2 positions shown; findings below may reference images not displayed]

FINDINGS: Trachea is midline.  Heart size normal.  The appearance
of the lower right paratracheal region appears unchanged from
04/13/2010.  Linear scarring is seen anteriorly on the lateral
view.  Lungs are otherwise clear.  No pleural fluid.  Flowing
anterior osteophytosis is seen in the thoracic spine.
IMPRESSION: No acute findings.

## 2013-09-24 ENCOUNTER — Other Ambulatory Visit: Payer: Self-pay | Admitting: Family Medicine

## 2013-09-28 ENCOUNTER — Other Ambulatory Visit: Payer: Self-pay | Admitting: Family Medicine

## 2013-09-28 NOTE — Telephone Encounter (Signed)
ok 

## 2013-09-28 NOTE — Telephone Encounter (Signed)
?   OK to Refill  

## 2013-09-28 NOTE — Telephone Encounter (Signed)
RX called to pharmacy.  Pt called and informed about new state guidelines and next refill will require her to come to office  for written RX

## 2013-10-04 ENCOUNTER — Ambulatory Visit (INDEPENDENT_AMBULATORY_CARE_PROVIDER_SITE_OTHER): Payer: BC Managed Care – HMO | Admitting: Family Medicine

## 2013-10-04 ENCOUNTER — Encounter: Payer: Self-pay | Admitting: Family Medicine

## 2013-10-04 VITALS — BP 136/88 | HR 100 | Temp 98.3°F | Resp 22 | Wt 166.0 lb

## 2013-10-04 DIAGNOSIS — M5432 Sciatica, left side: Secondary | ICD-10-CM

## 2013-10-04 DIAGNOSIS — M5430 Sciatica, unspecified side: Secondary | ICD-10-CM

## 2013-10-04 DIAGNOSIS — E039 Hypothyroidism, unspecified: Secondary | ICD-10-CM

## 2013-10-04 DIAGNOSIS — E785 Hyperlipidemia, unspecified: Secondary | ICD-10-CM

## 2013-10-04 LAB — COMPLETE METABOLIC PANEL WITH GFR
AST: 12 U/L (ref 0–37)
Albumin: 4 g/dL (ref 3.5–5.2)
Alkaline Phosphatase: 53 U/L (ref 39–117)
BUN: 10 mg/dL (ref 6–23)
Calcium: 9.7 mg/dL (ref 8.4–10.5)
Chloride: 105 mEq/L (ref 96–112)
GFR, Est Non African American: 63 mL/min
Glucose, Bld: 95 mg/dL (ref 70–99)
Potassium: 4.9 mEq/L (ref 3.5–5.3)
Sodium: 144 mEq/L (ref 135–145)
Total Protein: 6.7 g/dL (ref 6.0–8.3)

## 2013-10-04 LAB — TSH: TSH: 2.205 u[IU]/mL (ref 0.350–4.500)

## 2013-10-04 LAB — LIPID PANEL
Cholesterol: 210 mg/dL — ABNORMAL HIGH (ref 0–200)
Total CHOL/HDL Ratio: 4.7 Ratio
VLDL: 29 mg/dL (ref 0–40)

## 2013-10-04 NOTE — Progress Notes (Signed)
Subjective:    Patient ID: [name], female    DOB: Jul 06, 1933, 79 y.o.   MRN: [id number]  HPI Patient first weeks of low back pain and numbness and dysesthesias radiating down her left leg into her lateral knee. It began when she fell approximately 3 weeks ago. She fell forward and caught herself with her arms. She did not land on her hip or her back. However since that time she's had pain and a deep ache in her left sciatic notch.  She also reports numbness and dysesthesias radiating down the left lateral leg to the level just below the knee. This is worse with prolonged standing or walking. She has a history of lumbar laminectomy and fusion in the past. Past Medical History  Diagnosis Date  . Hyperlipidemia   . Hypertension   . Hypothyroid   . Sleep apnea 04/2012    Mild/ AHI 10/CPAP 7cm h2o w/2 L o2  . Osteoporosis    Current Outpatient Prescriptions on File Prior to Visit  Medication Sig Dispense Refill  . ALPRAZolam (XANAX) 0.5 MG tablet TAKE 1 TABLET EVERY 8 HOURS AS NEEDED  30 tablet  0  . amLODipine (NORVASC) 10 MG tablet TAKE 1 TABLET EVERY DAY  90 tablet  1  . calcium carbonate 1250 MG capsule Take 1,250 mg by mouth 2 (two) times daily with a meal.      . clotrimazole-betamethasone (LOTRISONE) cream APPLY TO AFFECTED AREA TWICE DAILY FOR 10 DAYS  30 g  0  . HYDROcodone-acetaminophen (NORCO/VICODIN) 5-325 MG per tablet TAKE 1 TABLET EVERY 6 HOURS AS NEEDED FOR PAIN  30 tablet  0  . levothyroxine (SYNTHROID, LEVOTHROID) 137 MCG tablet TAKE 1 TABLET EVERY DAY  30 tablet  5  . meloxicam (MOBIC) 7.5 MG tablet Take 2 tablets (15 mg total) by mouth daily.  60 tablet  2  . metoprolol (LOPRESSOR) 50 MG tablet TAKE 1/2 TABLET ONCE DAILY  90 tablet  2  . omeprazole (PRILOSEC) 20 MG capsule Take 20 mg by mouth daily.      . raloxifene (EVISTA) 60 MG tablet Take 1 tablet (60 mg total) by mouth daily.  30 tablet  5  . triamterene-hydrochlorothiazide (MAXZIDE-25) 37.5-25 MG per tablet  TAKE 1 TABLET BY MOUTH DAILY.  30 tablet  2   No current facility-administered medications on file prior to visit.   Allergies  Allergen Reactions  . Fosamax [Alendronate Sodium]    History   Social History  . Marital Status: Married    Spouse Name: N/A    Number of Children: N/A  . Years of Education: N/A   Occupational History  . Not on file.   Social History Main Topics  . Smoking status: Never Smoker   . Smokeless tobacco: Not on file  . Alcohol Use: Not on file  . Drug Use: Not on file  . Sexual Activity: Not on file   Other Topics Concern  . Not on file   Social History Narrative  . No narrative on file      Review of Systems  All other systems reviewed and are negative.       Objective:   Physical Exam  Vitals reviewed. Constitutional: She appears well-developed and well-nourished.  Cardiovascular: Normal rate, regular rhythm and normal heart sounds.   Pulmonary/Chest: Effort normal and breath sounds normal. No respiratory distress. She has no wheezes. She has no rales.  Abdominal: Soft. Bowel sounds are normal. She exhibits no distension. There is  no tenderness. There is no rebound.  Musculoskeletal:       Lumbar back: She exhibits decreased range of motion, tenderness and pain. She exhibits no bony tenderness, no swelling, no edema, no deformity, no laceration, no spasm and normal pulse.   chest tenderness to palpation in the left sciatic notch. Chest painless range of motion of the left hip. She has a negative straight-leg raise bilaterally. Muscle strength is 5 over 5 equal and symmetric in both legs. Reflexes are two over four at the patella and Achilles bilaterally.        Assessment & Plan:  1. Left sided sciatica With the patient likely has left-sided sciatica due to a possible herniated nucleus pulposus.  However at the present time the symptoms are mild and not debilitating. She elects not to proceed with MRI. We will pursue a conservative  course of the next 3 weeks. Symptoms slowly improved no further workup is necessary. If symptoms worsen Will proceed with MRI of the spine at that point.  While she is here I'm going to obtain some fasting lab work. She is an other cholesterol medicine for over a month due to bloating and abdominal discomfort.  2. HLD (hyperlipidemia) - COMPLETE METABOLIC PANEL WITH GFR - Lipid panel  3. Unspecified hypothyroidism - TSH

## 2013-10-12 ENCOUNTER — Other Ambulatory Visit: Payer: Self-pay | Admitting: Family Medicine

## 2013-10-12 NOTE — Telephone Encounter (Signed)
Ok to refill 

## 2013-10-12 NOTE — Telephone Encounter (Signed)
ok 

## 2013-10-12 NOTE — Telephone Encounter (Signed)
Meds refilled.

## 2013-10-16 ENCOUNTER — Other Ambulatory Visit: Payer: Self-pay | Admitting: Family Medicine

## 2013-10-17 NOTE — Telephone Encounter (Signed)
Medication refilled per protocol. 

## 2013-10-21 ENCOUNTER — Other Ambulatory Visit: Payer: Self-pay | Admitting: Family Medicine

## 2013-10-21 MED ORDER — OMEPRAZOLE 20 MG PO CPDR: 20.0000 mg | DELAYED_RELEASE_CAPSULE | Freq: Every day | ORAL | Status: DC

## 2013-10-21 NOTE — Telephone Encounter (Signed)
Meds refilled.

## 2013-10-22 ENCOUNTER — Ambulatory Visit (INDEPENDENT_AMBULATORY_CARE_PROVIDER_SITE_OTHER): Payer: BC Managed Care – HMO | Admitting: Family Medicine

## 2013-10-22 ENCOUNTER — Encounter: Payer: Self-pay | Admitting: Family Medicine

## 2013-10-22 VITALS — BP 110/70 | HR 100 | Temp 97.8°F | Resp 20 | Wt 155.0 lb

## 2013-10-22 DIAGNOSIS — M353 Polymyalgia rheumatica: Secondary | ICD-10-CM

## 2013-10-22 LAB — CBC WITH DIFFERENTIAL/PLATELET
Basophils Absolute: 0 10*3/uL (ref 0.0–0.1)
Basophils Relative: 0 % (ref 0–1)
HCT: 41.8 % (ref 36.0–46.0)
MCH: 29 pg (ref 26.0–34.0)
MCHC: 33.5 g/dL (ref 30.0–36.0)
Monocytes Absolute: 0.6 10*3/uL (ref 0.1–1.0)
Neutro Abs: 6.9 10*3/uL (ref 1.7–7.7)
RDW: 14.6 % (ref 11.5–15.5)

## 2013-10-22 LAB — COMPLETE METABOLIC PANEL WITH GFR
ALT: <8 U/L (ref 0–35)
AST: 13 U/L (ref 0–37)
Albumin: 3.9 g/dL (ref 3.5–5.2)
Alkaline Phosphatase: 45 U/L (ref 39–117)
BUN: 11 mg/dL (ref 6–23)
Calcium: 9.5 mg/dL (ref 8.4–10.5)
Creat: 0.82 mg/dL (ref 0.50–1.10)
Potassium: 4.8 mEq/L (ref 3.5–5.3)
Total Bilirubin: 0.8 mg/dL (ref 0.3–1.2)
Total Protein: 6.8 g/dL (ref 6.0–8.3)

## 2013-10-22 LAB — CK: Total CK: 16 U/L (ref 7–177)

## 2013-10-22 LAB — SEDIMENTATION RATE: Sed Rate: 36 mm/hr — ABNORMAL HIGH (ref 0–22)

## 2013-10-22 MED ORDER — PREDNISONE 20 MG PO TABS: 20.0000 mg | ORAL_TABLET | Freq: Every day | ORAL | Status: DC

## 2013-10-22 NOTE — Progress Notes (Signed)
Subjective:    Patient ID: [name], female    DOB: Jan 11, 1933, 79 y.o.   MRN: [id number]  HPI Patient reports one month of diffuse myalgias in her neck and upper shoulders. She also reports myalgias in her lower back into her buttocks into her thigh muscles. She reports weakness and stiffness. She has a hard time even rolling over in bed or standing from a chair without assistance. The pain in her proximal muscles is getting worse. This is been episodic over the last year. We initially treated her as statin myopathy.  I clipped pravastatin and the symptoms improved temporarily but have returned. I've also tried injections in her shoulder for subacromial bursitis with temporary improvement. Past Medical History  Diagnosis Date  . Hyperlipidemia   . Hypertension   . Hypothyroid   . Sleep apnea 04/2012    Mild/ AHI 10/CPAP 7cm h2o w/2 L o2  . Osteoporosis    Current Outpatient Prescriptions on File Prior to Visit  Medication Sig Dispense Refill  . ALPRAZolam (XANAX) 0.5 MG tablet TAKE 1 TABLET EVERY 8 HOURS AS NEEDED  30 tablet  0  . amLODipine (NORVASC) 10 MG tablet TAKE 1 TABLET EVERY DAY  90 tablet  1  . calcium carbonate 1250 MG capsule Take 1,250 mg by mouth 2 (two) times daily with a meal.      . cholecalciferol (VITAMIN D) 1000 UNITS tablet Take 1,000 Units by mouth daily.      . clotrimazole-betamethasone (LOTRISONE) cream APPLY TO AFFECTED AREA TWICE DAILY FOR 10 DAYS  30 g  0  . cyanocobalamin 1000 MCG tablet Take 100 mcg by mouth daily.      Marland Kitchen HYDROcodone-acetaminophen (NORCO/VICODIN) 5-325 MG per tablet TAKE 1 TABLET EVERY 6 HOURS AS NEEDED FOR PAIN  30 tablet  0  . levothyroxine (SYNTHROID, LEVOTHROID) 137 MCG tablet TAKE 1 TABLET EVERY DAY  30 tablet  5  . meloxicam (MOBIC) 7.5 MG tablet TAKE 2 TABLETS (15 MG TOTAL) BY MOUTH DAILY.  60 tablet  2  . metoprolol (LOPRESSOR) 50 MG tablet TAKE 1/2 TABLET ONCE DAILY  90 tablet  2  . omeprazole (PRILOSEC) 20 MG capsule Take 1  capsule (20 mg total) by mouth daily.  30 capsule  11  . raloxifene (EVISTA) 60 MG tablet Take 1 tablet (60 mg total) by mouth daily.  30 tablet  5  . triamterene-hydrochlorothiazide (MAXZIDE-25) 37.5-25 MG per tablet TAKE 1 TABLET BY MOUTH DAILY.  30 tablet  2   No current facility-administered medications on file prior to visit.   Allergies  Allergen Reactions  . Fosamax [Alendronate Sodium]    History   Social History  . Marital Status: Married    Spouse Name: N/A    Number of Children: N/A  . Years of Education: N/A   Occupational History  . Not on file.   Social History Main Topics  . Smoking status: Never Smoker   . Smokeless tobacco: Not on file  . Alcohol Use: Not on file  . Drug Use: Not on file  . Sexual Activity: Not on file   Other Topics Concern  . Not on file   Social History Narrative  . No narrative on file      Review of Systems  All other systems reviewed and are negative.       Objective:   Physical Exam  Vitals reviewed. Cardiovascular: Normal rate and regular rhythm.   Pulmonary/Chest: Effort normal and breath sounds normal. No  respiratory distress. She has no wheezes.  Musculoskeletal:       Right shoulder: She exhibits decreased range of motion, tenderness and bony tenderness.       Left shoulder: She exhibits decreased range of motion, tenderness and bony tenderness.       Cervical back: She exhibits decreased range of motion and tenderness. She exhibits no bony tenderness.       Lumbar back: She exhibits decreased range of motion and tenderness.          Assessment & Plan:  1. PMR (polymyalgia rheumatica) I am concerned the patient may have polymyalgia rheumatica. (His and 20 mg by mouth daily. Check sedimentation rate and a CK level. Recheck here next week. If her symptoms dramatically improved on prednisone began a long protracted taper off of prednisone. If symptoms do not improve we will begin with imaging of the neck and  spine.. - predniSONE (DELTASONE) 20 MG tablet; Take 1 tablet (20 mg total) by mouth daily.  Dispense: 6 tablet; Refill: 0 - Sedimentation Rate - CK - CBC with Differential - COMPLETE METABOLIC PANEL WITH GFR\

## 2013-10-26 ENCOUNTER — Ambulatory Visit (INDEPENDENT_AMBULATORY_CARE_PROVIDER_SITE_OTHER): Payer: BC Managed Care – HMO | Admitting: Family Medicine

## 2013-10-26 ENCOUNTER — Encounter: Payer: Self-pay | Admitting: Family Medicine

## 2013-10-26 VITALS — BP 130/62 | HR 62 | Resp 18 | Wt 160.0 lb

## 2013-10-26 DIAGNOSIS — M353 Polymyalgia rheumatica: Secondary | ICD-10-CM

## 2013-10-26 MED ORDER — PREDNISONE 5 MG PO TABS: 15.0000 mg | ORAL_TABLET | Freq: Every day | ORAL | Status: DC

## 2013-10-26 NOTE — Progress Notes (Signed)
Subjective:    Patient ID: [name], female    DOB: 01-18-1933, 79 y.o.   MRN: [id number]  HPI 10/22/13 Patient reports one month of diffuse myalgias in her neck and upper shoulders. She also reports myalgias in her lower back into her buttocks into her thigh muscles. She reports weakness and stiffness. She has a hard time even rolling over in bed or standing from a chair without assistance. The pain in her proximal muscles is getting worse. This is been episodic over the last year. We initially treated her as statin myopathy.  I clipped pravastatin and the symptoms improved temporarily but have returned. I've also tried injections in her shoulder for subacromial bursitis with temporary improvement.  At that time, my plan was: 1. PMR (polymyalgia rheumatica) I am concerned the patient may have polymyalgia rheumatica. (His and 20 mg by mouth daily. Check sedimentation rate and a CK level. Recheck here next week. If her symptoms dramatically improved on prednisone began a long protracted taper off of prednisone. If symptoms do not improve we will begin with imaging of the neck and spine.. - predniSONE (DELTASONE) 20 MG tablet; Take 1 tablet (20 mg total) by mouth daily.  Dispense: 6 tablet; Refill: 0 - Sedimentation Rate - CK - CBC with Differential - COMPLETE METABOLIC PANEL WITH GFR  She is here today for followup. Her lab results were benign and are listed below. Office Visit on 10/22/2013  Component Date Value Range Status  . Sed Rate 10/22/2013 36* 0 - 22 mm/hr Final  . Total CK 10/22/2013 16  7 - 177 U/L Final  . WBC 10/22/2013 9.1  4.0 - 10.5 K/uL Final  . RBC 10/22/2013 4.83  3.87 - 5.11 MIL/uL Final  . Hemoglobin 10/22/2013 14.0  12.0 - 15.0 g/dL Final  . HCT 40/98/1191 41.8  36.0 - 46.0 % Final  . MCV 10/22/2013 86.5  78.0 - 100.0 fL Final  . MCH 10/22/2013 29.0  26.0 - 34.0 pg Final  . MCHC 10/22/2013 33.5  30.0 - 36.0 g/dL Final  . RDW 47/82/9562 14.6  11.5 - 15.5 % Final   . Platelets 10/22/2013 305  150 - 400 K/uL Final  . Neutrophils Relative % 10/22/2013 76  43 - 77 % Final  . Neutro Abs 10/22/2013 6.9  1.7 - 7.7 K/uL Final  . Lymphocytes Relative 10/22/2013 16  12 - 46 % Final  . Lymphs Abs 10/22/2013 1.5  0.7 - 4.0 K/uL Final  . Monocytes Relative 10/22/2013 7  3 - 12 % Final  . Monocytes Absolute 10/22/2013 0.6  0.1 - 1.0 K/uL Final  . Eosinophils Relative 10/22/2013 1  0 - 5 % Final  . Eosinophils Absolute 10/22/2013 0.1  0.0 - 0.7 K/uL Final  . Basophils Relative 10/22/2013 0  0 - 1 % Final  . Basophils Absolute 10/22/2013 0.0  0.0 - 0.1 K/uL Final  . Smear Review 10/22/2013 Criteria for review not met   Final  . Sodium 10/22/2013 141  135 - 145 mEq/L Final  . Potassium 10/22/2013 4.8  3.5 - 5.3 mEq/L Final  . Chloride 10/22/2013 102  96 - 112 mEq/L Final  . CO2 10/22/2013 27  19 - 32 mEq/L Final  . Glucose, Bld 10/22/2013 94  70 - 99 mg/dL Final  . BUN 13/07/6577 11  6 - 23 mg/dL Final  . Creat 46/96/2952 0.82  0.50 - 1.10 mg/dL Final  . Total Bilirubin 10/22/2013 0.8  0.3 - 1.2 mg/dL Final  .  Alkaline Phosphatase 10/22/2013 45  39 - 117 U/L Final  . AST 10/22/2013 13  0 - 37 U/L Final  . ALT 10/22/2013 <8  0 - 35 U/L Final  . Total Protein 10/22/2013 6.8  6.0 - 8.3 g/dL Final  . Albumin 45/40/9811 3.9  3.5 - 5.2 g/dL Final  . Calcium 91/47/8295 9.5  8.4 - 10.5 mg/dL Final  . GFR, Est African American 10/22/2013 79   Final  . GFR, Est Non African American 10/22/2013 69   Final   Comment:                            The estimated GFR is a calculation valid for adults (>=63 years old)                          that uses the CKD-EPI algorithm to adjust for age and sex. It is                            not to be used for children, pregnant women, hospitalized patients,                             patients on dialysis, or with rapidly changing kidney function.                          According to the NKDEP, eGFR >89 is normal, 60-89 shows mild                           impairment, 30-59 shows moderate impairment, 15-29 shows severe                          impairment and <15 is ESRD.                              Since taking the prednisone 20 mg a day the pain in her shoulders is completely subsided. She is not in any pain this morning except for some mild pain in her lower back that she thinks she pulled a muscle a few weeks ago. She is doing much better he has been a dramatic improvement on the prednisone. Past Medical History  Diagnosis Date  . Hyperlipidemia   . Hypertension   . Hypothyroid   . Sleep apnea 04/2012    Mild/ AHI 10/CPAP 7cm h2o w/2 L o2  . Osteoporosis    Current Outpatient Prescriptions on File Prior to Visit  Medication Sig Dispense Refill  . ALPRAZolam (XANAX) 0.5 MG tablet TAKE 1 TABLET EVERY 8 HOURS AS NEEDED  30 tablet  0  . amLODipine (NORVASC) 10 MG tablet TAKE 1 TABLET EVERY DAY  90 tablet  1  . calcium carbonate 1250 MG capsule Take 1,250 mg by mouth 2 (two) times daily with a meal.      . cholecalciferol (VITAMIN D) 1000 UNITS tablet Take 1,000 Units by mouth daily.      . clotrimazole-betamethasone (LOTRISONE) cream APPLY TO AFFECTED AREA TWICE DAILY FOR 10 DAYS  30 g  0  . cyanocobalamin 1000 MCG tablet Take 100 mcg by mouth daily.      Marland Kitchen HYDROcodone-acetaminophen (NORCO/VICODIN)  5-325 MG per tablet TAKE 1 TABLET EVERY 6 HOURS AS NEEDED FOR PAIN  30 tablet  0  . levothyroxine (SYNTHROID, LEVOTHROID) 137 MCG tablet TAKE 1 TABLET EVERY DAY  30 tablet  5  . meloxicam (MOBIC) 7.5 MG tablet TAKE 2 TABLETS (15 MG TOTAL) BY MOUTH DAILY.  60 tablet  2  . metoprolol (LOPRESSOR) 50 MG tablet TAKE 1/2 TABLET ONCE DAILY  90 tablet  2  . omeprazole (PRILOSEC) 20 MG capsule Take 1 capsule (20 mg total) by mouth daily.  30 capsule  11  . predniSONE (DELTASONE) 20 MG tablet Take 1 tablet (20 mg total) by mouth daily.  6 tablet  0  . raloxifene (EVISTA) 60 MG tablet Take 1 tablet (60 mg total) by mouth daily.  30  tablet  5  . triamterene-hydrochlorothiazide (MAXZIDE-25) 37.5-25 MG per tablet TAKE 1 TABLET BY MOUTH DAILY.  30 tablet  2   No current facility-administered medications on file prior to visit.   Allergies  Allergen Reactions  . Fosamax [Alendronate Sodium]    History   Social History  . Marital Status: Married    Spouse Name: N/A    Number of Children: N/A  . Years of Education: N/A   Occupational History  . Not on file.   Social History Main Topics  . Smoking status: Never Smoker   . Smokeless tobacco: Not on file  . Alcohol Use: Not on file  . Drug Use: Not on file  . Sexual Activity: Not on file   Other Topics Concern  . Not on file   Social History Narrative  . No narrative on file      Review of Systems  All other systems reviewed and are negative.       Objective:   Physical Exam  Vitals reviewed. Cardiovascular: Normal rate and regular rhythm.   Pulmonary/Chest: Effort normal and breath sounds normal. No respiratory distress. She has no wheezes.  Musculoskeletal:       Right shoulder: She exhibits normal range of motion, no tenderness and no bony tenderness.       Left shoulder: She exhibits normal range of motion, no tenderness and no bony tenderness.       Cervical back: She exhibits normal range of motion, no tenderness and no bony tenderness.       Lumbar back: She exhibits decreased range of motion and tenderness.          Assessment & Plan:  1. PMR (polymyalgia rheumatica) I believe the patient has PMR. I weaned her down today to 15 mg of prednisone every day. She is to call me in 2 weeks. If she is still doing well I will decrease it to 12.5 mg every day. She will then call me in 2 weeks. We will repeat that process every 2 weeks until either the patient is completely weaned off prednisone or until we reach a point that I can no longer taper her down due to pain. At that point I will slow the taper and decrease it 1 mg every month. -  predniSONE (DELTASONE) 5 MG tablet; Take 3 tablets (15 mg total) by mouth daily.  Dispense: 90 tablet; Refill: 0

## 2013-11-09 ENCOUNTER — Telehealth: Payer: Self-pay | Admitting: Family Medicine

## 2013-11-09 NOTE — Telephone Encounter (Signed)
Everything is all right.  Please let Dr. Tanya Nones know.

## 2013-11-09 NOTE — Telephone Encounter (Signed)
[facility] aware 

## 2013-11-16 ENCOUNTER — Encounter: Payer: Self-pay | Admitting: Family Medicine

## 2013-11-17 NOTE — Telephone Encounter (Signed)
Wants to know when she is out of this medicine does she need another prescription of is she finished. Please let her know.

## 2013-11-18 NOTE — Telephone Encounter (Signed)
She is referring to prednisone

## 2013-11-18 NOTE — Telephone Encounter (Signed)
Pt is already taking 12.5 mg now - per [facility] decrease to 10mg  qd x 2 weeks and call back before the 2 weeks. Pt verbalizes understanding and will call back

## 2013-11-18 NOTE — Telephone Encounter (Signed)
Decrease prednisone to 12.5 mg poqday and recheck in 2 weeks. 

## 2013-11-28 ENCOUNTER — Other Ambulatory Visit: Payer: Self-pay | Admitting: Family Medicine

## 2013-11-29 NOTE — Telephone Encounter (Signed)
?   OK to Refill  

## 2013-11-29 NOTE — Telephone Encounter (Signed)
ok 

## 2013-11-29 NOTE — Telephone Encounter (Signed)
RX called in .

## 2013-11-30 ENCOUNTER — Other Ambulatory Visit: Payer: Self-pay | Admitting: Family Medicine

## 2013-11-30 ENCOUNTER — Telehealth: Payer: Self-pay | Admitting: Family Medicine

## 2013-11-30 NOTE — Telephone Encounter (Signed)
I want to decrease her dose by 2.5 mg per day for two weeks then recheck.

## 2013-11-30 NOTE — Telephone Encounter (Signed)
Pt is needing a refill on her Prednisone and she wants Dr Tanya Nones to know that she is doing well Call back number is [phone]

## 2013-12-01 NOTE — Telephone Encounter (Signed)
Pt is aware of dosage change and will take 1 1/2 pills = 7.5 mg for 2 weeks and call us back. Refilled RX for prednisone

## 2013-12-01 NOTE — Telephone Encounter (Signed)
[name] is calling today because she called the pharmacy and they have not received her prescription yet and she took the last today and does not have any for tomorrow Call back number is  [phone]

## 2013-12-15 ENCOUNTER — Telehealth: Payer: Self-pay | Admitting: Family Medicine

## 2013-12-15 NOTE — Telephone Encounter (Signed)
Pt is calling to let Dr Tanya Nones know she is doing fine and she will take her last whole pill tomorrow Call back number is [phone]

## 2013-12-16 NOTE — Telephone Encounter (Signed)
.  Patient aware and she is taking 1 pill qd = 5 mg

## 2013-12-16 NOTE — Telephone Encounter (Signed)
I would like to cut her prednisone by 2.5 mg per day for the next two weeks.

## 2013-12-29 ENCOUNTER — Ambulatory Visit (INDEPENDENT_AMBULATORY_CARE_PROVIDER_SITE_OTHER): Payer: BC Managed Care – HMO | Admitting: *Deleted

## 2013-12-29 ENCOUNTER — Ambulatory Visit: Payer: BC Managed Care – HMO

## 2013-12-29 DIAGNOSIS — Z23 Encounter for immunization: Secondary | ICD-10-CM

## 2014-01-03 ENCOUNTER — Telehealth: Payer: Self-pay | Admitting: Family Medicine

## 2014-01-03 ENCOUNTER — Other Ambulatory Visit: Payer: Self-pay | Admitting: Family Medicine

## 2014-01-03 NOTE — Telephone Encounter (Signed)
Patient aware.

## 2014-01-03 NOTE — Telephone Encounter (Signed)
Last 2 weeks now she is on a half pill She is doing great. And she is wanting to know what the next step is  Call back [phone]

## 2014-01-03 NOTE — Telephone Encounter (Signed)
Wean down by 2.5 mg a day.  If she was on 2.5 mg a day she can stop.

## 2014-01-03 NOTE — Telephone Encounter (Signed)
She can use mobic as needed.

## 2014-01-03 NOTE — Telephone Encounter (Signed)
Pt wants to know if you want her to go back on her Mobic? She is now finished with the prednisone as she was only taking 2.5mg  qd.

## 2014-01-09 ENCOUNTER — Other Ambulatory Visit: Payer: Self-pay | Admitting: Family Medicine

## 2014-01-10 NOTE — Telephone Encounter (Signed)
ok 

## 2014-01-10 NOTE — Telephone Encounter (Signed)
Ok to refill 

## 2014-02-10 ENCOUNTER — Encounter: Payer: Self-pay | Admitting: Family Medicine

## 2014-02-10 ENCOUNTER — Ambulatory Visit (INDEPENDENT_AMBULATORY_CARE_PROVIDER_SITE_OTHER): Payer: Medicare HMO | Admitting: Family Medicine

## 2014-02-10 VITALS — BP 120/68 | HR 76 | Temp 97.4°F | Resp 16 | Ht <= 58 in | Wt 166.0 lb

## 2014-02-10 DIAGNOSIS — M129 Arthropathy, unspecified: Secondary | ICD-10-CM

## 2014-02-10 DIAGNOSIS — L659 Nonscarring hair loss, unspecified: Secondary | ICD-10-CM

## 2014-02-10 DIAGNOSIS — M1990 Unspecified osteoarthritis, unspecified site: Secondary | ICD-10-CM

## 2014-02-10 NOTE — Progress Notes (Signed)
Subjective:    Patient ID: [name], female    DOB: May 16, 1933, 80 y.o.   MRN: [id number]  HPI Patient reports pain and stiffness in both hands. It is worse in the morning and improves as the day wears on. The pain is primarily in the PIP joints and DIP joints equally on both hands. It seems to have worsened with change in weather. She denies any pain in her shoulders or in her hips. She does have a history of PMR and she was concerned that maybe that causing it. She also reports that her hair has been thinning out more rapidly. It seems to be thinning in an androgenic pattern primarily in the center of the scalp rather than on the sides. Otherwise she is doing well. Past Medical History  Diagnosis Date  . Hyperlipidemia   . Hypertension   . Hypothyroid   . Sleep apnea 04/2012    Mild/ AHI 10/CPAP 7cm h2o w/2 L o2  . Osteoporosis    Current Outpatient Prescriptions on File Prior to Visit  Medication Sig Dispense Refill  . ALPRAZolam (XANAX) 0.5 MG tablet TAKE 1 TABLET BY MOUTH EVERY 8 HOURS AS NEEDED  30 tablet  0  . amLODipine (NORVASC) 10 MG tablet TAKE 1 TABLET EVERY DAY  90 tablet  1  . calcium carbonate 1250 MG capsule Take 1,250 mg by mouth 2 (two) times daily with a meal.      . cholecalciferol (VITAMIN D) 1000 UNITS tablet Take 1,000 Units by mouth daily.      . clotrimazole-betamethasone (LOTRISONE) cream APPLY TO AFFECTED AREA TWICE DAILY FOR 10 DAYS  30 g  0  . cyanocobalamin 1000 MCG tablet Take 100 mcg by mouth daily.      Marland Kitchen HYDROcodone-acetaminophen (NORCO/VICODIN) 5-325 MG per tablet TAKE 1 TABLET EVERY 6 HOURS AS NEEDED FOR PAIN  30 tablet  0  . levothyroxine (SYNTHROID, LEVOTHROID) 137 MCG tablet TAKE 1 TABLET EVERY DAY  30 tablet  5  . meloxicam (MOBIC) 7.5 MG tablet TAKE 2 TABLETS (15 MG TOTAL) BY MOUTH DAILY.  60 tablet  2  . metoprolol (LOPRESSOR) 50 MG tablet TAKE 1/2 TABLET ONCE DAILY  90 tablet  2  . omeprazole (PRILOSEC) 20 MG capsule Take 1 capsule (20 mg  total) by mouth daily.  30 capsule  11  . raloxifene (EVISTA) 60 MG tablet Take 1 tablet (60 mg total) by mouth daily.  30 tablet  5  . triamterene-hydrochlorothiazide (MAXZIDE-25) 37.5-25 MG per tablet TAKE 1 TABLET BY MOUTH DAILY.  30 tablet  2   No current facility-administered medications on file prior to visit.   Allergies  Allergen Reactions  . Fosamax [Alendronate Sodium]    History   Social History  . Marital Status: Married    Spouse Name: N/A    Number of Children: N/A  . Years of Education: N/A   Occupational History  . Not on file.   Social History Main Topics  . Smoking status: Never Smoker   . Smokeless tobacco: Not on file  . Alcohol Use: Not on file  . Drug Use: Not on file  . Sexual Activity: Not on file   Other Topics Concern  . Not on file   Social History Narrative  . No narrative on file      Review of Systems  All other systems reviewed and are negative.       Objective:   Physical Exam  Vitals reviewed. Neck: No  thyromegaly present.  Cardiovascular: Normal rate, regular rhythm and normal heart sounds.   Pulmonary/Chest: Effort normal and breath sounds normal. No respiratory distress. She has no wheezes. She has no rales. She exhibits no tenderness.  Musculoskeletal:       Right hand: Normal.       Left hand: Normal.    Patient does have extremely thin hair in the center of her scalp in an androgenic pattern      Assessment & Plan:  1. 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 Check a BMP, CBC, TSH. If these labs are normal I would suggest Rogaine cream for women over-the-counter to try to combat androgenic alopecia. - CBC with Differential - Basic Metabolic Panel - TSH  2. Arthritis I suspect this is osteoarthritis in the hands. I reassured the patient that I do not believe that this is a sign of her PMR returning. I recommended meloxicam 7.5 mg by mouth daily as needed for pain and stiffness in her PIP and DIP  joints.

## 2014-02-11 LAB — CBC WITH DIFFERENTIAL/PLATELET
BASOS PCT: 0 % (ref 0–1)
Basophils Absolute: 0 10*3/uL (ref 0.0–0.1)
EOS ABS: 0.2 10*3/uL (ref 0.0–0.7)
Eosinophils Relative: 3 % (ref 0–5)
HCT: 43 % (ref 36.0–46.0)
HEMOGLOBIN: 14.2 g/dL (ref 12.0–15.0)
LYMPHS ABS: 1.8 10*3/uL (ref 0.7–4.0)
Lymphocytes Relative: 25 % (ref 12–46)
MCH: 28.9 pg (ref 26.0–34.0)
MCHC: 33 g/dL (ref 30.0–36.0)
MCV: 87.4 fL (ref 78.0–100.0)
Monocytes Absolute: 0.4 10*3/uL (ref 0.1–1.0)
Monocytes Relative: 6 % (ref 3–12)
NEUTROS PCT: 66 % (ref 43–77)
Neutro Abs: 4.8 10*3/uL (ref 1.7–7.7)
Platelets: 288 10*3/uL (ref 150–400)
RBC: 4.92 MIL/uL (ref 3.87–5.11)
RDW: 12.9 % (ref 11.5–15.5)
WBC: 7.3 10*3/uL (ref 4.0–10.5)

## 2014-02-11 LAB — BASIC METABOLIC PANEL
BUN: 12 mg/dL (ref 6–23)
CALCIUM: 8.7 mg/dL (ref 8.4–10.5)
CO2: 25 meq/L (ref 19–32)
Chloride: 106 mEq/L (ref 96–112)
Creat: 0.78 mg/dL (ref 0.50–1.10)
GLUCOSE: 79 mg/dL (ref 70–99)
Potassium: 4.2 mEq/L (ref 3.5–5.3)
SODIUM: 141 meq/L (ref 135–145)

## 2014-02-11 LAB — TSH: TSH: 1.771 u[IU]/mL (ref 0.350–4.500)

## 2014-02-20 ENCOUNTER — Other Ambulatory Visit: Payer: Self-pay | Admitting: Family Medicine

## 2014-02-21 NOTE — Telephone Encounter (Signed)
ok 

## 2014-02-21 NOTE — Telephone Encounter (Signed)
?   OK to Refill  

## 2014-03-07 ENCOUNTER — Other Ambulatory Visit: Payer: Self-pay | Admitting: Family Medicine

## 2014-03-14 ENCOUNTER — Ambulatory Visit: Payer: Medicare HMO | Admitting: Family Medicine

## 2014-03-15 ENCOUNTER — Encounter: Payer: Self-pay | Admitting: Family Medicine

## 2014-03-15 ENCOUNTER — Ambulatory Visit (INDEPENDENT_AMBULATORY_CARE_PROVIDER_SITE_OTHER): Payer: Medicare HMO | Admitting: Family Medicine

## 2014-03-15 VITALS — BP 120/68 | HR 78 | Temp 98.1°F | Resp 18 | Ht <= 58 in | Wt 162.0 lb

## 2014-03-15 DIAGNOSIS — M353 Polymyalgia rheumatica: Secondary | ICD-10-CM

## 2014-03-15 MED ORDER — PREDNISONE 20 MG PO TABS: 20.0000 mg | ORAL_TABLET | Freq: Every day | ORAL | Status: DC

## 2014-03-15 NOTE — Progress Notes (Signed)
Subjective:    Patient ID: [name], female    DOB: July 10, 1933, 80 y.o.   MRN: [id number]  HPI Patient has a history of polymyalgia rheumatica. We successfully wean the patient off prednisone in the past. Recently he has developed severe myalgias in both shoulders, and both hips, and in the posterior aspects of both thighs. She reports constant aching pain. This is debilitating. It is causing her to have a difficult time even to move. She denies any pain in her knees or in her wrist or her elbows. She denies any pain in her ankles. There is no erythema or lump in her shoulder or hip joints. She has mild pain with range of motion the right shoulder. There is no pain with range of motion in the left shoulder. She does complain of pain in her neck. All this is more of a stiffness and muscle ache rather than an arthralgia. She denies any fevers or chills or headaches. Past Medical History  Diagnosis Date  . Hyperlipidemia   . Hypertension   . Hypothyroid   . Sleep apnea 04/2012    Mild/ AHI 10/CPAP 7cm h2o w/2 L o2  . Osteoporosis    Current Outpatient Prescriptions on File Prior to Visit  Medication Sig Dispense Refill  . ALPRAZolam (XANAX) 0.5 MG tablet TAKE 1 TABLET BY MOUTH EVERY 8 HOURS AS NEEDED  30 tablet  0  . amLODipine (NORVASC) 10 MG tablet TAKE 1 TABLET EVERY DAY  90 tablet  1  . calcium carbonate 1250 MG capsule Take 1,250 mg by mouth 2 (two) times daily with a meal.      . cholecalciferol (VITAMIN D) 1000 UNITS tablet Take 1,000 Units by mouth daily.      . clotrimazole-betamethasone (LOTRISONE) cream APPLY TO AFFECTED AREA TWICE DAILY FOR 10 DAYS  30 g  0  . cyanocobalamin 1000 MCG tablet Take 100 mcg by mouth daily.      Marland Kitchen HYDROcodone-acetaminophen (NORCO/VICODIN) 5-325 MG per tablet TAKE 1 TABLET EVERY 6 HOURS AS NEEDED FOR PAIN  30 tablet  0  . levothyroxine (SYNTHROID, LEVOTHROID) 137 MCG tablet TAKE 1 TABLET EVERY DAY  30 tablet  11  . meloxicam (MOBIC) 7.5 MG tablet  TAKE 2 TABLETS (15 MG TOTAL) BY MOUTH DAILY.  60 tablet  2  . metoprolol (LOPRESSOR) 50 MG tablet TAKE 1/2 TABLET ONCE DAILY  90 tablet  2  . omeprazole (PRILOSEC) 20 MG capsule Take 1 capsule (20 mg total) by mouth daily.  30 capsule  11  . raloxifene (EVISTA) 60 MG tablet Take 1 tablet (60 mg total) by mouth daily.  30 tablet  5  . traMADol (ULTRAM) 50 MG tablet TAKE 1 TABLET (50 MG TOTAL) BY MOUTH EVERY 6 (SIX) HOURS AS NEEDED FOR PAIN.  30 tablet  2  . triamterene-hydrochlorothiazide (MAXZIDE-25) 37.5-25 MG per tablet TAKE 1 TABLET BY MOUTH DAILY.  30 tablet  2   No current facility-administered medications on file prior to visit.   Allergies  Allergen Reactions  . Fosamax [Alendronate Sodium]    History   Social History  . Marital Status: Married    Spouse Name: N/A    Number of Children: N/A  . Years of Education: N/A   Occupational History  . Not on file.   Social History Main Topics  . Smoking status: Never Smoker   . Smokeless tobacco: Not on file  . Alcohol Use: Not on file  . Drug Use: Not on  file  . Sexual Activity: Not on file   Other Topics Concern  . Not on file   Social History Narrative  . No narrative on file      Review of Systems  All other systems reviewed and are negative.       Objective:   Physical Exam  Vitals reviewed. Cardiovascular: Normal rate and regular rhythm.   Pulmonary/Chest: Effort normal and breath sounds normal.  Musculoskeletal:       Right shoulder: She exhibits decreased range of motion and pain. She exhibits no tenderness, no bony tenderness and no spasm.       Left shoulder: She exhibits pain. She exhibits normal range of motion, no tenderness, no spasm and normal strength.       Right hip: She exhibits normal range of motion, normal strength and no tenderness.       Left hip: She exhibits normal range of motion, normal strength and no tenderness.          Assessment & Plan:  1. PMR (polymyalgia  rheumatica) Begin prednisone 20 mg by mouth daily recheck in one week. I will try to wean the patient down as tolerated off the prednisone.

## 2014-03-18 ENCOUNTER — Telehealth: Payer: Self-pay | Admitting: Family Medicine

## 2014-03-18 NOTE — Telephone Encounter (Signed)
Pt called and states that she is feeling much better, leg pain is gone, not completely well but doing much better then the other day.

## 2014-03-18 NOTE — Telephone Encounter (Signed)
Great, recheck next week as planned.

## 2014-03-25 ENCOUNTER — Telehealth: Payer: Self-pay | Admitting: Family Medicine

## 2014-03-25 MED ORDER — ALPRAZOLAM 0.5 MG PO TABS: ORAL_TABLET | ORAL | Status: DC

## 2014-03-25 MED ORDER — PREDNISONE 10 MG PO TABS: ORAL_TABLET | ORAL | Status: DC

## 2014-03-25 NOTE — Telephone Encounter (Signed)
Pt aware and meds refilled (Xanax okd by Dr. Dennard Schaumann

## 2014-03-25 NOTE — Telephone Encounter (Signed)
Message copied by Alyson Locket on Fri Mar 25, 2014 11:05 AM ------      Message from: Lenore Manner      Created: Fri Mar 25, 2014  9:10 AM      Regarding: f/u of medication      Contact: [phone]       Pt is calling because she is doing all right with the prednisone 20mg  4 more pills left. What does she need to do now? ------

## 2014-03-25 NOTE — Telephone Encounter (Signed)
Wean down to 15 mg a day and recheck in 2 weeks.

## 2014-04-08 ENCOUNTER — Telehealth: Payer: Self-pay | Admitting: Family Medicine

## 2014-04-08 MED ORDER — PREDNISONE 5 MG PO TABS: ORAL_TABLET | ORAL | Status: DC

## 2014-04-08 NOTE — Telephone Encounter (Signed)
Pt aware of med change and will call back in 2 weeks

## 2014-04-08 NOTE — Telephone Encounter (Signed)
Decrease prednisone to 12.5 mg poqday and recheck in 2 weeks.

## 2014-04-08 NOTE — Telephone Encounter (Signed)
Pt is wanting to know what you want her to do about her Prednisone??? She is on 15mg  qd.

## 2014-04-12 ENCOUNTER — Other Ambulatory Visit: Payer: Self-pay | Admitting: Family Medicine

## 2014-04-13 NOTE — Telephone Encounter (Signed)
Medication refilled per protocol. 

## 2014-04-18 ENCOUNTER — Other Ambulatory Visit: Payer: Self-pay | Admitting: Family Medicine

## 2014-04-28 ENCOUNTER — Telehealth: Payer: Self-pay | Admitting: Family Medicine

## 2014-04-28 NOTE — Telephone Encounter (Signed)
Decrease prednisone to 10 mg poqday.

## 2014-04-28 NOTE — Telephone Encounter (Signed)
Message copied by Alyson Locket on Thu Apr 28, 2014 11:53 AM ------      Message from: Lenore Manner      Created: Thu Apr 28, 2014 11:29 AM      Regarding: Prednisone f/u call      Contact: [phone]       She is doing fine with prednisone and she is wanting to know what she needs to do now with taking the medication        ------

## 2014-04-28 NOTE — Telephone Encounter (Signed)
Pt aware and will call us back in 2 weeks

## 2014-05-08 ENCOUNTER — Other Ambulatory Visit: Payer: Self-pay | Admitting: Family Medicine

## 2014-05-09 ENCOUNTER — Other Ambulatory Visit: Payer: Self-pay

## 2014-05-09 DIAGNOSIS — Z1231 Encounter for screening mammogram for malignant neoplasm of breast: Secondary | ICD-10-CM

## 2014-05-12 ENCOUNTER — Telehealth: Payer: Self-pay | Admitting: Family Medicine

## 2014-05-12 ENCOUNTER — Other Ambulatory Visit: Payer: Self-pay | Admitting: Family Medicine

## 2014-05-12 MED ORDER — PREDNISONE 1 MG PO TABS: ORAL_TABLET | ORAL | Status: DC

## 2014-05-12 NOTE — Telephone Encounter (Signed)
What dose of prednisone should she be taking after tomorrow?  Per Dr. Dennard Schaumann pt needs to take 9mg  po qd x 2 weeks and call us back.   Pt aware and med sent to pharm. Pt instructed to take one 5 mg and four 1mg  tabs po qd. Pt verbalizes understanding.

## 2014-06-02 ENCOUNTER — Telehealth: Payer: Self-pay | Admitting: Family Medicine

## 2014-06-02 NOTE — Telephone Encounter (Signed)
Message copied by Alyson Locket on Thu Jun 02, 2014 12:03 PM ------      Message from: Lenore Manner      Created: Thu Jun 02, 2014 10:16 AM      Contact: [phone]       Pt is needing to know about what dr pickard is wanting her to do about her medicine. States she is on prednisone  ------

## 2014-06-02 NOTE — Telephone Encounter (Signed)
Pt is currently taking 9 mg qd. Now per [facility] she needs to decrease her dose by 1 mg every 2 weeks- She should be taking 8mg  qd - pt aware.

## 2014-06-10 ENCOUNTER — Encounter (INDEPENDENT_AMBULATORY_CARE_PROVIDER_SITE_OTHER): Payer: Self-pay

## 2014-06-10 ENCOUNTER — Ambulatory Visit
Admission: RE | Admit: 2014-06-10 | Discharge: 2014-06-10 | Disposition: A | Discharge status: Home / Self Care | Payer: Commercial Managed Care - HMO | Source: Ambulatory Visit

## 2014-06-10 DIAGNOSIS — Z1231 Encounter for screening mammogram for malignant neoplasm of breast: Secondary | ICD-10-CM

## 2014-06-27 ENCOUNTER — Other Ambulatory Visit: Payer: Self-pay | Admitting: Family Medicine

## 2014-07-08 ENCOUNTER — Telehealth: Payer: Self-pay | Admitting: *Deleted

## 2014-07-08 NOTE — Telephone Encounter (Signed)
Pt has had arthritis in hips x2days wants to know if you can call something in for her.  [facility]

## 2014-07-08 NOTE — Telephone Encounter (Signed)
She is already on tramadol, mobic and prednisone?  Is she taking mobic?  If not start 15 mg poqday.

## 2014-07-09 NOTE — Telephone Encounter (Signed)
Pt was not taking Tramadol or Mobic - she did have the Mobic 7.5 mg so I told her to take 2 of those and she agreed and she will look to see if she has Tramadol and I told her she could take 1 q 6 hours as needed. She verbalized understanding and will call back Monday if no better.

## 2014-07-15 ENCOUNTER — Telehealth: Payer: Self-pay | Admitting: Family Medicine

## 2014-07-15 MED ORDER — TRAMADOL HCL 50 MG PO TABS: ORAL_TABLET | ORAL | Status: DC

## 2014-07-15 NOTE — Telephone Encounter (Signed)
Tramadol 50 mg poq 6 hrs prn pain.  NTBS if no better.

## 2014-07-15 NOTE — Telephone Encounter (Signed)
Pt aware and med called to pharm 

## 2014-07-15 NOTE — Telephone Encounter (Signed)
Pt is still in a lot of pain and the mobic 7.5mg  2 tab qd is not helping. She checked and she did not have any Tramadol at home so she has only been taking the Mobic without relief. Can we call her in something?

## 2014-08-05 ENCOUNTER — Telehealth: Payer: Self-pay | Admitting: *Deleted

## 2014-08-05 NOTE — Telephone Encounter (Signed)
Pt would like something called in for her kidneys, states she is having frequent urination.   Pharmacy [facility]

## 2014-08-05 NOTE — Telephone Encounter (Signed)
Does she feel she is having a uti, we need to get more information.  SHe may need to be seen.

## 2014-08-05 NOTE — Telephone Encounter (Signed)
Call placed to patient and was advised that she is feeling better now.   States that she does not feel like she has UTI.

## 2014-08-11 ENCOUNTER — Telehealth: Payer: Self-pay | Admitting: Family Medicine

## 2014-08-11 ENCOUNTER — Ambulatory Visit: Payer: Medicare HMO | Admitting: Family Medicine

## 2014-08-11 ENCOUNTER — Encounter: Payer: Self-pay | Admitting: Family Medicine

## 2014-08-11 ENCOUNTER — Ambulatory Visit (INDEPENDENT_AMBULATORY_CARE_PROVIDER_SITE_OTHER): Payer: Medicare HMO | Admitting: Family Medicine

## 2014-08-11 VITALS — BP 124/68 | HR 72 | Temp 97.9°F | Resp 16 | Ht <= 58 in | Wt 182.0 lb

## 2014-08-11 DIAGNOSIS — R309 Painful micturition, unspecified: Secondary | ICD-10-CM

## 2014-08-11 DIAGNOSIS — R300 Dysuria: Secondary | ICD-10-CM

## 2014-08-11 DIAGNOSIS — M353 Polymyalgia rheumatica: Secondary | ICD-10-CM

## 2014-08-11 LAB — CBC WITH DIFFERENTIAL/PLATELET
BASOS PCT: 0 % (ref 0–1)
Basophils Absolute: 0 10*3/uL (ref 0.0–0.1)
EOS PCT: 2 % (ref 0–5)
Eosinophils Absolute: 0.2 10*3/uL (ref 0.0–0.7)
HEMATOCRIT: 47.8 % — AB (ref 36.0–46.0)
Hemoglobin: 16.4 g/dL — ABNORMAL HIGH (ref 12.0–15.0)
Lymphocytes Relative: 21 % (ref 12–46)
Lymphs Abs: 1.8 10*3/uL (ref 0.7–4.0)
MCH: 30.3 pg (ref 26.0–34.0)
MCHC: 34.3 g/dL (ref 30.0–36.0)
MCV: 88.4 fL (ref 78.0–100.0)
MONO ABS: 0.7 10*3/uL (ref 0.1–1.0)
MONOS PCT: 8 % (ref 3–12)
NEUTROS PCT: 69 % (ref 43–77)
Neutro Abs: 5.8 10*3/uL (ref 1.7–7.7)
Platelets: 287 10*3/uL (ref 150–400)
RBC: 5.41 MIL/uL — AB (ref 3.87–5.11)
RDW: 13.4 % (ref 11.5–15.5)
WBC: 8.4 10*3/uL (ref 4.0–10.5)

## 2014-08-11 LAB — COMPLETE METABOLIC PANEL WITH GFR
ALT: 10 U/L (ref 0–35)
AST: 15 U/L (ref 0–37)
Albumin: 4.1 g/dL (ref 3.5–5.2)
Alkaline Phosphatase: 39 U/L (ref 39–117)
BILIRUBIN TOTAL: 0.9 mg/dL (ref 0.2–1.2)
BUN: 25 mg/dL — AB (ref 6–23)
CO2: 30 meq/L (ref 19–32)
Calcium: 9.9 mg/dL (ref 8.4–10.5)
Chloride: 101 mEq/L (ref 96–112)
Creat: 1.02 mg/dL (ref 0.50–1.10)
GFR, EST AFRICAN AMERICAN: 60 mL/min
GFR, EST NON AFRICAN AMERICAN: 52 mL/min — AB
GLUCOSE: 96 mg/dL (ref 70–99)
Potassium: 4.1 mEq/L (ref 3.5–5.3)
SODIUM: 141 meq/L (ref 135–145)
TOTAL PROTEIN: 6.7 g/dL (ref 6.0–8.3)

## 2014-08-11 LAB — URINALYSIS, MICROSCOPIC ONLY
Casts: NONE SEEN
Crystals: NONE SEEN
RBC / HPF: NONE SEEN RBC/hpf (ref <3)

## 2014-08-11 LAB — URINALYSIS, ROUTINE W REFLEX MICROSCOPIC
Bilirubin Urine: NEGATIVE
Glucose, UA: NEGATIVE mg/dL
HGB URINE DIPSTICK: NEGATIVE
KETONES UR: NEGATIVE mg/dL
NITRITE: NEGATIVE
Protein, ur: NEGATIVE mg/dL
Specific Gravity, Urine: 1.02 (ref 1.005–1.030)
Urobilinogen, UA: 0.2 mg/dL (ref 0.0–1.0)
pH: 5.5 (ref 5.0–8.0)

## 2014-08-11 MED ORDER — ALPRAZOLAM 0.25 MG PO TABS: 0.2500 mg | ORAL_TABLET | Freq: Every day | ORAL | Status: DC

## 2014-08-11 MED ORDER — CIPROFLOXACIN HCL 500 MG PO TABS: 500.0000 mg | ORAL_TABLET | Freq: Two times a day (BID) | ORAL | Status: DC

## 2014-08-11 NOTE — Telephone Encounter (Signed)
Patient was here today to see dr pickard and forgot to ask for somehting to help her sleep  [phone]

## 2014-08-11 NOTE — Telephone Encounter (Signed)
Pt aware and med called to pharm 

## 2014-08-11 NOTE — Addendum Note (Signed)
Addended by: Sharmon Revere on: 08/11/2014 08:41 AM   Modules accepted: Orders

## 2014-08-11 NOTE — Telephone Encounter (Signed)
Try xanax 0.25 mg poqhs prn 30.

## 2014-08-11 NOTE — Progress Notes (Signed)
Subjective:    Patient ID: [name], female    DOB: 1933-08-09, 80 y.o.   MRN: [id number]  HPI Patient has a history of polymyalgia rheumatica. She has been able to wean herself down to 4 mg of prednisone daily however last week after weaning herself down off the milligrams she developed diffuse pain in both hips and in her lower back. I was able to successfully control her pain by adding meloxicam 7.5 mg daily. She is no longer in any pain. She does complain of some mild dysuria and frequency and urgency over the last 3 days. Urinalysis is significant for bacteria as well as leukocyte esterase. Past Medical History  Diagnosis Date  . Hyperlipidemia   . Hypertension   . Hypothyroid   . Sleep apnea 04/2012    Mild/ AHI 10/CPAP 7cm h2o w/2 L o2  . Osteoporosis    Current Outpatient Prescriptions on File Prior to Visit  Medication Sig Dispense Refill  . ALPRAZolam (XANAX) 0.5 MG tablet TAKE 1 TABLET BY MOUTH EVERY 8 HOURS AS NEEDED  30 tablet  2  . amLODipine (NORVASC) 10 MG tablet TAKE 1 TABLET EVERY DAY  90 tablet  3  . calcium carbonate 1250 MG capsule Take 1,250 mg by mouth 2 (two) times daily with a meal.      . cholecalciferol (VITAMIN D) 1000 UNITS tablet Take 1,000 Units by mouth daily.      . clotrimazole-betamethasone (LOTRISONE) cream APPLY TO AFFECTED AREA TWICE DAILY FOR 10 DAYS  30 g  0  . cyanocobalamin 1000 MCG tablet Take 100 mcg by mouth daily.      Marland Kitchen HYDROcodone-acetaminophen (NORCO/VICODIN) 5-325 MG per tablet TAKE 1 TABLET EVERY 6 HOURS AS NEEDED FOR PAIN  30 tablet  0  . meloxicam (MOBIC) 7.5 MG tablet TAKE 2 TABLETS (15 MG TOTAL) BY MOUTH DAILY.  60 tablet  2  . metoprolol (LOPRESSOR) 50 MG tablet TAKE 1/2 TABLET ONCE DAILY  90 tablet  2  . omeprazole (PRILOSEC) 20 MG capsule Take 1 capsule (20 mg total) by mouth daily.  30 capsule  11  . predniSONE (DELTASONE) 1 MG tablet Take 9 tablets po qd  270 tablet  0  . predniSONE (DELTASONE) 5 MG tablet TAKE 2 TABLETS  BY MOUTH EVERY DAY  60 tablet  0  . raloxifene (EVISTA) 60 MG tablet Take 1 tablet (60 mg total) by mouth daily.  30 tablet  5  . traMADol (ULTRAM) 50 MG tablet TAKE 1 TABLET (50 MG TOTAL) BY MOUTH EVERY 6 (SIX) HOURS AS NEEDED FOR PAIN.  60 tablet  0  . triamterene-hydrochlorothiazide (MAXZIDE-25) 37.5-25 MG per tablet TAKE 1 TABLET BY MOUTH DAILY.  30 tablet  11   No current facility-administered medications on file prior to visit.  No past surgical history on file. Allergies  Allergen Reactions  . Fosamax [Alendronate Sodium]    History   Social History  . Marital Status: Married    Spouse Name: N/A    Number of Children: N/A  . Years of Education: N/A   Occupational History  . Not on file.   Social History Main Topics  . Smoking status: Never Smoker   . Smokeless tobacco: Not on file  . Alcohol Use: Not on file  . Drug Use: Not on file  . Sexual Activity: Not on file   Other Topics Concern  . Not on file   Social History Narrative  . No narrative on file  Review of Systems  All other systems reviewed and are negative.      Objective:   Physical Exam  Vitals reviewed. Neck: Neck supple. No JVD present.  Cardiovascular: Normal rate, regular rhythm and normal heart sounds.   No murmur heard. Pulmonary/Chest: Effort normal and breath sounds normal. No respiratory distress. She has no wheezes. She has no rales.  Abdominal: Soft. Bowel sounds are normal. She exhibits no distension. There is no tenderness. There is no rebound and no guarding.  Musculoskeletal: She exhibits no edema.  Lymphadenopathy:    She has no cervical adenopathy.          Assessment & Plan:  1. Pain with urination Believe the patient has a urinary tract infection. Begin Cipro. - Urinalysis, Routine w reflex microscopic - ciprofloxacin (CIPRO) 500 MG tablet; Take 1 tablet (500 mg total) by mouth 2 (two) times daily.  Dispense: 6 tablet; Refill: 0  2. PMR (polymyalgia  rheumatica) Maintain the patient on prednisone 4 mg by mouth daily for one month. Followup in one month. She can supplement with meloxicam an as-needed basis. The patient is stable after one month I will continue to try to wean her off prednisone. - CBC with Differential - COMPLETE METABOLIC PANEL WITH GFR

## 2014-08-12 ENCOUNTER — Encounter: Payer: Self-pay | Admitting: *Deleted

## 2014-08-14 LAB — URINE CULTURE: Colony Count: >=100000

## 2014-08-15 ENCOUNTER — Other Ambulatory Visit: Payer: Self-pay | Admitting: Family Medicine

## 2014-08-15 NOTE — Telephone Encounter (Signed)
Medication called to pharmacy. 

## 2014-08-15 NOTE — Telephone Encounter (Signed)
Ok to refill??  Last office visit 08/11/2014.  Last refill 07/15/2014.

## 2014-08-15 NOTE — Telephone Encounter (Signed)
ok 

## 2014-08-23 ENCOUNTER — Telehealth: Payer: Self-pay | Admitting: Family Medicine

## 2014-08-23 MED ORDER — PREDNISONE 1 MG PO TABS: 3.0000 mg | ORAL_TABLET | Freq: Every day | ORAL | Status: DC

## 2014-08-23 NOTE — Telephone Encounter (Signed)
[phone] [facility]  Patient has questions about her prednisone and when she runs out on the 29th does she need another rx or does she need to come in

## 2014-08-23 NOTE — Telephone Encounter (Signed)
Pt is currently on 4 mg of prednisone daily. Per [facility] needs to decrease dose by 1 mg q month. So she needs to be on 3mg  po qd x 1 month. Pt aware and med sent to pharm.

## 2014-09-07 ENCOUNTER — Other Ambulatory Visit: Payer: Self-pay | Admitting: Family Medicine

## 2014-09-12 ENCOUNTER — Ambulatory Visit (INDEPENDENT_AMBULATORY_CARE_PROVIDER_SITE_OTHER): Payer: Medicare HMO | Admitting: Family Medicine

## 2014-09-12 ENCOUNTER — Encounter: Payer: Self-pay | Admitting: Family Medicine

## 2014-09-12 VITALS — BP 126/66 | HR 78 | Temp 97.8°F | Resp 16 | Ht <= 58 in | Wt 186.0 lb

## 2014-09-12 DIAGNOSIS — Z23 Encounter for immunization: Secondary | ICD-10-CM

## 2014-09-12 DIAGNOSIS — M353 Polymyalgia rheumatica: Secondary | ICD-10-CM

## 2014-09-12 DIAGNOSIS — G4700 Insomnia, unspecified: Secondary | ICD-10-CM

## 2014-09-12 NOTE — Progress Notes (Signed)
Subjective:    Patient ID: [name], female    DOB: [birth year], 80 y.o.   MRN: [id number]  HPI 08/11/14 Patient has a history of polymyalgia rheumatica. She has been able to wean herself down to 4 mg of prednisone daily however last week after weaning herself down off the milligrams she developed diffuse pain in both hips and in her lower back. I was able to successfully control her pain by adding meloxicam 7.5 mg daily. She is no longer in any pain. She does complain of some mild dysuria and frequency and urgency over the last 3 days. Urinalysis is significant for bacteria as well as leukocyte esterase. At that time, my plan was: PMR (polymyalgia rheumatica) Maintain the patient on prednisone 4 mg by mouth daily for one month. Followup in one month. She can supplement with meloxicam an as-needed basis. The patient is stable after one month I will continue to try to wean her off prednisone. - CBC with Differential - COMPLETE METABOLIC PANEL WITH GFR  0/93/81 Patient states that she is doing well.  She actually weaned herself to 3 mg poqday 2 weeks ago without worsening of pain in her hips or shoulder girdle muscles.  She does have some LBP and knee pain likely due to arthritis.  She does report insomnia which is not helped by xanax.  SHe is able to sleep 1-2 hrs and then tosses and turns all night.  Past Medical History  Diagnosis Date  . Hyperlipidemia   . Hypertension   . Hypothyroid   . Sleep apnea 04/2012    Mild/ AHI 10/CPAP 7cm h2o w/2 L o2  . Osteoporosis    Current Outpatient Prescriptions on File Prior to Visit  Medication Sig Dispense Refill  . ALPRAZolam (XANAX) 0.25 MG tablet Take 1 tablet (0.25 mg total) by mouth at bedtime.  30 tablet  0  . amLODipine (NORVASC) 10 MG tablet TAKE 1 TABLET EVERY DAY  90 tablet  3  . calcium carbonate 1250 MG capsule Take 1,250 mg by mouth 2 (two) times daily with a meal.      . cholecalciferol (VITAMIN D) 1000 UNITS tablet Take 1,000  Units by mouth daily.      . clotrimazole-betamethasone (LOTRISONE) cream APPLY TO AFFECTED AREA TWICE DAILY FOR 10 DAYS  30 g  0  . cyanocobalamin 1000 MCG tablet Take 100 mcg by mouth daily.      Marland Kitchen HYDROcodone-acetaminophen (NORCO/VICODIN) 5-325 MG per tablet TAKE 1 TABLET EVERY 6 HOURS AS NEEDED FOR PAIN  30 tablet  0  . meloxicam (MOBIC) 7.5 MG tablet TAKE 2 TABLETS (15 MG TOTAL) BY MOUTH DAILY.  60 tablet  2  . metoprolol (LOPRESSOR) 50 MG tablet TAKE 1/2 TABLET ONCE DAILY  90 tablet  3  . omeprazole (PRILOSEC) 20 MG capsule Take 1 capsule (20 mg total) by mouth daily.  30 capsule  11  . predniSONE (DELTASONE) 1 MG tablet Take 3 tablets (3 mg total) by mouth daily with breakfast.  90 tablet  0  . raloxifene (EVISTA) 60 MG tablet Take 1 tablet (60 mg total) by mouth daily.  30 tablet  5  . traMADol (ULTRAM) 50 MG tablet TAKE 1 TABLET EVERY 6 HOURS AS NEEDED FOR PAIN  60 tablet  0  . triamterene-hydrochlorothiazide (MAXZIDE-25) 37.5-25 MG per tablet TAKE 1 TABLET BY MOUTH DAILY.  30 tablet  11   No current facility-administered medications on file prior to visit.  No past surgical history  on file. Allergies  Allergen Reactions  . Fosamax [Alendronate Sodium]    History   Social History  . Marital Status: Married    Spouse Name: N/A    Number of Children: N/A  . Years of Education: N/A   Occupational History  . Not on file.   Social History Main Topics  . Smoking status: Never Smoker   . Smokeless tobacco: Not on file  . Alcohol Use: Not on file  . Drug Use: Not on file  . Sexual Activity: Not on file   Other Topics Concern  . Not on file   Social History Narrative  . No narrative on file      Review of Systems  All other systems reviewed and are negative.      Objective:   Physical Exam  Vitals reviewed. Neck: Neck supple. No JVD present.  Cardiovascular: Normal rate, regular rhythm and normal heart sounds.   No murmur heard. Pulmonary/Chest: Effort normal  and breath sounds normal. No respiratory distress. She has no wheezes. She has no rales.  Abdominal: Soft. Bowel sounds are normal. She exhibits no distension. There is no tenderness. There is no rebound and no guarding.  Musculoskeletal: She exhibits no edema.  Lymphadenopathy:    She has no cervical adenopathy.          Assessment & Plan:  Need for prophylactic vaccination and inoculation against influenza - Plan: Flu Vaccine QUAD 36+ mos IM  PMR (polymyalgia rheumatica)  Insomnia  Decrease prednisone by 1 mg every month until off medication alltogether.  She will be on 2 mg poqday in October and 1 mg poqday in November then DC.  Increase xanax to 0.5 mg pohs and if ineffective, try trazodone.

## 2014-09-14 ENCOUNTER — Other Ambulatory Visit: Payer: Self-pay | Admitting: Family Medicine

## 2014-09-14 NOTE — Telephone Encounter (Signed)
?   OK to Refill  

## 2014-09-15 NOTE — Telephone Encounter (Signed)
ok 

## 2014-09-20 ENCOUNTER — Other Ambulatory Visit: Payer: Self-pay | Admitting: Family Medicine

## 2014-09-20 NOTE — Telephone Encounter (Signed)
Refill appropriate and filled per protocol. 

## 2014-10-03 ENCOUNTER — Other Ambulatory Visit: Payer: Self-pay | Admitting: Family Medicine

## 2014-10-12 ENCOUNTER — Other Ambulatory Visit: Payer: Self-pay | Admitting: Family Medicine

## 2014-10-19 ENCOUNTER — Other Ambulatory Visit: Payer: Self-pay | Admitting: Family Medicine

## 2014-10-20 NOTE — Telephone Encounter (Signed)
Refill appropriate and filled per protocol. 

## 2014-10-23 ENCOUNTER — Other Ambulatory Visit: Payer: Self-pay | Admitting: Family Medicine

## 2014-10-24 NOTE — Telephone Encounter (Signed)
ok 

## 2014-10-24 NOTE — Telephone Encounter (Signed)
Ok to refill??  Last office visit 09/12/2014.  Last refill 08/15/2014.

## 2014-11-03 ENCOUNTER — Encounter: Payer: Self-pay | Admitting: Family Medicine

## 2014-11-03 ENCOUNTER — Ambulatory Visit (INDEPENDENT_AMBULATORY_CARE_PROVIDER_SITE_OTHER): Payer: Medicare HMO | Admitting: Family Medicine

## 2014-11-03 VITALS — BP 124/70 | HR 76 | Temp 98.3°F | Resp 16 | Ht <= 58 in | Wt 183.0 lb

## 2014-11-03 DIAGNOSIS — M722 Plantar fascial fibromatosis: Secondary | ICD-10-CM

## 2014-11-03 NOTE — Progress Notes (Signed)
Subjective:    Patient ID: [name], female    DOB: 01/10/1933, 80 y.o.   MRN: [id number]  HPI Patient has had a bone spur on her left heel for years. She has received numerous cortisone injections and that he'll do her orthopedist. Over the last 2 months she has developed severe pain in her left heel on the plantar surface near the insertion of the plantar fascia.  She is requesting a cortisone injection today. Past Medical History  Diagnosis Date  . Hyperlipidemia   . Hypertension   . Hypothyroid   . Sleep apnea 04/2012    Mild/ AHI 10/CPAP 7cm h2o w/2 L o2  . Osteoporosis    No past surgical history on file. Current Outpatient Prescriptions on File Prior to Visit  Medication Sig Dispense Refill  . ALPRAZolam (XANAX) 0.25 MG tablet TAKE 1 TABLET BY MOUTH AT BEDTIME AS NEEDED 30 tablet 2  . amLODipine (NORVASC) 10 MG tablet TAKE 1 TABLET EVERY DAY 90 tablet 3  . calcium carbonate 1250 MG capsule Take 1,250 mg by mouth 2 (two) times daily with a meal.    . cholecalciferol (VITAMIN D) 1000 UNITS tablet Take 1,000 Units by mouth daily.    . clotrimazole-betamethasone (LOTRISONE) cream APPLY TO AFFECTED AREA TWICE DAILY FOR 10 DAYS 30 g 0  . cyanocobalamin 1000 MCG tablet Take 100 mcg by mouth daily.    Marland Kitchen HYDROcodone-acetaminophen (NORCO/VICODIN) 5-325 MG per tablet TAKE 1 TABLET EVERY 6 HOURS AS NEEDED FOR PAIN 30 tablet 0  . meloxicam (MOBIC) 7.5 MG tablet TAKE 2 TABLETS (15 MG TOTAL) BY MOUTH DAILY. 60 tablet 2  . metoprolol (LOPRESSOR) 50 MG tablet TAKE 1/2 TABLET ONCE DAILY 90 tablet 3  . omeprazole (PRILOSEC) 20 MG capsule TAKE 1 CAPSULE (20 MG TOTAL) BY MOUTH DAILY. 30 capsule 11  . predniSONE (DELTASONE) 1 MG tablet TAKE 3 TABLETS (3 MG TOTAL) BY MOUTH DAILY WITH BREAKFAST. 90 tablet 3  . raloxifene (EVISTA) 60 MG tablet Take 1 tablet (60 mg total) by mouth daily. 30 tablet 5  . traMADol (ULTRAM) 50 MG tablet TAKE 1 TABLET BY MOUTH EVERY 6 HOURS AS NEEDED FOR PAIN 60  tablet 1  . triamterene-hydrochlorothiazide (MAXZIDE-25) 37.5-25 MG per tablet TAKE 1 TABLET BY MOUTH DAILY. 30 tablet 11   No current facility-administered medications on file prior to visit.   Allergies  Allergen Reactions  . Fosamax [Alendronate Sodium]    History   Social History  . Marital Status: Married    Spouse Name: N/A    Number of Children: N/A  . Years of Education: N/A   Occupational History  . Not on file.   Social History Main Topics  . Smoking status: Never Smoker   . Smokeless tobacco: Not on file  . Alcohol Use: Not on file  . Drug Use: Not on file  . Sexual Activity: Not on file   Other Topics Concern  . Not on file   Social History Narrative      Review of Systems  All other systems reviewed and are negative.      Objective:   Physical Exam  Cardiovascular: Normal rate, regular rhythm and normal heart sounds.   Pulmonary/Chest: Effort normal and breath sounds normal.  Vitals reviewed. Patient is tender to palpation on the plantar aspect of her left calcaneus. There is a large 3 cm callus and a palpable bone spur in that area.        Assessment &  Plan:  Plantar fasciitis of left foot  Using sterile technique, I injected the insertion of the plantar fascia with a mixture of 1 mL of 0.1% lidocaine and 1 mL of 40 mg per mL Kenalog. Patient tolerated the procedure well without complication.

## 2014-11-29 ENCOUNTER — Telehealth: Payer: Self-pay | Admitting: Family Medicine

## 2014-11-29 NOTE — Telephone Encounter (Signed)
Resume 3 mg poqday and recheck in 2 weeks.

## 2014-11-29 NOTE — Telephone Encounter (Signed)
[phone] Patient says she took her last dose of prednisone yesterday and she is not doing much better

## 2014-11-30 NOTE — Telephone Encounter (Signed)
Pt states that she was just having some increased back pain but she thinks it was due to the whether. She does not want to restart the prednisone at this point. If she gets worse she will call us back.

## 2014-12-12 ENCOUNTER — Telehealth: Payer: Self-pay | Admitting: Family Medicine

## 2014-12-12 MED ORDER — RALOXIFENE HCL 60 MG PO TABS
60.0000 mg | ORAL_TABLET | Freq: Every day | ORAL | Status: DC
Start: 2014-12-12 — End: 2015-02-03

## 2014-12-12 NOTE — Telephone Encounter (Signed)
Patient is calling to say when she called pharmacy about her raloxifene, they are giving her a hard time getting it please call her  [phone]

## 2014-12-12 NOTE — Telephone Encounter (Signed)
Refill appropriate and filled per protocol. 

## 2014-12-13 ENCOUNTER — Other Ambulatory Visit: Payer: Self-pay | Admitting: Family Medicine

## 2014-12-13 NOTE — Telephone Encounter (Signed)
LOV 11/5.  LRF Xanax 9/17 #30 + 2.  OK refill?  Hasn't Rf Mobic since beginning of year?

## 2014-12-15 NOTE — Telephone Encounter (Signed)
Ok with both

## 2014-12-15 NOTE — Telephone Encounter (Signed)
meds refilled 

## 2015-02-03 ENCOUNTER — Other Ambulatory Visit: Payer: Self-pay | Admitting: *Deleted

## 2015-02-03 MED ORDER — RALOXIFENE HCL 60 MG PO TABS: 60.0000 mg | ORAL_TABLET | Freq: Every day | ORAL | Status: DC

## 2015-02-03 MED ORDER — MELOXICAM 7.5 MG PO TABS: ORAL_TABLET | ORAL | Status: DC

## 2015-02-03 MED ORDER — LEVOTHYROXINE SODIUM 137 MCG PO TABS: 137.0000 ug | ORAL_TABLET | Freq: Every day | ORAL | Status: DC

## 2015-02-03 NOTE — Telephone Encounter (Signed)
Received fax requesting refill on raloxifene, Mobic, and levothyroxine with 90 day supplies.   Refill appropriate and filled per protocol.

## 2015-02-06 ENCOUNTER — Other Ambulatory Visit: Payer: Self-pay | Admitting: Family Medicine

## 2015-02-06 MED ORDER — RALOXIFENE HCL 60 MG PO TABS: 60.0000 mg | ORAL_TABLET | Freq: Every day | ORAL | Status: DC

## 2015-02-06 MED ORDER — MELOXICAM 7.5 MG PO TABS: ORAL_TABLET | ORAL | Status: DC

## 2015-02-06 MED ORDER — LEVOTHYROXINE SODIUM 137 MCG PO TABS: 137.0000 ug | ORAL_TABLET | Freq: Every day | ORAL | Status: DC

## 2015-02-06 NOTE — Telephone Encounter (Signed)
Med refilled for 90 day supply sent to pharm

## 2015-02-17 ENCOUNTER — Inpatient Hospital Stay (HOSPITAL_COMMUNITY)
Admission: EM | Admit: 2015-02-17 | Discharge: 2015-02-21 | DRG: 249 | Disposition: A | Discharge status: Home Health | Payer: Medicare Other | Attending: Internal Medicine | Admitting: Internal Medicine

## 2015-02-17 ENCOUNTER — Inpatient Hospital Stay (HOSPITAL_COMMUNITY): Payer: Medicare Other

## 2015-02-17 ENCOUNTER — Encounter (HOSPITAL_COMMUNITY): Payer: Self-pay | Admitting: Emergency Medicine

## 2015-02-17 ENCOUNTER — Emergency Department (HOSPITAL_COMMUNITY): Payer: Medicare Other

## 2015-02-17 DIAGNOSIS — I4891 Unspecified atrial fibrillation: Secondary | ICD-10-CM

## 2015-02-17 DIAGNOSIS — M810 Age-related osteoporosis without current pathological fracture: Secondary | ICD-10-CM | POA: Diagnosis present

## 2015-02-17 DIAGNOSIS — I214 Non-ST elevation (NSTEMI) myocardial infarction: Secondary | ICD-10-CM | POA: Diagnosis not present

## 2015-02-17 DIAGNOSIS — I209 Angina pectoris, unspecified: Secondary | ICD-10-CM | POA: Diagnosis not present

## 2015-02-17 DIAGNOSIS — G4733 Obstructive sleep apnea (adult) (pediatric): Secondary | ICD-10-CM | POA: Diagnosis present

## 2015-02-17 DIAGNOSIS — I2510 Atherosclerotic heart disease of native coronary artery without angina pectoris: Secondary | ICD-10-CM | POA: Diagnosis not present

## 2015-02-17 DIAGNOSIS — R079 Chest pain, unspecified: Secondary | ICD-10-CM | POA: Diagnosis not present

## 2015-02-17 DIAGNOSIS — R072 Precordial pain: Secondary | ICD-10-CM

## 2015-02-17 DIAGNOSIS — I25110 Atherosclerotic heart disease of native coronary artery with unstable angina pectoris: Secondary | ICD-10-CM | POA: Diagnosis not present

## 2015-02-17 DIAGNOSIS — B372 Candidiasis of skin and nail: Secondary | ICD-10-CM | POA: Diagnosis present

## 2015-02-17 DIAGNOSIS — I4819 Other persistent atrial fibrillation: Secondary | ICD-10-CM | POA: Diagnosis present

## 2015-02-17 DIAGNOSIS — Z79899 Other long term (current) drug therapy: Secondary | ICD-10-CM

## 2015-02-17 DIAGNOSIS — E039 Hypothyroidism, unspecified: Secondary | ICD-10-CM | POA: Diagnosis not present

## 2015-02-17 DIAGNOSIS — I481 Persistent atrial fibrillation: Secondary | ICD-10-CM | POA: Diagnosis not present

## 2015-02-17 DIAGNOSIS — Z8249 Family history of ischemic heart disease and other diseases of the circulatory system: Secondary | ICD-10-CM | POA: Diagnosis not present

## 2015-02-17 DIAGNOSIS — E079 Disorder of thyroid, unspecified: Secondary | ICD-10-CM

## 2015-02-17 DIAGNOSIS — Z7952 Long term (current) use of systemic steroids: Secondary | ICD-10-CM | POA: Diagnosis not present

## 2015-02-17 DIAGNOSIS — I10 Essential (primary) hypertension: Secondary | ICD-10-CM | POA: Diagnosis present

## 2015-02-17 DIAGNOSIS — J9811 Atelectasis: Secondary | ICD-10-CM | POA: Diagnosis not present

## 2015-02-17 DIAGNOSIS — E785 Hyperlipidemia, unspecified: Secondary | ICD-10-CM | POA: Diagnosis present

## 2015-02-17 DIAGNOSIS — G4730 Sleep apnea, unspecified: Secondary | ICD-10-CM | POA: Diagnosis present

## 2015-02-17 DIAGNOSIS — B377 Candidal sepsis: Secondary | ICD-10-CM | POA: Diagnosis present

## 2015-02-17 HISTORY — DX: Candidal sepsis: B37.7

## 2015-02-17 HISTORY — DX: Unspecified atrial fibrillation: I48.91

## 2015-02-17 HISTORY — DX: Atherosclerotic heart disease of native coronary artery without angina pectoris: I25.10

## 2015-02-17 HISTORY — DX: Personal history of other medical treatment: Z92.89

## 2015-02-17 LAB — I-STAT CHEM 8, ED
BUN: 26 mg/dL — ABNORMAL HIGH (ref 6–23)
CALCIUM ION: 0.99 mmol/L — AB (ref 1.13–1.30)
Chloride: 105 mmol/L (ref 96–112)
Creatinine, Ser: 1.1 mg/dL (ref 0.50–1.10)
Glucose, Bld: 109 mg/dL — ABNORMAL HIGH (ref 70–99)
HCT: 49 % — ABNORMAL HIGH (ref 36.0–46.0)
Hemoglobin: 16.7 g/dL — ABNORMAL HIGH (ref 12.0–15.0)
Potassium: 5.5 mmol/L — ABNORMAL HIGH (ref 3.5–5.1)
Sodium: 138 mmol/L (ref 135–145)
TCO2: 21 mmol/L (ref 0–100)

## 2015-02-17 LAB — CBC WITH DIFFERENTIAL/PLATELET
BASOS PCT: 0 % (ref 0–1)
Basophils Absolute: 0 10*3/uL (ref 0.0–0.1)
Eosinophils Absolute: 0.3 10*3/uL (ref 0.0–0.7)
Eosinophils Relative: 4 % (ref 0–5)
HCT: 46.5 % — ABNORMAL HIGH (ref 36.0–46.0)
Hemoglobin: 16.1 g/dL — ABNORMAL HIGH (ref 12.0–15.0)
LYMPHS ABS: 1.7 10*3/uL (ref 0.7–4.0)
Lymphocytes Relative: 25 % (ref 12–46)
MCH: 29.5 pg (ref 26.0–34.0)
MCHC: 34.6 g/dL (ref 30.0–36.0)
MCV: 85.3 fL (ref 78.0–100.0)
Monocytes Absolute: 0.4 10*3/uL (ref 0.1–1.0)
Monocytes Relative: 6 % (ref 3–12)
NEUTROS PCT: 65 % (ref 43–77)
Neutro Abs: 4.4 10*3/uL (ref 1.7–7.7)
PLATELETS: 213 10*3/uL (ref 150–400)
RBC: 5.45 MIL/uL — AB (ref 3.87–5.11)
RDW: 13.3 % (ref 11.5–15.5)
WBC: 6.7 10*3/uL (ref 4.0–10.5)

## 2015-02-17 LAB — BASIC METABOLIC PANEL
Anion gap: 10 (ref 5–15)
BUN: 17 mg/dL (ref 6–23)
CALCIUM: 9.3 mg/dL (ref 8.4–10.5)
CO2: 23 mmol/L (ref 19–32)
CREATININE: 1.17 mg/dL — AB (ref 0.50–1.10)
Chloride: 106 mmol/L (ref 96–112)
GFR calc non Af Amer: 43 mL/min — ABNORMAL LOW (ref >90)
GFR, EST AFRICAN AMERICAN: 50 mL/min — AB (ref >90)
Glucose, Bld: 109 mg/dL — ABNORMAL HIGH (ref 70–99)
Potassium: 3.5 mmol/L (ref 3.5–5.1)
Sodium: 139 mmol/L (ref 135–145)

## 2015-02-17 LAB — I-STAT TROPONIN, ED: Troponin i, poc: 0.05 ng/mL (ref 0.00–0.08)

## 2015-02-17 LAB — HEPARIN LEVEL (UNFRACTIONATED): HEPARIN UNFRACTIONATED: 0.31 [IU]/mL (ref 0.30–0.70)

## 2015-02-17 LAB — D-DIMER, QUANTITATIVE (NOT AT ARMC): D DIMER QUANT: 0.76 ug{FEU}/mL — AB (ref 0.00–0.48)

## 2015-02-17 LAB — TROPONIN I: Troponin I: 11.77 ng/mL (ref <0.031)

## 2015-02-17 MED ORDER — SODIUM CHLORIDE 0.9 % IV SOLN: 250.0000 mL | INTRAVENOUS | Status: DC | PRN

## 2015-02-17 MED ORDER — PANTOPRAZOLE SODIUM 40 MG PO TBEC
40.0000 mg | DELAYED_RELEASE_TABLET | Freq: Every day | ORAL | Status: DC
  Administered 2015-02-17 – 2015-02-21 (×5): 40 mg via ORAL
  Filled 2015-02-17 (×5): qty 1

## 2015-02-17 MED ORDER — ONDANSETRON HCL 4 MG/2ML IJ SOLN: 4.0000 mg | Freq: Four times a day (QID) | INTRAMUSCULAR | Status: DC | PRN

## 2015-02-17 MED ORDER — NITROGLYCERIN 0.4 MG SL SUBL
0.4000 mg | SUBLINGUAL_TABLET | SUBLINGUAL | Status: DC | PRN
  Administered 2015-02-18: 0.4 mg via SUBLINGUAL
  Filled 2015-02-17: qty 1

## 2015-02-17 MED ORDER — NITROGLYCERIN 0.4 MG SL SUBL
0.4000 mg | SUBLINGUAL_TABLET | Freq: Once | SUBLINGUAL | Status: AC
  Administered 2015-02-17: 0.4 mg via SUBLINGUAL
  Filled 2015-02-17: qty 1

## 2015-02-17 MED ORDER — LEVOTHYROXINE SODIUM 137 MCG PO TABS
137.0000 ug | ORAL_TABLET | Freq: Every day | ORAL | Status: DC
  Administered 2015-02-18 – 2015-02-21 (×4): 137 ug via ORAL
  Filled 2015-02-17 (×9): qty 1

## 2015-02-17 MED ORDER — HYDROCODONE-ACETAMINOPHEN 5-325 MG PO TABS
1.0000 | ORAL_TABLET | Freq: Four times a day (QID) | ORAL | Status: DC | PRN
  Administered 2015-02-17 – 2015-02-20 (×2): 1 via ORAL
  Filled 2015-02-17 (×2): qty 1

## 2015-02-17 MED ORDER — IOHEXOL 350 MG/ML SOLN
80.0000 mL | Freq: Once | INTRAVENOUS | Status: AC | PRN
  Administered 2015-02-17: 80 mL via INTRAVENOUS

## 2015-02-17 MED ORDER — METOPROLOL TARTRATE 25 MG PO TABS
25.0000 mg | ORAL_TABLET | Freq: Every day | ORAL | Status: DC
  Administered 2015-02-17 – 2015-02-19 (×3): 25 mg via ORAL
  Filled 2015-02-17 (×3): qty 1

## 2015-02-17 MED ORDER — RALOXIFENE HCL 60 MG PO TABS
60.0000 mg | ORAL_TABLET | Freq: Every day | ORAL | Status: DC
  Administered 2015-02-17 – 2015-02-21 (×4): 60 mg via ORAL
  Filled 2015-02-17 (×5): qty 1

## 2015-02-17 MED ORDER — SODIUM CHLORIDE 0.9 % IJ SOLN
3.0000 mL | Freq: Two times a day (BID) | INTRAMUSCULAR | Status: DC
  Administered 2015-02-17 – 2015-02-19 (×3): 3 mL via INTRAVENOUS

## 2015-02-17 MED ORDER — HEPARIN (PORCINE) IN NACL 100-0.45 UNIT/ML-% IJ SOLN
1050.0000 [IU]/h | INTRAMUSCULAR | Status: DC
  Administered 2015-02-17: 900 [IU]/h via INTRAVENOUS
  Administered 2015-02-19: 1050 [IU]/h via INTRAVENOUS
  Filled 2015-02-17 (×4): qty 250

## 2015-02-17 MED ORDER — TRIAMTERENE-HCTZ 37.5-25 MG PO TABS
1.0000 | ORAL_TABLET | Freq: Every day | ORAL | Status: DC
  Administered 2015-02-17 – 2015-02-19 (×3): 1 via ORAL
  Filled 2015-02-17 (×3): qty 1

## 2015-02-17 MED ORDER — VITAMIN D3 25 MCG (1000 UNIT) PO TABS
1000.0000 [IU] | ORAL_TABLET | Freq: Every day | ORAL | Status: DC
  Administered 2015-02-17 – 2015-02-21 (×4): 1000 [IU] via ORAL
  Filled 2015-02-17 (×5): qty 1

## 2015-02-17 MED ORDER — ALPRAZOLAM 0.25 MG PO TABS
0.2500 mg | ORAL_TABLET | Freq: Every day | ORAL | Status: DC
  Administered 2015-02-17 – 2015-02-18 (×2): 0.25 mg via ORAL
  Filled 2015-02-17 (×2): qty 1

## 2015-02-17 MED ORDER — ASPIRIN EC 81 MG PO TBEC
81.0000 mg | DELAYED_RELEASE_TABLET | Freq: Every day | ORAL | Status: DC
  Administered 2015-02-18 – 2015-02-21 (×3): 81 mg via ORAL
  Filled 2015-02-17 (×3): qty 1

## 2015-02-17 MED ORDER — CALCIUM CARBONATE 1250 (500 CA) MG PO TABS
1250.0000 mg | ORAL_TABLET | Freq: Two times a day (BID) | ORAL | Status: DC
  Administered 2015-02-17 – 2015-02-21 (×8): 1250 mg via ORAL
  Filled 2015-02-17 (×2): qty 3
  Filled 2015-02-17: qty 1
  Filled 2015-02-17: qty 3
  Filled 2015-02-17 (×2): qty 1
  Filled 2015-02-17 (×2): qty 3
  Filled 2015-02-17 (×3): qty 1

## 2015-02-17 MED ORDER — TRAMADOL HCL 50 MG PO TABS: 50.0000 mg | ORAL_TABLET | Freq: Four times a day (QID) | ORAL | Status: DC | PRN

## 2015-02-17 MED ORDER — ATORVASTATIN CALCIUM 80 MG PO TABS
80.0000 mg | ORAL_TABLET | Freq: Every day | ORAL | Status: DC
  Administered 2015-02-18 – 2015-02-20 (×3): 80 mg via ORAL
  Filled 2015-02-17 (×5): qty 1

## 2015-02-17 MED ORDER — VITAMIN B-12 100 MCG PO TABS
100.0000 ug | ORAL_TABLET | Freq: Every day | ORAL | Status: DC
  Administered 2015-02-17 – 2015-02-21 (×4): 100 ug via ORAL
  Filled 2015-02-17 (×6): qty 1

## 2015-02-17 MED ORDER — SODIUM CHLORIDE 0.9 % IJ SOLN: 3.0000 mL | INTRAMUSCULAR | Status: DC | PRN

## 2015-02-17 MED ORDER — HEPARIN BOLUS VIA INFUSION
4000.0000 [IU] | Freq: Once | INTRAVENOUS | Status: AC
  Administered 2015-02-17: 4000 [IU] via INTRAVENOUS
  Filled 2015-02-17: qty 4000

## 2015-02-17 MED ORDER — AMLODIPINE BESYLATE 10 MG PO TABS
10.0000 mg | ORAL_TABLET | Freq: Every day | ORAL | Status: DC
  Administered 2015-02-18 – 2015-02-19 (×2): 10 mg via ORAL
  Filled 2015-02-17 (×2): qty 1

## 2015-02-17 MED ORDER — PREDNISONE 1 MG PO TABS
3.0000 mg | ORAL_TABLET | Freq: Every day | ORAL | Status: DC
  Administered 2015-02-18: 3 mg via ORAL
  Filled 2015-02-17 (×6): qty 3

## 2015-02-17 NOTE — H&P (Signed)
ADMISSION HISTORY & PHYSICAL   Chief Complaint:  Chest pain  Cardiologist: None (NEW)  Primary Care Physician: Odette Fraction, MD  HPI:  This is a 81 y.o. female with a past medical history significant for hypertension, hyperlipidemia, hypothyroidism and sleep apnea. She also has osteoporosis and is on long-term steroid therapy of prednisone 3 mg daily. She reports acute onset chest pain that started today. She reports that it felt like it started in the upper abdomen and lower chest and spread across the left and right sides of her chest. She also reports pain in her back which is worse on palpation over the mid thoracic spine that radiates around the left chest under the left axilla. This pain lasted for about an hour this morning after she got out of the bathtub and her husband brought her to the emergency room. She received 2 nitroglycerin, with significant relief with the first nitroglycerin and minimal change after the second nitroglycerin. She reported her pain was a 50 out of 10 initially, now it is has decreased to about 5 out of 10 in his focal over the left apex area. This is also tender to palpation. It was noted that she is in atrial fibrillation with controlled ventricular response, which was not previously noted and is likely a new diagnosis for her. D-dimer was obtained in the ER and is elevated at 0.76. EKG shows atrial fibrillation with controlled ventricular response and there was mild ST segment changes initially inferiorly on her EKG which improved on her subsequent EKG. Initial troponin is normal at 0.05.   PMHx:  Past Medical History  Diagnosis Date  . Hyperlipidemia   . Hypertension   . Hypothyroid   . Sleep apnea 04/2012    Mild/ AHI 10/CPAP 7cm h2o w/2 L o2  . Osteoporosis     History reviewed. No pertinent past surgical history.  FAMHx:  Family History  Problem Relation Age of Onset  . Coronary artery disease Father   . Diabetes Father   . Stroke  Mother   . Arthritis Mother     SOCHx:   reports that she has never smoked. She does not have any smokeless tobacco history on file. Her alcohol and drug histories are not on file.  ALLERGIES:  Allergies  Allergen Reactions  . Fosamax [Alendronate Sodium]     ROS: A comprehensive review of systems was negative except for: Respiratory: positive for dyspnea on exertion and pleurisy/chest pain Cardiovascular: positive for chest pain Gastrointestinal: positive for nausea and vomiting  HOME MEDS:   Medication List    ASK your doctor about these medications        ALPRAZolam 0.25 MG tablet  Commonly known as:  XANAX  Take 0.25 mg by mouth at bedtime.     amLODipine 10 MG tablet  Commonly known as:  NORVASC  Take 10 mg by mouth daily.     calcium carbonate 1250 MG capsule  Take 1,250 mg by mouth 2 (two) times daily with a meal.     cholecalciferol 1000 UNITS tablet  Commonly known as:  VITAMIN D  Take 1,000 Units by mouth daily.     cyanocobalamin 1000 MCG tablet  Take 100 mcg by mouth daily.     HYDROcodone-acetaminophen 5-325 MG per tablet  Commonly known as:  NORCO/VICODIN  Take 1 tablet by mouth every 6 (six) hours as needed for moderate pain.     levothyroxine 137 MCG tablet  Commonly known as:  SYNTHROID, LEVOTHROID  Take 1 tablet (137 mcg total) by mouth daily.     meloxicam 7.5 MG tablet  Commonly known as:  MOBIC  TAKE 2 TABLETS (15 MG TOTAL) BY MOUTH DAILY.     metoprolol 50 MG tablet  Commonly known as:  LOPRESSOR  Take 25 mg by mouth daily.     omeprazole 20 MG capsule  Commonly known as:  PRILOSEC  Take 20 mg by mouth daily.     predniSONE 1 MG tablet  Commonly known as:  DELTASONE  Take 3 mg by mouth daily with breakfast.     raloxifene 60 MG tablet  Commonly known as:  EVISTA  Take 1 tablet (60 mg total) by mouth daily.     traMADol 50 MG tablet  Commonly known as:  ULTRAM  Take 50 mg by mouth every 6 (six) hours as needed for moderate  pain.     triamterene-hydrochlorothiazide 37.5-25 MG per tablet  Commonly known as:  MAXZIDE-25  Take 1 tablet by mouth daily.        LABS/IMAGING: Results for orders placed or performed during the hospital encounter of 02/17/15 (from the past 48 hour(s))  CBC with Differential     Status: Abnormal   Collection Time: 02/17/15  9:00 AM  Result Value Ref Range   WBC 6.7 4.0 - 10.5 K/uL   RBC 5.45 (H) 3.87 - 5.11 MIL/uL   Hemoglobin 16.1 (H) 12.0 - 15.0 g/dL   HCT 46.5 (H) 36.0 - 46.0 %   MCV 85.3 78.0 - 100.0 fL   MCH 29.5 26.0 - 34.0 pg   MCHC 34.6 30.0 - 36.0 g/dL   RDW 13.3 11.5 - 15.5 %   Platelets 213 150 - 400 K/uL   Neutrophils Relative % 65 43 - 77 %   Neutro Abs 4.4 1.7 - 7.7 K/uL   Lymphocytes Relative 25 12 - 46 %   Lymphs Abs 1.7 0.7 - 4.0 K/uL   Monocytes Relative 6 3 - 12 %   Monocytes Absolute 0.4 0.1 - 1.0 K/uL   Eosinophils Relative 4 0 - 5 %   Eosinophils Absolute 0.3 0.0 - 0.7 K/uL   Basophils Relative 0 0 - 1 %   Basophils Absolute 0.0 0.0 - 0.1 K/uL  I-stat troponin, ED     Status: None   Collection Time: 02/17/15  9:07 AM  Result Value Ref Range   Troponin i, poc 0.05 0.00 - 0.08 ng/mL   Comment 3            Comment: Due to the release kinetics of cTnI, a negative result within the first hours of the onset of symptoms does not rule out myocardial infarction with certainty. If myocardial infarction is still suspected, repeat the test at appropriate intervals.   I-stat Chem 8, ED     Status: Abnormal   Collection Time: 02/17/15  9:08 AM  Result Value Ref Range   Sodium 138 135 - 145 mmol/L   Potassium 5.5 (H) 3.5 - 5.1 mmol/L   Chloride 105 96 - 112 mmol/L   BUN 26 (H) 6 - 23 mg/dL   Creatinine, Ser 1.10 0.50 - 1.10 mg/dL   Glucose, Bld 109 (H) 70 - 99 mg/dL   Calcium, Ion 0.99 (L) 1.13 - 1.30 mmol/L   TCO2 21 0 - 100 mmol/L   Hemoglobin 16.7 (H) 12.0 - 15.0 g/dL   HCT 49.0 (H) 36.0 - 84.6 %  Basic metabolic panel     Status: Abnormal  Collection Time: 02/17/15  9:23 AM  Result Value Ref Range   Sodium 139 135 - 145 mmol/L   Potassium 3.5 3.5 - 5.1 mmol/L   Chloride 106 96 - 112 mmol/L   CO2 23 19 - 32 mmol/L   Glucose, Bld 109 (H) 70 - 99 mg/dL   BUN 17 6 - 23 mg/dL   Creatinine, Ser 1.17 (H) 0.50 - 1.10 mg/dL   Calcium 9.3 8.4 - 10.5 mg/dL   GFR calc non Af Amer 43 (L) >90 mL/min   GFR calc Af Amer 50 (L) >90 mL/min    Comment: (NOTE) The eGFR has been calculated using the CKD EPI equation. This calculation has not been validated in all clinical situations. eGFR's persistently <90 mL/min signify possible Chronic Kidney Disease.    Anion gap 10 5 - 15  D-dimer, quantitative     Status: Abnormal   Collection Time: 02/17/15 10:00 AM  Result Value Ref Range   D-Dimer, Quant 0.76 (H) 0.00 - 0.48 ug/mL-FEU    Comment:        AT THE INHOUSE ESTABLISHED CUTOFF VALUE OF 0.48 ug/mL FEU, THIS ASSAY HAS BEEN DOCUMENTED IN THE LITERATURE TO HAVE A SENSITIVITY AND NEGATIVE PREDICTIVE VALUE OF AT LEAST 98 TO 99%.  THE TEST RESULT SHOULD BE CORRELATED WITH AN ASSESSMENT OF THE CLINICAL PROBABILITY OF DVT / VTE.    Dg Chest 2 View  02/17/2015   CLINICAL DATA:  One day history of chest pain  EXAM: CHEST  2 VIEW  COMPARISON:  April 27, 2012  FINDINGS: There is no edema or consolidation. There is mild atelectatic change in the right middle lobe anteriorly. Heart is enlarged with pulmonary vascularity within normal limits. No adenopathy. There is degenerative change in the thoracic spine.  IMPRESSION: Atelectatic change right middle lobe anteriorly. No edema or consolidation.   Electronically Signed   By: Lowella Grip III M.D.   On: 02/17/2015 09:15    VITALS: Filed Vitals:   02/17/15 1330  BP: 135/84  Pulse: 100  Temp:   Resp: 13    EXAM: General appearance: alert, appears older than stated age and mild distress Neck: no carotid bruit and no JVD Lungs: clear to auscultation bilaterally Heart: irregularly  irregular rhythm and No murmur Abdomen: soft, non-tender; bowel sounds normal; no masses,  no organomegaly Extremities: edema None and Pain on palpation over the mid thoracic spine with pain that radiates around the left chest to the left axilla Pulses: 2+ and symmetric Skin: Pale, cool, dry Neurologic: Grossly normal Psych: Mildly anxious  IMPRESSION: Principal Problem:   Chest pain Active Problems:   Hyperlipidemia   Hypertension   Thyroid disease   Osteoporosis   Sleep apnea   Atrial fibrillation, new onset   PLAN: 1. Chest pain-there is chest pain which was both responsive to nitroglycerin and clearly reproducible to palpation. Currently the significant bilateral anterior chest pain resolved with nitroglycerin, however the reproducible left apical and axillary chest wall pain persists. I would recommend morphine or similar narcotics for this pain. She will be heparinized and will continue to follow troponins to evaluate for acute coronary syndrome. Her d-dimer is elevated, which may be consistent with age, however it would be reasonable to rule out a pulmonary embolus. I discussed with the emergency attending physician and we will order a CT pulmonary angiogram. This will also allow [place] to assess for any chest wall abnormalities or possible thoracic vertebral compression fracture. We will keep her nothing by  mouth after midnight for possible stress testing or cardiac catheterization tomorrow. 2. Atrial fibrillation with controlled ventricular response-she has newly recognized atrial fibrillation with controlled ventricular response. She seems to be unaware of this. It's unclear whether this is new and related to possible ischemia or whatever event caused her chest pain. She will be heparinized. Her CHADSVASC score is 4, therefore anticoagulation would be recommended once a clear diagnosis has been made. 3. Dyslipidemia-not currently on a statin. Will check a fasting lipid profile in the  morning. 4. Hypertension-blood pressure appears well controlled. Continue current antihypertensives. 5. Hypothyroidism-continue levothyroxine and check TSH and free T4 in the morning 6. Obstructive sleep apnea-CPAP per respiratory 7. Osteoporosis - concerned that her left chest wall radicular pain may be due to thoracic vertebral compression fracture. This will be assessed with CT angiography, no clear evidence of fracture on her lateral chest x-ray. We'll continue medications for osteoporosis. Continue steroids for now, however rationale for ongoing steroid use is unclear - especially given osteoporosis? 8. Full code  Pixie Casino, MD, [facility] Attending Cardiologist [facility]  Romelo Sciandra C 02/17/2015, 1:41 PM

## 2015-02-17 NOTE — ED Notes (Addendum)
D-dimer redraw per lab, sample hemolysed.

## 2015-02-17 NOTE — Progress Notes (Signed)
ANTICOAGULATION CONSULT NOTE - Initial Consult  Pharmacy Consult for Heparin  Indication: atrial fibrillation  Allergies  Allergen Reactions  . Fosamax [Alendronate Sodium]     Patient Measurements:   Heparin Dosing Weight: 65  Vital Signs: Temp: 97.4 F (36.3 C) (02/19 0854) Temp Source: Oral (02/19 0854) BP: 104/77 mmHg (02/19 1215) Pulse Rate: 89 (02/19 1215)  Labs:  Recent Labs  02/17/15 0900 02/17/15 0908 02/17/15 0923  HGB 16.1* 16.7*  --   HCT 46.5* 49.0*  --   PLT 213  --   --   CREATININE  --  1.10 1.17*    CrCl cannot be calculated (Unknown ideal weight.).   Medical History: Past Medical History  Diagnosis Date  . Hyperlipidemia   . Hypertension   . Hypothyroid   . Sleep apnea 04/2012    Mild/ AHI 10/CPAP 7cm h2o w/2 L o2  . Osteoporosis     Medications:   (Not in a hospital admission)  Assessment: 77 YOF who presented with chest pain. Pharmacy consulted to start heparin for Afib which seems to be a new diagnosis. D-dimer elevated at 0.76. Patient was not on any anticoagulation prior to arrival. H/H mildly elevated and plt wnl.   Goal of Therapy:  Heparin level 0.3-0.7 units/ml Monitor platelets by anticoagulation protocol: Yes   Plan:  -Heparin 4000 units bolus, then heparin infusion at 900 units/hr  -F/u 8 hr HL -Monitor CBC, daily HL and s/s of bleeding   Albertina Parr, PharmD., BCPS Clinical Pharmacist Pager [phone]

## 2015-02-17 NOTE — Progress Notes (Signed)
CRITICAL VALUE ALERT  Critical value received:  troponin  Date of notification:  02/17/2015  Time of notification: 21:44  Critical value read back: yes  Nurse who received alert:  Zachary George RN  MD notified (1st page):  Dr. Oleta Mouse  Time of first page: 2230  MD notified (2nd page):  Time of second page:  Responding MD:  Dr. Oleta Mouse  Time MD responded:  2235

## 2015-02-17 NOTE — Significant Event (Signed)
Nurse reported troponin 11, completely asymptomatic. On Heparin gtt, ASA, Statin started. NPO   Plan to cath in AM.  Manus Gunning, MD

## 2015-02-17 NOTE — ED Notes (Signed)
Patient transported to CT 

## 2015-02-17 NOTE — ED Provider Notes (Signed)
CSN: [id number]     Arrival date & time 02/17/15  0626 History   First MD Initiated Contact with Patient 02/17/15 [phone]     Chief Complaint  Patient presents with  . Chest Pain     (Consider location/radiation/quality/duration/timing/severity/associated sxs/prior Treatment) Patient is a 81 y.o. female presenting with chest pain. The history is provided by the patient.  Chest Pain Pain location:  L chest Associated symptoms: nausea and vomiting   Associated symptoms: no abdominal pain, no back pain, no headache, no numbness, no shortness of breath and no weakness    patient developed sharp left sided chest pain earlier today. Somewhat worse with breathing. States began after she took a pill. She had some nausea and vomiting. No diarrhea. She's not had pains like this before. States she has not had a heart attack.. No cough. Nose contacts. No swelling or legs. No diaphoresis. States she feels somewhat better now but is still hurting.  Past Medical History  Diagnosis Date  . Hyperlipidemia   . Hypertension   . Hypothyroid   . Sleep apnea 04/2012    Mild/ AHI 10/CPAP 7cm h2o w/2 L o2  . Osteoporosis    Past Surgical History  Procedure Laterality Date  . Left heart catheterization with coronary angiogram N/A 02/20/2015    Procedure: LEFT HEART CATHETERIZATION WITH CORONARY ANGIOGRAM;  Surgeon: Jolaine Artist, MD;  Location: [facility] CATH LAB;  Service: Cardiovascular;  Laterality: N/A;  . Percutaneous coronary stent intervention (pci-s)  02/20/2015    Procedure: PERCUTANEOUS CORONARY STENT INTERVENTION (PCI-S);  Surgeon: Jolaine Artist, MD;  Location: [facility] CATH LAB;  Service: Cardiovascular;;  OM1  (2.75/68mm Rebel)   Family History  Problem Relation Age of Onset  . Coronary artery disease Father   . Diabetes Father   . Stroke Mother   . Arthritis Mother    History  Substance Use Topics  . Smoking status: Never Smoker   . Smokeless tobacco: Not on file  . Alcohol Use: Not on file    OB History    No data available     Review of Systems  Constitutional: Negative for activity change and appetite change.  Eyes: Negative for pain.  Respiratory: Negative for chest tightness and shortness of breath.   Cardiovascular: Positive for chest pain. Negative for leg swelling.  Gastrointestinal: Positive for nausea and vomiting. Negative for abdominal pain and diarrhea.  Genitourinary: Negative for flank pain.  Musculoskeletal: Negative for back pain and neck stiffness.  Skin: Negative for rash.  Neurological: Negative for weakness, numbness and headaches.  Psychiatric/Behavioral: Negative for behavioral problems.      Allergies  Fosamax  Home Medications   Prior to Admission medications   Medication Sig Start Date End Date Taking? Authorizing Provider  ALPRAZolam (XANAX) 0.25 MG tablet Take 0.25 mg by mouth at bedtime.   Yes Historical Provider, MD  amLODipine (NORVASC) 10 MG tablet Take 10 mg by mouth daily.   Yes Historical Provider, MD  calcium carbonate 1250 MG capsule Take 1,250 mg by mouth 2 (two) times daily with a meal.   Yes Historical Provider, MD  cholecalciferol (VITAMIN D) 1000 UNITS tablet Take 1,000 Units by mouth daily.   Yes Historical Provider, MD  cyanocobalamin 1000 MCG tablet Take 100 mcg by mouth daily.   Yes Historical Provider, MD  HYDROcodone-acetaminophen (NORCO/VICODIN) 5-325 MG per tablet Take 1 tablet by mouth every 6 (six) hours as needed for moderate pain.   Yes Historical Provider, MD  levothyroxine (SYNTHROID,  LEVOTHROID) 137 MCG tablet Take 1 tablet (137 mcg total) by mouth daily. 02/06/15  Yes [name], MD  meloxicam (MOBIC) 7.5 MG tablet TAKE 2 TABLETS (15 MG TOTAL) BY MOUTH DAILY. Patient taking differently: Take 15 mg by mouth daily.  02/06/15  Yes [name], MD  metoprolol (LOPRESSOR) 50 MG tablet Take 25 mg by mouth daily.   Yes Historical Provider, MD  omeprazole (PRILOSEC) 20 MG capsule Take 20 mg by mouth daily.    Yes Historical Provider, MD  predniSONE (DELTASONE) 1 MG tablet Take 3 mg by mouth daily with breakfast.   Yes Historical Provider, MD  raloxifene (EVISTA) 60 MG tablet Take 1 tablet (60 mg total) by mouth daily. 02/06/15  Yes [name], MD  traMADol (ULTRAM) 50 MG tablet Take 50 mg by mouth every 6 (six) hours as needed for moderate pain.   Yes Historical Provider, MD  triamterene-hydrochlorothiazide (MAXZIDE-25) 37.5-25 MG per tablet Take 1 tablet by mouth daily.   Yes Historical Provider, MD   BP 128/64 mmHg  Pulse 83  Temp(Src) 97.7 F (36.5 C) (Oral)  Resp 17  Ht 5\' 2"  (1.575 m)  Wt 175 lb 4.3 oz (79.5 kg)  BMI 32.05 kg/m2  SpO2 92% Physical Exam  Constitutional: She is oriented to person, place, and time. She appears well-developed and well-nourished.  HENT:  Head: Normocephalic and atraumatic.  Eyes: EOM are normal. Pupils are equal, round, and reactive to light.  Neck: Normal range of motion. Neck supple.  Cardiovascular: Normal rate and normal heart sounds.   No murmur heard. Mildly irregular rhythm.  Pulmonary/Chest: Effort normal and breath sounds normal. No respiratory distress. She exhibits tenderness.  Some tenderness to left mid anterior chest. There is a rash below her left breast, however it is nontender. It is slightly erythematous and round and that her 2-3 areas of it.  Abdominal: Soft. Bowel sounds are normal. She exhibits no distension. There is no tenderness. There is no rebound and no guarding.  Musculoskeletal: Normal range of motion.  Neurological: She is alert and oriented to person, place, and time. No cranial nerve deficit.  Skin: Skin is warm and dry.  Psychiatric: She has a normal mood and affect. Her speech is normal.  Nursing note and vitals reviewed.   ED Course  Procedures (including critical care time) Labs Review Labs Reviewed  CBC WITH DIFFERENTIAL/PLATELET - Abnormal; Notable for the following:    RBC 5.45 (*)    Hemoglobin 16.1 (*)     HCT 46.5 (*)    All other components within normal limits  BASIC METABOLIC PANEL - Abnormal; Notable for the following:    Glucose, Bld 109 (*)    Creatinine, Ser 1.17 (*)    GFR calc non Af Amer 43 (*)    GFR calc Af Amer 50 (*)    All other components within normal limits  D-DIMER, QUANTITATIVE - Abnormal; Notable for the following:    D-Dimer, Quant 0.76 (*)    All other components within normal limits  CBC - Abnormal; Notable for the following:    RBC 5.13 (*)    Hemoglobin 15.2 (*)    All other components within normal limits  BRAIN NATRIURETIC PEPTIDE - Abnormal; Notable for the following:    B Natriuretic Peptide 286.3 (*)    All other components within normal limits  BASIC METABOLIC PANEL - Abnormal; Notable for the following:    Creatinine, Ser 1.14 (*)    GFR calc  non Af Amer 45 (*)    GFR calc Af Amer 52 (*)    All other components within normal limits  LIPID PANEL - Abnormal; Notable for the following:    HDL 35 (*)    LDL Cholesterol 104 (*)    All other components within normal limits  TROPONIN I - Abnormal; Notable for the following:    Troponin I 11.77 (*)    All other components within normal limits  TROPONIN I - Abnormal; Notable for the following:    Troponin I 13.43 (*)    All other components within normal limits  TROPONIN I - Abnormal; Notable for the following:    Troponin I 10.92 (*)    All other components within normal limits  HEPARIN LEVEL (UNFRACTIONATED) - Abnormal; Notable for the following:    Heparin Unfractionated 0.23 (*)    All other components within normal limits  BASIC METABOLIC PANEL - Abnormal; Notable for the following:    Glucose, Bld 111 (*)    Creatinine, Ser 1.12 (*)    GFR calc non Af Amer 45 (*)    GFR calc Af Amer 53 (*)    All other components within normal limits  BASIC METABOLIC PANEL - Abnormal; Notable for the following:    Creatinine, Ser 1.17 (*)    GFR calc non Af Amer 43 (*)    GFR calc Af Amer 50 (*)    All  other components within normal limits  I-STAT CHEM 8, ED - Abnormal; Notable for the following:    Potassium 5.5 (*)    BUN 26 (*)    Glucose, Bld 109 (*)    Calcium, Ion 0.99 (*)    Hemoglobin 16.7 (*)    HCT 49.0 (*)    All other components within normal limits  HEPARIN LEVEL (UNFRACTIONATED)  TSH  T4, FREE  MAGNESIUM  HEPARIN LEVEL (UNFRACTIONATED)  HEPARIN LEVEL (UNFRACTIONATED)  CBC  HEPARIN LEVEL (UNFRACTIONATED)  CBC  PROTIME-INR  CBC  BASIC METABOLIC PANEL  I-STAT TROPOININ, ED  POCT ACTIVATED CLOTTING TIME    Imaging Review No results found.   EKG Interpretation   Date/Time:  Friday February 17 2015 12:23:49 EST Ventricular Rate:  85 PR Interval:    QRS Duration: 81 QT Interval:  364 QTC Calculation: 433 R Axis:   30 Text Interpretation:  Atrial fibrillation Low voltage, extremity and  precordial leads Minimal ST depression, inferior leads ED PHYSICIAN  INTERPRETATION AVAILABLE IN [facility] Confirmed by TEST, Record  ([id number]) on 02/20/2015 6:51:28 AM      MDM   Final diagnoses:  Atrial fibrillation, new onset  Ischemic chest pain    Patient with chest pain. Somewhat worrisome story. Also found new onset A. fib. D-dimer ordered for pleuritic component of the pain. Somewhat better after nitroglycerin initially. Had some mild ST depression. Could possibly age-adjust d-dimer but cardiology recommend CT. Initial troponin negative. Heparin started.  CRITICAL CARE Performed by: Mackie Pai Total critical care time: 30 Critical care time was exclusive of separately billable procedures and treating other patients. Critical care was necessary to treat or prevent imminent or life-threatening deterioration. Critical care was time spent personally by me on the following activities: development of treatment plan with patient and/or surrogate as well as nursing, discussions with consultants, evaluation of patient's response to treatment, examination of  patient, obtaining history from patient or surrogate, ordering and performing treatments and interventions, ordering and review of laboratory studies, ordering and review of radiographic studies,  pulse oximetry and re-evaluation of patient's condition.     Jasper Riling. Alvino Chapel, MD 02/21/15 0111

## 2015-02-17 NOTE — Progress Notes (Signed)
Spoke with pt about options for wearing CPAP, but pt declined, stating that she is unable to tolerate the device/mask.

## 2015-02-17 NOTE — ED Notes (Signed)
Pt c/o left sided CP this am; pt tachyonic at present

## 2015-02-17 NOTE — Progress Notes (Signed)
ANTICOAGULATION CONSULT NOTE - [place] for Heparin  Indication: atrial fibrillation  Allergies  Allergen Reactions  . Fosamax [Alendronate Sodium]     Patient Measurements: Weight: 178 lb 11.2 oz (81.058 kg) Heparin Dosing Weight: 65  Vital Signs: Temp: 98 F (36.7 C) (02/19 2045) Temp Source: Oral (02/19 2045) BP: 114/60 mmHg (02/19 2045) Pulse Rate: 80 (02/19 2045)  Labs:  Recent Labs  02/17/15 0900 02/17/15 0908 02/17/15 0923 02/17/15 2015 02/17/15 2132  HGB 16.1* 16.7*  --   --   --   HCT 46.5* 49.0*  --   --   --   PLT 213  --   --   --   --   HEPARINUNFRC  --   --   --   --  0.31  CREATININE  --  1.10 1.17*  --   --   TROPONINI  --   --   --  11.77*  --     Estimated Creatinine Clearance: 30.8 mL/min (by C-G formula based on Cr of 1.17).   Medical History: Past Medical History  Diagnosis Date  . Hyperlipidemia   . Hypertension   . Hypothyroid   . Sleep apnea 04/2012    Mild/ AHI 10/CPAP 7cm h2o w/2 L o2  . Osteoporosis     Medications:  Prescriptions prior to admission  Medication Sig Dispense Refill Last Dose  . ALPRAZolam (XANAX) 0.25 MG tablet Take 0.25 mg by mouth at bedtime.   02/15/2015  . amLODipine (NORVASC) 10 MG tablet Take 10 mg by mouth daily.   02/16/2015 at Unknown time  . calcium carbonate 1250 MG capsule Take 1,250 mg by mouth 2 (two) times daily with a meal.   02/16/2015 at Unknown time  . cholecalciferol (VITAMIN D) 1000 UNITS tablet Take 1,000 Units by mouth daily.   02/16/2015 at Unknown time  . cyanocobalamin 1000 MCG tablet Take 100 mcg by mouth daily.   02/16/2015 at Unknown time  . HYDROcodone-acetaminophen (NORCO/VICODIN) 5-325 MG per tablet Take 1 tablet by mouth every 6 (six) hours as needed for moderate pain.   02/15/2015 at Unknown time  . levothyroxine (SYNTHROID, LEVOTHROID) 137 MCG tablet Take 1 tablet (137 mcg total) by mouth daily. 90 tablet 3 02/17/2015 at Unknown time  . meloxicam (MOBIC) 7.5  MG tablet TAKE 2 TABLETS (15 MG TOTAL) BY MOUTH DAILY. (Patient taking differently: Take 15 mg by mouth daily. ) 180 tablet 3 02/16/2015 at Unknown time  . metoprolol (LOPRESSOR) 50 MG tablet Take 25 mg by mouth daily.   02/16/2015 at 0730  . omeprazole (PRILOSEC) 20 MG capsule Take 20 mg by mouth daily.   02/16/2015 at Unknown time  . predniSONE (DELTASONE) 1 MG tablet Take 3 mg by mouth daily with breakfast.   02/16/2015 at Unknown time  . raloxifene (EVISTA) 60 MG tablet Take 1 tablet (60 mg total) by mouth daily. 90 tablet 3 02/16/2015 at Unknown time  . traMADol (ULTRAM) 50 MG tablet Take 50 mg by mouth every 6 (six) hours as needed for moderate pain.   02/16/2015 at Unknown time  . triamterene-hydrochlorothiazide (MAXZIDE-25) 37.5-25 MG per tablet Take 1 tablet by mouth daily.   02/16/2015 at Unknown time    Assessment: 84 YOF who presented with chest pain. Pharmacy consulted to start heparin for Afib which seems to be a new diagnosis. D-dimer elevated at 0.76. Patient was not on any anticoagulation prior to arrival. H/H mildly elevated and plt wnl.  Initial HL is therapeutic at 0.31 on heparin 900 units/hr. No bleeding is noted.  Goal of Therapy:  Heparin level 0.3-0.7 units/ml Monitor platelets by anticoagulation protocol: Yes   Plan:  -Continue heparin infusion at 900 units/hr  -F/u 8 hr HL -Monitor CBC, daily HL and s/s of bleeding  -F/u on long-term anticoag  Abbie Jablon M. Diona Foley, PharmD Clinical Pharmacist Pager [phone]

## 2015-02-18 DIAGNOSIS — E039 Hypothyroidism, unspecified: Secondary | ICD-10-CM

## 2015-02-18 DIAGNOSIS — I214 Non-ST elevation (NSTEMI) myocardial infarction: Principal | ICD-10-CM

## 2015-02-18 DIAGNOSIS — I209 Angina pectoris, unspecified: Secondary | ICD-10-CM

## 2015-02-18 LAB — MAGNESIUM: Magnesium: 1.7 mg/dL (ref 1.5–2.5)

## 2015-02-18 LAB — LIPID PANEL
Cholesterol: 167 mg/dL (ref 0–200)
HDL: 35 mg/dL — ABNORMAL LOW (ref >39)
LDL CALC: 104 mg/dL — AB (ref 0–99)
Total CHOL/HDL Ratio: 4.8 RATIO
Triglycerides: 142 mg/dL (ref <150)
VLDL: 28 mg/dL (ref 0–40)

## 2015-02-18 LAB — CBC
HCT: 43.7 % (ref 36.0–46.0)
HEMOGLOBIN: 15.2 g/dL — AB (ref 12.0–15.0)
MCH: 29.6 pg (ref 26.0–34.0)
MCHC: 34.8 g/dL (ref 30.0–36.0)
MCV: 85.2 fL (ref 78.0–100.0)
Platelets: 256 10*3/uL (ref 150–400)
RBC: 5.13 MIL/uL — ABNORMAL HIGH (ref 3.87–5.11)
RDW: 13.3 % (ref 11.5–15.5)
WBC: 6.3 10*3/uL (ref 4.0–10.5)

## 2015-02-18 LAB — BASIC METABOLIC PANEL
Anion gap: 8 (ref 5–15)
BUN: 14 mg/dL (ref 6–23)
CALCIUM: 8.9 mg/dL (ref 8.4–10.5)
CO2: 24 mmol/L (ref 19–32)
Chloride: 108 mmol/L (ref 96–112)
Creatinine, Ser: 1.14 mg/dL — ABNORMAL HIGH (ref 0.50–1.10)
GFR calc Af Amer: 52 mL/min — ABNORMAL LOW (ref >90)
GFR, EST NON AFRICAN AMERICAN: 45 mL/min — AB (ref >90)
Glucose, Bld: 96 mg/dL (ref 70–99)
Potassium: 3.8 mmol/L (ref 3.5–5.1)
SODIUM: 140 mmol/L (ref 135–145)

## 2015-02-18 LAB — TROPONIN I
Troponin I: 13.43 ng/mL (ref <0.031)
Troponin I: 10.92 ng/mL (ref <0.031)

## 2015-02-18 LAB — HEPARIN LEVEL (UNFRACTIONATED)
HEPARIN UNFRACTIONATED: 0.39 [IU]/mL (ref 0.30–0.70)
Heparin Unfractionated: 0.23 IU/mL — ABNORMAL LOW (ref 0.30–0.70)

## 2015-02-18 LAB — BRAIN NATRIURETIC PEPTIDE: B Natriuretic Peptide: 286.3 pg/mL — ABNORMAL HIGH (ref 0.0–100.0)

## 2015-02-18 LAB — TSH: TSH: 1.97 u[IU]/mL (ref 0.350–4.500)

## 2015-02-18 LAB — T4, FREE: Free T4: 1.72 ng/dL (ref 0.80–1.80)

## 2015-02-18 NOTE — Progress Notes (Signed)
SUBJECTIVE: Pt feels well this morning. Denies chest pain, palpitations, and shortness of breath. Last episode of chest pain was yesterday, relieved with SL nitroglycerin.     Intake/Output Summary (Last 24 hours) at 02/18/15 1150 Last data filed at 02/18/15 1100  Gross per 24 hour  Intake    614 ml  Output   1000 ml  Net   -386 ml    Current Facility-Administered Medications  Medication Dose Route Frequency Provider Last Rate Last Dose  . 0.9 %  sodium chloride infusion  250 mL Intravenous PRN Pixie Casino, MD      . ALPRAZolam Duanne Moron) tablet 0.25 mg  0.25 mg Oral QHS Pixie Casino, MD   0.25 mg at 02/17/15 2121  . amLODipine (NORVASC) tablet 10 mg  10 mg Oral Daily Pixie Casino, MD   10 mg at 02/18/15 0272  . aspirin EC tablet 81 mg  81 mg Oral Daily Pixie Casino, MD   81 mg at 02/18/15 5366  . atorvastatin (LIPITOR) tablet 80 mg  80 mg Oral q1800 Manus Gunning, MD      . calcium carbonate (OS-CAL - dosed in mg of elemental calcium) tablet 1,250 mg  1,250 mg Oral BID WC Pixie Casino, MD   1,250 mg at 02/18/15 0753  . cholecalciferol (VITAMIN D) tablet 1,000 Units  1,000 Units Oral Daily Pixie Casino, MD   1,000 Units at 02/18/15 0939  . heparin ADULT infusion 100 units/mL (25000 units/250 mL)  900 Units/hr Intravenous Continuous Darnell Level Mancheril, RPH 9 mL/hr at 02/17/15 1332 900 Units/hr at 02/17/15 1332  . HYDROcodone-acetaminophen (NORCO/VICODIN) 5-325 MG per tablet 1 tablet  1 tablet Oral Q6H PRN Pixie Casino, MD   1 tablet at 02/17/15 1455  . levothyroxine (SYNTHROID, LEVOTHROID) tablet 137 mcg  137 mcg Oral QAC breakfast Pixie Casino, MD   137 mcg at 02/18/15 0754  . metoprolol tartrate (LOPRESSOR) tablet 25 mg  25 mg Oral Daily Pixie Casino, MD   25 mg at 02/18/15 0939  . nitroGLYCERIN (NITROSTAT) SL tablet 0.4 mg  0.4 mg Sublingual Q5 Min x 3 PRN Pixie Casino, MD      . ondansetron [facility]) injection 4 mg  4 mg Intravenous Q6H PRN  Pixie Casino, MD      . pantoprazole (PROTONIX) EC tablet 40 mg  40 mg Oral Daily Pixie Casino, MD   40 mg at 02/18/15 4403  . predniSONE (DELTASONE) tablet 3 mg  3 mg Oral Q breakfast Pixie Casino, MD   3 mg at 02/18/15 4742  . raloxifene (EVISTA) tablet 60 mg  60 mg Oral Daily Pixie Casino, MD   60 mg at 02/18/15 5956  . sodium chloride 0.9 % injection 3 mL  3 mL Intravenous Q12H Pixie Casino, MD   3 mL at 02/17/15 2121  . sodium chloride 0.9 % injection 3 mL  3 mL Intravenous PRN Pixie Casino, MD      . traMADol Veatrice Bourbon) tablet 50 mg  50 mg Oral Q6H PRN Pixie Casino, MD      . triamterene-hydrochlorothiazide (MAXZIDE-25) 37.5-25 MG per tablet 1 tablet  1 tablet Oral Daily Pixie Casino, MD   1 tablet at 02/18/15 0940  . vitamin B-12 (CYANOCOBALAMIN) tablet 100 mcg  100 mcg Oral Daily Pixie Casino, MD   100 mcg at 02/18/15 3875    Filed Vitals:  02/17/15 2200 02/18/15 0005 02/18/15 0400 02/18/15 0739  BP:  123/56 88/71 128/66  Pulse:  67 92   Temp:  97.4 F (36.3 C) 98 F (36.7 C) 98.1 F (36.7 C)  TempSrc:  Oral Oral Oral  Resp:  18 18   Height: 5\' 2"  (1.575 m)     Weight:   178 lb 4.8 oz (80.876 kg)   SpO2:  91% 98%     PHYSICAL EXAM General: NAD HEENT: Normal. Neck: No JVD, no thyromegaly.  Lungs: Clear to auscultation bilaterally with normal respiratory effort. CV: Nondisplaced PMI.  Irregular rhythm, normal S1/S2, no S3, soft 1/6 ejection systolic murmur over RUSB, 2/6 holosystolic murmur along left sternal border.  No pretibial edema.   Abdomen: Soft, obese, no distention.  Neurologic: Alert and oriented x 3.  Psych: Normal affect. Musculoskeletal: No gross deformities. Extremities: No clubbing or cyanosis.   TELEMETRY: Reviewed telemetry pt in controlled atrial fibrillation.  LABS: Basic Metabolic Panel:  Recent Labs  02/17/15 0923 02/18/15 0127  NA 139 140  K 3.5 3.8  CL 106 108  CO2 23 24  GLUCOSE 109* 96  BUN 17 14    CREATININE 1.17* 1.14*  CALCIUM 9.3 8.9  MG  --  1.7   Liver Function Tests: No results for input(s): AST, ALT, ALKPHOS, BILITOT, PROT, ALBUMIN in the last 72 hours. No results for input(s): LIPASE, AMYLASE in the last 72 hours. CBC:  Recent Labs  02/17/15 0900 02/17/15 0908 02/18/15 0127  WBC 6.7  --  6.3  NEUTROABS 4.4  --   --   HGB 16.1* 16.7* 15.2*  HCT 46.5* 49.0* 43.7  MCV 85.3  --  85.2  PLT 213  --  256   Cardiac Enzymes:  Recent Labs  02/17/15 2015 02/18/15 0127 02/18/15 0750  TROPONINI 11.77* 13.43* 10.92*   BNP: Invalid input(s): POCBNP D-Dimer:  Recent Labs  02/17/15 1000  DDIMER 0.76*   Hemoglobin A1C: No results for input(s): HGBA1C in the last 72 hours. Fasting Lipid Panel:  Recent Labs  02/18/15 0127  CHOL 167  HDL 35*  LDLCALC 104*  TRIG 142  CHOLHDL 4.8   Thyroid Function Tests:  Recent Labs  02/18/15 0750  TSH 1.970   Anemia Panel: No results for input(s): VITAMINB12, FOLATE, FERRITIN, TIBC, IRON, RETICCTPCT in the last 72 hours.  RADIOLOGY: Dg Chest 2 View  02/17/2015   CLINICAL DATA:  One day history of chest pain  EXAM: CHEST  2 VIEW  COMPARISON:  April 27, 2012  FINDINGS: There is no edema or consolidation. There is mild atelectatic change in the right middle lobe anteriorly. Heart is enlarged with pulmonary vascularity within normal limits. No adenopathy. There is degenerative change in the thoracic spine.  IMPRESSION: Atelectatic change right middle lobe anteriorly. No edema or consolidation.   Electronically Signed   By: Lowella Grip III M.D.   On: 02/17/2015 09:15   Ct Angio Chest Pe W/cm &/or Wo Cm  02/17/2015   CLINICAL DATA:  Acute chest pain today  EXAM: CT ANGIOGRAPHY CHEST WITH CONTRAST  TECHNIQUE: Multidetector CT imaging of the chest was performed using the standard protocol during bolus administration of intravenous contrast. Multiplanar CT image reconstructions and MIPs were obtained to evaluate the  vascular anatomy.  CONTRAST:  30mL OMNIPAQUE IOHEXOL 350 MG/ML SOLN  COMPARISON:  None.  FINDINGS: The lungs are well aerated bilaterally. No focal infiltrate or sizable effusion is seen. The thoracic inlet is within normal limits.  The thoracic aorta shows mild calcification without aneurysmal dilatation. Pulmonary artery is well visualized and demonstrates no evidence of pulmonary emboli. The small peripheral branches in the lower lobes bilaterally are incompletely evaluated due to patient motion artifact. No sizable hilar or mediastinal adenopathy is seen. Scattered Coronary calcifications are noted.  Scanning into the upper abdomen reveals cholelithiasis. No complicating factors are seen. The osseous structures show degenerative change of the thoracic spine. Calcified lesion is noted in the left breast. This may represent a cyst or previous hematoma  Review of the MIP images confirms the above findings.  IMPRESSION: No evidence of pulmonary emboli.  No acute abnormality is seen.   Electronically Signed   By: Inez Catalina M.D.   On: 02/17/2015 14:39      ASSESSMENT AND PLAN: 1. NSTEMI: Troponins peaked at 13.43, down to 10.92. Denies chest pain. On heparin, ASA, beta blocker, and statin. Will plan for cardiac catheterization on 2/22 unless she were to develop dynamic ECG changes or develop chest pain unable to be controlled with medications, or should she become hemodynamically unstable. Will obtain echocardiogram to assess LV systolic function and regional wall motion, as well as LA size given atrial fibrillation.  2. Atrial fibrillation: New-onset, rate controlled on low-dose metoprolol. Will need long-term anticoagulation given CHADSVASC score of 5. Currently on heparin.  3. Dyslipidemia with elevated LDL: Now on high intensity statin therapy.  4. OSA: Does not like to use CPAP as she feels suffocated by mask. Likely contributing to atrial fibrillation.  5. Essential HTN: Normal today. No changes  to therapy. Given NSTEMI, would eventually consider switching Maxzide to ACEI.  6. Hypothyroidism: TSH normal. Continue present dose of levothyroxine.    Kate Sable, M.D., F.A.C.C.

## 2015-02-18 NOTE — Progress Notes (Signed)
Pt stated she did not want to wear CPAP for the evening, RT informed pt to call for RT if changed her mind.

## 2015-02-18 NOTE — Progress Notes (Addendum)
ANTICOAGULATION CONSULT NOTE - [place] for Heparin  Indication: atrial fibrillation  Allergies  Allergen Reactions  . Fosamax [Alendronate Sodium]     Patient Measurements: Height: 5\' 2"  (157.5 cm) Weight: 178 lb 4.8 oz (80.876 kg) IBW/kg (Calculated) : 50.1 Heparin Dosing Weight: 65  Vital Signs: Temp: 98.1 F (36.7 C) (02/20 0739) Temp Source: Oral (02/20 0739) BP: 128/66 mmHg (02/20 0739) Pulse Rate: 92 (02/20 0400)  Labs:  Recent Labs  02/17/15 0900 02/17/15 0908 02/17/15 0923 02/17/15 2015 02/17/15 2132 02/18/15 0127 02/18/15 0750  HGB 16.1* 16.7*  --   --   --  15.2*  --   HCT 46.5* 49.0*  --   --   --  43.7  --   PLT 213  --   --   --   --  256  --   HEPARINUNFRC  --   --   --   --  0.31  --   --   CREATININE  --  1.10 1.17*  --   --  1.14*  --   TROPONINI  --   --   --  11.77*  --  13.43* 10.92*    Estimated Creatinine Clearance: 39.4 mL/min (by C-G formula based on Cr of 1.14).   Medical History: Past Medical History  Diagnosis Date  . Hyperlipidemia   . Hypertension   . Hypothyroid   . Sleep apnea 04/2012    Mild/ AHI 10/CPAP 7cm h2o w/2 L o2  . Osteoporosis     Medications:  Prescriptions prior to admission  Medication Sig Dispense Refill Last Dose  . ALPRAZolam (XANAX) 0.25 MG tablet Take 0.25 mg by mouth at bedtime.   02/15/2015  . amLODipine (NORVASC) 10 MG tablet Take 10 mg by mouth daily.   02/16/2015 at Unknown time  . calcium carbonate 1250 MG capsule Take 1,250 mg by mouth 2 (two) times daily with a meal.   02/16/2015 at Unknown time  . cholecalciferol (VITAMIN D) 1000 UNITS tablet Take 1,000 Units by mouth daily.   02/16/2015 at Unknown time  . cyanocobalamin 1000 MCG tablet Take 100 mcg by mouth daily.   02/16/2015 at Unknown time  . HYDROcodone-acetaminophen (NORCO/VICODIN) 5-325 MG per tablet Take 1 tablet by mouth every 6 (six) hours as needed for moderate pain.   02/15/2015 at Unknown time  . levothyroxine  (SYNTHROID, LEVOTHROID) 137 MCG tablet Take 1 tablet (137 mcg total) by mouth daily. 90 tablet 3 02/17/2015 at Unknown time  . meloxicam (MOBIC) 7.5 MG tablet TAKE 2 TABLETS (15 MG TOTAL) BY MOUTH DAILY. (Patient taking differently: Take 15 mg by mouth daily. ) 180 tablet 3 02/16/2015 at Unknown time  . metoprolol (LOPRESSOR) 50 MG tablet Take 25 mg by mouth daily.   02/16/2015 at 0730  . omeprazole (PRILOSEC) 20 MG capsule Take 20 mg by mouth daily.   02/16/2015 at Unknown time  . predniSONE (DELTASONE) 1 MG tablet Take 3 mg by mouth daily with breakfast.   02/16/2015 at Unknown time  . raloxifene (EVISTA) 60 MG tablet Take 1 tablet (60 mg total) by mouth daily. 90 tablet 3 02/16/2015 at Unknown time  . traMADol (ULTRAM) 50 MG tablet Take 50 mg by mouth every 6 (six) hours as needed for moderate pain.   02/16/2015 at Unknown time  . triamterene-hydrochlorothiazide (MAXZIDE-25) 37.5-25 MG per tablet Take 1 tablet by mouth daily.   02/16/2015 at Unknown time    Assessment: 41 YOF who presented with  chest pain. Pharmacy consulted to start heparin for Afib.  Coag: Afib, D-dimer elevated at 0.76. Patient was not on any anticoagulation prior to arrival. H/H mildly elevated and plt wnl.   Initial HL is therapeutic at 0.31 on heparin 900 units/hr. CBC wnl; No bleeding is noted.  CV: CP, positive troponin VSS Amlodipine, ASA, atorva, metoprolol, triamterene HCTZ  Endo: hypothyroid Levothyroxine, prednisone, raloxifene  Goal of Therapy:  Heparin level 0.3-0.7 units/ml Monitor platelets by anticoagulation protocol: Yes   Plan:  -Continue heparin infusion at 900 units/hr  -Follow up heparin level at 1300 today. -Monitor CBC, daily HL and s/s of bleeding  -F/u on long-term anticoag  Thank you for allowing pharmacy to be a part of this patients care team.  Rowe Robert Pharm.D., BCPS, AQ-Cardiology Clinical Pharmacist 02/18/2015 11:24 AM Pager: [phone] Phone: [phone]   02/18/2015  1:39 PM Heparin level is noted to be low.  No bleeding noted will increase heparin to 1050 units/hr check 8h heparin level. [place]

## 2015-02-18 NOTE — Progress Notes (Signed)
Pharmacy Note:  Heparin 81 yo lady on heparin for afib.  Heparin level is therapeutic.   Cont heparin at 1050 units/hr.  F/u am labs. Excell Seltzer, PharmD

## 2015-02-18 NOTE — Progress Notes (Signed)
Pt called out c/o chest pain on the left side. Pt received 1 sublingual nitro. Pt stated "that chest pain was a 4 on 0-10 scale" before ntiro was given. After 1 sublingual nitro pt stated "that she was chest pain free." EKG was performed. BP 102/63 HR 80 Oxygen sat 97 on 4L. Will continue to monitor.

## 2015-02-19 DIAGNOSIS — I4891 Unspecified atrial fibrillation: Secondary | ICD-10-CM

## 2015-02-19 LAB — CBC
HEMATOCRIT: 43 % (ref 36.0–46.0)
Hemoglobin: 14.5 g/dL (ref 12.0–15.0)
MCH: 29.2 pg (ref 26.0–34.0)
MCHC: 33.7 g/dL (ref 30.0–36.0)
MCV: 86.5 fL (ref 78.0–100.0)
Platelets: 213 10*3/uL (ref 150–400)
RBC: 4.97 MIL/uL (ref 3.87–5.11)
RDW: 13.6 % (ref 11.5–15.5)
WBC: 6.9 10*3/uL (ref 4.0–10.5)

## 2015-02-19 LAB — BASIC METABOLIC PANEL
ANION GAP: 5 (ref 5–15)
BUN: 14 mg/dL (ref 6–23)
CALCIUM: 8.9 mg/dL (ref 8.4–10.5)
CHLORIDE: 110 mmol/L (ref 96–112)
CO2: 27 mmol/L (ref 19–32)
Creatinine, Ser: 1.12 mg/dL — ABNORMAL HIGH (ref 0.50–1.10)
GFR calc Af Amer: 53 mL/min — ABNORMAL LOW (ref >90)
GFR calc non Af Amer: 45 mL/min — ABNORMAL LOW (ref >90)
GLUCOSE: 111 mg/dL — AB (ref 70–99)
POTASSIUM: 3.9 mmol/L (ref 3.5–5.1)
Sodium: 142 mmol/L (ref 135–145)

## 2015-02-19 LAB — HEPARIN LEVEL (UNFRACTIONATED): Heparin Unfractionated: 0.34 IU/mL (ref 0.30–0.70)

## 2015-02-19 MED ORDER — SODIUM CHLORIDE 0.9 % IV SOLN: 1.0000 mL/kg/h | INTRAVENOUS | Status: DC

## 2015-02-19 MED ORDER — SODIUM CHLORIDE 0.9 % IJ SOLN: 3.0000 mL | INTRAMUSCULAR | Status: DC | PRN

## 2015-02-19 MED ORDER — SODIUM CHLORIDE 0.9 % IV SOLN: 250.0000 mL | INTRAVENOUS | Status: DC | PRN

## 2015-02-19 MED ORDER — ASPIRIN 81 MG PO CHEW
81.0000 mg | CHEWABLE_TABLET | ORAL | Status: AC
  Administered 2015-02-20: 81 mg via ORAL
  Filled 2015-02-19: qty 1

## 2015-02-19 MED ORDER — AMLODIPINE BESYLATE 5 MG PO TABS
5.0000 mg | ORAL_TABLET | Freq: Every day | ORAL | Status: DC
  Administered 2015-02-21: 5 mg via ORAL
  Filled 2015-02-19 (×2): qty 1

## 2015-02-19 MED ORDER — SODIUM CHLORIDE 0.9 % IJ SOLN
3.0000 mL | Freq: Two times a day (BID) | INTRAMUSCULAR | Status: DC
  Administered 2015-02-19: 3 mL via INTRAVENOUS

## 2015-02-19 MED ORDER — METOPROLOL TARTRATE 25 MG PO TABS
25.0000 mg | ORAL_TABLET | Freq: Two times a day (BID) | ORAL | Status: DC
  Administered 2015-02-19 – 2015-02-21 (×4): 25 mg via ORAL
  Filled 2015-02-19 (×4): qty 1

## 2015-02-19 MED ORDER — PERFLUTREN LIPID MICROSPHERE
1.0000 mL | INTRAVENOUS | Status: AC | PRN
  Administered 2015-02-19: 2 mL via INTRAVENOUS
  Filled 2015-02-19: qty 10

## 2015-02-19 MED ORDER — LISINOPRIL 5 MG PO TABS
5.0000 mg | ORAL_TABLET | Freq: Every day | ORAL | Status: DC
  Administered 2015-02-21: 5 mg via ORAL
  Filled 2015-02-19 (×2): qty 1

## 2015-02-19 NOTE — Progress Notes (Signed)
Utilization review completed.  

## 2015-02-19 NOTE — Progress Notes (Signed)
  Echocardiogram 2D Echocardiogram has been performed.  [name] M 02/19/2015, 2:53 PM

## 2015-02-19 NOTE — Progress Notes (Addendum)
SUBJECTIVE: Had episode of chest pain last night relieved with one SL nitro. No chest pain currently. ECG earlier this morning showed controlled atrial fibrillation. Plans for cath on 2/22.     Intake/Output Summary (Last 24 hours) at 02/19/15 1158 Last data filed at 02/19/15 0600  Gross per 24 hour  Intake 541.78 ml  Output    770 ml  Net -228.22 ml    Current Facility-Administered Medications  Medication Dose Route Frequency Provider Last Rate Last Dose  . 0.9 %  sodium chloride infusion  250 mL Intravenous PRN Pixie Casino, MD      . ALPRAZolam Duanne Moron) tablet 0.25 mg  0.25 mg Oral QHS Pixie Casino, MD   0.25 mg at 02/18/15 2116  . amLODipine (NORVASC) tablet 10 mg  10 mg Oral Daily Pixie Casino, MD   10 mg at 02/19/15 0820  . aspirin EC tablet 81 mg  81 mg Oral Daily Pixie Casino, MD   81 mg at 02/19/15 9242  . atorvastatin (LIPITOR) tablet 80 mg  80 mg Oral q1800 Manus Gunning, MD   80 mg at 02/18/15 1726  . calcium carbonate (OS-CAL - dosed in mg of elemental calcium) tablet 1,250 mg  1,250 mg Oral BID WC Pixie Casino, MD   1,250 mg at 02/19/15 0820  . cholecalciferol (VITAMIN D) tablet 1,000 Units  1,000 Units Oral Daily Pixie Casino, MD   1,000 Units at 02/19/15 0819  . heparin ADULT infusion 100 units/mL (25000 units/250 mL)  1,050 Units/hr Intravenous Continuous Pixie Casino, MD 10.5 mL/hr at 02/18/15 1409 1,050 Units/hr at 02/18/15 1409  . HYDROcodone-acetaminophen (NORCO/VICODIN) 5-325 MG per tablet 1 tablet  1 tablet Oral Q6H PRN Pixie Casino, MD   1 tablet at 02/17/15 1455  . levothyroxine (SYNTHROID, LEVOTHROID) tablet 137 mcg  137 mcg Oral QAC breakfast Pixie Casino, MD   137 mcg at 02/19/15 [phone]  . metoprolol tartrate (LOPRESSOR) tablet 25 mg  25 mg Oral Daily Pixie Casino, MD   25 mg at 02/19/15 0820  . nitroGLYCERIN (NITROSTAT) SL tablet 0.4 mg  0.4 mg Sublingual Q5 Min x 3 PRN Pixie Casino, MD   0.4 mg at 02/18/15 1351  .  ondansetron (ZOFRAN) injection 4 mg  4 mg Intravenous Q6H PRN Pixie Casino, MD      . pantoprazole (PROTONIX) EC tablet 40 mg  40 mg Oral Daily Pixie Casino, MD   40 mg at 02/19/15 0820  . predniSONE (DELTASONE) tablet 3 mg  3 mg Oral Q breakfast Pixie Casino, MD   3 mg at 02/18/15 1962  . raloxifene (EVISTA) tablet 60 mg  60 mg Oral Daily Pixie Casino, MD   60 mg at 02/19/15 1057  . sodium chloride 0.9 % injection 3 mL  3 mL Intravenous Q12H Pixie Casino, MD   3 mL at 02/19/15 2297  . sodium chloride 0.9 % injection 3 mL  3 mL Intravenous PRN Pixie Casino, MD      . traMADol Veatrice Bourbon) tablet 50 mg  50 mg Oral Q6H PRN Pixie Casino, MD      . triamterene-hydrochlorothiazide (MAXZIDE-25) 37.5-25 MG per tablet 1 tablet  1 tablet Oral Daily Pixie Casino, MD   1 tablet at 02/19/15 1057  . vitamin B-12 (CYANOCOBALAMIN) tablet 100 mcg  100 mcg Oral Daily Pixie Casino, MD   100 mcg at 02/18/15  [place]:   02/18/15 2010 02/18/15 2357 02/19/15 0400 02/19/15 0409  BP: 120/78   134/70  Pulse: 92   115  Temp:  97.7 F (36.5 C) 98.2 F (36.8 C)   TempSrc:  Oral Oral   Resp: 18   21  Height:      Weight:   175 lb 11.2 oz (79.697 kg)   SpO2: 97%   97%    PHYSICAL EXAM General: NAD HEENT: Normal. Neck: No JVD, no thyromegaly.  Lungs: Clear to auscultation bilaterally with normal respiratory effort. CV: Nondisplaced PMI. Irregular rhythm, normal S1/S2, no S3, soft 1/6 ejection systolic murmur over RUSB, 2/6 holosystolic murmur along left sternal border. No pretibial edema.  Abdomen: Soft, obese, no distention.  Neurologic: Alert and oriented x 3.  Psych: Normal affect. Musculoskeletal: No gross deformities. Extremities: No clubbing or cyanosis.   TELEMETRY: Reviewed telemetry pt in controlled atrial fibrillation with episodes of tachycardia earlier.  LABS: Basic Metabolic Panel:  Recent Labs  02/17/15 0923 02/18/15 0127  NA 139 140  K  3.5 3.8  CL 106 108  CO2 23 24  GLUCOSE 109* 96  BUN 17 14  CREATININE 1.17* 1.14*  CALCIUM 9.3 8.9  MG  --  1.7   Liver Function Tests: No results for input(s): AST, ALT, ALKPHOS, BILITOT, PROT, ALBUMIN in the last 72 hours. No results for input(s): LIPASE, AMYLASE in the last 72 hours. CBC:  Recent Labs  02/17/15 0900  02/18/15 0127 02/19/15 0437  WBC 6.7  --  6.3 6.9  NEUTROABS 4.4  --   --   --   HGB 16.1*  < > 15.2* 14.5  HCT 46.5*  < > 43.7 43.0  MCV 85.3  --  85.2 86.5  PLT 213  --  256 213  < > = values in this interval not displayed. Cardiac Enzymes:  Recent Labs  02/17/15 2015 02/18/15 0127 02/18/15 0750  TROPONINI 11.77* 13.43* 10.92*   BNP: Invalid input(s): POCBNP D-Dimer:  Recent Labs  02/17/15 1000  DDIMER 0.76*   Hemoglobin A1C: No results for input(s): HGBA1C in the last 72 hours. Fasting Lipid Panel:  Recent Labs  02/18/15 0127  CHOL 167  HDL 35*  LDLCALC 104*  TRIG 142  CHOLHDL 4.8   Thyroid Function Tests:  Recent Labs  02/18/15 0750  TSH 1.970   Anemia Panel: No results for input(s): VITAMINB12, FOLATE, FERRITIN, TIBC, IRON, RETICCTPCT in the last 72 hours.  RADIOLOGY: Dg Chest 2 View  02/17/2015   CLINICAL DATA:  One day history of chest pain  EXAM: CHEST  2 VIEW  COMPARISON:  April 27, 2012  FINDINGS: There is no edema or consolidation. There is mild atelectatic change in the right middle lobe anteriorly. Heart is enlarged with pulmonary vascularity within normal limits. No adenopathy. There is degenerative change in the thoracic spine.  IMPRESSION: Atelectatic change right middle lobe anteriorly. No edema or consolidation.   Electronically Signed   By: Lowella Grip III M.D.   On: 02/17/2015 09:15   Ct Angio Chest Pe W/cm &/or Wo Cm  02/17/2015   CLINICAL DATA:  Acute chest pain today  EXAM: CT ANGIOGRAPHY CHEST WITH CONTRAST  TECHNIQUE: Multidetector CT imaging of the chest was performed using the standard protocol  during bolus administration of intravenous contrast. Multiplanar CT image reconstructions and MIPs were obtained to evaluate the vascular anatomy.  CONTRAST:  48mL OMNIPAQUE IOHEXOL 350 MG/ML SOLN  COMPARISON:  None.  FINDINGS: The lungs are well aerated bilaterally. No focal infiltrate or sizable effusion is seen. The thoracic inlet is within normal limits.  The thoracic aorta shows mild calcification without aneurysmal dilatation. Pulmonary artery is well visualized and demonstrates no evidence of pulmonary emboli. The small peripheral branches in the lower lobes bilaterally are incompletely evaluated due to patient motion artifact. No sizable hilar or mediastinal adenopathy is seen. Scattered Coronary calcifications are noted.  Scanning into the upper abdomen reveals cholelithiasis. No complicating factors are seen. The osseous structures show degenerative change of the thoracic spine. Calcified lesion is noted in the left breast. This may represent a cyst or previous hematoma  Review of the MIP images confirms the above findings.  IMPRESSION: No evidence of pulmonary emboli.  No acute abnormality is seen.   Electronically Signed   By: Inez Catalina M.D.   On: 02/17/2015 14:39      ASSESSMENT AND PLAN: 1. NSTEMI: Troponins peaked at 13.43, down to 10.92 on 2/20. Denies chest pain at present. On heparin, ASA, beta blocker, and statin. Will plan for cardiac catheterization on 2/22 unless she were to develop dynamic ECG changes or develop chest pain unable to be controlled with medications, or should she become hemodynamically unstable. Awaiting echocardiogram to assess LV systolic function and regional wall motion, as well as LA size given atrial fibrillation.  2. Atrial fibrillation: New-onset, currently controlled with tachycardia earlier on low-dose metoprolol. I will increase to 25 mg bid. Will need long-term anticoagulation given CHADSVASC score of 5. Currently on heparin.  3. Dyslipidemia with  elevated LDL: Now on high intensity statin therapy.  4. OSA: Does not like to use CPAP as she feels suffocated by mask. Likely contributing to atrial fibrillation.  5. Essential HTN: Normal today. Given NSTEMI, will d/c Maxzide, reduce amlodipine, and switch to ACEI given reduced CV and overall mortality with this class of agents.  6. Hypothyroidism: TSH normal. Continue present dose of levothyroxine.   Kate Sable, M.D., F.A.C.C.

## 2015-02-19 NOTE — Progress Notes (Signed)
ANTICOAGULATION CONSULT NOTE - [place] for Heparin  Indication: atrial fibrillation  Allergies  Allergen Reactions  . Fosamax [Alendronate Sodium]     Patient Measurements: Height: 5\' 2"  (157.5 cm) Weight: 175 lb 11.2 oz (79.697 kg) IBW/kg (Calculated) : 50.1 Heparin Dosing Weight: 65  Vital Signs: Temp: 98.2 F (36.8 C) (02/21 0400) Temp Source: Oral (02/21 0400) BP: 134/70 mmHg (02/21 0409) Pulse Rate: 115 (02/21 0409)  Labs:  Recent Labs  02/17/15 0900 02/17/15 0908 02/17/15 0923 02/17/15 2015  02/18/15 0127 02/18/15 0750 02/18/15 1225 02/18/15 2151 02/19/15 0437  HGB 16.1* 16.7*  --   --   --  15.2*  --   --   --  14.5  HCT 46.5* 49.0*  --   --   --  43.7  --   --   --  43.0  PLT 213  --   --   --   --  256  --   --   --  213  HEPARINUNFRC  --   --   --   --   < >  --   --  0.23* 0.39 0.34  CREATININE  --  1.10 1.17*  --   --  1.14*  --   --   --   --   TROPONINI  --   --   --  11.77*  --  13.43* 10.92*  --   --   --   < > = values in this interval not displayed.  Estimated Creatinine Clearance: 39.1 mL/min (by C-G formula based on Cr of 1.14).   Medical History: Past Medical History  Diagnosis Date  . Hyperlipidemia   . Hypertension   . Hypothyroid   . Sleep apnea 04/2012    Mild/ AHI 10/CPAP 7cm h2o w/2 L o2  . Osteoporosis     Medications:  Prescriptions prior to admission  Medication Sig Dispense Refill Last Dose  . ALPRAZolam (XANAX) 0.25 MG tablet Take 0.25 mg by mouth at bedtime.   02/15/2015  . amLODipine (NORVASC) 10 MG tablet Take 10 mg by mouth daily.   02/16/2015 at Unknown time  . calcium carbonate 1250 MG capsule Take 1,250 mg by mouth 2 (two) times daily with a meal.   02/16/2015 at Unknown time  . cholecalciferol (VITAMIN D) 1000 UNITS tablet Take 1,000 Units by mouth daily.   02/16/2015 at Unknown time  . cyanocobalamin 1000 MCG tablet Take 100 mcg by mouth daily.   02/16/2015 at Unknown time  .  HYDROcodone-acetaminophen (NORCO/VICODIN) 5-325 MG per tablet Take 1 tablet by mouth every 6 (six) hours as needed for moderate pain.   02/15/2015 at Unknown time  . levothyroxine (SYNTHROID, LEVOTHROID) 137 MCG tablet Take 1 tablet (137 mcg total) by mouth daily. 90 tablet 3 02/17/2015 at Unknown time  . meloxicam (MOBIC) 7.5 MG tablet TAKE 2 TABLETS (15 MG TOTAL) BY MOUTH DAILY. (Patient taking differently: Take 15 mg by mouth daily. ) 180 tablet 3 02/16/2015 at Unknown time  . metoprolol (LOPRESSOR) 50 MG tablet Take 25 mg by mouth daily.   02/16/2015 at 0730  . omeprazole (PRILOSEC) 20 MG capsule Take 20 mg by mouth daily.   02/16/2015 at Unknown time  . predniSONE (DELTASONE) 1 MG tablet Take 3 mg by mouth daily with breakfast.   02/16/2015 at Unknown time  . raloxifene (EVISTA) 60 MG tablet Take 1 tablet (60 mg total) by mouth daily. 90 tablet 3 02/16/2015 at Unknown  time  . traMADol (ULTRAM) 50 MG tablet Take 50 mg by mouth every 6 (six) hours as needed for moderate pain.   02/16/2015 at Unknown time  . triamterene-hydrochlorothiazide (MAXZIDE-25) 37.5-25 MG per tablet Take 1 tablet by mouth daily.   02/16/2015 at Unknown time    Assessment: 70 YOF who presented with chest pain. Pharmacy consulted to start heparin for Afib.  Coag: Afib, D-dimer elevated at 0.76. Patient was not on any anticoagulation prior to arrival. H/H mildly elevated and plt wnl.   Initial HL is therapeutic on heparin 900 units/hr. CBC wnl; No bleeding is noted.  CV: CP, positive troponin VSS Amlodipine, ASA, atorva, metoprolol, triamterene HCTZ  Endo: hypothyroid Levothyroxine, prednisone, raloxifene  Goal of Therapy:  Heparin level 0.3-0.7 units/ml Monitor platelets by anticoagulation protocol: Yes   Plan:  -Continue heparin infusion at 900 units/hr  -Monitor CBC, daily HL and s/s of bleeding  -F/u on long-term anticoag  Thank you for allowing pharmacy to be a part of this patients care team.  [name]  Pharm.D., BCPS, AQ-Cardiology Clinical Pharmacist 02/19/2015 8:19 AM Pager: [phone] Phone: [phone]

## 2015-02-19 NOTE — Progress Notes (Signed)
Pt again stated she did not want to wear CPAP for the evening, RT will continue to monitor

## 2015-02-20 ENCOUNTER — Encounter (HOSPITAL_COMMUNITY)
Admission: EM | Disposition: A | Discharge status: Home Health | Payer: Medicare Other | Source: Home / Self Care | Attending: Internal Medicine

## 2015-02-20 DIAGNOSIS — I209 Angina pectoris, unspecified: Secondary | ICD-10-CM | POA: Insufficient documentation

## 2015-02-20 DIAGNOSIS — I2510 Atherosclerotic heart disease of native coronary artery without angina pectoris: Secondary | ICD-10-CM

## 2015-02-20 DIAGNOSIS — I214 Non-ST elevation (NSTEMI) myocardial infarction: Secondary | ICD-10-CM

## 2015-02-20 HISTORY — PX: PERCUTANEOUS CORONARY STENT INTERVENTION (PCI-S): SHX5485

## 2015-02-20 HISTORY — PX: LEFT HEART CATHETERIZATION WITH CORONARY ANGIOGRAM: SHX5451

## 2015-02-20 LAB — CBC
HEMATOCRIT: 41.8 % (ref 36.0–46.0)
Hemoglobin: 14.3 g/dL (ref 12.0–15.0)
MCH: 29.6 pg (ref 26.0–34.0)
MCHC: 34.2 g/dL (ref 30.0–36.0)
MCV: 86.5 fL (ref 78.0–100.0)
PLATELETS: 230 10*3/uL (ref 150–400)
RBC: 4.83 MIL/uL (ref 3.87–5.11)
RDW: 13.4 % (ref 11.5–15.5)
WBC: 7.4 10*3/uL (ref 4.0–10.5)

## 2015-02-20 LAB — BASIC METABOLIC PANEL
ANION GAP: 7 (ref 5–15)
BUN: 17 mg/dL (ref 6–23)
CHLORIDE: 107 mmol/L (ref 96–112)
CO2: 26 mmol/L (ref 19–32)
Calcium: 8.6 mg/dL (ref 8.4–10.5)
Creatinine, Ser: 1.17 mg/dL — ABNORMAL HIGH (ref 0.50–1.10)
GFR, EST AFRICAN AMERICAN: 50 mL/min — AB (ref >90)
GFR, EST NON AFRICAN AMERICAN: 43 mL/min — AB (ref >90)
GLUCOSE: 96 mg/dL (ref 70–99)
Potassium: 3.7 mmol/L (ref 3.5–5.1)
Sodium: 140 mmol/L (ref 135–145)

## 2015-02-20 LAB — POCT ACTIVATED CLOTTING TIME: Activated Clotting Time: 632 seconds

## 2015-02-20 LAB — PROTIME-INR
INR: 1.17 (ref 0.00–1.49)
PROTHROMBIN TIME: 15.1 s (ref 11.6–15.2)

## 2015-02-20 LAB — HEPARIN LEVEL (UNFRACTIONATED): Heparin Unfractionated: 0.31 IU/mL (ref 0.30–0.70)

## 2015-02-20 SURGERY — LEFT HEART CATHETERIZATION WITH CORONARY ANGIOGRAM

## 2015-02-20 MED ORDER — FENTANYL CITRATE 0.05 MG/ML IJ SOLN
INTRAMUSCULAR | Status: AC
  Filled 2015-02-20: qty 2

## 2015-02-20 MED ORDER — CLOPIDOGREL BISULFATE 75 MG PO TABS
75.0000 mg | ORAL_TABLET | Freq: Every day | ORAL | Status: DC
  Administered 2015-02-21: 75 mg via ORAL
  Filled 2015-02-20: qty 1

## 2015-02-20 MED ORDER — BIVALIRUDIN 250 MG IV SOLR
INTRAVENOUS | Status: AC
  Filled 2015-02-20: qty 250

## 2015-02-20 MED ORDER — OFF THE BEAT BOOK
Freq: Once | Status: DC
  Filled 2015-02-20: qty 1

## 2015-02-20 MED ORDER — ASPIRIN 81 MG PO CHEW: 81.0000 mg | CHEWABLE_TABLET | Freq: Every day | ORAL | Status: DC

## 2015-02-20 MED ORDER — LIDOCAINE HCL (PF) 1 % IJ SOLN
INTRAMUSCULAR | Status: AC
  Filled 2015-02-20: qty 30

## 2015-02-20 MED ORDER — MIDAZOLAM HCL 2 MG/2ML IJ SOLN
INTRAMUSCULAR | Status: AC
  Filled 2015-02-20: qty 2

## 2015-02-20 MED ORDER — NITROGLYCERIN 1 MG/10 ML FOR IR/CATH LAB
INTRA_ARTERIAL | Status: AC
  Filled 2015-02-20: qty 10

## 2015-02-20 MED ORDER — CLOPIDOGREL BISULFATE 300 MG PO TABS
ORAL_TABLET | ORAL | Status: AC
  Filled 2015-02-20: qty 2

## 2015-02-20 MED ORDER — MORPHINE SULFATE 2 MG/ML IJ SOLN
2.0000 mg | Freq: Once | INTRAMUSCULAR | Status: AC
  Administered 2015-02-20: 2 mg via INTRAVENOUS
  Filled 2015-02-20: qty 1

## 2015-02-20 MED ORDER — HEPARIN (PORCINE) IN NACL 2-0.9 UNIT/ML-% IJ SOLN
INTRAMUSCULAR | Status: AC
  Filled 2015-02-20: qty 1500

## 2015-02-20 MED ORDER — SODIUM CHLORIDE 0.9 % IV SOLN
1.0000 mL/kg/h | INTRAVENOUS | Status: AC
Start: 2015-02-20 — End: 2015-02-20

## 2015-02-20 NOTE — Interval H&P Note (Signed)
History and Physical Interval Note:  02/20/2015 10:09 AM  [name]  has presented today for surgery, with the diagnosis of cp  The various methods of treatment have been discussed with the patient and family. After consideration of risks, benefits and other options for treatment, the patient has consented to  Procedure(s): LEFT HEART CATHETERIZATION WITH CORONARY ANGIOGRAM (N/A) and possible angioplasty as a surgical intervention .  The patient's history has been reviewed, patient examined, no change in status, stable for surgery.  I have reviewed the patient's chart and labs.  Questions were answered to the patient's satisfaction.     Daniel Bensimhon

## 2015-02-20 NOTE — CV Procedure (Signed)
CARDIAC CATH NOTE  Name: [name] MRN: [id number] DOB: 1933/05/09  Procedure: PTCA and stenting of the first OM  Indication: 81 yo WF presents with a NSTEMI. Diagnostic cath showed occlusion of a large first OM with collaterals.   Procedural Details: The right femoral sheath was exchanged for a 6Fr sheath.  Weight-based bivalirudin was given for anticoagulation. Plavix 600 mg was given orally. Once a therapeutic ACT was achieved, a 6 [place] left Voda 3.5  guide catheter was inserted.  A prowater coronary guidewire was used to cross the lesion.  The lesion was predilated with a 2.5 mm  balloon.  The lesion was then stented with a 2.75 x 16 mm Rebel stent.  The stent was postdilated with a 3.0 mm noncompliant balloon.  Following PCI, there was 0% residual stenosis and TIMI-3 flow. Final angiography confirmed an excellent result. The patient tolerated the procedure well. There were no immediate procedural complications. Femoral hemostasis was achieved with manual compression. The patient was transferred to the post catheterization recovery area for further monitoring.  Lesion Data: Vessel: first Om Percent stenosis (pre): 100% TIMI-flow (pre):  0 Stent:  2.75 x 16 mm Rebel Percent stenosis (post): 0% TIMI-flow (post): 3  Conclusions: successful stenting of the first OM with a BMS.  Recommendations: DAPT for one month then Plavix only. May initiate anticoagulation tomorrow if no bleeding.   Kris Burd [place], MDFACC  02/20/2015, 11:22 AM

## 2015-02-20 NOTE — CV Procedure (Signed)
Cardiac Cath Procedure Note:  Indication: NSTEMI  Procedures performed:  1) Selective coronary angiography 2) Left heart catheterization 3) Left ventriculogram  Description of procedure:   The risks and indication of the procedure were explained. Consent was signed and placed on the chart. An appropriate timeout was taken prior to the procedure. The right groin was prepped and draped in the routine sterile fashion and anesthetized with 1% local lidocaine.   A 5 FR arterial sheath was placed in the right femoral artery using a modified Seldinger technique. Standard catheters including a JL4, JR4 and angled pigtail were used. All catheter exchanges were made over a wire.  Complications:  None apparent  Findings:  Ao Pressure: 127/66 (93) LV Pressure: 130/5/11 There was no signficant gradient across the aortic valve on pullback.  Left main: Normal  LAD: Long vessel wraps apex. Gives off 2 diagonals. 30% proximal stenosis. Mild diffuse plaque throughout  LCX: Nondominant vessel. Gives off 2 OM-s. 30-40% proximal stenosis. 50% stenosis in distal AV-groove LCX. OM-2 occluded proximally.   RCA: Dominant vessel. Mild plaque throughout.   LV-gram done in the RAO projection: Ejection fraction = 55%. No obvious regional wall motion abnormality.   Assessment: 1. NSTEMI with 1-V severe CAD 2. Normal EF 3. New onset AF with CHADSVASc = 5  Plan/Discussion:  She will need PCI of OM-2. Will need to determine if she is candidate for triple therapy for CAD and AF.   Glori Bickers MD 10:33 AM

## 2015-02-20 NOTE — Care Management Note (Signed)
    Page 1 of 1   02/21/2015     11:19:51 AM CARE MANAGEMENT NOTE 02/21/2015  Patient:  MAKEYA, HILGERT   Account Number:  [id number]  Date Initiated:  02/20/2015  Documentation initiated by:  GRAVES-BIGELOW,BRENDA  Subjective/Objective Assessment:   Pt admitted for cp. Plan for cath 02-20-15.     Action/Plan:   CM to monitor for disposition needs.   Anticipated DC Date:  02/21/2015   Anticipated DC Plan:  [place]  CM consult      [facility] Choice  HOME HEALTH   Choice offered to / List presented to:  C-1 Patient        [place] arranged  HH-1 RN  [place].   Status of service:  Completed, signed off Medicare Important Message given?  YES (If response is "NO", the following Medicare IM given date fields will be blank) Date Medicare IM given:  02/20/2015 Medicare IM given by:  GRAVES-BIGELOW,BRENDA Date Additional Medicare IM given:  02/21/2015 Additional Medicare IM given by:  Ellan Lambert  Discharge Disposition:  [place]  Per UR Regulation:  Reviewed for med. necessity/level of care/duration of stay  If discussed at [place] of Stay Meetings, dates discussed:    Comments:  02/21/15 Ellan Lambert, RN, BSN  [phone] Pt for poss dc today; will need HH follow up.  Referral to [facility], per pt choice.  Start of care 24-48h post dc date.  Pt has Rw and cane for home use.

## 2015-02-20 NOTE — H&P (View-Only) (Signed)
SUBJECTIVE: Had episode of chest pain last night relieved with one SL nitro. No chest pain currently. ECG earlier this morning showed controlled atrial fibrillation. Plans for cath on 2/22.     Intake/Output Summary (Last 24 hours) at 02/19/15 1158 Last data filed at 02/19/15 0600  Gross per 24 hour  Intake 541.78 ml  Output    770 ml  Net -228.22 ml    Current Facility-Administered Medications  Medication Dose Route Frequency Provider Last Rate Last Dose  . 0.9 %  sodium chloride infusion  250 mL Intravenous PRN Pixie Casino, MD      . ALPRAZolam Duanne Moron) tablet 0.25 mg  0.25 mg Oral QHS Pixie Casino, MD   0.25 mg at 02/18/15 2116  . amLODipine (NORVASC) tablet 10 mg  10 mg Oral Daily Pixie Casino, MD   10 mg at 02/19/15 0820  . aspirin EC tablet 81 mg  81 mg Oral Daily Pixie Casino, MD   81 mg at 02/19/15 3546  . atorvastatin (LIPITOR) tablet 80 mg  80 mg Oral q1800 Manus Gunning, MD   80 mg at 02/18/15 1726  . calcium carbonate (OS-CAL - dosed in mg of elemental calcium) tablet 1,250 mg  1,250 mg Oral BID WC Pixie Casino, MD   1,250 mg at 02/19/15 0820  . cholecalciferol (VITAMIN D) tablet 1,000 Units  1,000 Units Oral Daily Pixie Casino, MD   1,000 Units at 02/19/15 0819  . heparin ADULT infusion 100 units/mL (25000 units/250 mL)  1,050 Units/hr Intravenous Continuous Pixie Casino, MD 10.5 mL/hr at 02/18/15 1409 1,050 Units/hr at 02/18/15 1409  . HYDROcodone-acetaminophen (NORCO/VICODIN) 5-325 MG per tablet 1 tablet  1 tablet Oral Q6H PRN Pixie Casino, MD   1 tablet at 02/17/15 1455  . levothyroxine (SYNTHROID, LEVOTHROID) tablet 137 mcg  137 mcg Oral QAC breakfast Pixie Casino, MD   137 mcg at 02/19/15 [phone]  . metoprolol tartrate (LOPRESSOR) tablet 25 mg  25 mg Oral Daily Pixie Casino, MD   25 mg at 02/19/15 0820  . nitroGLYCERIN (NITROSTAT) SL tablet 0.4 mg  0.4 mg Sublingual Q5 Min x 3 PRN Pixie Casino, MD   0.4 mg at 02/18/15 1351  .  ondansetron (ZOFRAN) injection 4 mg  4 mg Intravenous Q6H PRN Pixie Casino, MD      . pantoprazole (PROTONIX) EC tablet 40 mg  40 mg Oral Daily Pixie Casino, MD   40 mg at 02/19/15 0820  . predniSONE (DELTASONE) tablet 3 mg  3 mg Oral Q breakfast Pixie Casino, MD   3 mg at 02/18/15 2751  . raloxifene (EVISTA) tablet 60 mg  60 mg Oral Daily Pixie Casino, MD   60 mg at 02/19/15 1057  . sodium chloride 0.9 % injection 3 mL  3 mL Intravenous Q12H Pixie Casino, MD   3 mL at 02/19/15 7001  . sodium chloride 0.9 % injection 3 mL  3 mL Intravenous PRN Pixie Casino, MD      . traMADol Veatrice Bourbon) tablet 50 mg  50 mg Oral Q6H PRN Pixie Casino, MD      . triamterene-hydrochlorothiazide (MAXZIDE-25) 37.5-25 MG per tablet 1 tablet  1 tablet Oral Daily Pixie Casino, MD   1 tablet at 02/19/15 1057  . vitamin B-12 (CYANOCOBALAMIN) tablet 100 mcg  100 mcg Oral Daily Pixie Casino, MD   100 mcg at 02/18/15  [place]:   02/18/15 2010 02/18/15 2357 02/19/15 0400 02/19/15 0409  BP: 120/78   134/70  Pulse: 92   115  Temp:  97.7 F (36.5 C) 98.2 F (36.8 C)   TempSrc:  Oral Oral   Resp: 18   21  Height:      Weight:   175 lb 11.2 oz (79.697 kg)   SpO2: 97%   97%    PHYSICAL EXAM General: NAD HEENT: Normal. Neck: No JVD, no thyromegaly.  Lungs: Clear to auscultation bilaterally with normal respiratory effort. CV: Nondisplaced PMI. Irregular rhythm, normal S1/S2, no S3, soft 1/6 ejection systolic murmur over RUSB, 2/6 holosystolic murmur along left sternal border. No pretibial edema.  Abdomen: Soft, obese, no distention.  Neurologic: Alert and oriented x 3.  Psych: Normal affect. Musculoskeletal: No gross deformities. Extremities: No clubbing or cyanosis.   TELEMETRY: Reviewed telemetry pt in controlled atrial fibrillation with episodes of tachycardia earlier.  LABS: Basic Metabolic Panel:  Recent Labs  02/17/15 0923 02/18/15 0127  NA 139 140  K  3.5 3.8  CL 106 108  CO2 23 24  GLUCOSE 109* 96  BUN 17 14  CREATININE 1.17* 1.14*  CALCIUM 9.3 8.9  MG  --  1.7   Liver Function Tests: No results for input(s): AST, ALT, ALKPHOS, BILITOT, PROT, ALBUMIN in the last 72 hours. No results for input(s): LIPASE, AMYLASE in the last 72 hours. CBC:  Recent Labs  02/17/15 0900  02/18/15 0127 02/19/15 0437  WBC 6.7  --  6.3 6.9  NEUTROABS 4.4  --   --   --   HGB 16.1*  < > 15.2* 14.5  HCT 46.5*  < > 43.7 43.0  MCV 85.3  --  85.2 86.5  PLT 213  --  256 213  < > = values in this interval not displayed. Cardiac Enzymes:  Recent Labs  02/17/15 2015 02/18/15 0127 02/18/15 0750  TROPONINI 11.77* 13.43* 10.92*   BNP: Invalid input(s): POCBNP D-Dimer:  Recent Labs  02/17/15 1000  DDIMER 0.76*   Hemoglobin A1C: No results for input(s): HGBA1C in the last 72 hours. Fasting Lipid Panel:  Recent Labs  02/18/15 0127  CHOL 167  HDL 35*  LDLCALC 104*  TRIG 142  CHOLHDL 4.8   Thyroid Function Tests:  Recent Labs  02/18/15 0750  TSH 1.970   Anemia Panel: No results for input(s): VITAMINB12, FOLATE, FERRITIN, TIBC, IRON, RETICCTPCT in the last 72 hours.  RADIOLOGY: Dg Chest 2 View  02/17/2015   CLINICAL DATA:  One day history of chest pain  EXAM: CHEST  2 VIEW  COMPARISON:  April 27, 2012  FINDINGS: There is no edema or consolidation. There is mild atelectatic change in the right middle lobe anteriorly. Heart is enlarged with pulmonary vascularity within normal limits. No adenopathy. There is degenerative change in the thoracic spine.  IMPRESSION: Atelectatic change right middle lobe anteriorly. No edema or consolidation.   Electronically Signed   By: Lowella Grip III M.D.   On: 02/17/2015 09:15   Ct Angio Chest Pe W/cm &/or Wo Cm  02/17/2015   CLINICAL DATA:  Acute chest pain today  EXAM: CT ANGIOGRAPHY CHEST WITH CONTRAST  TECHNIQUE: Multidetector CT imaging of the chest was performed using the standard protocol  during bolus administration of intravenous contrast. Multiplanar CT image reconstructions and MIPs were obtained to evaluate the vascular anatomy.  CONTRAST:  6mL OMNIPAQUE IOHEXOL 350 MG/ML SOLN  COMPARISON:  None.  FINDINGS: The lungs are well aerated bilaterally. No focal infiltrate or sizable effusion is seen. The thoracic inlet is within normal limits.  The thoracic aorta shows mild calcification without aneurysmal dilatation. Pulmonary artery is well visualized and demonstrates no evidence of pulmonary emboli. The small peripheral branches in the lower lobes bilaterally are incompletely evaluated due to patient motion artifact. No sizable hilar or mediastinal adenopathy is seen. Scattered Coronary calcifications are noted.  Scanning into the upper abdomen reveals cholelithiasis. No complicating factors are seen. The osseous structures show degenerative change of the thoracic spine. Calcified lesion is noted in the left breast. This may represent a cyst or previous hematoma  Review of the MIP images confirms the above findings.  IMPRESSION: No evidence of pulmonary emboli.  No acute abnormality is seen.   Electronically Signed   By: Inez Catalina M.D.   On: 02/17/2015 14:39      ASSESSMENT AND PLAN: 1. NSTEMI: Troponins peaked at 13.43, down to 10.92 on 2/20. Denies chest pain at present. On heparin, ASA, beta blocker, and statin. Will plan for cardiac catheterization on 2/22 unless she were to develop dynamic ECG changes or develop chest pain unable to be controlled with medications, or should she become hemodynamically unstable. Awaiting echocardiogram to assess LV systolic function and regional wall motion, as well as LA size given atrial fibrillation.  2. Atrial fibrillation: New-onset, currently controlled with tachycardia earlier on low-dose metoprolol. I will increase to 25 mg bid. Will need long-term anticoagulation given CHADSVASC score of 5. Currently on heparin.  3. Dyslipidemia with  elevated LDL: Now on high intensity statin therapy.  4. OSA: Does not like to use CPAP as she feels suffocated by mask. Likely contributing to atrial fibrillation.  5. Essential HTN: Normal today. Given NSTEMI, will d/c Maxzide, reduce amlodipine, and switch to ACEI given reduced CV and overall mortality with this class of agents.  6. Hypothyroidism: TSH normal. Continue present dose of levothyroxine.   Kate Sable, M.D., F.A.C.C.

## 2015-02-20 NOTE — Progress Notes (Signed)
Site area: right groin  Site Prior to Removal:  Level 0  Pressure Applied For 20 MINUTES    Minutes Beginning at 1515  Manual:   Yes.    Patient Status During Pull:  stable  Post Pull Groin Site:  Level 0  Post Pull Instructions Given:  Yes.    Post Pull Pulses Present:  Yes.    Dressing Applied:  Yes.    Comments:

## 2015-02-21 ENCOUNTER — Encounter (HOSPITAL_COMMUNITY): Payer: Self-pay | Admitting: Internal Medicine

## 2015-02-21 ENCOUNTER — Telehealth: Payer: Self-pay | Admitting: Nurse Practitioner

## 2015-02-21 DIAGNOSIS — B377 Candidal sepsis: Secondary | ICD-10-CM | POA: Diagnosis present

## 2015-02-21 DIAGNOSIS — I25110 Atherosclerotic heart disease of native coronary artery with unstable angina pectoris: Secondary | ICD-10-CM

## 2015-02-21 DIAGNOSIS — E039 Hypothyroidism, unspecified: Secondary | ICD-10-CM

## 2015-02-21 DIAGNOSIS — I2510 Atherosclerotic heart disease of native coronary artery without angina pectoris: Secondary | ICD-10-CM | POA: Diagnosis present

## 2015-02-21 DIAGNOSIS — I481 Persistent atrial fibrillation: Secondary | ICD-10-CM

## 2015-02-21 DIAGNOSIS — I4819 Other persistent atrial fibrillation: Secondary | ICD-10-CM | POA: Diagnosis present

## 2015-02-21 LAB — BASIC METABOLIC PANEL
ANION GAP: 7 (ref 5–15)
BUN: 15 mg/dL (ref 6–23)
CALCIUM: 8.5 mg/dL (ref 8.4–10.5)
CHLORIDE: 110 mmol/L (ref 96–112)
CO2: 24 mmol/L (ref 19–32)
Creatinine, Ser: 1.02 mg/dL (ref 0.50–1.10)
GFR calc non Af Amer: 51 mL/min — ABNORMAL LOW (ref >90)
GFR, EST AFRICAN AMERICAN: 59 mL/min — AB (ref >90)
Glucose, Bld: 87 mg/dL (ref 70–99)
Potassium: 3.9 mmol/L (ref 3.5–5.1)
SODIUM: 141 mmol/L (ref 135–145)

## 2015-02-21 LAB — CBC
HEMATOCRIT: 41.6 % (ref 36.0–46.0)
Hemoglobin: 14.3 g/dL (ref 12.0–15.0)
MCH: 30.3 pg (ref 26.0–34.0)
MCHC: 34.4 g/dL (ref 30.0–36.0)
MCV: 88.1 fL (ref 78.0–100.0)
Platelets: 199 10*3/uL (ref 150–400)
RBC: 4.72 MIL/uL (ref 3.87–5.11)
RDW: 13.4 % (ref 11.5–15.5)
WBC: 7.1 10*3/uL (ref 4.0–10.5)

## 2015-02-21 MED ORDER — FLUCONAZOLE 150 MG PO TABS
150.0000 mg | ORAL_TABLET | Freq: Once | ORAL | Status: AC
  Administered 2015-02-21: 150 mg via ORAL
  Filled 2015-02-21: qty 1

## 2015-02-21 MED ORDER — MAGNESIUM HYDROXIDE 400 MG/5ML PO SUSP
30.0000 mL | Freq: Once | ORAL | Status: AC
  Administered 2015-02-21: 14:00:00 30 mL via ORAL
  Filled 2015-02-21: qty 30

## 2015-02-21 MED ORDER — NITROGLYCERIN 0.4 MG SL SUBL: 0.4000 mg | SUBLINGUAL_TABLET | SUBLINGUAL | Status: DC | PRN

## 2015-02-21 MED ORDER — CLOPIDOGREL BISULFATE 75 MG PO TABS: 75.0000 mg | ORAL_TABLET | Freq: Every day | ORAL | Status: DC

## 2015-02-21 MED ORDER — ASPIRIN 81 MG PO CHEW: 81.0000 mg | CHEWABLE_TABLET | Freq: Every day | ORAL | Status: AC

## 2015-02-21 MED ORDER — LISINOPRIL 5 MG PO TABS: 5.0000 mg | ORAL_TABLET | Freq: Every day | ORAL | Status: DC

## 2015-02-21 MED ORDER — ATORVASTATIN CALCIUM 80 MG PO TABS: 80.0000 mg | ORAL_TABLET | Freq: Every day | ORAL | Status: DC

## 2015-02-21 MED ORDER — PANTOPRAZOLE SODIUM 40 MG PO TBEC: 40.0000 mg | DELAYED_RELEASE_TABLET | Freq: Every day | ORAL | Status: DC

## 2015-02-21 MED ORDER — AMLODIPINE BESYLATE 5 MG PO TABS: 5.0000 mg | ORAL_TABLET | Freq: Every day | ORAL | Status: DC

## 2015-02-21 MED ORDER — WARFARIN SODIUM 5 MG PO TABS: 5.0000 mg | ORAL_TABLET | Freq: Every day | ORAL | Status: DC

## 2015-02-21 NOTE — Progress Notes (Signed)
CARDIAC REHAB PHASE I   PRE:  Rate/Rhythm: 85 afib    BP: sitting 139/69    SaO2:   MODE:  Ambulation: 150 ft   POST:  Rate/Rhythm: 126 afib    BP: sitting 126/64     SaO2:   Pt very unsteady, reaches for furniture. Sts she has RW and cane at home but uses them rarely. Sts she still does all of her housework. Able to walk down hall with RW, much steadier. Leaned on RW due to bent posture. Needs shorter RW which she sts she does have at home. Pt need HHRN and HHPT which she is agreeable to although reluctantly. Husband present and discussed with pt and him importance of her safety being on 3 blood thinners. Pt admits to being a "clean freak". Encouraged her to be practical and be able to rely on others. Discussed plavix, asa and coumadin, NTG, eating more vegetables (mostly eating chicken and rice soup) and MI. Pt n/a for ex gl or CRPII. [phone]  [name] [name] CES, ACSM 02/21/2015 10:25 AM

## 2015-02-21 NOTE — Consult Note (Signed)
WOC wound consult note Pt does report issues with redness under her breast that she treats with corn starch and hydrocortisone  Reason for Consult: rash inframammary and bilateral groin Wound type: MASD (moisture associated skin damage) with fungal overgrowth Measurement: intense erythema under the breast and in the bilateral groin areas with satellite patches of affected skin  Wound bed: pink, dry, some scaling  Drainage (amount, consistency, odor) none Periwound:intact Dressing procedure/placement/frequency: Recommend PO Diflucan and use of antimicrobial wicking fabric to lessen moisture under the breast and in the groin. Wicking fabric will also serve as antimicrobial ot assist in the clearing of the fungus present at current.  Will need nursing staff to teach pt how to use Interdry Ag+  Interdry Ag+ ordered to bedside, will need cardiology to order Diflucan  Discussed POC with patient and bedside nurse.  Re consult if needed, will not follow at this time. Thanks  Justin Buechner Kellogg, [place] [phone])

## 2015-02-21 NOTE — Telephone Encounter (Signed)
New message      TCM appt on 02-27-15 per Ignacia Bayley.

## 2015-02-21 NOTE — Discharge Instructions (Signed)
**  PLEASE REMEMBER TO BRING ALL OF YOUR MEDICATIONS TO EACH OF YOUR FOLLOW-UP OFFICE VISITS. ° °NO HEAVY LIFTING X 2 WEEKS. °NO SEXUAL ACTIVITY X 2 WEEKS. °NO DRIVING X 1 WEEK. °NO SOAKING BATHS, HOT TUBS, POOLS, ETC., X 7 DAYS. ° °Groin Site Care °Refer to this sheet in the next few weeks. These instructions provide you with information on caring for yourself after your procedure. Your caregiver may also give you more specific instructions. Your treatment has been planned according to current medical practices, but problems sometimes occur. Call your caregiver if you have any problems or questions after your procedure. °HOME CARE INSTRUCTIONS °· You may shower 24 hours after the procedure. Remove the bandage (dressing) and gently wash the site with plain soap and water. Gently pat the site dry.  °· Do not apply powder or lotion to the site.  °· Do not sit in a bathtub, swimming pool, or whirlpool for 5 to 7 days.  °· No bending, squatting, or lifting anything over 10 pounds (4.5 kg) as directed by your caregiver.  °· Inspect the site at least twice daily.  °· Do not drive home if you are discharged the same day of the procedure. Have someone else drive you.  ° °What to expect: °· Any bruising will usually fade within 1 to 2 weeks.  °· Blood that collects in the tissue (hematoma) may be painful to the touch. It should usually decrease in size and tenderness within 1 to 2 weeks.  °SEEK IMMEDIATE MEDICAL CARE IF: °· You have unusual pain at the groin site or down the affected leg.  °· You have redness, warmth, swelling, or pain at the groin site.  °· You have drainage (other than a small amount of blood on the dressing).  °· You have chills.  °· You have a fever or persistent symptoms for more than 72 hours.  °· You have a fever and your symptoms suddenly get worse.  °· Your leg becomes pale, cool, tingly, or numb.  °You have heavy bleeding from the site. Hold pressure on the site. . °  °

## 2015-02-21 NOTE — Discharge Summary (Signed)
Discharge Summary   Patient ID: [name],  MRN: [id number], DOB/AGE: [birth year] 81 y.o.  Admit date: 02/17/2015 Discharge date: 02/21/2015  Primary Care Provider: [facility] Primary Cardiologist: C. Hilty, MD   Discharge Diagnoses Principal Problem:   NSTEMI, initial episode of care  **s/p PCI/BMS to the OM1 this admission.  Active Problems:   CAD (coronary artery disease)   Hypertension   A-fib  **New Dx CHA2DS2VASc = 5 coumadin initiated.   Hypothyroid   Hyperlipidemia   Osteoporosis   Sleep apnea   Candida infection, disseminated  **Seen by wound care and treated with diflucan 150 mg x 1 this admission.   Hypothyroidism  Allergies Allergies  Allergen Reactions  . Fosamax [Alendronate Sodium]     Procedures  CT Angio of the Chest with Contrast  2.19.2016  IMPRESSION: No evidence of pulmonary emboli.  No acute abnormality is seen. _____________   2D Echocardiogram 2.21.2016  Study Conclusions  - Left ventricle: The cavity size was normal. Wall thickness was   increased in a pattern of mild LVH. There was mild focal basal   hypertrophy of the septum. Systolic function was normal. The   estimated ejection fraction was in the range of 50% to 55%. Wall   motion was normal; there were no regional wall motion   abnormalities. - Aortic valve: There was mild stenosis. Valve area (VTI): 0.79   cm^2. Valve area (Vmax): 0.8 cm^2. Valve area (Vmean): 0.71 cm^2. - Mitral valve: There was mild regurgitation. - Left atrium: The atrium was mildly dilated. - Tricuspid valve: There was moderate regurgitation. - Pulmonary arteries: PA peak pressure: 53 mm Hg (S). - Pericardium, extracardiac: A trivial pericardial effusion was   identified posterior to the heart. _____________   Cardiac Catheterization and Percutaneous Coronary Intervention 2.22.2016  Findings:  Ao Pressure: 127/66 (93) LV Pressure: 130/5/11 There was no signficant gradient across the  aortic valve on pullback.  Left main: Normal LAD: Long vessel wraps apex. Gives off 2 diagonals. 30% proximal stenosis. Mild diffuse plaque throughout LCX: Nondominant vessel. Gives off 2 OM-s. 30-40% proximal stenosis. 50% stenosis in distal AV-groove LCX. OM-1 occluded proximally.    **The OM1 was successfully stented using a 2.75 x 16 mm Rebel bare metal stent.**  RCA: Dominant vessel. Mild plaque throughout.  LV-gram done in the RAO projection: Ejection fraction = 55%. No obvious regional wall motion abnormality.  _____________   History of Present Illness  81 year old female without a prior cardiac history. She does have a history of hypertension, hyperlipidemia, hypothyroidism, sleep apnea, and osteoporosis in the setting of chronic steroid therapy. She was in her usual state of health until 02/17/2015, when she developed severe chest discomfort and back pain. Her husband brought her into the emergency department where she received 2 sublingual nitroglycerin tablets with significant relief. D-dimer was mildly elevated at 0.76 and CT angiography of the chest was performed and was negative for pulmonary embolus. EKG showed rate-controlled atrial fibrillation, which was a new diagnosis. She did have mild ST segment changes inferiorly which improved following resolution of chest pain. Initial troponin was mildly elevated at 0.05. She was admitted for further evaluation.  Hospital Course  Patient ruled in for non-ST segment elevation myocardial infarction MI eventually peaking her troponin at 13.43. She was maintained on aspirin, statin, beta blocker, and heparin therapy. She did have intermittent chest discomfort which was nitrate responsive. She remained rate controlled atrial fibrillation. Echocardiogram on February 22, revealed normal LV function without significant valvular  pathology. Decision was made to pursue diagnostic catheterization, which took place on February 22, revealing an  occluded obtuse marginal branch of the left circumflex with otherwise nonobstructive disease. EF was 55%. Films were reviewed she subsequently underwent successful PCI and stenting of the first obtuse marginal with placement of a 2.75 x 16 mm bare-metal stent. A bare-metal stent was chosen in the setting of new onset A. fib with a CHA2DS2VASc score of 5. Postprocedure, she has not had any recurrence of chest pain or dyspnea. She has ambulated with cardiac rehabilitation and has been shown to be somewhat deconditioned. We have arranged for home health RN and physical therapy. We have also initiated Coumadin therapy with a plan to continue aspirin, Plavix, and Coumadin with subsequent discontinuation of aspirin after 30 days. We have arranged for Coumadin follow-up later this week with subsequent clinic follow-up within the next 7 days. She will be discharged home today in good condition.   Of note, patient was noted to have significant candida in her skin folds. She was seen by wound management and based on their recommendations, she was treated with 1 dose of Diflucan 150 mg. She was also ordered wicking material to place and skin folds.    Discharge Vitals Blood pressure 139/69, pulse 83, temperature 97.5 F (36.4 C), temperature source Oral, resp. rate 16, height 5\' 2"  (1.575 m), weight 174 lb 2.6 oz (79 kg), SpO2 98 %.  Filed Weights   02/19/15 0400 02/20/15 0400 02/21/15 0020  Weight: 175 lb 11.2 oz (79.697 kg) 175 lb 4.3 oz (79.5 kg) 174 lb 2.6 oz (79 kg)    Labs  CBC  Recent Labs  02/20/15 0319 02/21/15 0431  WBC 7.4 7.1  HGB 14.3 14.3  HCT 41.8 41.6  MCV 86.5 88.1  PLT 230 199   Basic Metabolic Panel  Recent Labs  02/20/15 0319 02/21/15 0431  NA 140 141  K 3.7 3.9  CL 107 110  CO2 26 24  GLUCOSE 96 87  BUN 17 15  CREATININE 1.17* 1.02  CALCIUM 8.6 8.5   Cardiac Enzymes Lab Results  Component Value Date   TROPONINI 10.92* 02/18/2015     Fasting Lipid Panel Lab  Results  Component Value Date   CHOL 167 02/18/2015   HDL 35* 02/18/2015   LDLCALC 104* 02/18/2015   TRIG 142 02/18/2015   CHOLHDL 4.8 02/18/2015    Thyroid Function Tests Lab Results  Component Value Date   TSH 1.970 02/18/2015    Disposition  Pt is being discharged home today in good condition.  Follow-up Plans & Appointments  Follow-up Information    Follow up with Nicolasa Ducking, NP On 02/27/2015.   Specialty:  Nurse Practitioner   Why:  9:30 - Dr. Blanchie Dessert NP   Contact information:   [address] [place] [address] [phone]       Follow up with [facility] Office On 02/24/2015.   Why:  9:30 AM   Contact information:   [address] [place] [phone]      Discharge Medications    Medication List    STOP taking these medications        meloxicam 7.5 MG tablet  Commonly known as:  MOBIC     omeprazole 20 MG capsule  Commonly known as:  PRILOSEC  Replaced by:  pantoprazole 40 MG tablet     triamterene-hydrochlorothiazide 37.5-25 MG per tablet  Commonly known as:  [employer]  TAKE these medications        ALPRAZolam 0.25 MG tablet  Commonly known as:  XANAX  Take 0.25 mg by mouth at bedtime.     amLODipine 5 MG tablet  Commonly known as:  NORVASC  Take 1 tablet (5 mg total) by mouth daily.     aspirin 81 MG chewable tablet  Chew 1 tablet (81 mg total) by mouth daily.     atorvastatin 80 MG tablet  Commonly known as:  LIPITOR  Take 1 tablet (80 mg total) by mouth daily at 6 PM.     calcium carbonate 1250 MG capsule  Take 1,250 mg by mouth 2 (two) times daily with a meal.     cholecalciferol 1000 UNITS tablet  Commonly known as:  VITAMIN D  Take 1,000 Units by mouth daily.     clopidogrel 75 MG tablet  Commonly known as:  PLAVIX  Take 1 tablet (75 mg total) by mouth daily with breakfast.     cyanocobalamin 1000 MCG tablet  Take 100 mcg by mouth daily.      HYDROcodone-acetaminophen 5-325 MG per tablet  Commonly known as:  NORCO/VICODIN  Take 1 tablet by mouth every 6 (six) hours as needed for moderate pain.     levothyroxine 137 MCG tablet  Commonly known as:  SYNTHROID, LEVOTHROID  Take 1 tablet (137 mcg total) by mouth daily.     lisinopril 5 MG tablet  Commonly known as:  PRINIVIL,ZESTRIL  Take 1 tablet (5 mg total) by mouth daily.     metoprolol 50 MG tablet  Commonly known as:  LOPRESSOR  Take 25 mg by mouth daily.     nitroGLYCERIN 0.4 MG SL tablet  Commonly known as:  NITROSTAT  Place 1 tablet (0.4 mg total) under the tongue every 5 (five) minutes x 3 doses as needed for chest pain.     pantoprazole 40 MG tablet  Commonly known as:  PROTONIX  Take 1 tablet (40 mg total) by mouth daily.     predniSONE 1 MG tablet  Commonly known as:  DELTASONE  Take 3 mg by mouth daily with breakfast.     raloxifene 60 MG tablet  Commonly known as:  EVISTA  Take 1 tablet (60 mg total) by mouth daily.     traMADol 50 MG tablet  Commonly known as:  ULTRAM  Take 50 mg by mouth every 6 (six) hours as needed for moderate pain.     warfarin 5 MG tablet  Commonly known as:  COUMADIN  Take 1 tablet (5 mg total) by mouth daily.       Outstanding Labs/Studies  F/U INR on 2/26 as scheduled. F/U lipids/lft's in 6-8 wks.  Duration of Discharge Encounter   Greater than 30 minutes including physician time.  Signed, Nicolasa Ducking NP 02/21/2015, 9:39 AM   I have examined the patient and reviewed assessment and plan and discussed with patient.  Agree with above as stated.  Patient with bare metal stent.  RRR S1 S2, no wheezing, no right groin hematoma, tr DP pulse on right.  D/c on aspirin and Plavix. Due to her atrial fibrillation, we'll start warfarin with her first dose being today. Would not start a novel oral anticoagulant due to the fact that she requires antiplatelet therapy.  After a month, could consider stopping Plavix and  switching Coumadin to a NOAC. A bare-metal stent was placed and therefore, long-term dual antiplatelet therapy is not required.  Her husband sees Dr.  [place]. She will have an INR checked on Friday.    Carolyn Antonetti S.

## 2015-02-22 MED FILL — Sodium Chloride IV Soln 0.9%: INTRAVENOUS | Qty: 50 | Status: AC

## 2015-02-22 NOTE — Telephone Encounter (Signed)
Patient contacted regarding discharge from [facility] on February 21, 2015.  Patient understands to follow up with provider Ignacia Bayley, NP on February 27, 2015 at 9:30AM at [facility] . Patient understands discharge instructions? yes Patient understands medications and regiment? Yes Patient understands to bring all medications to this visit? yes  Spoke with pt and she asked that I speak with her husband because she wanted him to write down the appointments so she wouldn't forget.

## 2015-02-23 DIAGNOSIS — G4733 Obstructive sleep apnea (adult) (pediatric): Secondary | ICD-10-CM | POA: Diagnosis not present

## 2015-02-23 DIAGNOSIS — E785 Hyperlipidemia, unspecified: Secondary | ICD-10-CM | POA: Diagnosis not present

## 2015-02-23 DIAGNOSIS — E039 Hypothyroidism, unspecified: Secondary | ICD-10-CM | POA: Diagnosis not present

## 2015-02-23 DIAGNOSIS — I4891 Unspecified atrial fibrillation: Secondary | ICD-10-CM | POA: Diagnosis not present

## 2015-02-23 DIAGNOSIS — I2510 Atherosclerotic heart disease of native coronary artery without angina pectoris: Secondary | ICD-10-CM | POA: Diagnosis not present

## 2015-02-23 DIAGNOSIS — M810 Age-related osteoporosis without current pathological fracture: Secondary | ICD-10-CM | POA: Diagnosis not present

## 2015-02-23 DIAGNOSIS — I10 Essential (primary) hypertension: Secondary | ICD-10-CM | POA: Diagnosis not present

## 2015-02-23 DIAGNOSIS — I222 Subsequent non-ST elevation (NSTEMI) myocardial infarction: Secondary | ICD-10-CM | POA: Diagnosis not present

## 2015-02-24 ENCOUNTER — Ambulatory Visit (INDEPENDENT_AMBULATORY_CARE_PROVIDER_SITE_OTHER): Payer: Medicare Other

## 2015-02-24 DIAGNOSIS — I4891 Unspecified atrial fibrillation: Secondary | ICD-10-CM

## 2015-02-24 DIAGNOSIS — E039 Hypothyroidism, unspecified: Secondary | ICD-10-CM | POA: Diagnosis not present

## 2015-02-24 DIAGNOSIS — M810 Age-related osteoporosis without current pathological fracture: Secondary | ICD-10-CM | POA: Diagnosis not present

## 2015-02-24 DIAGNOSIS — I10 Essential (primary) hypertension: Secondary | ICD-10-CM | POA: Diagnosis not present

## 2015-02-24 DIAGNOSIS — Z5181 Encounter for therapeutic drug level monitoring: Secondary | ICD-10-CM

## 2015-02-24 DIAGNOSIS — I222 Subsequent non-ST elevation (NSTEMI) myocardial infarction: Secondary | ICD-10-CM | POA: Diagnosis not present

## 2015-02-24 DIAGNOSIS — E785 Hyperlipidemia, unspecified: Secondary | ICD-10-CM | POA: Diagnosis not present

## 2015-02-24 DIAGNOSIS — I2510 Atherosclerotic heart disease of native coronary artery without angina pectoris: Secondary | ICD-10-CM | POA: Diagnosis not present

## 2015-02-24 DIAGNOSIS — G4733 Obstructive sleep apnea (adult) (pediatric): Secondary | ICD-10-CM | POA: Diagnosis not present

## 2015-02-24 LAB — POCT INR: INR: 3.6

## 2015-02-24 NOTE — Patient Instructions (Signed)

## 2015-02-27 ENCOUNTER — Ambulatory Visit (INDEPENDENT_AMBULATORY_CARE_PROVIDER_SITE_OTHER): Payer: Medicare Other | Admitting: *Deleted

## 2015-02-27 ENCOUNTER — Ambulatory Visit (INDEPENDENT_AMBULATORY_CARE_PROVIDER_SITE_OTHER): Payer: Medicare Other | Admitting: Nurse Practitioner

## 2015-02-27 ENCOUNTER — Encounter: Payer: Self-pay | Admitting: Nurse Practitioner

## 2015-02-27 VITALS — BP 128/60 | HR 106 | Ht 62.0 in | Wt 171.8 lb

## 2015-02-27 DIAGNOSIS — I4891 Unspecified atrial fibrillation: Secondary | ICD-10-CM | POA: Diagnosis not present

## 2015-02-27 DIAGNOSIS — Z5181 Encounter for therapeutic drug level monitoring: Secondary | ICD-10-CM

## 2015-02-27 DIAGNOSIS — I222 Subsequent non-ST elevation (NSTEMI) myocardial infarction: Secondary | ICD-10-CM | POA: Diagnosis not present

## 2015-02-27 DIAGNOSIS — E785 Hyperlipidemia, unspecified: Secondary | ICD-10-CM

## 2015-02-27 DIAGNOSIS — I25110 Atherosclerotic heart disease of native coronary artery with unstable angina pectoris: Secondary | ICD-10-CM | POA: Diagnosis not present

## 2015-02-27 DIAGNOSIS — I10 Essential (primary) hypertension: Secondary | ICD-10-CM | POA: Diagnosis not present

## 2015-02-27 DIAGNOSIS — I214 Non-ST elevation (NSTEMI) myocardial infarction: Secondary | ICD-10-CM

## 2015-02-27 LAB — PROTIME-INR
INR: 8.7 ratio (ref 0.8–1.0)
Prothrombin Time: 90.6 s (ref 9.6–13.1)

## 2015-02-27 LAB — POCT INR: INR: 7.3

## 2015-02-27 MED ORDER — WARFARIN SODIUM 1 MG PO TABS: 1.0000 mg | ORAL_TABLET | Freq: Every day | ORAL | Status: DC

## 2015-02-27 MED ORDER — METOPROLOL TARTRATE 50 MG PO TABS: 25.0000 mg | ORAL_TABLET | Freq: Two times a day (BID) | ORAL | Status: DC

## 2015-02-27 NOTE — Patient Instructions (Signed)
Your physician has recommended you make the following change in your medication:  1) INCREASE Metoprolol to 25mg  (1/2 Tablet) Twice daily 2) STOP Aspirin on 03/22/15  Take all other medications as prescribed  Your physician recommends that you schedule a follow-up appointment in: 2 months with Dr.Hilty

## 2015-02-27 NOTE — Progress Notes (Signed)
Patient Name: [name] Date of Encounter: 02/27/2015  Primary Care Provider:  Odette Fraction, MD Primary Cardiologist:  C. Hilty, MD   Chief Complaint  81 year old female status post recent hospitalization for non-ST elevation MI and A. fib, who presents for follow-up today.  Past Medical History   Past Medical History  Diagnosis Date  . Hyperlipidemia   . Hypertension   . Hypothyroid   . Sleep apnea 04/2012    Mild/ AHI 10/CPAP 7cm h2o w/2 L o2  . Osteoporosis   . CAD (coronary artery disease)     a. 01/2015 NSTEMI/PCI: LM nl, LAD 30p, LCX nondom, 30-40p, 50d, OM1 100p (2.75x16 Rebel BMS), RCA mild diff plaque, EF 55%.  . A-fib     a. Dx 01/2015, CHA2DS2VASc = 5-->coumadin started.  . Candida infection, disseminated   . H/O echocardiogram     a. 01/2015 Echo: EF 50-55%, mild LVH, no rwma, mild MR, mildly dil LA, mod TR, PASP 53 mmHg.   Past Surgical History  Procedure Laterality Date  . Left heart catheterization with coronary angiogram N/A 02/20/2015    Procedure: LEFT HEART CATHETERIZATION WITH CORONARY ANGIOGRAM;  Surgeon: Jolaine Artist, MD;  Location: [facility] CATH LAB;  Service: Cardiovascular;  Laterality: N/A;  . Percutaneous coronary stent intervention (pci-s)  02/20/2015    Procedure: PERCUTANEOUS CORONARY STENT INTERVENTION (PCI-S);  Surgeon: Jolaine Artist, MD;  Location: [facility] CATH LAB;  Service: Cardiovascular;;  OM1  (2.75/39mm Rebel)   Allergies  Allergies  Allergen Reactions  . Fosamax [Alendronate Sodium]    HPI  81 year old female with a prior history of hypertension, hyperlipidemia, hypothyroidism, sleep apnea, and osteoporosis in the setting of chronic steroid therapy. She was recently admitted to [facility] with severe chest and back discomfort. EKG showed rate-controlled atrial fibrillation which was a new diagnosis. She ruled in for MI. Echo showed normal LV function. Cath revealed an occluded obtuse marginal branch of the left circumflex  with other wise nonobstructive disease and normal LV function. The obtuse marginal was successfully stented using a 2.75 x 16 mm Rebel BMS. She did well postprocedure. With new onset A. fib, she was placed on Coumadin with a plan to continue aspirin, Plavix, and warfarin for 1 month and then to discontinue aspirin at that time.  Since discharge, she has done well. She has not had any chest pain or dyspnea. She says that she has been taking it fairly easy because she was.  She denies chest pain, palpitations, dyspnea, pnd, orthopnea, n, v, dizziness, syncope, edema, weight gain, or early satiety.  Right groin catheterization site has been healing well.  Home Medications  Prior to Admission medications   Medication Sig Start Date End Date Taking? Authorizing Provider  ALPRAZolam (XANAX) 0.25 MG tablet Take 0.25 mg by mouth at bedtime.   Yes Historical Provider, MD  amLODipine (NORVASC) 5 MG tablet Take 1 tablet (5 mg total) by mouth daily. 02/21/15  Yes Rogelia Mire, NP  aspirin 81 MG chewable tablet Chew 1 tablet (81 mg total) by mouth daily. 02/21/15 03/22/15 Yes Rogelia Mire, NP  atorvastatin (LIPITOR) 80 MG tablet Take 1 tablet (80 mg total) by mouth daily at 6 PM. 02/21/15  Yes Rogelia Mire, NP  calcium carbonate 1250 MG capsule Take 1,250 mg by mouth 2 (two) times daily with a meal.   Yes Historical Provider, MD  cholecalciferol (VITAMIN D) 1000 UNITS tablet Take 1,000 Units by mouth daily.   Yes Historical Provider,  MD  clopidogrel (PLAVIX) 75 MG tablet Take 1 tablet (75 mg total) by mouth daily with breakfast. 02/21/15  Yes Rogelia Mire, NP  cyanocobalamin 1000 MCG tablet Take 100 mcg by mouth daily.   Yes Historical Provider, MD  HYDROcodone-acetaminophen (NORCO/VICODIN) 5-325 MG per tablet Take 1 tablet by mouth every 6 (six) hours as needed for moderate pain.   Yes Historical Provider, MD  levothyroxine (SYNTHROID, LEVOTHROID) 137 MCG tablet Take 1 tablet (137 mcg  total) by mouth daily. 02/06/15  Yes [name], MD  lisinopril (PRINIVIL,ZESTRIL) 5 MG tablet Take 1 tablet (5 mg total) by mouth daily. 02/21/15  Yes Rogelia Mire, NP  metoprolol (LOPRESSOR) 50 MG tablet Take 0.5 tablets (25 mg total) by mouth 2 (two) times daily. 02/27/15  Yes Rogelia Mire, NP  nitroGLYCERIN (NITROSTAT) 0.4 MG SL tablet Place 1 tablet (0.4 mg total) under the tongue every 5 (five) minutes x 3 doses as needed for chest pain. 02/21/15  Yes Rogelia Mire, NP  pantoprazole (PROTONIX) 40 MG tablet Take 1 tablet (40 mg total) by mouth daily. 02/21/15  Yes Rogelia Mire, NP  raloxifene (EVISTA) 60 MG tablet Take 1 tablet (60 mg total) by mouth daily. 02/06/15  Yes [name], MD  traMADol (ULTRAM) 50 MG tablet Take 50 mg by mouth every 6 (six) hours as needed for moderate pain.   Yes Historical Provider, MD  warfarin (COUMADIN) 1 MG tablet Take 1 tablet (1 mg total) by mouth daily. 02/27/15   Jettie Booze, MD    Review of Systems  She denies chest pain, palpitations, dyspnea, pnd, orthopnea, n, v, dizziness, syncope, edema, weight gain, or early satiety.  All other systems reviewed and are otherwise negative except as noted above.  Physical Exam  VS:  BP 128/60 mmHg  Pulse 106  Ht 5\' 2"  (1.575 m)  Wt 171 lb 12.8 oz (77.928 kg)  BMI 31.41 kg/m2 , BMI Body mass index is 31.41 kg/(m^2). GEN: Well nourished, well developed, in no acute distress. HEENT: normal. Neck: Supple, no carotid bruits, or masses. Obese - difficult to gauge jvp. Cardiac: IR, IR, no murmurs, rubs, or gallops. No clubbing, cyanosis, edema.  Radials/DP/PT 2+ and equal bilaterally. R groin cath site w/o bleeding/bruit/hematoma. Respiratory:  Respirations regular and unlabored, clear to auscultation bilaterally. GI: Soft, nontender, nondistended, BS + x 4. MS: no deformity or atrophy. Skin: warm and dry, no rash. Neuro:  Strength and sensation are intact. Psych: Normal  affect.  Accessory Clinical Findings  ECG - atrial fibrillation, 94, inferior infarct, poor R-wave progression, no acute ST or T changes.  Assessment & Plan  1.  Non-ST segment elevation myocardial infarction, subsequent episode of care/coronary artery disease: Patient is status post recent admission with non-STEMI requiring PCI and bare metal stent placement to the occluded obtuse marginal. She otherwise had nonobstructive disease and normal LV function. She's not been having any chest pain or dyspnea since discharge. She remains on aspirin, statin, Plavix, beta blocker, and ACE inhibitor therapy. She is currently only taking short acting metoprolol once daily. I recommended that she take 25 twice a day instead, especially in light of elevated heart rate today. We also discussed the plan for her anticoagulant therapy. She will discontinue aspirin following 30 days of therapy and from there continue Plavix and Coumadin going forward. She is not sure if she will partake in cardiac rehabilitation but currently has home health PT coming out to the house.  2. Hypertension: Stable.  3. Hyperlipidemia: Continue statin therapy. LDL was 104 on February 20. She will require follow-up lipids and LFTs in 6 weeks.  4. Atrial fibrillation: This is a new diagnosis from her recent admission. She is asymptomatic and reasonably well rate controlled though there is room for improvement. I have increased her metoprolol to 25 mg twice a day. She had previously been taking short-acting metoprolol daily only. She remains on warfarin and is followed in Coumadin clinic.  5. Disposition: Follow-up with Dr. Debara Pickett in approximately 2 months. We can check lipids and L these at that time.  Murray Hodgkins, NP 02/27/2015, 1:47 PM

## 2015-02-28 DIAGNOSIS — E785 Hyperlipidemia, unspecified: Secondary | ICD-10-CM | POA: Diagnosis not present

## 2015-02-28 DIAGNOSIS — E039 Hypothyroidism, unspecified: Secondary | ICD-10-CM | POA: Diagnosis not present

## 2015-02-28 DIAGNOSIS — I4891 Unspecified atrial fibrillation: Secondary | ICD-10-CM | POA: Diagnosis not present

## 2015-02-28 DIAGNOSIS — I222 Subsequent non-ST elevation (NSTEMI) myocardial infarction: Secondary | ICD-10-CM | POA: Diagnosis not present

## 2015-02-28 DIAGNOSIS — I10 Essential (primary) hypertension: Secondary | ICD-10-CM | POA: Diagnosis not present

## 2015-02-28 DIAGNOSIS — G4733 Obstructive sleep apnea (adult) (pediatric): Secondary | ICD-10-CM | POA: Diagnosis not present

## 2015-02-28 DIAGNOSIS — M810 Age-related osteoporosis without current pathological fracture: Secondary | ICD-10-CM | POA: Diagnosis not present

## 2015-02-28 DIAGNOSIS — I2510 Atherosclerotic heart disease of native coronary artery without angina pectoris: Secondary | ICD-10-CM | POA: Diagnosis not present

## 2015-03-02 DIAGNOSIS — E039 Hypothyroidism, unspecified: Secondary | ICD-10-CM | POA: Diagnosis not present

## 2015-03-02 DIAGNOSIS — I222 Subsequent non-ST elevation (NSTEMI) myocardial infarction: Secondary | ICD-10-CM | POA: Diagnosis not present

## 2015-03-02 DIAGNOSIS — M810 Age-related osteoporosis without current pathological fracture: Secondary | ICD-10-CM | POA: Diagnosis not present

## 2015-03-02 DIAGNOSIS — I2510 Atherosclerotic heart disease of native coronary artery without angina pectoris: Secondary | ICD-10-CM | POA: Diagnosis not present

## 2015-03-02 DIAGNOSIS — E785 Hyperlipidemia, unspecified: Secondary | ICD-10-CM | POA: Diagnosis not present

## 2015-03-02 DIAGNOSIS — I10 Essential (primary) hypertension: Secondary | ICD-10-CM | POA: Diagnosis not present

## 2015-03-02 DIAGNOSIS — G4733 Obstructive sleep apnea (adult) (pediatric): Secondary | ICD-10-CM | POA: Diagnosis not present

## 2015-03-02 DIAGNOSIS — I4891 Unspecified atrial fibrillation: Secondary | ICD-10-CM | POA: Diagnosis not present

## 2015-03-03 ENCOUNTER — Ambulatory Visit (INDEPENDENT_AMBULATORY_CARE_PROVIDER_SITE_OTHER): Payer: Medicare Other | Admitting: *Deleted

## 2015-03-03 DIAGNOSIS — I4891 Unspecified atrial fibrillation: Secondary | ICD-10-CM

## 2015-03-03 DIAGNOSIS — Z5181 Encounter for therapeutic drug level monitoring: Secondary | ICD-10-CM | POA: Diagnosis not present

## 2015-03-03 LAB — POCT INR: INR: 3.9

## 2015-03-07 DIAGNOSIS — I10 Essential (primary) hypertension: Secondary | ICD-10-CM | POA: Diagnosis not present

## 2015-03-07 DIAGNOSIS — G4733 Obstructive sleep apnea (adult) (pediatric): Secondary | ICD-10-CM | POA: Diagnosis not present

## 2015-03-07 DIAGNOSIS — E039 Hypothyroidism, unspecified: Secondary | ICD-10-CM | POA: Diagnosis not present

## 2015-03-07 DIAGNOSIS — I2510 Atherosclerotic heart disease of native coronary artery without angina pectoris: Secondary | ICD-10-CM | POA: Diagnosis not present

## 2015-03-07 DIAGNOSIS — I222 Subsequent non-ST elevation (NSTEMI) myocardial infarction: Secondary | ICD-10-CM | POA: Diagnosis not present

## 2015-03-07 DIAGNOSIS — E785 Hyperlipidemia, unspecified: Secondary | ICD-10-CM | POA: Diagnosis not present

## 2015-03-07 DIAGNOSIS — M810 Age-related osteoporosis without current pathological fracture: Secondary | ICD-10-CM | POA: Diagnosis not present

## 2015-03-07 DIAGNOSIS — I4891 Unspecified atrial fibrillation: Secondary | ICD-10-CM | POA: Diagnosis not present

## 2015-03-09 DIAGNOSIS — E039 Hypothyroidism, unspecified: Secondary | ICD-10-CM | POA: Diagnosis not present

## 2015-03-09 DIAGNOSIS — I2510 Atherosclerotic heart disease of native coronary artery without angina pectoris: Secondary | ICD-10-CM | POA: Diagnosis not present

## 2015-03-09 DIAGNOSIS — E785 Hyperlipidemia, unspecified: Secondary | ICD-10-CM | POA: Diagnosis not present

## 2015-03-09 DIAGNOSIS — I10 Essential (primary) hypertension: Secondary | ICD-10-CM | POA: Diagnosis not present

## 2015-03-09 DIAGNOSIS — G4733 Obstructive sleep apnea (adult) (pediatric): Secondary | ICD-10-CM | POA: Diagnosis not present

## 2015-03-09 DIAGNOSIS — I222 Subsequent non-ST elevation (NSTEMI) myocardial infarction: Secondary | ICD-10-CM | POA: Diagnosis not present

## 2015-03-09 DIAGNOSIS — M810 Age-related osteoporosis without current pathological fracture: Secondary | ICD-10-CM | POA: Diagnosis not present

## 2015-03-09 DIAGNOSIS — I4891 Unspecified atrial fibrillation: Secondary | ICD-10-CM | POA: Diagnosis not present

## 2015-03-10 ENCOUNTER — Ambulatory Visit (INDEPENDENT_AMBULATORY_CARE_PROVIDER_SITE_OTHER): Payer: Medicare Other | Admitting: *Deleted

## 2015-03-10 DIAGNOSIS — Z5181 Encounter for therapeutic drug level monitoring: Secondary | ICD-10-CM

## 2015-03-10 DIAGNOSIS — I4891 Unspecified atrial fibrillation: Secondary | ICD-10-CM

## 2015-03-10 LAB — POCT INR: INR: 1.4

## 2015-03-15 ENCOUNTER — Telehealth: Payer: Self-pay | Admitting: *Deleted

## 2015-03-15 DIAGNOSIS — I222 Subsequent non-ST elevation (NSTEMI) myocardial infarction: Secondary | ICD-10-CM | POA: Diagnosis not present

## 2015-03-15 DIAGNOSIS — M810 Age-related osteoporosis without current pathological fracture: Secondary | ICD-10-CM | POA: Diagnosis not present

## 2015-03-15 DIAGNOSIS — I2510 Atherosclerotic heart disease of native coronary artery without angina pectoris: Secondary | ICD-10-CM | POA: Diagnosis not present

## 2015-03-15 DIAGNOSIS — E785 Hyperlipidemia, unspecified: Secondary | ICD-10-CM | POA: Diagnosis not present

## 2015-03-15 DIAGNOSIS — I10 Essential (primary) hypertension: Secondary | ICD-10-CM | POA: Diagnosis not present

## 2015-03-15 DIAGNOSIS — G4733 Obstructive sleep apnea (adult) (pediatric): Secondary | ICD-10-CM | POA: Diagnosis not present

## 2015-03-15 DIAGNOSIS — I4891 Unspecified atrial fibrillation: Secondary | ICD-10-CM | POA: Diagnosis not present

## 2015-03-15 DIAGNOSIS — E039 Hypothyroidism, unspecified: Secondary | ICD-10-CM | POA: Diagnosis not present

## 2015-03-15 NOTE — Telephone Encounter (Signed)
Faxed signed order for home health certification and plan of care, plan of treatment

## 2015-03-17 ENCOUNTER — Ambulatory Visit (INDEPENDENT_AMBULATORY_CARE_PROVIDER_SITE_OTHER): Payer: Medicare Other | Admitting: *Deleted

## 2015-03-17 DIAGNOSIS — E039 Hypothyroidism, unspecified: Secondary | ICD-10-CM | POA: Diagnosis not present

## 2015-03-17 DIAGNOSIS — I222 Subsequent non-ST elevation (NSTEMI) myocardial infarction: Secondary | ICD-10-CM | POA: Diagnosis not present

## 2015-03-17 DIAGNOSIS — E785 Hyperlipidemia, unspecified: Secondary | ICD-10-CM | POA: Diagnosis not present

## 2015-03-17 DIAGNOSIS — I4891 Unspecified atrial fibrillation: Secondary | ICD-10-CM | POA: Diagnosis not present

## 2015-03-17 DIAGNOSIS — I2510 Atherosclerotic heart disease of native coronary artery without angina pectoris: Secondary | ICD-10-CM | POA: Diagnosis not present

## 2015-03-17 DIAGNOSIS — Z5181 Encounter for therapeutic drug level monitoring: Secondary | ICD-10-CM

## 2015-03-17 DIAGNOSIS — G4733 Obstructive sleep apnea (adult) (pediatric): Secondary | ICD-10-CM | POA: Diagnosis not present

## 2015-03-17 DIAGNOSIS — I10 Essential (primary) hypertension: Secondary | ICD-10-CM | POA: Diagnosis not present

## 2015-03-17 DIAGNOSIS — M810 Age-related osteoporosis without current pathological fracture: Secondary | ICD-10-CM | POA: Diagnosis not present

## 2015-03-17 LAB — POCT INR: INR: 2

## 2015-03-20 DIAGNOSIS — I10 Essential (primary) hypertension: Secondary | ICD-10-CM | POA: Diagnosis not present

## 2015-03-20 DIAGNOSIS — I4891 Unspecified atrial fibrillation: Secondary | ICD-10-CM | POA: Diagnosis not present

## 2015-03-20 DIAGNOSIS — I2510 Atherosclerotic heart disease of native coronary artery without angina pectoris: Secondary | ICD-10-CM | POA: Diagnosis not present

## 2015-03-20 DIAGNOSIS — G4733 Obstructive sleep apnea (adult) (pediatric): Secondary | ICD-10-CM | POA: Diagnosis not present

## 2015-03-20 DIAGNOSIS — M810 Age-related osteoporosis without current pathological fracture: Secondary | ICD-10-CM | POA: Diagnosis not present

## 2015-03-20 DIAGNOSIS — I222 Subsequent non-ST elevation (NSTEMI) myocardial infarction: Secondary | ICD-10-CM | POA: Diagnosis not present

## 2015-03-20 DIAGNOSIS — E785 Hyperlipidemia, unspecified: Secondary | ICD-10-CM | POA: Diagnosis not present

## 2015-03-20 DIAGNOSIS — E039 Hypothyroidism, unspecified: Secondary | ICD-10-CM | POA: Diagnosis not present

## 2015-03-23 ENCOUNTER — Other Ambulatory Visit: Payer: Self-pay

## 2015-03-23 DIAGNOSIS — E785 Hyperlipidemia, unspecified: Secondary | ICD-10-CM | POA: Diagnosis not present

## 2015-03-23 DIAGNOSIS — M810 Age-related osteoporosis without current pathological fracture: Secondary | ICD-10-CM | POA: Diagnosis not present

## 2015-03-23 DIAGNOSIS — I4891 Unspecified atrial fibrillation: Secondary | ICD-10-CM | POA: Diagnosis not present

## 2015-03-23 DIAGNOSIS — E039 Hypothyroidism, unspecified: Secondary | ICD-10-CM | POA: Diagnosis not present

## 2015-03-23 DIAGNOSIS — G4733 Obstructive sleep apnea (adult) (pediatric): Secondary | ICD-10-CM | POA: Diagnosis not present

## 2015-03-23 DIAGNOSIS — I222 Subsequent non-ST elevation (NSTEMI) myocardial infarction: Secondary | ICD-10-CM | POA: Diagnosis not present

## 2015-03-23 DIAGNOSIS — I10 Essential (primary) hypertension: Secondary | ICD-10-CM | POA: Diagnosis not present

## 2015-03-23 DIAGNOSIS — I2510 Atherosclerotic heart disease of native coronary artery without angina pectoris: Secondary | ICD-10-CM | POA: Diagnosis not present

## 2015-03-23 MED ORDER — ATORVASTATIN CALCIUM 80 MG PO TABS: 80.0000 mg | ORAL_TABLET | Freq: Every day | ORAL | Status: DC

## 2015-03-23 MED ORDER — PANTOPRAZOLE SODIUM 40 MG PO TBEC: 40.0000 mg | DELAYED_RELEASE_TABLET | Freq: Every day | ORAL | Status: DC

## 2015-03-23 MED ORDER — CLOPIDOGREL BISULFATE 75 MG PO TABS: 75.0000 mg | ORAL_TABLET | Freq: Every day | ORAL | Status: DC

## 2015-03-23 MED ORDER — AMLODIPINE BESYLATE 5 MG PO TABS: 5.0000 mg | ORAL_TABLET | Freq: Every day | ORAL | Status: DC

## 2015-03-23 MED ORDER — LISINOPRIL 5 MG PO TABS: 5.0000 mg | ORAL_TABLET | Freq: Every day | ORAL | Status: DC

## 2015-03-27 ENCOUNTER — Ambulatory Visit (INDEPENDENT_AMBULATORY_CARE_PROVIDER_SITE_OTHER): Payer: Medicare Other | Admitting: Pharmacist

## 2015-03-27 DIAGNOSIS — I4891 Unspecified atrial fibrillation: Secondary | ICD-10-CM | POA: Diagnosis not present

## 2015-03-27 DIAGNOSIS — Z5181 Encounter for therapeutic drug level monitoring: Secondary | ICD-10-CM | POA: Diagnosis not present

## 2015-03-27 LAB — POCT INR: INR: 2.3

## 2015-03-31 ENCOUNTER — Other Ambulatory Visit: Payer: Self-pay | Admitting: Family Medicine

## 2015-04-04 ENCOUNTER — Ambulatory Visit (INDEPENDENT_AMBULATORY_CARE_PROVIDER_SITE_OTHER): Payer: Medicare Other | Admitting: Family Medicine

## 2015-04-04 ENCOUNTER — Encounter: Payer: Self-pay | Admitting: Family Medicine

## 2015-04-04 VITALS — BP 110/62 | HR 74 | Temp 98.4°F | Resp 18 | Ht <= 58 in | Wt 173.0 lb

## 2015-04-04 DIAGNOSIS — B070 Plantar wart: Secondary | ICD-10-CM | POA: Diagnosis not present

## 2015-04-04 DIAGNOSIS — M722 Plantar fascial fibromatosis: Secondary | ICD-10-CM

## 2015-04-04 DIAGNOSIS — H2513 Age-related nuclear cataract, bilateral: Secondary | ICD-10-CM | POA: Diagnosis not present

## 2015-04-04 NOTE — Progress Notes (Signed)
Subjective:    Patient ID: [name], female    DOB: January 26, 1933, 81 y.o.   MRN: [id number]  HPI Please see my office visit from November. The pain in the patient's left foot has returned. On examination she is tender to palpation on the plantar aspect of the calcaneus. There is a palpable bone spur on exam. She is tender at the insertion of the plantar's fascia. However there is also now a 1 cm plantar's wart in that same area. There is thick hyperkeratotic skin. There are small capillary hemorrhages in the center of the callus. It appears that the patient has developed a plantar's wart in the exact same area where she has plantar fasciitis. Past Medical History  Diagnosis Date  . Hyperlipidemia   . Hypertension   . Hypothyroid   . Sleep apnea 04/2012    Mild/ AHI 10/CPAP 7cm h2o w/2 L o2  . Osteoporosis   . CAD (coronary artery disease)     a. 01/2015 NSTEMI/PCI: LM nl, LAD 30p, LCX nondom, 30-40p, 50d, OM1 100p (2.75x16 Rebel BMS), RCA mild diff plaque, EF 55%.  . A-fib     a. Dx 01/2015, CHA2DS2VASc = 5-->coumadin started.  . Candida infection, disseminated   . H/O echocardiogram     a. 01/2015 Echo: EF 50-55%, mild LVH, no rwma, mild MR, mildly dil LA, mod TR, PASP 53 mmHg.   Past Surgical History  Procedure Laterality Date  . Left heart catheterization with coronary angiogram N/A 02/20/2015    Procedure: LEFT HEART CATHETERIZATION WITH CORONARY ANGIOGRAM;  Surgeon: Jolaine Artist, MD;  Location: [facility] CATH LAB;  Service: Cardiovascular;  Laterality: N/A;  . Percutaneous coronary stent intervention (pci-s)  02/20/2015    Procedure: PERCUTANEOUS CORONARY STENT INTERVENTION (PCI-S);  Surgeon: Jolaine Artist, MD;  Location: [facility] CATH LAB;  Service: Cardiovascular;;  OM1  (2.75/65mm Rebel)   Current Outpatient Prescriptions on File Prior to Visit  Medication Sig Dispense Refill  . ALPRAZolam (XANAX) 0.25 MG tablet Take 0.25 mg by mouth at bedtime.    Marland Kitchen amLODipine (NORVASC) 10 MG  tablet TAKE 1 TABLET EVERY DAY 90 tablet 3  . amLODipine (NORVASC) 5 MG tablet Take 1 tablet (5 mg total) by mouth daily. 30 tablet 6  . atorvastatin (LIPITOR) 80 MG tablet Take 1 tablet (80 mg total) by mouth daily at 6 PM. 30 tablet 6  . calcium carbonate 1250 MG capsule Take 1,250 mg by mouth 2 (two) times daily with a meal.    . cholecalciferol (VITAMIN D) 1000 UNITS tablet Take 1,000 Units by mouth daily.    . clopidogrel (PLAVIX) 75 MG tablet Take 1 tablet (75 mg total) by mouth daily with breakfast. 30 tablet 6  . cyanocobalamin 1000 MCG tablet Take 100 mcg by mouth daily.    Marland Kitchen levothyroxine (SYNTHROID, LEVOTHROID) 137 MCG tablet Take 1 tablet (137 mcg total) by mouth daily. 90 tablet 3  . lisinopril (PRINIVIL,ZESTRIL) 5 MG tablet Take 1 tablet (5 mg total) by mouth daily. 30 tablet 6  . metoprolol (LOPRESSOR) 50 MG tablet Take 0.5 tablets (25 mg total) by mouth 2 (two) times daily. 30 tablet 5  . nitroGLYCERIN (NITROSTAT) 0.4 MG SL tablet Place 1 tablet (0.4 mg total) under the tongue every 5 (five) minutes x 3 doses as needed for chest pain. 25 tablet 3  . pantoprazole (PROTONIX) 40 MG tablet Take 1 tablet (40 mg total) by mouth daily. 30 tablet 6  . raloxifene (EVISTA) 60 MG  tablet Take 1 tablet (60 mg total) by mouth daily. 90 tablet 3  . traMADol (ULTRAM) 50 MG tablet Take 50 mg by mouth every 6 (six) hours as needed for moderate pain.    Marland Kitchen warfarin (COUMADIN) 1 MG tablet Take 1 tablet (1 mg total) by mouth daily. (Patient taking differently: Take 1 mg by mouth daily. 2 tabs po 4 days/week and 1 tab other days) 40 tablet 1   No current facility-administered medications on file prior to visit.   Allergies  Allergen Reactions  . Fosamax [Alendronate Sodium]    History   Social History  . Marital Status: Married    Spouse Name: N/A  . Number of Children: N/A  . Years of Education: N/A   Occupational History  . Not on file.   Social History Main Topics  . Smoking status:  Never Smoker   . Smokeless tobacco: Not on file  . Alcohol Use: Not on file  . Drug Use: Not on file  . Sexual Activity: Not on file   Other Topics Concern  . Not on file   Social History Narrative      Review of Systems  All other systems reviewed and are negative.      Objective:   Physical Exam  Cardiovascular: Normal rate and regular rhythm.   Pulmonary/Chest: Effort normal and breath sounds normal.  Vitals reviewed. Patient is tender to palpation on the left calcaneus at the insertion of the plantar's fascia. In close proximity to this area is a 6 mm to 10 mm hyperkeratotic callus with capillary hemorrhages consistent with a plantar's wart        Assessment & Plan:  Plantar wart of left foot  Plantar fasciitis of left foot  Using a razor blade, I performed a shave excision of the hyperkeratotic papule/callus down to healthy skin. The patient did sustain a small 2 mm superficial cut chloride did this which bled minimally. After excising the plantar's wart, using sterile technique, I injected the insertion of the plantar's fascia with a mixture of 1 mL of 0.1% lidocaine and 1 mL of 40 mg per mL Kenalog. The patient tolerated the procedure well. I then covered the area with a small piece of petroleum gauze, a piece of 2 x 2 gauze, and then wrapped the foot in Coban.

## 2015-04-10 ENCOUNTER — Other Ambulatory Visit: Payer: Self-pay | Admitting: Interventional Cardiology

## 2015-04-11 ENCOUNTER — Other Ambulatory Visit: Payer: Self-pay

## 2015-04-11 NOTE — Telephone Encounter (Signed)
Refill sent electronically on 04/10/15, receipt from pharmacy states they received rx at 3:43pm.  Advised pt's husband to call pharmacy to verify rx ready for pick up.

## 2015-04-17 ENCOUNTER — Ambulatory Visit (INDEPENDENT_AMBULATORY_CARE_PROVIDER_SITE_OTHER): Payer: Medicare Other | Admitting: *Deleted

## 2015-04-17 DIAGNOSIS — Z5181 Encounter for therapeutic drug level monitoring: Secondary | ICD-10-CM | POA: Diagnosis not present

## 2015-04-17 DIAGNOSIS — I4891 Unspecified atrial fibrillation: Secondary | ICD-10-CM

## 2015-04-17 LAB — POCT INR: INR: 1.3

## 2015-04-19 ENCOUNTER — Other Ambulatory Visit: Payer: Self-pay | Admitting: Family Medicine

## 2015-04-19 NOTE — Telephone Encounter (Signed)
Ok to refill??  Last office visit 04/05/2015.  Last refill 10/24/2014, #1 refill.

## 2015-04-20 NOTE — Telephone Encounter (Signed)
ok 

## 2015-04-24 ENCOUNTER — Ambulatory Visit (INDEPENDENT_AMBULATORY_CARE_PROVIDER_SITE_OTHER): Payer: Medicare Other | Admitting: *Deleted

## 2015-04-24 DIAGNOSIS — Z5181 Encounter for therapeutic drug level monitoring: Secondary | ICD-10-CM | POA: Diagnosis not present

## 2015-04-24 DIAGNOSIS — I4891 Unspecified atrial fibrillation: Secondary | ICD-10-CM | POA: Diagnosis not present

## 2015-04-24 LAB — POCT INR: INR: 1.5

## 2015-04-28 ENCOUNTER — Ambulatory Visit (INDEPENDENT_AMBULATORY_CARE_PROVIDER_SITE_OTHER): Payer: Medicare Other | Admitting: Internal Medicine

## 2015-04-28 ENCOUNTER — Encounter: Payer: Self-pay | Admitting: Internal Medicine

## 2015-04-28 ENCOUNTER — Other Ambulatory Visit: Payer: Self-pay | Admitting: Interventional Cardiology

## 2015-04-28 VITALS — BP 104/64 | HR 70 | Ht 62.0 in | Wt 164.4 lb

## 2015-04-28 DIAGNOSIS — I4891 Unspecified atrial fibrillation: Secondary | ICD-10-CM

## 2015-04-28 DIAGNOSIS — E785 Hyperlipidemia, unspecified: Secondary | ICD-10-CM

## 2015-04-28 DIAGNOSIS — I209 Angina pectoris, unspecified: Secondary | ICD-10-CM

## 2015-04-28 DIAGNOSIS — I10 Essential (primary) hypertension: Secondary | ICD-10-CM

## 2015-04-28 DIAGNOSIS — I2510 Atherosclerotic heart disease of native coronary artery without angina pectoris: Secondary | ICD-10-CM | POA: Diagnosis not present

## 2015-04-28 MED ORDER — NYSTATIN 100000 UNIT/GM EX POWD: Freq: Two times a day (BID) | CUTANEOUS | Status: DC

## 2015-04-28 NOTE — Patient Instructions (Signed)
Dr. Debara Pickett has prescribed nystatin powder to apply twice daily for 2 weeks to affected areas.   Dr. Debara Pickett has ordered a cardioversion - hopefully to be done in about 1 month You will need to have weekly INR checks - to make sure your coumadin level is therapeutic or in range.  We will contact you about setting this up - you will need to have lab work done before the procedure but we will let you know when.   Dr. Debara Pickett recommends you follow up with your primary care provider.

## 2015-04-30 NOTE — Progress Notes (Signed)
OFFICE NOTE  Chief Complaint:  Hospital follow-up  Primary Care Physician: Carolyn Fraction, MD  HPI:  [name] is an 81 yo female with a prior history of hypertension, hyperlipidemia, hypothyroidism, sleep apnea, and osteoporosis in the setting of chronic steroid therapy. She was recently admitted to [facility] with severe chest and back discomfort. EKG showed rate-controlled atrial fibrillation which was a new diagnosis. She ruled in for MI. Echo showed normal LV function. Cath revealed an occluded obtuse marginal branch of the left circumflex with other wise nonobstructive disease and normal LV function. The obtuse marginal was successfully stented using a 2.75 x 16 mm Rebel BMS. She did well postprocedure. With new onset A. fib, she was placed on Coumadin with a plan to continue aspirin, Plavix, and warfarin for 1 month and then to discontinue aspirin at that time.  Since discharge, she has done well. She has not had any chest pain or dyspnea. She says that she has been taking it fairly easy because she was.  She denies chest pain, palpitations, dyspnea, pnd, orthopnea, n, v, dizziness, syncope, edema, weight gain, or early satiety.  Right groin catheterization site has been healing well. She was noted to have significant tibial infection with sores under both breasts. She was given Diflucan in the hospital but no further treatment was initiated. She also had new onset atrial fibrillation and has been on warfarin. There've been no attempts at cardioversion.  PMHx:  Past Medical History  Diagnosis Date  . Hyperlipidemia   . Hypertension   . Hypothyroid   . Sleep apnea 04/2012    Mild/ AHI 10/CPAP 7cm h2o w/2 L o2  . Osteoporosis   . CAD (coronary artery disease)     a. 01/2015 NSTEMI/PCI: LM nl, LAD 30p, LCX nondom, 30-40p, 50d, OM1 100p (2.75x16 Rebel BMS), RCA mild diff plaque, EF 55%.  . A-fib     a. Dx 01/2015, CHA2DS2VASc = 5-->coumadin started.  . Candida infection,  disseminated   . H/O echocardiogram     a. 01/2015 Echo: EF 50-55%, mild LVH, no rwma, mild MR, mildly dil LA, mod TR, PASP 53 mmHg.    Past Surgical History  Procedure Laterality Date  . Left heart catheterization with coronary angiogram N/A 02/20/2015    Procedure: LEFT HEART CATHETERIZATION WITH CORONARY ANGIOGRAM;  Surgeon: Jolaine Artist, MD;  Location: [facility] CATH LAB;  Service: Cardiovascular;  Laterality: N/A;  . Percutaneous coronary stent intervention (pci-s)  02/20/2015    Procedure: PERCUTANEOUS CORONARY STENT INTERVENTION (PCI-S);  Surgeon: Jolaine Artist, MD;  Location: [facility] CATH LAB;  Service: Cardiovascular;;  OM1  (2.75/55mm Rebel)    FAMHx:  Family History  Problem Relation Age of Onset  . Coronary artery disease Father   . Diabetes Father   . Stroke Mother   . Arthritis Mother     SOCHx:   reports that she quit smoking about 20 years ago. She has never used smokeless tobacco. She reports that she does not drink alcohol or use illicit drugs.  ALLERGIES:  Allergies  Allergen Reactions  . Fosamax [Alendronate Sodium]     ROS: A comprehensive review of systems was negative except for: Integument/breast: positive for rash and skin lesion(s)  HOME MEDS: Current Outpatient Prescriptions  Medication Sig Dispense Refill  . ALPRAZolam (XANAX) 0.25 MG tablet Take 0.25 mg by mouth at bedtime.    Marland Kitchen amLODipine (NORVASC) 5 MG tablet Take 1 tablet (5 mg total) by mouth daily. 30 tablet  6  . atorvastatin (LIPITOR) 80 MG tablet Take 1 tablet (80 mg total) by mouth daily at 6 PM. 30 tablet 6  . calcium carbonate 1250 MG capsule Take 1,250 mg by mouth 2 (two) times daily with a meal.    . cholecalciferol (VITAMIN D) 1000 UNITS tablet Take 1,000 Units by mouth daily.    . clopidogrel (PLAVIX) 75 MG tablet Take 1 tablet (75 mg total) by mouth daily with breakfast. 30 tablet 6  . cyanocobalamin 1000 MCG tablet Take 100 mcg by mouth daily.    Marland Kitchen levothyroxine (SYNTHROID,  LEVOTHROID) 137 MCG tablet Take 1 tablet (137 mcg total) by mouth daily. 90 tablet 3  . lisinopril (PRINIVIL,ZESTRIL) 5 MG tablet Take 1 tablet (5 mg total) by mouth daily. 30 tablet 6  . metoprolol (LOPRESSOR) 50 MG tablet Take 0.5 tablets (25 mg total) by mouth 2 (two) times daily. 30 tablet 5  . nitroGLYCERIN (NITROSTAT) 0.4 MG SL tablet Place 1 tablet (0.4 mg total) under the tongue every 5 (five) minutes x 3 doses as needed for chest pain. 25 tablet 3  . nystatin (MYCOSTATIN) powder Apply topically 2 (two) times daily. For 2 weeks. 15 g 0  . pantoprazole (PROTONIX) 40 MG tablet Take 1 tablet (40 mg total) by mouth daily. 30 tablet 6  . raloxifene (EVISTA) 60 MG tablet Take 1 tablet (60 mg total) by mouth daily. 90 tablet 3  . traMADol (ULTRAM) 50 MG tablet TAKE 1 TABLET EVERY 6 HOURS AS NEEDED FOR PAIN 60 tablet 2  . warfarin (COUMADIN) 1 MG tablet TAKE 1 TABLET (1 MG TOTAL) BY MOUTH DAILY. 50 tablet 2   No current facility-administered medications for this visit.    LABS/IMAGING: No results found for this or any previous visit (from the past 48 hour(s)). No results found.  WEIGHTS: Wt Readings from Last 3 Encounters:  04/28/15 164 lb 6.4 oz (74.571 kg)  04/04/15 173 lb (78.472 kg)  02/27/15 171 lb 12.8 oz (77.928 kg)    VITALS: BP 104/64 mmHg  Pulse 70  Ht 5\' 2"  (1.575 m)  Wt 164 lb 6.4 oz (74.571 kg)  BMI 30.06 kg/m2  EXAM: General appearance: alert and no distress Lungs: clear to auscultation bilaterally Heart: irregularly irregular rhythm Abdomen: soft, non-tender; bowel sounds normal; no masses,  no organomegaly Skin: Well circumscribed quarter size red ringlike rash under both breasts with bright red punctate lesions  EKG: Atrial fibrillation at 78  ASSESSMENT: 1. NSTEMI-status post bare metal stent to obtuse marginal 2. New onset atrial fibrillation on warfarin 3. Dyslipidemia 4. Hypertension 5. Tineal rash under both breasts 6. Memory loss, question early  dementia  PLAN: 1.   [name] is doing well without complaints of angina. She does have new onset atrial fibrillation and remains in A. fib. She is on warfarin anticoagulation which she's been on for more than a month. I would recommend close monitoring of her INRs in the Coumadin clinic every week by a single provider to avoid confusion. She appears to have significant memory loss and some confusion and may have an early dementia. This will need to be followed up by her primary care provider. In order to make it easier for her to have compliance with warfarin, she will need frequent checks. I would also like to schedule her for cardioversion after one month of subsequent INRs which are therapeutic. Hopefully we can reestablish sinus rhythm. As previously mentioned she should continue on aspirin Plavix and warfarin only for  a total of 30 days, at which time she can go on to Plavix and warfarin. Finally, regarding her tibial infection I've given her nystatin powder to apply under each breast and hopefully this will help improve her symptoms. If this does not resolve, she may need to see a dermatologist.  Plan to see her back following cardioversion.  Pixie Casino, MD, [facility] Attending Cardiologist [place], [place] 04/30/2015, 4:17 PM

## 2015-04-30 NOTE — Progress Notes (Deleted)
Patient Name: [name] Date of Encounter: 04/30/2015  Primary Care Provider:  Odette Fraction, MD Primary Cardiologist:  C. Orissa Arreaga, MD   Chief Complaint  81 year old female status post recent hospitalization for non-ST elevation MI and A. fib, who presents for follow-up today.  Past Medical History   Past Medical History  Diagnosis Date  . Hyperlipidemia   . Hypertension   . Hypothyroid   . Sleep apnea 04/2012    Mild/ AHI 10/CPAP 7cm h2o w/2 L o2  . Osteoporosis   . CAD (coronary artery disease)     a. 01/2015 NSTEMI/PCI: LM nl, LAD 30p, LCX nondom, 30-40p, 50d, OM1 100p (2.75x16 Rebel BMS), RCA mild diff plaque, EF 55%.  . A-fib     a. Dx 01/2015, CHA2DS2VASc = 5-->coumadin started.  . Candida infection, disseminated   . H/O echocardiogram     a. 01/2015 Echo: EF 50-55%, mild LVH, no rwma, mild MR, mildly dil LA, mod TR, PASP 53 mmHg.   Past Surgical History  Procedure Laterality Date  . Left heart catheterization with coronary angiogram N/A 02/20/2015    Procedure: LEFT HEART CATHETERIZATION WITH CORONARY ANGIOGRAM;  Surgeon: Jolaine Artist, MD;  Location: [facility] CATH LAB;  Service: Cardiovascular;  Laterality: N/A;  . Percutaneous coronary stent intervention (pci-s)  02/20/2015    Procedure: PERCUTANEOUS CORONARY STENT INTERVENTION (PCI-S);  Surgeon: Jolaine Artist, MD;  Location: [facility] CATH LAB;  Service: Cardiovascular;;  OM1  (2.75/44mm Rebel)   Allergies  Allergies  Allergen Reactions  . Fosamax [Alendronate Sodium]    HPI  81 year old female with a prior history of hypertension, hyperlipidemia, hypothyroidism, sleep apnea, and osteoporosis in the setting of chronic steroid therapy. She was recently admitted to [facility] with severe chest and back discomfort. EKG showed rate-controlled atrial fibrillation which was a new diagnosis. She ruled in for MI. Echo showed normal LV function. Cath revealed an occluded obtuse marginal branch of the left circumflex  with other wise nonobstructive disease and normal LV function. The obtuse marginal was successfully stented using a 2.75 x 16 mm Rebel BMS. She did well postprocedure. With new onset A. fib, she was placed on Coumadin with a plan to continue aspirin, Plavix, and warfarin for 1 month and then to discontinue aspirin at that time.  Since discharge, she has done well. She has not had any chest pain or dyspnea. She says that she has been taking it fairly easy because she was.  She denies chest pain, palpitations, dyspnea, pnd, orthopnea, n, v, dizziness, syncope, edema, weight gain, or early satiety.  Right groin catheterization site has been healing well.  Ms. West has no new complaints today. Overall she is doing well and this had no further chest pain.  Home Medications  Prior to Admission medications   Medication Sig Start Date End Date Taking? Authorizing Provider  ALPRAZolam (XANAX) 0.25 MG tablet Take 0.25 mg by mouth at bedtime.   Yes Historical Provider, MD  amLODipine (NORVASC) 5 MG tablet Take 1 tablet (5 mg total) by mouth daily. 02/21/15  Yes Rogelia Mire, NP  aspirin 81 MG chewable tablet Chew 1 tablet (81 mg total) by mouth daily. 02/21/15 03/22/15 Yes Rogelia Mire, NP  atorvastatin (LIPITOR) 80 MG tablet Take 1 tablet (80 mg total) by mouth daily at 6 PM. 02/21/15  Yes Rogelia Mire, NP  calcium carbonate 1250 MG capsule Take 1,250 mg by mouth 2 (two) times daily with a meal.   Yes Historical  Provider, MD  cholecalciferol (VITAMIN D) 1000 UNITS tablet Take 1,000 Units by mouth daily.   Yes Historical Provider, MD  clopidogrel (PLAVIX) 75 MG tablet Take 1 tablet (75 mg total) by mouth daily with breakfast. 02/21/15  Yes Rogelia Mire, NP  cyanocobalamin 1000 MCG tablet Take 100 mcg by mouth daily.   Yes Historical Provider, MD  HYDROcodone-acetaminophen (NORCO/VICODIN) 5-325 MG per tablet Take 1 tablet by mouth every 6 (six) hours as needed for moderate pain.   Yes  Historical Provider, MD  levothyroxine (SYNTHROID, LEVOTHROID) 137 MCG tablet Take 1 tablet (137 mcg total) by mouth daily. 02/06/15  Yes [name], MD  lisinopril (PRINIVIL,ZESTRIL) 5 MG tablet Take 1 tablet (5 mg total) by mouth daily. 02/21/15  Yes Rogelia Mire, NP  metoprolol (LOPRESSOR) 50 MG tablet Take 0.5 tablets (25 mg total) by mouth 2 (two) times daily. 02/27/15  Yes Rogelia Mire, NP  nitroGLYCERIN (NITROSTAT) 0.4 MG SL tablet Place 1 tablet (0.4 mg total) under the tongue every 5 (five) minutes x 3 doses as needed for chest pain. 02/21/15  Yes Rogelia Mire, NP  pantoprazole (PROTONIX) 40 MG tablet Take 1 tablet (40 mg total) by mouth daily. 02/21/15  Yes Rogelia Mire, NP  raloxifene (EVISTA) 60 MG tablet Take 1 tablet (60 mg total) by mouth daily. 02/06/15  Yes [name], MD  traMADol (ULTRAM) 50 MG tablet Take 50 mg by mouth every 6 (six) hours as needed for moderate pain.   Yes Historical Provider, MD  warfarin (COUMADIN) 1 MG tablet Take 1 tablet (1 mg total) by mouth daily. 02/27/15   Jettie Booze, MD    Review of Systems  She denies chest pain, palpitations, dyspnea, pnd, orthopnea, n, v, dizziness, syncope, edema, weight gain, or early satiety.  All other systems reviewed and are otherwise negative except as noted above.  Physical Exam  VS:  BP 104/64 mmHg  Pulse 70  Ht 5\' 2"  (1.575 m)  Wt 164 lb 6.4 oz (74.571 kg)  BMI 30.06 kg/m2 , BMI Body mass index is 30.06 kg/(m^2). GEN: Well nourished, well developed, in no acute distress. HEENT: normal. Neck: Supple, no carotid bruits, or masses. Obese - difficult to gauge jvp. Cardiac: IR, IR, no murmurs, rubs, or gallops. No clubbing, cyanosis, edema.  Radials/DP/PT 2+ and equal bilaterally. R groin cath site w/o bleeding/bruit/hematoma. Respiratory:  Respirations regular and unlabored, clear to auscultation bilaterally. GI: Soft, nontender, nondistended, BS + x 4. MS: no deformity or  atrophy. Skin: warm and dry, no rash. Neuro:  Strength and sensation are intact. Psych: Normal affect.  Accessory Clinical Findings  ECG - atrial fibrillation, 94, inferior infarct, poor R-wave progression, no acute ST or T changes.  Assessment & Plan  1.  Non-ST segment elevation myocardial infarction, subsequent episode of care/coronary artery disease: Patient is status post recent admission with non-STEMI requiring PCI and bare metal stent placement to the occluded obtuse marginal. She otherwise had nonobstructive disease and normal LV function. She's not been having any chest pain or dyspnea since discharge. She remains on aspirin, statin, Plavix, beta blocker, and ACE inhibitor therapy. She is currently only taking short acting metoprolol once daily. I recommended that she take 25 twice a day instead, especially in light of elevated heart rate today. We also discussed the plan for her anticoagulant therapy. She will discontinue aspirin following 30 days of therapy and from there continue Plavix and Coumadin going forward. She is not  sure if she will partake in cardiac rehabilitation but currently has home health PT coming out to the house.  2. Hypertension: Stable.  3. Hyperlipidemia: Continue statin therapy. LDL was 104 on February 20. She will require follow-up lipids and LFTs in 6 weeks.  4. Atrial fibrillation: This is a new diagnosis from her recent admission. She is asymptomatic and reasonably well rate controlled though there is room for improvement. I have increased her metoprolol to 25 mg twice a day. She had previously been taking short-acting metoprolol daily only. She remains on warfarin and is followed in Coumadin clinic.  No changes to her medications at this time. Plan to see her back in 6 months.  Pixie Casino, MD, [facility] Attending Cardiologist [place], [place] 04/30/2015, 4:15 PM

## 2015-05-01 ENCOUNTER — Telehealth: Payer: Self-pay | Admitting: *Deleted

## 2015-05-01 NOTE — Telephone Encounter (Signed)
LMTCB  - switch INR checks to [facility]

## 2015-05-02 ENCOUNTER — Telehealth: Payer: Self-pay | Admitting: Internal Medicine

## 2015-05-02 NOTE — Telephone Encounter (Signed)
Pt. Informed about Coumadin checks here @ the [facility]

## 2015-05-02 NOTE — Telephone Encounter (Signed)
Pt's husband is returning Jenna's call from yesterday. He is unsure of what the call was in regards to. Please call back

## 2015-05-04 ENCOUNTER — Ambulatory Visit (INDEPENDENT_AMBULATORY_CARE_PROVIDER_SITE_OTHER): Payer: Medicare Other | Admitting: *Deleted

## 2015-05-04 DIAGNOSIS — I4891 Unspecified atrial fibrillation: Secondary | ICD-10-CM | POA: Diagnosis not present

## 2015-05-04 DIAGNOSIS — Z5181 Encounter for therapeutic drug level monitoring: Secondary | ICD-10-CM

## 2015-05-04 LAB — POCT INR: INR: 1.4

## 2015-05-08 ENCOUNTER — Telehealth: Payer: Self-pay | Admitting: Internal Medicine

## 2015-05-08 MED ORDER — WARFARIN SODIUM 1 MG PO TABS: ORAL_TABLET | ORAL | Status: DC

## 2015-05-08 NOTE — Telephone Encounter (Signed)
°  1. Which medications need to be refilled? Warfarin-dose have been changed,need new prescription 2. Which pharmacy is medication to be sent to?[phone]  3. Do they need a 30 day or 90 day supply? 90 and refills  4. Would they like a call back once the medication has been sent to the pharmacy? no

## 2015-05-11 ENCOUNTER — Ambulatory Visit (INDEPENDENT_AMBULATORY_CARE_PROVIDER_SITE_OTHER): Payer: Medicare Other | Admitting: *Deleted

## 2015-05-11 DIAGNOSIS — Z5181 Encounter for therapeutic drug level monitoring: Secondary | ICD-10-CM

## 2015-05-11 DIAGNOSIS — I4891 Unspecified atrial fibrillation: Secondary | ICD-10-CM | POA: Diagnosis not present

## 2015-05-11 LAB — POCT INR: INR: 1.7

## 2015-05-12 ENCOUNTER — Other Ambulatory Visit: Payer: Self-pay | Admitting: Family Medicine

## 2015-05-12 NOTE — Telephone Encounter (Signed)
Refill appropriate and filled per protocol. 

## 2015-05-14 ENCOUNTER — Other Ambulatory Visit: Payer: Self-pay | Admitting: Family Medicine

## 2015-05-16 ENCOUNTER — Ambulatory Visit (INDEPENDENT_AMBULATORY_CARE_PROVIDER_SITE_OTHER): Payer: Medicare Other | Admitting: Family Medicine

## 2015-05-16 ENCOUNTER — Encounter: Payer: Self-pay | Admitting: Family Medicine

## 2015-05-16 VITALS — BP 130/80 | HR 60 | Temp 98.4°F | Resp 19 | Wt 166.0 lb

## 2015-05-16 DIAGNOSIS — R413 Other amnesia: Secondary | ICD-10-CM | POA: Diagnosis not present

## 2015-05-16 LAB — VITAMIN B12: Vitamin B-12: 583 pg/mL (ref 211–911)

## 2015-05-16 LAB — TSH: TSH: 1.335 u[IU]/mL (ref 0.350–4.500)

## 2015-05-16 MED ORDER — DONEPEZIL HCL 5 MG PO TABS: 5.0000 mg | ORAL_TABLET | Freq: Every day | ORAL | Status: DC

## 2015-05-16 NOTE — Progress Notes (Signed)
Subjective:    Patient ID: [name], female    DOB: January 29, 1933, 81 y.o.   MRN: [id number]  HPI Patient was referred here by her cardiologist due to aggressive memory problems. Patient states that the memory problems began in earnest in December. She is having a difficult time locating items in her home and in her kitchen. She has a difficult time remembering things that are told to her. For instance she cannot remember why she was here. I had to read her office visits from her cardiologist to understand their concerns. I performed a Mini-Mental status exam today. The patient is unsure of the date. She is able to name the month and the year but not the day. She scored 4 out of 5 in this area. She scored 5 out of 5 in location. She is able to recall only 2 out of 3 objects. She was unable to spell backwards. Furthermore she cannot perform serial sevens. Therefore she scored 23 out of 30. Past Medical History  Diagnosis Date  . Hyperlipidemia   . Hypertension   . Hypothyroid   . Sleep apnea 04/2012    Mild/ AHI 10/CPAP 7cm h2o w/2 L o2  . Osteoporosis   . CAD (coronary artery disease)     a. 01/2015 NSTEMI/PCI: LM nl, LAD 30p, LCX nondom, 30-40p, 50d, OM1 100p (2.75x16 Rebel BMS), RCA mild diff plaque, EF 55%.  . A-fib     a. Dx 01/2015, CHA2DS2VASc = 5-->coumadin started.  . Candida infection, disseminated   . H/O echocardiogram     a. 01/2015 Echo: EF 50-55%, mild LVH, no rwma, mild MR, mildly dil LA, mod TR, PASP 53 mmHg.   Past Surgical History  Procedure Laterality Date  . Left heart catheterization with coronary angiogram N/A 02/20/2015    Procedure: LEFT HEART CATHETERIZATION WITH CORONARY ANGIOGRAM;  Surgeon: Jolaine Artist, MD;  Location: [facility] CATH LAB;  Service: Cardiovascular;  Laterality: N/A;  . Percutaneous coronary stent intervention (pci-s)  02/20/2015    Procedure: PERCUTANEOUS CORONARY STENT INTERVENTION (PCI-S);  Surgeon: Jolaine Artist, MD;  Location: [facility] CATH  LAB;  Service: Cardiovascular;;  OM1  (2.75/55mm Rebel)   Current Outpatient Prescriptions on File Prior to Visit  Medication Sig Dispense Refill  . amLODipine (NORVASC) 5 MG tablet Take 1 tablet (5 mg total) by mouth daily. 30 tablet 6  . atorvastatin (LIPITOR) 80 MG tablet Take 1 tablet (80 mg total) by mouth daily at 6 PM. 30 tablet 6  . calcium carbonate 1250 MG capsule Take 1,250 mg by mouth 2 (two) times daily with a meal.    . cholecalciferol (VITAMIN D) 1000 UNITS tablet Take 1,000 Units by mouth daily.    . clopidogrel (PLAVIX) 75 MG tablet Take 1 tablet (75 mg total) by mouth daily with breakfast. 30 tablet 6  . levothyroxine (SYNTHROID, LEVOTHROID) 137 MCG tablet TAKE 1 TABLET EVERY DAY 90 tablet 0  . lisinopril (PRINIVIL,ZESTRIL) 5 MG tablet Take 1 tablet (5 mg total) by mouth daily. 30 tablet 6  . metoprolol (LOPRESSOR) 50 MG tablet Take 0.5 tablets (25 mg total) by mouth 2 (two) times daily. 30 tablet 5  . pantoprazole (PROTONIX) 40 MG tablet Take 1 tablet (40 mg total) by mouth daily. 30 tablet 6  . raloxifene (EVISTA) 60 MG tablet Take 1 tablet (60 mg total) by mouth daily. 90 tablet 3  . raloxifene (EVISTA) 60 MG tablet TAKE 1 TABLET (60 MG TOTAL) BY MOUTH DAILY. 90 tablet  3  . traMADol (ULTRAM) 50 MG tablet TAKE 1 TABLET EVERY 6 HOURS AS NEEDED FOR PAIN 60 tablet 2  . warfarin (COUMADIN) 1 MG tablet Take 2 tablets daily or as directed by coumadin clinic 180 tablet 1  . ALPRAZolam (XANAX) 0.25 MG tablet Take 0.25 mg by mouth at bedtime.    . cyanocobalamin 1000 MCG tablet Take 100 mcg by mouth daily.    . nitroGLYCERIN (NITROSTAT) 0.4 MG SL tablet Place 1 tablet (0.4 mg total) under the tongue every 5 (five) minutes x 3 doses as needed for chest pain. (Patient not taking: Reported on 05/16/2015) 25 tablet 3  . nystatin (MYCOSTATIN) powder Apply topically 2 (two) times daily. For 2 weeks. (Patient not taking: Reported on 05/16/2015) 15 g 0   No current facility-administered  medications on file prior to visit.   Allergies  Allergen Reactions  . Fosamax [Alendronate Sodium]    History   Social History  . Marital Status: Married    Spouse Name: N/A  . Number of Children: N/A  . Years of Education: N/A   Occupational History  . Not on file.   Social History Main Topics  . Smoking status: Former Smoker    Quit date: 04/28/1995  . Smokeless tobacco: Never Used  . Alcohol Use: No  . Drug Use: No  . Sexual Activity: Not on file   Other Topics Concern  . Not on file   Social History Narrative      Review of Systems  All other systems reviewed and are negative.      Objective:   Physical Exam  Constitutional: She is oriented to person, place, and time.  Cardiovascular: Normal rate and normal heart sounds.   Pulmonary/Chest: Effort normal and breath sounds normal.  Neurological: She is alert and oriented to person, place, and time. She has normal reflexes. She displays normal reflexes. No cranial nerve deficit. She exhibits normal muscle tone. Coordination normal.  Vitals reviewed.         Assessment & Plan:  Memory loss - Plan: TSH, Vitamin B12, donepezil (ARICEPT) 5 MG tablet  I will check a TSH and vitamin B12 to look for other reversible causes of memory loss. Meanwhile start the patient on Aricept 5 mg by mouth daily for memory loss and early dementia. I will recheck in 6 weeks. I will increase the dose as tolerated and even consider Namenda depending upon the patient's progression.

## 2015-05-18 ENCOUNTER — Ambulatory Visit (INDEPENDENT_AMBULATORY_CARE_PROVIDER_SITE_OTHER): Payer: Medicare Other | Admitting: *Deleted

## 2015-05-18 DIAGNOSIS — I4891 Unspecified atrial fibrillation: Secondary | ICD-10-CM

## 2015-05-18 DIAGNOSIS — Z5181 Encounter for therapeutic drug level monitoring: Secondary | ICD-10-CM

## 2015-05-18 LAB — POCT INR: INR: 1.9

## 2015-05-26 ENCOUNTER — Ambulatory Visit (INDEPENDENT_AMBULATORY_CARE_PROVIDER_SITE_OTHER): Payer: Medicare Other | Admitting: *Deleted

## 2015-05-26 ENCOUNTER — Telehealth: Payer: Self-pay | Admitting: Family Medicine

## 2015-05-26 DIAGNOSIS — Z5181 Encounter for therapeutic drug level monitoring: Secondary | ICD-10-CM

## 2015-05-26 DIAGNOSIS — I4891 Unspecified atrial fibrillation: Secondary | ICD-10-CM | POA: Diagnosis not present

## 2015-05-26 LAB — POCT INR: INR: 3

## 2015-05-26 MED ORDER — DICLOFENAC SODIUM 1 % TD GEL: 4.0000 g | Freq: Four times a day (QID) | TRANSDERMAL | Status: DC

## 2015-05-26 NOTE — Telephone Encounter (Signed)
Medication called/sent to requested pharmacy  

## 2015-05-26 NOTE — Telephone Encounter (Signed)
ok 

## 2015-05-26 NOTE — Telephone Encounter (Signed)
[name]  Patient calling to see if she can get some volgaren gel called in for foot  [phone]

## 2015-05-26 NOTE — Telephone Encounter (Signed)
OK to send in for her? 

## 2015-05-30 ENCOUNTER — Telehealth: Payer: Self-pay | Admitting: Family Medicine

## 2015-05-30 NOTE — Telephone Encounter (Signed)
PA submitted through [email or web address]  

## 2015-05-30 NOTE — Telephone Encounter (Signed)
Pt is aware that PA was submitted and it can take up to 3 days to hear back from insurance

## 2015-05-30 NOTE — Telephone Encounter (Signed)
Patient wants to know if her refill for the voltaren gel was ever approved by her insurance [phone]

## 2015-05-31 MED ORDER — DICLOFENAC SODIUM 1 % TD GEL: 4.0000 g | Freq: Four times a day (QID) | TRANSDERMAL | Status: DC

## 2015-05-31 NOTE — Telephone Encounter (Signed)
PA approved through 05/29/16 - pharmacy aware

## 2015-06-07 ENCOUNTER — Ambulatory Visit (INDEPENDENT_AMBULATORY_CARE_PROVIDER_SITE_OTHER): Payer: Medicare Other | Admitting: *Deleted

## 2015-06-07 DIAGNOSIS — I4891 Unspecified atrial fibrillation: Secondary | ICD-10-CM

## 2015-06-07 DIAGNOSIS — Z5181 Encounter for therapeutic drug level monitoring: Secondary | ICD-10-CM

## 2015-06-07 LAB — POCT INR: INR: 3.3

## 2015-06-18 ENCOUNTER — Other Ambulatory Visit: Payer: Self-pay | Admitting: Family Medicine

## 2015-06-19 NOTE — Telephone Encounter (Signed)
?   OK to Refill  

## 2015-06-19 NOTE — Telephone Encounter (Signed)
rx called in

## 2015-06-19 NOTE — Telephone Encounter (Signed)
ok 

## 2015-06-21 ENCOUNTER — Ambulatory Visit (INDEPENDENT_AMBULATORY_CARE_PROVIDER_SITE_OTHER): Payer: Medicare Other | Admitting: *Deleted

## 2015-06-21 DIAGNOSIS — Z5181 Encounter for therapeutic drug level monitoring: Secondary | ICD-10-CM | POA: Diagnosis not present

## 2015-06-21 DIAGNOSIS — I4891 Unspecified atrial fibrillation: Secondary | ICD-10-CM | POA: Diagnosis not present

## 2015-06-21 LAB — POCT INR: INR: 3.5

## 2015-07-04 ENCOUNTER — Ambulatory Visit: Payer: Self-pay | Admitting: Family Medicine

## 2015-07-05 ENCOUNTER — Ambulatory Visit (INDEPENDENT_AMBULATORY_CARE_PROVIDER_SITE_OTHER): Payer: Medicare Other | Admitting: *Deleted

## 2015-07-05 DIAGNOSIS — I4891 Unspecified atrial fibrillation: Secondary | ICD-10-CM

## 2015-07-05 DIAGNOSIS — Z5181 Encounter for therapeutic drug level monitoring: Secondary | ICD-10-CM | POA: Diagnosis not present

## 2015-07-05 LAB — POCT INR: INR: 2.4

## 2015-07-11 ENCOUNTER — Ambulatory Visit (INDEPENDENT_AMBULATORY_CARE_PROVIDER_SITE_OTHER): Payer: Medicare Other | Admitting: Family Medicine

## 2015-07-11 ENCOUNTER — Encounter: Payer: Self-pay | Admitting: Family Medicine

## 2015-07-11 VITALS — BP 118/78 | HR 62 | Temp 98.4°F | Resp 18 | Ht 62.0 in | Wt 164.0 lb

## 2015-07-11 DIAGNOSIS — M810 Age-related osteoporosis without current pathological fracture: Secondary | ICD-10-CM | POA: Diagnosis not present

## 2015-07-11 DIAGNOSIS — Z0000 Encounter for general adult medical examination without abnormal findings: Secondary | ICD-10-CM | POA: Diagnosis not present

## 2015-07-11 MED ORDER — CLOTRIMAZOLE-BETAMETHASONE 1-0.05 % EX CREA: 1.0000 "application " | TOPICAL_CREAM | Freq: Two times a day (BID) | CUTANEOUS | Status: DC

## 2015-07-11 NOTE — Progress Notes (Signed)
Subjective:    Patient ID: [name], female    DOB: 11/03/1933, 81 y.o.   MRN: [id number]  HPI Subjective:   Patient presents for Medicare Annual/Subsequent preventive examination.   Review Past Medical/Family/Social: Past Medical History  Diagnosis Date  . Hyperlipidemia   . Hypertension   . Hypothyroid   . Sleep apnea 04/2012    Mild/ AHI 10/CPAP 7cm h2o w/2 L o2  . Osteoporosis   . CAD (coronary artery disease)     a. 01/2015 NSTEMI/PCI: LM nl, LAD 30p, LCX nondom, 30-40p, 50d, OM1 100p (2.75x16 Rebel BMS), RCA mild diff plaque, EF 55%.  . A-fib     a. Dx 01/2015, CHA2DS2VASc = 5-->coumadin started.  . Candida infection, disseminated   . H/O echocardiogram     a. 01/2015 Echo: EF 50-55%, mild LVH, no rwma, mild MR, mildly dil LA, mod TR, PASP 53 mmHg.   Past Surgical History  Procedure Laterality Date  . Left heart catheterization with coronary angiogram N/A 02/20/2015    Procedure: LEFT HEART CATHETERIZATION WITH CORONARY ANGIOGRAM;  Surgeon: Jolaine Artist, MD;  Location: [facility] CATH LAB;  Service: Cardiovascular;  Laterality: N/A;  . Percutaneous coronary stent intervention (pci-s)  02/20/2015    Procedure: PERCUTANEOUS CORONARY STENT INTERVENTION (PCI-S);  Surgeon: Jolaine Artist, MD;  Location: [facility] CATH LAB;  Service: Cardiovascular;;  OM1  (2.75/18mm Rebel)   Current Outpatient Prescriptions on File Prior to Visit  Medication Sig Dispense Refill  . ALPRAZolam (XANAX) 0.25 MG tablet TAKE 1 TABLET BY MOUTH AT BEDTIME MUST LAST 30 DAYS 30 tablet 0  . amLODipine (NORVASC) 5 MG tablet Take 1 tablet (5 mg total) by mouth daily. 30 tablet 6  . atorvastatin (LIPITOR) 80 MG tablet Take 1 tablet (80 mg total) by mouth daily at 6 PM. 30 tablet 6  . calcium carbonate 1250 MG capsule Take 1,250 mg by mouth 2 (two) times daily with a meal.    . cholecalciferol (VITAMIN D) 1000 UNITS tablet Take 1,000 Units by mouth daily.    . clopidogrel (PLAVIX) 75 MG tablet Take 1 tablet  (75 mg total) by mouth daily with breakfast. 30 tablet 6  . cyanocobalamin 1000 MCG tablet Take 100 mcg by mouth daily.    Marland Kitchen donepezil (ARICEPT) 5 MG tablet Take 1 tablet (5 mg total) by mouth at bedtime. 30 tablet 5  . levothyroxine (SYNTHROID, LEVOTHROID) 137 MCG tablet TAKE 1 TABLET EVERY DAY 90 tablet 0  . lisinopril (PRINIVIL,ZESTRIL) 5 MG tablet Take 1 tablet (5 mg total) by mouth daily. 30 tablet 6  . metoprolol (LOPRESSOR) 50 MG tablet Take 0.5 tablets (25 mg total) by mouth 2 (two) times daily. 30 tablet 5  . nitroGLYCERIN (NITROSTAT) 0.4 MG SL tablet Place 1 tablet (0.4 mg total) under the tongue every 5 (five) minutes x 3 doses as needed for chest pain. 25 tablet 3  . raloxifene (EVISTA) 60 MG tablet TAKE 1 TABLET (60 MG TOTAL) BY MOUTH DAILY. 90 tablet 3  . traMADol (ULTRAM) 50 MG tablet TAKE 1 TABLET EVERY 6 HOURS AS NEEDED FOR PAIN 60 tablet 2  . warfarin (COUMADIN) 1 MG tablet Take 2 tablets daily or as directed by coumadin clinic 180 tablet 1   No current facility-administered medications on file prior to visit.   Allergies  Allergen Reactions  . Fosamax [Alendronate Sodium]    History   Social History  . Marital Status: Married    Spouse Name: N/A  .  Number of Children: N/A  . Years of Education: N/A   Occupational History  . Not on file.   Social History Main Topics  . Smoking status: Former Smoker    Quit date: 04/28/1995  . Smokeless tobacco: Never Used  . Alcohol Use: No  . Drug Use: No  . Sexual Activity: Not on file   Other Topics Concern  . Not on file   Social History Narrative   Family History  Problem Relation Age of Onset  . Coronary artery disease Father   . Diabetes Father   . Stroke Mother   . Arthritis Mother      Depression Screen  (Note: if answer to either of the following is "Yes", a more complete depression screening is indicated)  Over the past two weeks, have you felt down, depressed or hopeless? No Over the past two weeks,  have you felt little interest or pleasure in doing things? No Have you lost interest or pleasure in daily life? No Do you often feel hopeless? No Do you cry easily over simple problems? No   Activities of Daily Living  In your present state of health, do you have any difficulty performing the following activities?:  Driving? No  Managing money? Yes  Feeding yourself? No  Getting from bed to chair? No  Climbing a flight of stairs? Yes  Preparing food and eating?: No  Bathing or showering? No  Getting dressed: No  Getting to the toilet? No  Using the toilet:No  Moving around from place to place: No  In the past year have you fallen or had a near fall?: Yes Are you sexually active? No  Do you have more than one partner? No   Hearing Difficulties: No  Do you often ask people to speak up or repeat themselves? No  Do you experience ringing or noises in your ears? No Do you have difficulty understanding soft or whispered voices? No  Do you feel that you have a problem with memory? No Do you often misplace items? No  Do you feel safe at home? Yes  Cognitive Testing  Mild dementia Alert? Yes Normal Appearance?Yes  Oriented to person? Yes Place? Yes  Time? Yes  Recall of three objects? 1/3 Can perform simple calculations? Yes  Displays appropriate judgment?Yes  Can read the correct time from a watch face?Yes   Screening Tests / Date Colonoscopy       Not recommended              Zostavax recommended to the patient today Mammogram we will schedule Influenza Vaccine not due until his fall Tetanus/tdap patient declined given the fact insurance will not cover       Review of Systems  All other systems reviewed and are negative.      Objective:   Physical Exam  Constitutional: She is oriented to person, place, and time. She appears well-developed and well-nourished. No distress.  HENT:  Head: Normocephalic and atraumatic.  Right Ear: External ear normal.  Left Ear:  External ear normal.  Nose: Nose normal.  Mouth/Throat: Oropharynx is clear and moist. No oropharyngeal exudate.  Eyes: Conjunctivae and EOM are normal. Pupils are equal, round, and reactive to light. Right eye exhibits no discharge. Left eye exhibits no discharge. No scleral icterus.  Neck: Normal range of motion. Neck supple. No JVD present. No tracheal deviation present. No thyromegaly present.  Cardiovascular: Normal rate and intact distal pulses.  An irregularly irregular rhythm present. Exam reveals no  gallop and no friction rub.   Murmur heard. Pulmonary/Chest: Effort normal and breath sounds normal. No stridor. No respiratory distress. She has no wheezes. She has no rales. She exhibits no tenderness.  Abdominal: Soft. Bowel sounds are normal. She exhibits no distension and no mass. There is no tenderness. There is no rebound and no guarding.  Musculoskeletal: Normal range of motion. She exhibits no edema or tenderness.  Lymphadenopathy:    She has no cervical adenopathy.  Neurological: She is alert and oriented to person, place, and time. She has normal reflexes. She displays normal reflexes. No cranial nerve deficit. She exhibits normal muscle tone. Coordination normal.  Skin: Skin is warm. No rash noted. She is not diaphoretic. No erythema. No pallor.  Psychiatric: She has a normal mood and affect. Her behavior is normal. Judgment and thought content normal.  Vitals reviewed.         Assessment & Plan:    Assessment:    Annual wellness medicare exam   Plan:    During the course of the visit the patient was educated and counseled about appropriate screening and preventive services including:  Screening mammography  Colorectal cancer screening not recommended due to age Shingles vaccine. Prescription given to that she can get the vaccine at the pharmacy or Medicare part D.   Medicare Attestation  I have personally reviewed:  The patient's medical and social history    Their use of alcohol, tobacco or illicit drugs  Their current medications and supplements  The patient's functional ability including ADLs,fall risks, home safety risks, cognitive, and hearing and visual impairment  Diet and physical activities  Evidence for depression or mood disorders  The patient's weight, height, BMI, and visual acuity have been recorded in the chart. I have made referrals, counseling, and provided education to the patient based on review of the above and I have provided the patient with a written personalized care plan for preventive services.   I have asked the patient to return fasting for a CBC, CMP, fasting lipid panel, and a TSH

## 2015-07-12 ENCOUNTER — Ambulatory Visit (INDEPENDENT_AMBULATORY_CARE_PROVIDER_SITE_OTHER): Payer: Medicare Other | Admitting: *Deleted

## 2015-07-12 DIAGNOSIS — Z5181 Encounter for therapeutic drug level monitoring: Secondary | ICD-10-CM

## 2015-07-12 DIAGNOSIS — I4891 Unspecified atrial fibrillation: Secondary | ICD-10-CM

## 2015-07-12 LAB — POCT INR: INR: 1.9

## 2015-07-18 ENCOUNTER — Encounter: Payer: Self-pay | Admitting: Family Medicine

## 2015-07-18 ENCOUNTER — Other Ambulatory Visit: Payer: Medicare Other

## 2015-07-18 DIAGNOSIS — I10 Essential (primary) hypertension: Secondary | ICD-10-CM | POA: Diagnosis not present

## 2015-07-18 DIAGNOSIS — E785 Hyperlipidemia, unspecified: Secondary | ICD-10-CM

## 2015-07-19 ENCOUNTER — Ambulatory Visit (INDEPENDENT_AMBULATORY_CARE_PROVIDER_SITE_OTHER): Payer: Medicare Other | Admitting: *Deleted

## 2015-07-19 DIAGNOSIS — Z5181 Encounter for therapeutic drug level monitoring: Secondary | ICD-10-CM

## 2015-07-19 DIAGNOSIS — I4891 Unspecified atrial fibrillation: Secondary | ICD-10-CM | POA: Diagnosis not present

## 2015-07-19 LAB — COMPLETE METABOLIC PANEL WITH GFR
ALBUMIN: 3.2 g/dL — AB (ref 3.5–5.2)
ALT: 9 U/L (ref 0–35)
AST: 14 U/L (ref 0–37)
Alkaline Phosphatase: 41 U/L (ref 39–117)
BILIRUBIN TOTAL: 1 mg/dL (ref 0.2–1.2)
BUN: 17 mg/dL (ref 6–23)
CHLORIDE: 105 meq/L (ref 96–112)
CO2: 25 meq/L (ref 19–32)
CREATININE: 0.75 mg/dL (ref 0.50–1.10)
Calcium: 8.9 mg/dL (ref 8.4–10.5)
GFR, EST NON AFRICAN AMERICAN: 76 mL/min
GFR, Est African American: 87 mL/min
GLUCOSE: 69 mg/dL — AB (ref 70–99)
Potassium: 3.9 mEq/L (ref 3.5–5.3)
Sodium: 143 mEq/L (ref 135–145)
Total Protein: 5.7 g/dL — ABNORMAL LOW (ref 6.0–8.3)

## 2015-07-19 LAB — CBC WITH DIFFERENTIAL/PLATELET
BASOS ABS: 0 10*3/uL (ref 0.0–0.1)
Basophils Relative: 0 % (ref 0–1)
Eosinophils Absolute: 0.1 10*3/uL (ref 0.0–0.7)
Eosinophils Relative: 2 % (ref 0–5)
HEMATOCRIT: 41.8 % (ref 36.0–46.0)
Hemoglobin: 13.4 g/dL (ref 12.0–15.0)
LYMPHS ABS: 1.7 10*3/uL (ref 0.7–4.0)
Lymphocytes Relative: 27 % (ref 12–46)
MCH: 29.5 pg (ref 26.0–34.0)
MCHC: 32.1 g/dL (ref 30.0–36.0)
MCV: 92.1 fL (ref 78.0–100.0)
MPV: 9.9 fL (ref 8.6–12.4)
Monocytes Absolute: 0.5 10*3/uL (ref 0.1–1.0)
Monocytes Relative: 8 % (ref 3–12)
NEUTROS PCT: 63 % (ref 43–77)
Neutro Abs: 3.9 10*3/uL (ref 1.7–7.7)
Platelets: 213 10*3/uL (ref 150–400)
RBC: 4.54 MIL/uL (ref 3.87–5.11)
RDW: 14 % (ref 11.5–15.5)
WBC: 6.2 10*3/uL (ref 4.0–10.5)

## 2015-07-19 LAB — LIPID PANEL
CHOL/HDL RATIO: 2.4 ratio
Cholesterol: 97 mg/dL (ref 0–200)
HDL: 40 mg/dL — ABNORMAL LOW (ref >=46)
LDL Cholesterol: 43 mg/dL (ref 0–99)
Triglycerides: 71 mg/dL (ref <150)
VLDL: 14 mg/dL (ref 0–40)

## 2015-07-19 LAB — POCT INR: INR: 1.8

## 2015-07-26 ENCOUNTER — Ambulatory Visit (INDEPENDENT_AMBULATORY_CARE_PROVIDER_SITE_OTHER): Payer: Medicare Other | Admitting: *Deleted

## 2015-07-26 ENCOUNTER — Ambulatory Visit
Admission: RE | Admit: 2015-07-26 | Discharge: 2015-07-26 | Disposition: A | Discharge status: Home / Self Care | Payer: Medicare Other | Source: Ambulatory Visit | Attending: Family Medicine | Admitting: Family Medicine

## 2015-07-26 ENCOUNTER — Other Ambulatory Visit: Payer: Self-pay | Admitting: Family Medicine

## 2015-07-26 DIAGNOSIS — Z1231 Encounter for screening mammogram for malignant neoplasm of breast: Secondary | ICD-10-CM | POA: Diagnosis not present

## 2015-07-26 DIAGNOSIS — Z5181 Encounter for therapeutic drug level monitoring: Secondary | ICD-10-CM

## 2015-07-26 DIAGNOSIS — M810 Age-related osteoporosis without current pathological fracture: Secondary | ICD-10-CM

## 2015-07-26 DIAGNOSIS — I4891 Unspecified atrial fibrillation: Secondary | ICD-10-CM | POA: Diagnosis not present

## 2015-07-26 DIAGNOSIS — Z0000 Encounter for general adult medical examination without abnormal findings: Secondary | ICD-10-CM

## 2015-07-26 LAB — POCT INR: INR: 3

## 2015-07-26 NOTE — Telephone Encounter (Signed)
Ok to refill??  Last office visit 07/11/2015.  Last refill 06/19/2015.

## 2015-07-27 NOTE — Telephone Encounter (Signed)
ok 

## 2015-07-27 NOTE — Telephone Encounter (Signed)
Medication refilled per protocol. 

## 2015-07-28 ENCOUNTER — Encounter: Payer: Self-pay | Admitting: Internal Medicine

## 2015-07-28 ENCOUNTER — Ambulatory Visit (INDEPENDENT_AMBULATORY_CARE_PROVIDER_SITE_OTHER): Payer: Medicare Other | Admitting: Internal Medicine

## 2015-07-28 VITALS — BP 128/80 | HR 82 | Ht <= 58 in | Wt 164.1 lb

## 2015-07-28 DIAGNOSIS — E785 Hyperlipidemia, unspecified: Secondary | ICD-10-CM | POA: Diagnosis not present

## 2015-07-28 DIAGNOSIS — I214 Non-ST elevation (NSTEMI) myocardial infarction: Secondary | ICD-10-CM

## 2015-07-28 DIAGNOSIS — I4819 Other persistent atrial fibrillation: Secondary | ICD-10-CM

## 2015-07-28 DIAGNOSIS — I2510 Atherosclerotic heart disease of native coronary artery without angina pectoris: Secondary | ICD-10-CM | POA: Diagnosis not present

## 2015-07-28 DIAGNOSIS — I10 Essential (primary) hypertension: Secondary | ICD-10-CM | POA: Diagnosis not present

## 2015-07-28 DIAGNOSIS — I481 Persistent atrial fibrillation: Secondary | ICD-10-CM

## 2015-07-28 DIAGNOSIS — I2583 Coronary atherosclerosis due to lipid rich plaque: Secondary | ICD-10-CM

## 2015-07-28 NOTE — Patient Instructions (Signed)
Your physician recommends that you schedule a follow-up appointment in 4-6 weeks with Dr. Debara Pickett.

## 2015-07-28 NOTE — Progress Notes (Signed)
OFFICE NOTE  Chief Complaint:  Follow-up a-fib  Primary Care Physician: Carolyn Fraction, MD  HPI:  [name] is an 81 yo female with a prior history of hypertension, hyperlipidemia, hypothyroidism, sleep apnea, and osteoporosis in the setting of chronic steroid therapy. She was recently admitted to [facility] with severe chest and back discomfort. EKG showed rate-controlled atrial fibrillation which was a new diagnosis. She ruled in for MI. Echo showed normal LV function. Cath revealed an occluded obtuse marginal branch of the left circumflex with other wise nonobstructive disease and normal LV function. The obtuse marginal was successfully stented using a 2.75 x 16 mm Rebel BMS. She did well postprocedure. With new onset A. fib, she was placed on Coumadin with a plan to continue aspirin, Plavix, and warfarin for 1 month and then to discontinue aspirin at that time.  Since discharge, she has done well. She has not had any chest pain or dyspnea. She says that she has been taking it fairly easy because she was.  She denies chest pain, palpitations, dyspnea, pnd, orthopnea, n, v, dizziness, syncope, edema, weight gain, or early satiety.  Right groin catheterization site has been healing well. She was noted to have significant tibial infection with sores under both breasts. She was given Diflucan in the hospital but no further treatment was initiated. She also had new onset atrial fibrillation and has been on warfarin. There've been no attempts at cardioversion.  [name] returns today for follow-up. She is in persistent A. fib. She's currently on Plavix and warfarin. I reviewed her INRs which are been followed at [facility] and they indicate that she's been subtherapeutic several times. We cannot continue with cardioversion at this time. I've asked [name], our anticoagulation pharmacist at [facility] to possibly follow her for the next month to try to get her INR therapeutic. She  continues to feel somewhat fatigued and may benefit from cardioversion.  PMHx:  Past Medical History  Diagnosis Date  . Hyperlipidemia   . Hypertension   . Hypothyroid   . Sleep apnea 04/2012    Mild/ AHI 10/CPAP 7cm h2o w/2 L o2  . Osteoporosis   . CAD (coronary artery disease)     a. 01/2015 NSTEMI/PCI: LM nl, LAD 30p, LCX nondom, 30-40p, 50d, OM1 100p (2.75x16 Rebel BMS), RCA mild diff plaque, EF 55%.  . A-fib     a. Dx 01/2015, CHA2DS2VASc = 5-->coumadin started.  . Candida infection, disseminated   . H/O echocardiogram     a. 01/2015 Echo: EF 50-55%, mild LVH, no rwma, mild MR, mildly dil LA, mod TR, PASP 53 mmHg.    Past Surgical History  Procedure Laterality Date  . Left heart catheterization with coronary angiogram N/A 02/20/2015    Procedure: LEFT HEART CATHETERIZATION WITH CORONARY ANGIOGRAM;  Surgeon: Jolaine Artist, MD;  Location: [facility] CATH LAB;  Service: Cardiovascular;  Laterality: N/A;  . Percutaneous coronary stent intervention (pci-s)  02/20/2015    Procedure: PERCUTANEOUS CORONARY STENT INTERVENTION (PCI-S);  Surgeon: Jolaine Artist, MD;  Location: [facility] CATH LAB;  Service: Cardiovascular;;  OM1  (2.75/23mm Rebel)    FAMHx:  Family History  Problem Relation Age of Onset  . Coronary artery disease Father   . Diabetes Father   . Stroke Mother   . Arthritis Mother     SOCHx:   reports that she quit smoking about 20 years ago. She has never used smokeless tobacco. She reports that she does not drink alcohol  or use illicit drugs.  ALLERGIES:  Allergies  Allergen Reactions  . Fosamax [Alendronate Sodium]     ROS: A comprehensive review of systems was negative except for: Constitutional: positive for fatigue  HOME MEDS: Current Outpatient Prescriptions  Medication Sig Dispense Refill  . ALPRAZolam (XANAX) 0.25 MG tablet TAKE 1 TABLET BY MOUTH AT BEDTIME AS NEEDED 30 tablet 0  . amLODipine (NORVASC) 5 MG tablet Take 1 tablet (5 mg total) by mouth daily. 30  tablet 6  . atorvastatin (LIPITOR) 80 MG tablet Take 1 tablet (80 mg total) by mouth daily at 6 PM. 30 tablet 6  . calcium carbonate 1250 MG capsule Take 1,250 mg by mouth 2 (two) times daily with a meal.    . cholecalciferol (VITAMIN D) 1000 UNITS tablet Take 1,000 Units by mouth daily.    . clopidogrel (PLAVIX) 75 MG tablet Take 1 tablet (75 mg total) by mouth daily with breakfast. 30 tablet 6  . clotrimazole-betamethasone (LOTRISONE) cream Apply 1 application topically 2 (two) times daily. 30 g 2  . cyanocobalamin 1000 MCG tablet Take 100 mcg by mouth daily.    Marland Kitchen donepezil (ARICEPT) 5 MG tablet Take 1 tablet (5 mg total) by mouth at bedtime. 30 tablet 5  . levothyroxine (SYNTHROID, LEVOTHROID) 137 MCG tablet TAKE 1 TABLET EVERY DAY 90 tablet 0  . lisinopril (PRINIVIL,ZESTRIL) 5 MG tablet Take 1 tablet (5 mg total) by mouth daily. 30 tablet 6  . metoprolol (LOPRESSOR) 50 MG tablet Take 0.5 tablets (25 mg total) by mouth 2 (two) times daily. 30 tablet 5  . nitroGLYCERIN (NITROSTAT) 0.4 MG SL tablet Place 1 tablet (0.4 mg total) under the tongue every 5 (five) minutes x 3 doses as needed for chest pain. 25 tablet 3  . pantoprazole (PROTONIX) 40 MG tablet Take 40 mg by mouth daily.    . raloxifene (EVISTA) 60 MG tablet TAKE 1 TABLET (60 MG TOTAL) BY MOUTH DAILY. 90 tablet 3  . traMADol (ULTRAM) 50 MG tablet TAKE 1 TABLET EVERY 6 HOURS AS NEEDED FOR PAIN 60 tablet 2  . warfarin (COUMADIN) 1 MG tablet Take 2 tablets daily or as directed by coumadin clinic 180 tablet 1   No current facility-administered medications for this visit.    LABS/IMAGING: No results found for this or any previous visit (from the past 48 hour(s)). Dg Bone Density  07/26/2015   CLINICAL DATA:  Postmenopausal.  EXAM: DUAL X-RAY ABSORPTIOMETRY (DXA) FOR BONE MINERAL DENSITY  FINDINGS: LEFT FEMUR NECK  Bone Mineral Density (BMD):  0.723 g/cm2  Young Adult T-Score: -1.1  Z-Score:  1.2  LEFT FOREARM (1/3 RADIUS)  Bone Mineral  Density (BMD):  0.502  Young Adult T Score:  -3.2  Z Score:  0.0  ASSESSMENT: Patient's diagnostic category is OSTEOPOROSIS by WHO Criteria.  FRACTURE RISK: INCREASED  FRAX: World Health Organization FRAX assessment of absolute fracture risk is not calculated for this patient because the patient has osteoporosis.  COMPARISON: The bone mineral density of the left hip has increased by 4.8% and there has been no significant interval change in the bone mineral density of the left forearm compared to the 03/12/2012 study.  Effective therapies are available in the form of bisphosphonates, selective estrogen receptor modulators, biologic agents, and hormone replacement therapy (for women). All patients should ensure an adequate intake of dietary calcium (1200 mg daily) and vitamin D (800 IU daily) unless contraindicated.  All treatment decisions require clinical judgment and consideration of individual patient factors,  including patient preferences, co-morbidities, previous drug use, risk factors not captured in the FRAX model (e.g., frailty, falls, vitamin D deficiency, increased bone turnover, interval significant decline in bone density) and possible under- or over-estimation of fracture risk by FRAX.  The National Osteoporosis Foundation recommends that FDA-approved medical therapies be considered in postmenopausal women and men age 46 or older with a:  1. Hip or vertebral (clinical or morphometric) fracture.  2. T-score of -2.5 or lower at the spine or hip.  3. Ten-year fracture probability by FRAX of 3% or greater for hip fracture or 20% or greater for major osteoporotic fracture.  People with diagnosed cases of osteoporosis or at high risk for fracture should have regular bone mineral density tests. For patients eligible for Medicare, routine testing is allowed once every 2 years. The testing frequency can be increased to one year for patients who have rapidly progressing disease, those who are receiving or  discontinuing medical therapy to restore bone mass, or have additional risk factors.  World Pharmacologist [facility]) Criteria:  Normal: T-scores from +1.0 to -1.0  Low Bone Mass (Osteopenia): T-scores between -1.0 and -2.5  Osteoporosis: T-scores -2.5 and below  Comparison to Reference Population:  T-score is the key measure used in the diagnosis of osteoporosis and relative risk determination for fracture. It provides a value for bone mass relative to the mean bone mass of a young adult reference population expressed in terms of standard deviation (SD).  Z-score is the age-matched score showing the patient's values compared to a population matched for age, sex, and race. This is also expressed in terms of standard deviation. The patient may have values that compare favorably to the age-matched values and still be at increased risk for fracture.   Electronically Signed   By: Lillia Mountain M.D.   On: 07/26/2015 12:54   Mm Digital Screening  07/26/2015   CLINICAL DATA:  Screening.  EXAM: DIGITAL SCREENING BILATERAL MAMMOGRAM WITH CAD  COMPARISON:  Previous exam(s).  ACR Breast Density Category b: There are scattered areas of fibroglandular density.  FINDINGS: There are no findings suspicious for malignancy. Images were processed with CAD.  IMPRESSION: No mammographic evidence of malignancy. A result letter of this screening mammogram will be mailed directly to the patient.  RECOMMENDATION: Screening mammogram in one year. (Code:[id number])  BI-RADS CATEGORY  1: Negative.   Electronically Signed   By: Altamese Cabal M.D.   On: 07/26/2015 13:23    WEIGHTS: Wt Readings from Last 3 Encounters:  07/28/15 164 lb 1.6 oz (74.435 kg)  07/11/15 164 lb (74.39 kg)  05/16/15 166 lb (75.297 kg)    VITALS: BP 128/80 mmHg  Pulse 82  Ht 4\' 9"  (1.448 m)  Wt 164 lb 1.6 oz (74.435 kg)  BMI 35.50 kg/m2  EXAM: Deferred  EKG: Atrial fibrillation at 78  ASSESSMENT: 1. NSTEMI-status post bare metal stent to obtuse  marginal 2. New onset atrial fibrillation on warfarin 3. Dyslipidemia 4. Hypertension 5. Tineal rash under both breasts 6. Memory loss, question early dementia  PLAN: 1.   Mrs. Landgren has persistent atrial fibrillation. Unfortunately her INRs have been somewhat subtherapeutic. She needs closer monitoring and therapeutic INRs for 1 month before considering cardioversion. She may ultimately be a candidate for no active however she continues on Plavix because of her recent stent. I will discuss with [name] our anticoagulation pharmacist, perhaps she can be followed more closely in our office. Plan to see her back in 4-6 weeks and will  readdress the issue of cardioversion for her ongoing fatigue.  Pixie Casino, MD, [facility] Attending Cardiologist [place] 07/28/2015, 9:25 AM

## 2015-08-02 ENCOUNTER — Ambulatory Visit (INDEPENDENT_AMBULATORY_CARE_PROVIDER_SITE_OTHER): Payer: Medicare Other | Admitting: Pharmacist Clinician (PhC)/ Clinical Pharmacy Specialist

## 2015-08-02 DIAGNOSIS — Z5181 Encounter for therapeutic drug level monitoring: Secondary | ICD-10-CM | POA: Diagnosis not present

## 2015-08-02 DIAGNOSIS — I4891 Unspecified atrial fibrillation: Secondary | ICD-10-CM | POA: Diagnosis not present

## 2015-08-02 DIAGNOSIS — I4819 Other persistent atrial fibrillation: Secondary | ICD-10-CM

## 2015-08-02 DIAGNOSIS — I481 Persistent atrial fibrillation: Secondary | ICD-10-CM | POA: Diagnosis not present

## 2015-08-02 LAB — POCT INR: INR: 2.7

## 2015-08-08 ENCOUNTER — Other Ambulatory Visit: Payer: Self-pay | Admitting: *Deleted

## 2015-08-08 MED ORDER — ATORVASTATIN CALCIUM 80 MG PO TABS: 80.0000 mg | ORAL_TABLET | Freq: Every day | ORAL | Status: DC

## 2015-08-08 MED ORDER — PANTOPRAZOLE SODIUM 40 MG PO TBEC: 40.0000 mg | DELAYED_RELEASE_TABLET | Freq: Every day | ORAL | Status: DC

## 2015-08-09 ENCOUNTER — Other Ambulatory Visit: Payer: Self-pay | Admitting: *Deleted

## 2015-08-09 ENCOUNTER — Ambulatory Visit (INDEPENDENT_AMBULATORY_CARE_PROVIDER_SITE_OTHER): Payer: Medicare Other | Admitting: Pharmacist Clinician (PhC)/ Clinical Pharmacy Specialist

## 2015-08-09 ENCOUNTER — Other Ambulatory Visit: Payer: Self-pay | Admitting: Family Medicine

## 2015-08-09 ENCOUNTER — Other Ambulatory Visit: Payer: Self-pay | Admitting: Internal Medicine

## 2015-08-09 DIAGNOSIS — I4891 Unspecified atrial fibrillation: Secondary | ICD-10-CM | POA: Diagnosis not present

## 2015-08-09 DIAGNOSIS — Z5181 Encounter for therapeutic drug level monitoring: Secondary | ICD-10-CM | POA: Diagnosis not present

## 2015-08-09 DIAGNOSIS — I4819 Other persistent atrial fibrillation: Secondary | ICD-10-CM

## 2015-08-09 DIAGNOSIS — I481 Persistent atrial fibrillation: Secondary | ICD-10-CM

## 2015-08-09 DIAGNOSIS — R413 Other amnesia: Secondary | ICD-10-CM

## 2015-08-09 LAB — POCT INR: INR: 2.6

## 2015-08-09 MED ORDER — LISINOPRIL 5 MG PO TABS: 5.0000 mg | ORAL_TABLET | Freq: Every day | ORAL | Status: DC

## 2015-08-09 MED ORDER — CLOPIDOGREL BISULFATE 75 MG PO TABS: 75.0000 mg | ORAL_TABLET | Freq: Every day | ORAL | Status: DC

## 2015-08-09 MED ORDER — METOPROLOL TARTRATE 50 MG PO TABS: 25.0000 mg | ORAL_TABLET | Freq: Two times a day (BID) | ORAL | Status: DC

## 2015-08-09 MED ORDER — RALOXIFENE HCL 60 MG PO TABS: 60.0000 mg | ORAL_TABLET | Freq: Every day | ORAL | Status: DC

## 2015-08-09 MED ORDER — DONEPEZIL HCL 5 MG PO TABS: 5.0000 mg | ORAL_TABLET | Freq: Every day | ORAL | Status: DC

## 2015-08-09 MED ORDER — AMLODIPINE BESYLATE 5 MG PO TABS: 5.0000 mg | ORAL_TABLET | Freq: Every day | ORAL | Status: DC

## 2015-08-09 NOTE — Telephone Encounter (Signed)
Medication refilled per protocol. 

## 2015-08-10 ENCOUNTER — Other Ambulatory Visit: Payer: Self-pay | Admitting: *Deleted

## 2015-08-11 ENCOUNTER — Other Ambulatory Visit: Payer: Self-pay | Admitting: *Deleted

## 2015-08-14 ENCOUNTER — Other Ambulatory Visit: Payer: Self-pay | Admitting: Family Medicine

## 2015-08-14 ENCOUNTER — Other Ambulatory Visit: Payer: Self-pay | Admitting: Nurse Practitioner

## 2015-08-14 NOTE — Telephone Encounter (Signed)
Refill appropriate and filled per protocol. 

## 2015-08-14 NOTE — Telephone Encounter (Signed)
Rx(s) sent to pharmacy electronically.  

## 2015-08-17 ENCOUNTER — Ambulatory Visit (INDEPENDENT_AMBULATORY_CARE_PROVIDER_SITE_OTHER): Payer: Medicare Other | Admitting: Pharmacist Clinician (PhC)/ Clinical Pharmacy Specialist

## 2015-08-17 ENCOUNTER — Telehealth: Payer: Self-pay | Admitting: *Deleted

## 2015-08-17 ENCOUNTER — Other Ambulatory Visit: Payer: Self-pay | Admitting: *Deleted

## 2015-08-17 ENCOUNTER — Encounter: Payer: Self-pay | Admitting: Internal Medicine

## 2015-08-17 DIAGNOSIS — I4891 Unspecified atrial fibrillation: Secondary | ICD-10-CM | POA: Diagnosis not present

## 2015-08-17 DIAGNOSIS — Z5181 Encounter for therapeutic drug level monitoring: Secondary | ICD-10-CM

## 2015-08-17 DIAGNOSIS — Z79899 Other long term (current) drug therapy: Secondary | ICD-10-CM

## 2015-08-17 DIAGNOSIS — D689 Coagulation defect, unspecified: Secondary | ICD-10-CM | POA: Diagnosis not present

## 2015-08-17 DIAGNOSIS — I481 Persistent atrial fibrillation: Secondary | ICD-10-CM | POA: Diagnosis not present

## 2015-08-17 DIAGNOSIS — I4819 Other persistent atrial fibrillation: Secondary | ICD-10-CM

## 2015-08-17 LAB — POCT INR: INR: 2.6

## 2015-08-17 NOTE — Telephone Encounter (Signed)
Patient notified [facility] scheduled for Tuesday 08/22/2015 @ 12noon.  Patient will get pre-procedure labs tomorrow and will come up to the office to get her instructions.

## 2015-08-18 ENCOUNTER — Telehealth: Payer: Self-pay | Admitting: Internal Medicine

## 2015-08-18 LAB — COMPREHENSIVE METABOLIC PANEL
ALBUMIN: 3.3 g/dL — AB (ref 3.6–5.1)
ALT: 9 U/L (ref 6–29)
AST: 14 U/L (ref 10–35)
Alkaline Phosphatase: 49 U/L (ref 33–130)
BUN: 15 mg/dL (ref 7–25)
CALCIUM: 8.4 mg/dL — AB (ref 8.6–10.4)
CHLORIDE: 107 mmol/L (ref 98–110)
CO2: 27 mmol/L (ref 20–31)
Creat: 0.78 mg/dL (ref 0.60–0.88)
Glucose, Bld: 83 mg/dL (ref 65–99)
POTASSIUM: 3.7 mmol/L (ref 3.5–5.3)
Sodium: 145 mmol/L (ref 135–146)
TOTAL PROTEIN: 5.6 g/dL — AB (ref 6.1–8.1)
Total Bilirubin: 0.7 mg/dL (ref 0.2–1.2)

## 2015-08-18 LAB — CBC
HEMATOCRIT: 41.2 % (ref 36.0–46.0)
Hemoglobin: 13.8 g/dL (ref 12.0–15.0)
MCH: 30.3 pg (ref 26.0–34.0)
MCHC: 33.5 g/dL (ref 30.0–36.0)
MCV: 90.4 fL (ref 78.0–100.0)
MPV: 9.8 fL (ref 8.6–12.4)
Platelets: 215 10*3/uL (ref 150–400)
RBC: 4.56 MIL/uL (ref 3.87–5.11)
RDW: 14.1 % (ref 11.5–15.5)
WBC: 5.8 10*3/uL (ref 4.0–10.5)

## 2015-08-18 LAB — APTT: APTT: 42 s — AB (ref 24–37)

## 2015-08-18 LAB — PROTIME-INR
INR: 2.28 — ABNORMAL HIGH (ref <1.50)
PROTHROMBIN TIME: 25.1 s — AB (ref 11.6–15.2)

## 2015-08-18 NOTE — Telephone Encounter (Signed)
Returned call to patient's husband.Advised after reviewing chart [facility] will be doing cardioversion 08/22/15.Arrive at [facility] at 11:00 am.

## 2015-08-18 NOTE — Telephone Encounter (Signed)
Please call,he wants to know who is doing her procedure on next Tuesday.

## 2015-08-21 MED ORDER — SODIUM CHLORIDE 0.9 % IV SOLN: INTRAVENOUS | Status: DC

## 2015-08-22 ENCOUNTER — Ambulatory Visit (HOSPITAL_COMMUNITY)
Admission: RE | Admit: 2015-08-22 | Discharge: 2015-08-22 | Disposition: A | Discharge status: Home / Self Care | Payer: Medicare Other | Source: Ambulatory Visit | Attending: Internal Medicine | Admitting: Internal Medicine

## 2015-08-22 ENCOUNTER — Encounter (HOSPITAL_COMMUNITY)
Admission: RE | Disposition: A | Discharge status: Home / Self Care | Payer: Self-pay | Source: Ambulatory Visit | Attending: Internal Medicine

## 2015-08-22 ENCOUNTER — Ambulatory Visit (INDEPENDENT_AMBULATORY_CARE_PROVIDER_SITE_OTHER): Payer: Medicare Other | Admitting: Cardiology

## 2015-08-22 ENCOUNTER — Telehealth: Payer: Self-pay | Admitting: Internal Medicine

## 2015-08-22 ENCOUNTER — Ambulatory Visit (HOSPITAL_COMMUNITY): Payer: Medicare Other | Admitting: Anesthesiology

## 2015-08-22 ENCOUNTER — Encounter (HOSPITAL_COMMUNITY): Payer: Self-pay | Admitting: Cardiovascular Disease

## 2015-08-22 DIAGNOSIS — Z5181 Encounter for therapeutic drug level monitoring: Secondary | ICD-10-CM

## 2015-08-22 DIAGNOSIS — Z7952 Long term (current) use of systemic steroids: Secondary | ICD-10-CM | POA: Insufficient documentation

## 2015-08-22 DIAGNOSIS — I481 Persistent atrial fibrillation: Secondary | ICD-10-CM | POA: Diagnosis not present

## 2015-08-22 DIAGNOSIS — I2510 Atherosclerotic heart disease of native coronary artery without angina pectoris: Secondary | ICD-10-CM | POA: Diagnosis not present

## 2015-08-22 DIAGNOSIS — I4891 Unspecified atrial fibrillation: Secondary | ICD-10-CM | POA: Diagnosis not present

## 2015-08-22 DIAGNOSIS — R413 Other amnesia: Secondary | ICD-10-CM | POA: Diagnosis not present

## 2015-08-22 DIAGNOSIS — Z87891 Personal history of nicotine dependence: Secondary | ICD-10-CM | POA: Diagnosis not present

## 2015-08-22 DIAGNOSIS — Z955 Presence of coronary angioplasty implant and graft: Secondary | ICD-10-CM | POA: Insufficient documentation

## 2015-08-22 DIAGNOSIS — I10 Essential (primary) hypertension: Secondary | ICD-10-CM | POA: Diagnosis not present

## 2015-08-22 DIAGNOSIS — M810 Age-related osteoporosis without current pathological fracture: Secondary | ICD-10-CM | POA: Diagnosis not present

## 2015-08-22 DIAGNOSIS — G4730 Sleep apnea, unspecified: Secondary | ICD-10-CM | POA: Diagnosis not present

## 2015-08-22 DIAGNOSIS — Z7901 Long term (current) use of anticoagulants: Secondary | ICD-10-CM | POA: Insufficient documentation

## 2015-08-22 DIAGNOSIS — I252 Old myocardial infarction: Secondary | ICD-10-CM | POA: Diagnosis not present

## 2015-08-22 DIAGNOSIS — E785 Hyperlipidemia, unspecified: Secondary | ICD-10-CM | POA: Diagnosis not present

## 2015-08-22 DIAGNOSIS — R21 Rash and other nonspecific skin eruption: Secondary | ICD-10-CM | POA: Diagnosis not present

## 2015-08-22 DIAGNOSIS — E039 Hypothyroidism, unspecified: Secondary | ICD-10-CM | POA: Diagnosis not present

## 2015-08-22 DIAGNOSIS — Z79899 Other long term (current) drug therapy: Secondary | ICD-10-CM | POA: Insufficient documentation

## 2015-08-22 DIAGNOSIS — Z7902 Long term (current) use of antithrombotics/antiplatelets: Secondary | ICD-10-CM | POA: Insufficient documentation

## 2015-08-22 DIAGNOSIS — I4819 Other persistent atrial fibrillation: Secondary | ICD-10-CM

## 2015-08-22 HISTORY — PX: CARDIOVERSION: SHX1299

## 2015-08-22 LAB — PROTIME-INR
INR: 2.69 — AB (ref 0.00–1.49)
PROTHROMBIN TIME: 28.2 s — AB (ref 11.6–15.2)

## 2015-08-22 SURGERY — CARDIOVERSION
Anesthesia: General

## 2015-08-22 MED ORDER — LIDOCAINE HCL (CARDIAC) 20 MG/ML IV SOLN
INTRAVENOUS | Status: DC | PRN
  Administered 2015-08-22: 60 mg via INTRAVENOUS

## 2015-08-22 MED ORDER — PROPOFOL 10 MG/ML IV BOLUS
INTRAVENOUS | Status: DC | PRN
  Administered 2015-08-22: 60 mg via INTRAVENOUS

## 2015-08-22 NOTE — Discharge Instructions (Signed)

## 2015-08-22 NOTE — Transfer of Care (Signed)
Immediate Anesthesia Transfer of Care Note  Patient: [name]  Procedure(s) Performed: Procedure(s): CARDIOVERSION (N/A)  Patient Location: Endoscopy Unit  Anesthesia Type:MAC  Level of Consciousness: awake, alert  and oriented  Airway & Oxygen Therapy: Patient Spontanous Breathing and Patient connected to nasal cannula oxygen  Post-op Assessment: Report given to RN, Post -op Vital signs reviewed and stable and Patient moving all extremities  Post vital signs: Reviewed and stable  Last Vitals:  Filed Vitals:   08/22/15 1212  BP: 125/61  Pulse: 79  Temp:   Resp: 29    Complications: No apparent anesthesia complications

## 2015-08-22 NOTE — Anesthesia Preprocedure Evaluation (Addendum)
Anesthesia Evaluation  Patient identified by MRN, date of birth, ID band Patient awake    Reviewed: Allergy & Precautions, NPO status , Patient's Chart, lab work & pertinent test results, reviewed documented beta blocker date and time   History of Anesthesia Complications Negative for: history of anesthetic complications  Airway Mallampati: I  TM Distance: >3 FB Neck ROM: Full    Dental  (+) Edentulous Upper, Edentulous Lower   Pulmonary sleep apnea and Continuous Positive Airway Pressure Ventilation , former smoker,  breath sounds clear to auscultation        Cardiovascular hypertension, Pt. on medications and Pt. on home beta blockers + CAD, + Past MI and + Cardiac Stents Rhythm:Regular Rate:Normal     Neuro/Psych negative neurological ROS  negative psych ROS   GI/Hepatic negative GI ROS, Neg liver ROS,   Endo/Other  Hypothyroidism   Renal/GU negative Renal ROS  negative genitourinary   Musculoskeletal negative musculoskeletal ROS (+)   Abdominal   Peds negative pediatric ROS (+)  Hematology negative hematology ROS (+)   Anesthesia Other Findings   Reproductive/Obstetrics negative OB ROS                           Lab Results  Component Value Date   WBC 5.8 08/17/2015   HGB 13.8 08/17/2015   HCT 41.2 08/17/2015   MCV 90.4 08/17/2015   PLT 215 08/17/2015   Lab Results  Component Value Date   CREATININE 0.78 08/17/2015   BUN 15 08/17/2015   NA 145 08/17/2015   K 3.7 08/17/2015   CL 107 08/17/2015   CO2 27 08/17/2015   Lab Results  Component Value Date   INR 2.28* 08/17/2015   INR 2.6 08/17/2015   INR 2.6 08/09/2015   07/28/15: EKG: atrial fibrillation, rate 80's.  01/2015: Echo  Study Conclusions  - Left ventricle: The cavity size was normal. Wall thickness was increased in a pattern of mild LVH. There was mild focal basal hypertrophy of the septum. Systolic function  was normal. The estimated ejection fraction was in the range of 50% to 55%. Wall motion was normal; there were no regional wall motion abnormalities. - Aortic valve: There was mild stenosis. Valve area (VTI): 0.79 cm^2. Valve area (Vmax): 0.8 cm^2. Valve area (Vmean): 0.71 cm^2. - Mitral valve: There was mild regurgitation. - Left atrium: The atrium was mildly dilated. - Tricuspid valve: There was moderate regurgitation. - Pulmonary arteries: PA peak pressure: 53 mm Hg (S). - Pericardium, extracardiac: A trivial pericardial effusion was identified posterior to the heart.   Anesthesia Physical Anesthesia Plan  ASA: III  Anesthesia Plan: General   Post-op Pain Management:    Induction: Intravenous  Airway Management Planned: Natural Airway and Simple Face Mask  Additional Equipment:   Intra-op Plan:   Post-operative Plan:   Informed Consent: I have reviewed the patients History and Physical, chart, labs and discussed the procedure including the risks, benefits and alternatives for the proposed anesthesia with the patient or authorized representative who has indicated his/her understanding and acceptance.   Dental advisory given  Plan Discussed with: CRNA  Anesthesia Plan Comments:         Anesthesia Quick Evaluation

## 2015-08-22 NOTE — Telephone Encounter (Signed)
Per nurse at [facility], pt had INR checked there today so appt for 08-23-15 was cancelled-wanted to let you know so she could make her next appt

## 2015-08-22 NOTE — Telephone Encounter (Signed)
Result noted see anticoagulation note in Epic.  

## 2015-08-22 NOTE — CV Procedure (Signed)
Electrical Cardioversion Procedure Note ANNAMARY BUSCHMAN [id number] 1933-08-13  Procedure: Electrical Cardioversion Indications:  Atrial Fibrillation  Procedure Details Consent: Risks of procedure as well as the alternatives and risks of each were explained to the (patient/caregiver).  Consent for procedure obtained. Time Out: Verified patient identification, verified procedure, site/side was marked, verified correct patient position, special equipment/implants available, medications/allergies/relevent history reviewed, required imaging and test results available.  Performed  Patient placed on cardiac monitor, pulse oximetry, supplemental oxygen as necessary.  Sedation given: propofol via anesthesia (90 mg) Pacer pads placed anterior and posterior chest.  Cardioverted 3 time(s).  Cardioverted at 150J, 200J, 200J.  Evaluation Findings: Post procedure EKG shows: Atrial Fibrillation Complications: None Patient did tolerate procedure well.   Skeet Latch P 08/22/2015, 12:11 PM

## 2015-08-22 NOTE — Anesthesia Postprocedure Evaluation (Signed)
  Anesthesia Post-op Note  Patient: [name]  Procedure(s) Performed: Procedure(s): CARDIOVERSION (N/A)  Patient Location: Endoscopy Unit  Anesthesia Type:General  Level of Consciousness: awake, alert  and oriented  Airway and Oxygen Therapy: Patient Spontanous Breathing  Post-op Pain: none  Post-op Assessment: Post-op Vital signs reviewed and Patient's Cardiovascular Status Stable              Post-op Vital Signs: Reviewed and stable  Last Vitals:  Filed Vitals:   08/22/15 1250  BP: 139/78  Pulse: 59  Temp:   Resp: 23    Complications: No apparent anesthesia complications

## 2015-08-23 ENCOUNTER — Ambulatory Visit: Payer: Self-pay | Admitting: Pharmacist Clinician (PhC)/ Clinical Pharmacy Specialist

## 2015-08-23 ENCOUNTER — Encounter (HOSPITAL_COMMUNITY): Payer: Self-pay | Admitting: Cardiovascular Disease

## 2015-08-24 NOTE — H&P (Signed)
From clinic note 07/28/15:    OFFICE NOTE  Chief Complaint:  Follow-up a-fib  Primary Care Physician: Odette Fraction, MD  HPI:  [name] is an 81 yo female with a prior history of hypertension, hyperlipidemia, hypothyroidism, sleep apnea, and osteoporosis in the setting of chronic steroid therapy. She was recently admitted to [facility] with severe chest and back discomfort. EKG showed rate-controlled atrial fibrillation which was a new diagnosis. She ruled in for MI. Echo showed normal LV function. Cath revealed an occluded obtuse marginal branch of the left circumflex with other wise nonobstructive disease and normal LV function. The obtuse marginal was successfully stented using a 2.75 x 16 mm Rebel BMS. She did well postprocedure. With new onset A. fib, she was placed on Coumadin with a plan to continue aspirin, Plavix, and warfarin for 1 month and then to discontinue aspirin at that time.  Since discharge, she has done well. She has not had any chest pain or dyspnea. She says that she has been taking it fairly easy because she was. She denies chest pain, palpitations, dyspnea, pnd, orthopnea, n, v, dizziness, syncope, edema, weight gain, or early satiety. Right groin catheterization site has been healing well. She was noted to have significant tibial infection with sores under both breasts. She was given Diflucan in the hospital but no further treatment was initiated. She also had new onset atrial fibrillation and has been on warfarin. There've been no attempts at cardioversion.  [name] returns today for follow-up. She is in persistent A. fib. She's currently on Plavix and warfarin. I reviewed her INRs which are been followed at [facility] and they indicate that she's been subtherapeutic several times. We cannot continue with cardioversion at this time. I've asked Erasmo Downer, our anticoagulation pharmacist at [facility] to possibly follow her for the next month to try to get  her INR therapeutic. She continues to feel somewhat fatigued and may benefit from cardioversion.  PMHx:  Past Medical History  Diagnosis Date  . Hyperlipidemia   . Hypertension   . Hypothyroid   . Sleep apnea 04/2012    Mild/ AHI 10/CPAP 7cm h2o w/2 L o2  . Osteoporosis   . CAD (coronary artery disease)     a. 01/2015 NSTEMI/PCI: LM nl, LAD 30p, LCX nondom, 30-40p, 50d, OM1 100p (2.75x16 Rebel BMS), RCA mild diff plaque, EF 55%.  . A-fib     a. Dx 01/2015, CHA2DS2VASc = 5-->coumadin started.  . Candida infection, disseminated   . H/O echocardiogram     a. 01/2015 Echo: EF 50-55%, mild LVH, no rwma, mild MR, mildly dil LA, mod TR, PASP 53 mmHg.    Past Surgical History  Procedure Laterality Date  . Left heart catheterization with coronary angiogram N/A 02/20/2015    Procedure: LEFT HEART CATHETERIZATION WITH CORONARY ANGIOGRAM; Surgeon: Jolaine Artist, MD; Location: [facility] CATH LAB; Service: Cardiovascular; Laterality: N/A;  . Percutaneous coronary stent intervention (pci-s)  02/20/2015    Procedure: PERCUTANEOUS CORONARY STENT INTERVENTION (PCI-S); Surgeon: Jolaine Artist, MD; Location: [facility] CATH LAB; Service: Cardiovascular;; OM1 (2.75/34mm Rebel)    FAMHx:  Family History  Problem Relation Age of Onset  . Coronary artery disease Father   . Diabetes Father   . Stroke Mother   . Arthritis Mother     SOCHx:   reports that she quit smoking about 20 years ago. She has never used smokeless tobacco. She reports that she does not drink alcohol or use illicit drugs.  ALLERGIES:  Allergies  Allergen Reactions  . Fosamax [Alendronate Sodium]     ROS: A comprehensive review of systems was negative except for: Constitutional: positive for fatigue  HOME MEDS: Current Outpatient Prescriptions  Medication Sig Dispense Refill  . ALPRAZolam (XANAX) 0.25 MG tablet TAKE 1 TABLET BY  MOUTH AT BEDTIME AS NEEDED 30 tablet 0  . amLODipine (NORVASC) 5 MG tablet Take 1 tablet (5 mg total) by mouth daily. 30 tablet 6  . atorvastatin (LIPITOR) 80 MG tablet Take 1 tablet (80 mg total) by mouth daily at 6 PM. 30 tablet 6  . calcium carbonate 1250 MG capsule Take 1,250 mg by mouth 2 (two) times daily with a meal.    . cholecalciferol (VITAMIN D) 1000 UNITS tablet Take 1,000 Units by mouth daily.    . clopidogrel (PLAVIX) 75 MG tablet Take 1 tablet (75 mg total) by mouth daily with breakfast. 30 tablet 6  . clotrimazole-betamethasone (LOTRISONE) cream Apply 1 application topically 2 (two) times daily. 30 g 2  . cyanocobalamin 1000 MCG tablet Take 100 mcg by mouth daily.    Marland Kitchen donepezil (ARICEPT) 5 MG tablet Take 1 tablet (5 mg total) by mouth at bedtime. 30 tablet 5  . levothyroxine (SYNTHROID, LEVOTHROID) 137 MCG tablet TAKE 1 TABLET EVERY DAY 90 tablet 0  . lisinopril (PRINIVIL,ZESTRIL) 5 MG tablet Take 1 tablet (5 mg total) by mouth daily. 30 tablet 6  . metoprolol (LOPRESSOR) 50 MG tablet Take 0.5 tablets (25 mg total) by mouth 2 (two) times daily. 30 tablet 5  . nitroGLYCERIN (NITROSTAT) 0.4 MG SL tablet Place 1 tablet (0.4 mg total) under the tongue every 5 (five) minutes x 3 doses as needed for chest pain. 25 tablet 3  . pantoprazole (PROTONIX) 40 MG tablet Take 40 mg by mouth daily.    . raloxifene (EVISTA) 60 MG tablet TAKE 1 TABLET (60 MG TOTAL) BY MOUTH DAILY. 90 tablet 3  . traMADol (ULTRAM) 50 MG tablet TAKE 1 TABLET EVERY 6 HOURS AS NEEDED FOR PAIN 60 tablet 2  . warfarin (COUMADIN) 1 MG tablet Take 2 tablets daily or as directed by coumadin clinic 180 tablet 1   No current facility-administered medications for this visit.    LABS/IMAGING:  Lab Results Last 48 Hours    No results found for this or any previous visit (from the past 48 hour(s)).    Imaging Results (Last 48 hours)    Dg  Bone Density  07/26/2015 CLINICAL DATA: Postmenopausal. EXAM: DUAL X-RAY ABSORPTIOMETRY (DXA) FOR BONE MINERAL DENSITY FINDINGS: LEFT FEMUR NECK Bone Mineral Density (BMD): 0.723 g/cm2 Young Adult T-Score: -1.1 Z-Score: 1.2 LEFT FOREARM (1/3 RADIUS) Bone Mineral Density (BMD): 0.502 Young Adult T Score: -3.2 Z Score: 0.0 ASSESSMENT: Patient's diagnostic category is OSTEOPOROSIS by WHO Criteria. FRACTURE RISK: INCREASED FRAX: World Health Organization FRAX assessment of absolute fracture risk is not calculated for this patient because the patient has osteoporosis. COMPARISON: The bone mineral density of the left hip has increased by 4.8% and there has been no significant interval change in the bone mineral density of the left forearm compared to the 03/12/2012 study. Effective therapies are available in the form of bisphosphonates, selective estrogen receptor modulators, biologic agents, and hormone replacement therapy (for women). All patients should ensure an adequate intake of dietary calcium (1200 mg daily) and vitamin D (800 IU daily) unless contraindicated. All treatment decisions require clinical judgment and consideration of individual patient factors, including patient preferences, co-morbidities, previous drug use, risk factors not  captured in the FRAX model (e.g., frailty, falls, vitamin D deficiency, increased bone turnover, interval significant decline in bone density) and possible under- or over-estimation of fracture risk by FRAX. The National Osteoporosis Foundation recommends that FDA-approved medical therapies be considered in postmenopausal women and men age 52 or older with a: 1. Hip or vertebral (clinical or morphometric) fracture. 2. T-score of -2.5 or lower at the spine or hip. 3. Ten-year fracture probability by FRAX of 3% or greater for hip fracture or 20% or greater for major osteoporotic fracture. People with diagnosed cases of osteoporosis or at high risk for  fracture should have regular bone mineral density tests. For patients eligible for Medicare, routine testing is allowed once every 2 years. The testing frequency can be increased to one year for patients who have rapidly progressing disease, those who are receiving or discontinuing medical therapy to restore bone mass, or have additional risk factors. World Pharmacologist [facility]) Criteria: Normal: T-scores from +1.0 to -1.0 Low Bone Mass (Osteopenia): T-scores between -1.0 and -2.5 Osteoporosis: T-scores -2.5 and below Comparison to Reference Population: T-score is the key measure used in the diagnosis of osteoporosis and relative risk determination for fracture. It provides a value for bone mass relative to the mean bone mass of a young adult reference population expressed in terms of standard deviation (SD). Z-score is the age-matched score showing the patient's values compared to a population matched for age, sex, and race. This is also expressed in terms of standard deviation. The patient may have values that compare favorably to the age-matched values and still be at increased risk for fracture. Electronically Signed By: Lillia Mountain M.D. On: 07/26/2015 12:54   Mm Digital Screening  07/26/2015 CLINICAL DATA: Screening. EXAM: DIGITAL SCREENING BILATERAL MAMMOGRAM WITH CAD COMPARISON: Previous exam(s). ACR Breast Density Category b: There are scattered areas of fibroglandular density. FINDINGS: There are no findings suspicious for malignancy. Images were processed with CAD. IMPRESSION: No mammographic evidence of malignancy. A result letter of this screening mammogram will be mailed directly to the patient. RECOMMENDATION: Screening mammogram in one year. (Code:SM-B-01Y) BI-RADS CATEGORY 1: Negative. Electronically Signed By: Altamese Cabal M.D. On: 07/26/2015 13:23     WEIGHTS: Wt Readings from Last 3 Encounters:  07/28/15 164 lb 1.6 oz (74.435 kg)  07/11/15 164  lb (74.39 kg)  05/16/15 166 lb (75.297 kg)    VITALS: BP 128/80 mmHg  Pulse 82  Ht 4\' 9"  (1.448 m)  Wt 164 lb 1.6 oz (74.435 kg)  BMI 35.50 kg/m2  EXAM: Deferred  EKG: Atrial fibrillation at 78  ASSESSMENT: 1. NSTEMI-status post bare metal stent to obtuse marginal 2. New onset atrial fibrillation on warfarin 3. Dyslipidemia 4. Hypertension 5. Tineal rash under both breasts 6. Memory loss, question early dementia  PLAN: 1. [name] has persistent atrial fibrillation. Unfortunately her INRs have been somewhat subtherapeutic. She needs closer monitoring and therapeutic INRs for 1 month before considering cardioversion. She may ultimately be a candidate for no active however she continues on Plavix because of her recent stent. I will discuss with Erasmo Downer our anticoagulation pharmacist, perhaps she can be followed more closely in our office. Plan to see her back in 4-6 weeks and will readdress the issue of cardioversion for her ongoing fatigue.  Pixie Casino, MD, [facility] Attending Cardiologist [place] 07/28/2015, 9:25 AM          No significant change since appointment with Dr. Debara Pickett.  Dr. Lilli Light. Oval Linsey

## 2015-08-30 ENCOUNTER — Ambulatory Visit (INDEPENDENT_AMBULATORY_CARE_PROVIDER_SITE_OTHER): Payer: Medicare Other | Admitting: Pharmacist Clinician (PhC)/ Clinical Pharmacy Specialist

## 2015-08-30 DIAGNOSIS — I4819 Other persistent atrial fibrillation: Secondary | ICD-10-CM

## 2015-08-30 DIAGNOSIS — I4891 Unspecified atrial fibrillation: Secondary | ICD-10-CM

## 2015-08-30 DIAGNOSIS — Z5181 Encounter for therapeutic drug level monitoring: Secondary | ICD-10-CM

## 2015-08-30 DIAGNOSIS — I481 Persistent atrial fibrillation: Secondary | ICD-10-CM | POA: Diagnosis not present

## 2015-08-30 LAB — POCT INR: INR: 2.6

## 2015-09-01 ENCOUNTER — Other Ambulatory Visit: Payer: Self-pay | Admitting: Family Medicine

## 2015-09-01 NOTE — Telephone Encounter (Signed)
?   OK to Refill  

## 2015-09-01 NOTE — Telephone Encounter (Signed)
ok 

## 2015-09-11 ENCOUNTER — Encounter: Payer: Self-pay | Admitting: Internal Medicine

## 2015-09-11 ENCOUNTER — Ambulatory Visit (INDEPENDENT_AMBULATORY_CARE_PROVIDER_SITE_OTHER): Payer: Medicare Other | Admitting: Internal Medicine

## 2015-09-11 ENCOUNTER — Ambulatory Visit (INDEPENDENT_AMBULATORY_CARE_PROVIDER_SITE_OTHER): Payer: Medicare Other | Admitting: Pharmacist Clinician (PhC)/ Clinical Pharmacy Specialist

## 2015-09-11 VITALS — BP 112/62 | HR 83 | Ht <= 58 in | Wt 162.1 lb

## 2015-09-11 DIAGNOSIS — E785 Hyperlipidemia, unspecified: Secondary | ICD-10-CM | POA: Diagnosis not present

## 2015-09-11 DIAGNOSIS — I481 Persistent atrial fibrillation: Secondary | ICD-10-CM

## 2015-09-11 DIAGNOSIS — I4891 Unspecified atrial fibrillation: Secondary | ICD-10-CM | POA: Diagnosis not present

## 2015-09-11 DIAGNOSIS — I10 Essential (primary) hypertension: Secondary | ICD-10-CM | POA: Diagnosis not present

## 2015-09-11 DIAGNOSIS — I2583 Coronary atherosclerosis due to lipid rich plaque: Secondary | ICD-10-CM

## 2015-09-11 DIAGNOSIS — I4819 Other persistent atrial fibrillation: Secondary | ICD-10-CM

## 2015-09-11 DIAGNOSIS — I2510 Atherosclerotic heart disease of native coronary artery without angina pectoris: Secondary | ICD-10-CM

## 2015-09-11 DIAGNOSIS — Z5181 Encounter for therapeutic drug level monitoring: Secondary | ICD-10-CM | POA: Diagnosis not present

## 2015-09-11 LAB — POCT INR: INR: 3

## 2015-09-11 NOTE — Patient Instructions (Signed)
Your physician recommends that you schedule a follow-up appointment in: [place] DR. HILTY.

## 2015-09-11 NOTE — Progress Notes (Signed)
OFFICE NOTE  Chief Complaint:  Follow-up a-fib  Primary Care Physician: Odette Fraction, MD  HPI:  [name] is an 81 yo female with a prior history of hypertension, hyperlipidemia, hypothyroidism, sleep apnea, and osteoporosis in the setting of chronic steroid therapy. She was recently admitted to [facility] with severe chest and back discomfort. EKG showed rate-controlled atrial fibrillation which was a new diagnosis. She ruled in for MI. Echo showed normal LV function. Cath revealed an occluded obtuse marginal branch of the left circumflex with other wise nonobstructive disease and normal LV function. The obtuse marginal was successfully stented using a 2.75 x 16 mm Rebel BMS. She did well postprocedure. With new onset A. fib, she was placed on Coumadin with a plan to continue aspirin, Plavix, and warfarin for 1 month and then to discontinue aspirin at that time.  Since discharge, she has done well. She has not had any chest pain or dyspnea. She says that she has been taking it fairly easy because she was.  She denies chest pain, palpitations, dyspnea, pnd, orthopnea, n, v, dizziness, syncope, edema, weight gain, or early satiety.  Right groin catheterization site has been healing well. She was noted to have significant tibial infection with sores under both breasts. She was given Diflucan in the hospital but no further treatment was initiated. She also had new onset atrial fibrillation and has been on warfarin. There've been no attempts at cardioversion.  [name] returns today for follow-up. She is in persistent A. fib. She's currently on Plavix and warfarin. I reviewed her INRs which are been followed at [facility] and they indicate that she's been subtherapeutic several times. We cannot continue with cardioversion at this time. I've asked Erasmo Downer, our anticoagulation pharmacist at [facility] to possibly follow her for the next month to try to get her INR therapeutic. She  continues to feel somewhat fatigued and may benefit from cardioversion.   I head pleasure seeing [name] back in the office today. She recently underwent elective cardioversion by my partner Dr. Oval Linsey. Unfortunately this was unsuccessful and she remains in atrial fibrillation. She says however she does feel stronger and she is asymptomatic at this time. She's had resolution of her candidal infection under her breasts with a treatment of a topical anti-fungal. INR is mildly supratherapeutic.  PMHx:  Past Medical History  Diagnosis Date  . Hyperlipidemia   . Hypertension   . Hypothyroid   . Sleep apnea 04/2012    Mild/ AHI 10/CPAP 7cm h2o w/2 L o2  . Osteoporosis   . CAD (coronary artery disease)     a. 01/2015 NSTEMI/PCI: LM nl, LAD 30p, LCX nondom, 30-40p, 50d, OM1 100p (2.75x16 Rebel BMS), RCA mild diff plaque, EF 55%.  . A-fib     a. Dx 01/2015, CHA2DS2VASc = 5-->coumadin started.  . Candida infection, disseminated   . H/O echocardiogram     a. 01/2015 Echo: EF 50-55%, mild LVH, no rwma, mild MR, mildly dil LA, mod TR, PASP 53 mmHg.    Past Surgical History  Procedure Laterality Date  . Left heart catheterization with coronary angiogram N/A 02/20/2015    Procedure: LEFT HEART CATHETERIZATION WITH CORONARY ANGIOGRAM;  Surgeon: Jolaine Artist, MD;  Location: [facility] CATH LAB;  Service: Cardiovascular;  Laterality: N/A;  . Percutaneous coronary stent intervention (pci-s)  02/20/2015    Procedure: PERCUTANEOUS CORONARY STENT INTERVENTION (PCI-S);  Surgeon: Jolaine Artist, MD;  Location: [facility] CATH LAB;  Service: Cardiovascular;;  OM1  (2.75/39mm Rebel)  . Cardioversion N/A 08/22/2015    Procedure: CARDIOVERSION;  Surgeon: Skeet Latch, MD;  Location: [facility] ENDOSCOPY;  Service: Cardiovascular;  Laterality: N/A;    FAMHx:  Family History  Problem Relation Age of Onset  . Coronary artery disease Father   . Diabetes Father   . Stroke Mother   . Arthritis Mother     SOCHx:    reports that she quit smoking about 20 years ago. She has never used smokeless tobacco. She reports that she does not drink alcohol or use illicit drugs.  ALLERGIES:  Allergies  Allergen Reactions  . Fosamax [Alendronate Sodium]     ROS: A comprehensive review of systems was negative.  HOME MEDS: Current Outpatient Prescriptions  Medication Sig Dispense Refill  . ALPRAZolam (XANAX) 0.25 MG tablet TAKE 1 TABLET BY MOUTH AT BEDTIME AS NEEDED 30 tablet 2  . amLODipine (NORVASC) 5 MG tablet Take 1 tablet (5 mg total) by mouth daily. 30 tablet 6  . atorvastatin (LIPITOR) 80 MG tablet Take 1 tablet (80 mg total) by mouth daily at 6 PM. 90 tablet 1  . calcium carbonate 1250 MG capsule Take 1,250 mg by mouth 2 (two) times daily with a meal.    . cholecalciferol (VITAMIN D) 1000 UNITS tablet Take 1,000 Units by mouth daily.    . clopidogrel (PLAVIX) 75 MG tablet Take 1 tablet (75 mg total) by mouth daily with breakfast. 30 tablet 6  . clotrimazole-betamethasone (LOTRISONE) cream Apply 1 application topically 2 (two) times daily. 30 g 2  . cyanocobalamin 1000 MCG tablet Take 100 mcg by mouth daily.    Marland Kitchen donepezil (ARICEPT) 5 MG tablet Take 1 tablet (5 mg total) by mouth at bedtime. 90 tablet 3  . levothyroxine (SYNTHROID, LEVOTHROID) 137 MCG tablet TAKE 1 TABLET EVERY DAY 90 tablet 0  . lisinopril (PRINIVIL,ZESTRIL) 5 MG tablet Take 1 tablet (5 mg total) by mouth daily. 30 tablet 6  . metoprolol (LOPRESSOR) 50 MG tablet Take 0.5 tablets (25 mg total) by mouth 2 (two) times daily. 30 tablet 5  . nitroGLYCERIN (NITROSTAT) 0.4 MG SL tablet Place 1 tablet (0.4 mg total) under the tongue every 5 (five) minutes x 3 doses as needed for chest pain. 25 tablet 3  . pantoprazole (PROTONIX) 40 MG tablet Take 1 tablet (40 mg total) by mouth daily. 90 tablet 1  . raloxifene (EVISTA) 60 MG tablet Take 1 tablet (60 mg total) by mouth daily. 90 tablet 3  . traMADol (ULTRAM) 50 MG tablet TAKE 1 TABLET EVERY 6 HOURS  AS NEEDED FOR PAIN 60 tablet 2  . warfarin (COUMADIN) 1 MG tablet Take 2 tablets daily or as directed by coumadin clinic 180 tablet 1   No current facility-administered medications for this visit.    LABS/IMAGING: Results for orders placed or performed in visit on 09/11/15 (from the past 48 hour(s))  POCT INR     Status: None   Collection Time: 09/11/15  3:21 PM  Result Value Ref Range   INR 3    No results found.  WEIGHTS: Wt Readings from Last 3 Encounters:  09/11/15 162 lb 1.6 oz (73.528 kg)  08/22/15 164 lb (74.39 kg)  07/28/15 164 lb 1.6 oz (74.435 kg)    VITALS: BP 112/62 mmHg  Pulse 83  Ht 4\' 9"  (1.448 m)  Wt 162 lb 1.6 oz (73.528 kg)  BMI 35.07 kg/m2  EXAM: General appearance: alert and no distress Lungs: clear to auscultation bilaterally  Heart: regular rate and rhythm, S1, S2 normal, no murmur, click, rub or gallop Extremities: extremities normal, atraumatic, no cyanosis or edema Neurologic: Grossly normal  EKG: Atrial fibrillation at 83  ASSESSMENT: 1. NSTEMI-status post bare metal stent to obtuse marginal 2. New onset atrial fibrillation on warfarin -  Failed cardioversion, ?symptomatic - CHADVASC 5 3. Dyslipidemia 4. Hypertension 5. Tineal rash under both breasts - resolved 6. Memory loss, question early dementia  PLAN: 1.   [name] has persistent atrial fibrillation  That is refractory to cardioversion. She reports her symptoms actually have improved and therefore I'm not certain there related to the A. Fib. At this point I recommend  Continued rate control and anticoagulation. She denies any chest pain. Blood pressure is at goal.  Her tinea rash is resolved. She was overjoyed today when I felt that she could return back in 6 months. She should certainly contact us if she needs [place] sooner.  Pixie Casino, MD, [facility] Attending Cardiologist [place] 09/11/2015, 3:41 PM

## 2015-09-24 ENCOUNTER — Other Ambulatory Visit: Payer: Self-pay | Admitting: Internal Medicine

## 2015-10-10 ENCOUNTER — Ambulatory Visit: Payer: Medicare Other

## 2015-10-11 ENCOUNTER — Ambulatory Visit (INDEPENDENT_AMBULATORY_CARE_PROVIDER_SITE_OTHER): Payer: Medicare Other | Admitting: *Deleted

## 2015-10-11 DIAGNOSIS — I481 Persistent atrial fibrillation: Secondary | ICD-10-CM | POA: Diagnosis not present

## 2015-10-11 DIAGNOSIS — I4891 Unspecified atrial fibrillation: Secondary | ICD-10-CM | POA: Diagnosis not present

## 2015-10-11 DIAGNOSIS — Z5181 Encounter for therapeutic drug level monitoring: Secondary | ICD-10-CM | POA: Diagnosis not present

## 2015-10-11 DIAGNOSIS — I4819 Other persistent atrial fibrillation: Secondary | ICD-10-CM

## 2015-10-11 LAB — POCT INR: INR: 2.6

## 2015-10-16 ENCOUNTER — Telehealth: Payer: Self-pay | Admitting: Internal Medicine

## 2015-10-16 NOTE — Telephone Encounter (Signed)
Lattie Haw is calling in to discuss a possible drug interaction between the pt's Plavix and Warfarin.

## 2015-10-16 NOTE — Telephone Encounter (Signed)
Spoke to caller from [facility]. Acknowledged Dr. Debara Pickett has pt on both medicines, confirmed on chart. Last OV acknowledges this with no instruction to alter therapy.

## 2015-10-18 ENCOUNTER — Ambulatory Visit: Payer: Medicare Other

## 2015-11-05 ENCOUNTER — Other Ambulatory Visit: Payer: Self-pay | Admitting: Internal Medicine

## 2015-11-11 ENCOUNTER — Other Ambulatory Visit: Payer: Self-pay | Admitting: Family Medicine

## 2015-11-22 ENCOUNTER — Ambulatory Visit (INDEPENDENT_AMBULATORY_CARE_PROVIDER_SITE_OTHER): Payer: Medicare Other | Admitting: *Deleted

## 2015-11-22 DIAGNOSIS — I481 Persistent atrial fibrillation: Secondary | ICD-10-CM

## 2015-11-22 DIAGNOSIS — Z5181 Encounter for therapeutic drug level monitoring: Secondary | ICD-10-CM | POA: Diagnosis not present

## 2015-11-22 DIAGNOSIS — I4891 Unspecified atrial fibrillation: Secondary | ICD-10-CM | POA: Diagnosis not present

## 2015-11-22 DIAGNOSIS — I4819 Other persistent atrial fibrillation: Secondary | ICD-10-CM

## 2015-11-22 LAB — POCT INR: INR: 2.8

## 2015-12-04 ENCOUNTER — Ambulatory Visit (INDEPENDENT_AMBULATORY_CARE_PROVIDER_SITE_OTHER): Payer: Medicare Other | Admitting: Family Medicine

## 2015-12-04 ENCOUNTER — Encounter: Payer: Self-pay | Admitting: Family Medicine

## 2015-12-04 VITALS — BP 122/70 | HR 78 | Temp 97.5°F | Resp 18 | Ht <= 58 in

## 2015-12-04 DIAGNOSIS — H8110 Benign paroxysmal vertigo, unspecified ear: Secondary | ICD-10-CM

## 2015-12-04 MED ORDER — MECLIZINE HCL 25 MG PO TABS: 25.0000 mg | ORAL_TABLET | Freq: Three times a day (TID) | ORAL | Status: DC | PRN

## 2015-12-04 NOTE — Progress Notes (Signed)
Subjective:    Patient ID: [name], female    DOB: [birth year], 81 y.o.   MRN: [id number]  HPI  symptoms began 1 week ago. Patient describes vertigo when she rolls over in bed or stands to get up or  Sits up in bed.   She denies any hearing loss or ringing in the ears. She does report nausea and vomiting with the vertigo. She denies any ear pain or sinus pain. Past Medical History  Diagnosis Date  . Hyperlipidemia   . Hypertension   . Hypothyroid   . Sleep apnea 04/2012    Mild/ AHI 10/CPAP 7cm h2o w/2 L o2  . Osteoporosis   . CAD (coronary artery disease)     a. 01/2015 NSTEMI/PCI: LM nl, LAD 30p, LCX nondom, 30-40p, 50d, OM1 100p (2.75x16 Rebel BMS), RCA mild diff plaque, EF 55%.  . A-fib ([place])     a. Dx 01/2015, CHA2DS2VASc = 5-->coumadin started.  . Candida infection, disseminated ([place])   . H/O echocardiogram     a. 01/2015 Echo: EF 50-55%, mild LVH, no rwma, mild MR, mildly dil LA, mod TR, PASP 53 mmHg.   Past Surgical History  Procedure Laterality Date  . Left heart catheterization with coronary angiogram N/A 02/20/2015    Procedure: LEFT HEART CATHETERIZATION WITH CORONARY ANGIOGRAM;  Surgeon: Jolaine Artist, MD;  Location: [facility] CATH LAB;  Service: Cardiovascular;  Laterality: N/A;  . Percutaneous coronary stent intervention (pci-s)  02/20/2015    Procedure: PERCUTANEOUS CORONARY STENT INTERVENTION (PCI-S);  Surgeon: Jolaine Artist, MD;  Location: [facility] CATH LAB;  Service: Cardiovascular;;  OM1  (2.75/4mm Rebel)  . Cardioversion N/A 08/22/2015    Procedure: CARDIOVERSION;  Surgeon: Skeet Latch, MD;  Location: [place];  Service: Cardiovascular;  Laterality: N/A;   Current Outpatient Prescriptions on File Prior to Visit  Medication Sig Dispense Refill  . ALPRAZolam (XANAX) 0.25 MG tablet TAKE 1 TABLET BY MOUTH AT BEDTIME AS NEEDED 30 tablet 2  . amLODipine (NORVASC) 5 MG tablet Take 1 tablet (5 mg total) by mouth daily. 30 tablet 6  . atorvastatin (LIPITOR) 80  MG tablet Take 1 tablet (80 mg total) by mouth daily at 6 PM. 90 tablet 1  . calcium carbonate 1250 MG capsule Take 1,250 mg by mouth 2 (two) times daily with a meal.    . cholecalciferol (VITAMIN D) 1000 UNITS tablet Take 1,000 Units by mouth daily.    . clopidogrel (PLAVIX) 75 MG tablet Take 1 tablet (75 mg total) by mouth daily with breakfast. 30 tablet 6  . clotrimazole-betamethasone (LOTRISONE) cream Apply 1 application topically 2 (two) times daily. 30 g 2  . cyanocobalamin 1000 MCG tablet Take 100 mcg by mouth daily.    Marland Kitchen donepezil (ARICEPT) 5 MG tablet Take 1 tablet (5 mg total) by mouth at bedtime. 90 tablet 3  . levothyroxine (SYNTHROID, LEVOTHROID) 137 MCG tablet TAKE 1 TABLET EVERY DAY 90 tablet 3  . lisinopril (PRINIVIL,ZESTRIL) 5 MG tablet Take 1 tablet (5 mg total) by mouth daily. 30 tablet 6  . metoprolol (LOPRESSOR) 50 MG tablet Take 0.5 tablets (25 mg total) by mouth 2 (two) times daily. 30 tablet 5  . nitroGLYCERIN (NITROSTAT) 0.4 MG SL tablet Place 1 tablet (0.4 mg total) under the tongue every 5 (five) minutes x 3 doses as needed for chest pain. 25 tablet 3  . pantoprazole (PROTONIX) 40 MG tablet Take 1 tablet (40 mg total) by mouth daily. 90 tablet 1  .  raloxifene (EVISTA) 60 MG tablet Take 1 tablet (60 mg total) by mouth daily. 90 tablet 3  . traMADol (ULTRAM) 50 MG tablet TAKE 1 TABLET EVERY 6 HOURS AS NEEDED FOR PAIN 60 tablet 2  . warfarin (COUMADIN) 1 MG tablet Take 2-3 tablets by mouth daily as directed by coumadin clinic 180 tablet 1   No current facility-administered medications on file prior to visit.   Allergies  Allergen Reactions  . Fosamax [Alendronate Sodium]    Social History   Social History  . Marital Status: Married    Spouse Name: N/A  . Number of Children: N/A  . Years of Education: N/A   Occupational History  . Not on file.   Social History Main Topics  . Smoking status: Former Smoker    Quit date: 04/28/1995  . Smokeless tobacco: Never  Used  . Alcohol Use: No  . Drug Use: No  . Sexual Activity: Not on file   Other Topics Concern  . Not on file   Social History Narrative     Review of Systems  All other systems reviewed and are negative.      Objective:   Physical Exam  Constitutional: She is oriented to person, place, and time. She appears well-developed and well-nourished.  HENT:  Right Ear: Tympanic membrane, external ear and ear canal normal.  Left Ear: Tympanic membrane, external ear and ear canal normal.  Eyes: Conjunctivae and EOM are normal. Pupils are equal, round, and reactive to light.  Cardiovascular: Normal rate.  An irregularly irregular rhythm present.  No murmur heard. Pulmonary/Chest: Effort normal and breath sounds normal. No respiratory distress. She has no wheezes. She has no rales.  Neurological: She is alert and oriented to person, place, and time. She has normal reflexes. She displays normal reflexes. No cranial nerve deficit. She exhibits normal muscle tone. Coordination normal.  Vitals reviewed.         Assessment & Plan:  BPPV (benign paroxysmal positional vertigo), unspecified laterality - Plan: meclizine (ANTIVERT) 25 MG tablet   patient is experiencing BPPV. Begin meclizine 25 mg by mouth every 8 hours when necessary vertigo. I cautioned the patient that the medication can make her drowsy. Also gave the patient fall precautions to avoid falling due to the vertigo. Recheck in one week if no better or sooner if worse

## 2015-12-26 ENCOUNTER — Other Ambulatory Visit: Payer: Self-pay | Admitting: Family Medicine

## 2015-12-26 NOTE — Telephone Encounter (Signed)
Refill appropriate and filled per protocol. 

## 2016-01-03 ENCOUNTER — Ambulatory Visit (INDEPENDENT_AMBULATORY_CARE_PROVIDER_SITE_OTHER): Payer: Medicare Other | Admitting: Pharmacist

## 2016-01-03 DIAGNOSIS — I481 Persistent atrial fibrillation: Secondary | ICD-10-CM

## 2016-01-03 DIAGNOSIS — Z5181 Encounter for therapeutic drug level monitoring: Secondary | ICD-10-CM | POA: Diagnosis not present

## 2016-01-03 DIAGNOSIS — I4819 Other persistent atrial fibrillation: Secondary | ICD-10-CM

## 2016-01-03 DIAGNOSIS — I4891 Unspecified atrial fibrillation: Secondary | ICD-10-CM

## 2016-01-03 LAB — POCT INR: INR: 1.5

## 2016-01-24 ENCOUNTER — Ambulatory Visit (INDEPENDENT_AMBULATORY_CARE_PROVIDER_SITE_OTHER): Payer: Medicare Other | Admitting: *Deleted

## 2016-01-24 DIAGNOSIS — I4891 Unspecified atrial fibrillation: Secondary | ICD-10-CM

## 2016-01-24 DIAGNOSIS — I4819 Other persistent atrial fibrillation: Secondary | ICD-10-CM

## 2016-01-24 DIAGNOSIS — Z5181 Encounter for therapeutic drug level monitoring: Secondary | ICD-10-CM

## 2016-01-24 DIAGNOSIS — I481 Persistent atrial fibrillation: Secondary | ICD-10-CM

## 2016-01-24 LAB — POCT INR: INR: 3

## 2016-02-03 ENCOUNTER — Other Ambulatory Visit: Payer: Self-pay | Admitting: Internal Medicine

## 2016-02-05 NOTE — Telephone Encounter (Signed)
Rx(s) sent to pharmacy electronically.  

## 2016-02-11 ENCOUNTER — Other Ambulatory Visit: Payer: Self-pay | Admitting: Nurse Practitioner

## 2016-02-12 NOTE — Telephone Encounter (Signed)
Rx(s) sent to pharmacy electronically.  

## 2016-02-17 ENCOUNTER — Other Ambulatory Visit: Payer: Self-pay | Admitting: Internal Medicine

## 2016-02-21 ENCOUNTER — Ambulatory Visit (INDEPENDENT_AMBULATORY_CARE_PROVIDER_SITE_OTHER): Payer: Medicare Other | Admitting: *Deleted

## 2016-02-21 DIAGNOSIS — I4891 Unspecified atrial fibrillation: Secondary | ICD-10-CM | POA: Diagnosis not present

## 2016-02-21 DIAGNOSIS — Z5181 Encounter for therapeutic drug level monitoring: Secondary | ICD-10-CM | POA: Diagnosis not present

## 2016-02-21 DIAGNOSIS — I4819 Other persistent atrial fibrillation: Secondary | ICD-10-CM

## 2016-02-21 DIAGNOSIS — I481 Persistent atrial fibrillation: Secondary | ICD-10-CM

## 2016-02-21 LAB — POCT INR: INR: 2.1

## 2016-02-27 ENCOUNTER — Other Ambulatory Visit: Payer: Self-pay | Admitting: Internal Medicine

## 2016-02-27 NOTE — Telephone Encounter (Signed)
REFILL 

## 2016-03-09 ENCOUNTER — Other Ambulatory Visit: Payer: Self-pay | Admitting: Internal Medicine

## 2016-03-20 ENCOUNTER — Ambulatory Visit (INDEPENDENT_AMBULATORY_CARE_PROVIDER_SITE_OTHER): Payer: Medicare Other | Admitting: *Deleted

## 2016-03-20 DIAGNOSIS — I4891 Unspecified atrial fibrillation: Secondary | ICD-10-CM | POA: Diagnosis not present

## 2016-03-20 DIAGNOSIS — I4819 Other persistent atrial fibrillation: Secondary | ICD-10-CM

## 2016-03-20 DIAGNOSIS — I481 Persistent atrial fibrillation: Secondary | ICD-10-CM

## 2016-03-20 DIAGNOSIS — Z5181 Encounter for therapeutic drug level monitoring: Secondary | ICD-10-CM | POA: Diagnosis not present

## 2016-03-20 LAB — POCT INR: INR: 1.9

## 2016-03-29 DIAGNOSIS — H52222 Regular astigmatism, left eye: Secondary | ICD-10-CM | POA: Diagnosis not present

## 2016-04-03 ENCOUNTER — Ambulatory Visit (INDEPENDENT_AMBULATORY_CARE_PROVIDER_SITE_OTHER): Payer: Medicare Other | Admitting: *Deleted

## 2016-04-03 DIAGNOSIS — I4819 Other persistent atrial fibrillation: Secondary | ICD-10-CM

## 2016-04-03 DIAGNOSIS — Z5181 Encounter for therapeutic drug level monitoring: Secondary | ICD-10-CM | POA: Diagnosis not present

## 2016-04-03 DIAGNOSIS — I481 Persistent atrial fibrillation: Secondary | ICD-10-CM | POA: Diagnosis not present

## 2016-04-03 DIAGNOSIS — I4891 Unspecified atrial fibrillation: Secondary | ICD-10-CM | POA: Diagnosis not present

## 2016-04-03 LAB — POCT INR: INR: 3.9

## 2016-04-06 ENCOUNTER — Other Ambulatory Visit: Payer: Self-pay | Admitting: Internal Medicine

## 2016-04-08 NOTE — Telephone Encounter (Signed)
Rx request sent to pharmacy.  

## 2016-04-14 ENCOUNTER — Other Ambulatory Visit: Payer: Self-pay | Admitting: Internal Medicine

## 2016-04-15 NOTE — Telephone Encounter (Signed)
Rx(s) sent to pharmacy electronically.  

## 2016-04-17 ENCOUNTER — Ambulatory Visit (INDEPENDENT_AMBULATORY_CARE_PROVIDER_SITE_OTHER): Payer: Medicare Other | Admitting: *Deleted

## 2016-04-17 DIAGNOSIS — I481 Persistent atrial fibrillation: Secondary | ICD-10-CM | POA: Diagnosis not present

## 2016-04-17 DIAGNOSIS — Z5181 Encounter for therapeutic drug level monitoring: Secondary | ICD-10-CM | POA: Diagnosis not present

## 2016-04-17 DIAGNOSIS — I4891 Unspecified atrial fibrillation: Secondary | ICD-10-CM | POA: Diagnosis not present

## 2016-04-17 DIAGNOSIS — I4819 Other persistent atrial fibrillation: Secondary | ICD-10-CM

## 2016-04-17 LAB — POCT INR: INR: 3.8

## 2016-04-26 ENCOUNTER — Other Ambulatory Visit: Payer: Self-pay | Admitting: Internal Medicine

## 2016-04-26 NOTE — Telephone Encounter (Signed)
Rx(s) sent to pharmacy electronically.  

## 2016-05-01 ENCOUNTER — Ambulatory Visit (INDEPENDENT_AMBULATORY_CARE_PROVIDER_SITE_OTHER): Payer: Medicare Other | Admitting: *Deleted

## 2016-05-01 DIAGNOSIS — I481 Persistent atrial fibrillation: Secondary | ICD-10-CM

## 2016-05-01 DIAGNOSIS — Z5181 Encounter for therapeutic drug level monitoring: Secondary | ICD-10-CM

## 2016-05-01 DIAGNOSIS — I4891 Unspecified atrial fibrillation: Secondary | ICD-10-CM

## 2016-05-01 DIAGNOSIS — I4819 Other persistent atrial fibrillation: Secondary | ICD-10-CM

## 2016-05-01 LAB — POCT INR: INR: 1.9

## 2016-05-04 ENCOUNTER — Other Ambulatory Visit: Payer: Self-pay | Admitting: Internal Medicine

## 2016-05-04 ENCOUNTER — Other Ambulatory Visit: Payer: Self-pay | Admitting: Family Medicine

## 2016-05-06 NOTE — Telephone Encounter (Signed)
ok 

## 2016-05-06 NOTE — Telephone Encounter (Signed)
Ok to refill 

## 2016-05-06 NOTE — Telephone Encounter (Signed)
Medication called to pharmacy. 

## 2016-05-06 NOTE — Telephone Encounter (Signed)
Rx request sent to pharmacy.  

## 2016-05-15 ENCOUNTER — Ambulatory Visit (INDEPENDENT_AMBULATORY_CARE_PROVIDER_SITE_OTHER): Payer: Medicare Other | Admitting: *Deleted

## 2016-05-15 DIAGNOSIS — Z5181 Encounter for therapeutic drug level monitoring: Secondary | ICD-10-CM | POA: Diagnosis not present

## 2016-05-15 DIAGNOSIS — I481 Persistent atrial fibrillation: Secondary | ICD-10-CM | POA: Diagnosis not present

## 2016-05-15 DIAGNOSIS — I4891 Unspecified atrial fibrillation: Secondary | ICD-10-CM

## 2016-05-15 DIAGNOSIS — I4819 Other persistent atrial fibrillation: Secondary | ICD-10-CM

## 2016-05-15 LAB — POCT INR: INR: 1.3

## 2016-05-22 ENCOUNTER — Ambulatory Visit (INDEPENDENT_AMBULATORY_CARE_PROVIDER_SITE_OTHER): Payer: Medicare Other | Admitting: Surgery

## 2016-05-22 ENCOUNTER — Other Ambulatory Visit (INDEPENDENT_AMBULATORY_CARE_PROVIDER_SITE_OTHER): Payer: Medicare Other

## 2016-05-22 DIAGNOSIS — Z79899 Other long term (current) drug therapy: Secondary | ICD-10-CM | POA: Diagnosis not present

## 2016-05-22 DIAGNOSIS — I4819 Other persistent atrial fibrillation: Secondary | ICD-10-CM

## 2016-05-22 DIAGNOSIS — E785 Hyperlipidemia, unspecified: Secondary | ICD-10-CM | POA: Diagnosis not present

## 2016-05-22 DIAGNOSIS — Z5181 Encounter for therapeutic drug level monitoring: Secondary | ICD-10-CM

## 2016-05-22 DIAGNOSIS — I481 Persistent atrial fibrillation: Secondary | ICD-10-CM | POA: Diagnosis not present

## 2016-05-22 DIAGNOSIS — I10 Essential (primary) hypertension: Secondary | ICD-10-CM

## 2016-05-22 DIAGNOSIS — I4891 Unspecified atrial fibrillation: Secondary | ICD-10-CM | POA: Diagnosis not present

## 2016-05-22 LAB — LIPID PANEL
CHOLESTEROL: 103 mg/dL — AB (ref 125–200)
HDL: 48 mg/dL (ref >=46)
LDL Cholesterol: 41 mg/dL (ref <130)
Total CHOL/HDL Ratio: 2.1 Ratio (ref <=5.0)
Triglycerides: 69 mg/dL (ref <150)
VLDL: 14 mg/dL (ref <30)

## 2016-05-22 LAB — CBC WITH DIFFERENTIAL/PLATELET
BASOS PCT: 0 %
Basophils Absolute: 0 cells/uL (ref 0–200)
EOS ABS: 118 {cells}/uL (ref 15–500)
Eosinophils Relative: 2 %
HEMATOCRIT: 44.4 % (ref 35.0–45.0)
Hemoglobin: 14.7 g/dL (ref 12.0–15.0)
Lymphocytes Relative: 24 %
Lymphs Abs: 1416 cells/uL (ref 850–3900)
MCH: 29.5 pg (ref 27.0–33.0)
MCHC: 33.1 g/dL (ref 32.0–36.0)
MCV: 89 fL (ref 80.0–100.0)
MONO ABS: 354 {cells}/uL (ref 200–950)
MPV: 9.6 fL (ref 7.5–12.5)
Monocytes Relative: 6 %
NEUTROS ABS: 4012 {cells}/uL (ref 1500–7800)
Neutrophils Relative %: 68 %
Platelets: 207 10*3/uL (ref 140–400)
RBC: 4.99 MIL/uL (ref 3.80–5.10)
RDW: 13.8 % (ref 11.0–15.0)
WBC: 5.9 10*3/uL (ref 3.8–10.8)

## 2016-05-22 LAB — COMPREHENSIVE METABOLIC PANEL
ALK PHOS: 46 U/L (ref 33–130)
ALT: 9 U/L (ref 6–29)
AST: 16 U/L (ref 10–35)
Albumin: 3.6 g/dL (ref 3.6–5.1)
BILIRUBIN TOTAL: 1 mg/dL (ref 0.2–1.2)
BUN: 15 mg/dL (ref 7–25)
CO2: 24 mmol/L (ref 20–31)
CREATININE: 0.75 mg/dL (ref 0.60–0.88)
Calcium: 8.8 mg/dL (ref 8.6–10.4)
Chloride: 109 mmol/L (ref 98–110)
Glucose, Bld: 78 mg/dL (ref 70–99)
Potassium: 3.8 mmol/L (ref 3.5–5.3)
Sodium: 143 mmol/L (ref 135–146)
TOTAL PROTEIN: 6.1 g/dL (ref 6.1–8.1)

## 2016-05-22 LAB — POCT INR: INR: 1.7

## 2016-05-25 ENCOUNTER — Other Ambulatory Visit: Payer: Self-pay | Admitting: Internal Medicine

## 2016-05-28 ENCOUNTER — Other Ambulatory Visit: Payer: Self-pay | Admitting: Family Medicine

## 2016-05-29 NOTE — Telephone Encounter (Signed)
Refill appropriate and filled per protocol. 

## 2016-06-06 ENCOUNTER — Ambulatory Visit (INDEPENDENT_AMBULATORY_CARE_PROVIDER_SITE_OTHER): Payer: Medicare Other | Admitting: Surgery

## 2016-06-06 DIAGNOSIS — I481 Persistent atrial fibrillation: Secondary | ICD-10-CM

## 2016-06-06 DIAGNOSIS — Z5181 Encounter for therapeutic drug level monitoring: Secondary | ICD-10-CM

## 2016-06-06 DIAGNOSIS — I4819 Other persistent atrial fibrillation: Secondary | ICD-10-CM

## 2016-06-06 LAB — POCT INR: INR: 3.2

## 2016-06-07 ENCOUNTER — Other Ambulatory Visit: Payer: Self-pay | Admitting: Family Medicine

## 2016-06-07 DIAGNOSIS — Z1231 Encounter for screening mammogram for malignant neoplasm of breast: Secondary | ICD-10-CM

## 2016-06-20 ENCOUNTER — Ambulatory Visit (INDEPENDENT_AMBULATORY_CARE_PROVIDER_SITE_OTHER): Payer: Medicare Other | Admitting: *Deleted

## 2016-06-20 DIAGNOSIS — I4819 Other persistent atrial fibrillation: Secondary | ICD-10-CM

## 2016-06-20 DIAGNOSIS — I481 Persistent atrial fibrillation: Secondary | ICD-10-CM | POA: Diagnosis not present

## 2016-06-20 DIAGNOSIS — Z5181 Encounter for therapeutic drug level monitoring: Secondary | ICD-10-CM | POA: Diagnosis not present

## 2016-06-20 LAB — POCT INR: INR: 2.4

## 2016-06-23 ENCOUNTER — Other Ambulatory Visit: Payer: Self-pay | Admitting: Internal Medicine

## 2016-06-24 NOTE — Telephone Encounter (Signed)
Rx request sent to pharmacy.  

## 2016-06-29 ENCOUNTER — Other Ambulatory Visit: Payer: Self-pay | Admitting: Internal Medicine

## 2016-06-30 ENCOUNTER — Other Ambulatory Visit: Payer: Self-pay | Admitting: Internal Medicine

## 2016-07-04 IMAGING — CR DG CHEST 2V
2 series · 2 of 2 positions shown · non-contrast
Comparison: April 27, 2012

CLINICAL DATA: One day history of chest pain

EXAM:
CHEST  2 VIEW

[x chest ap]
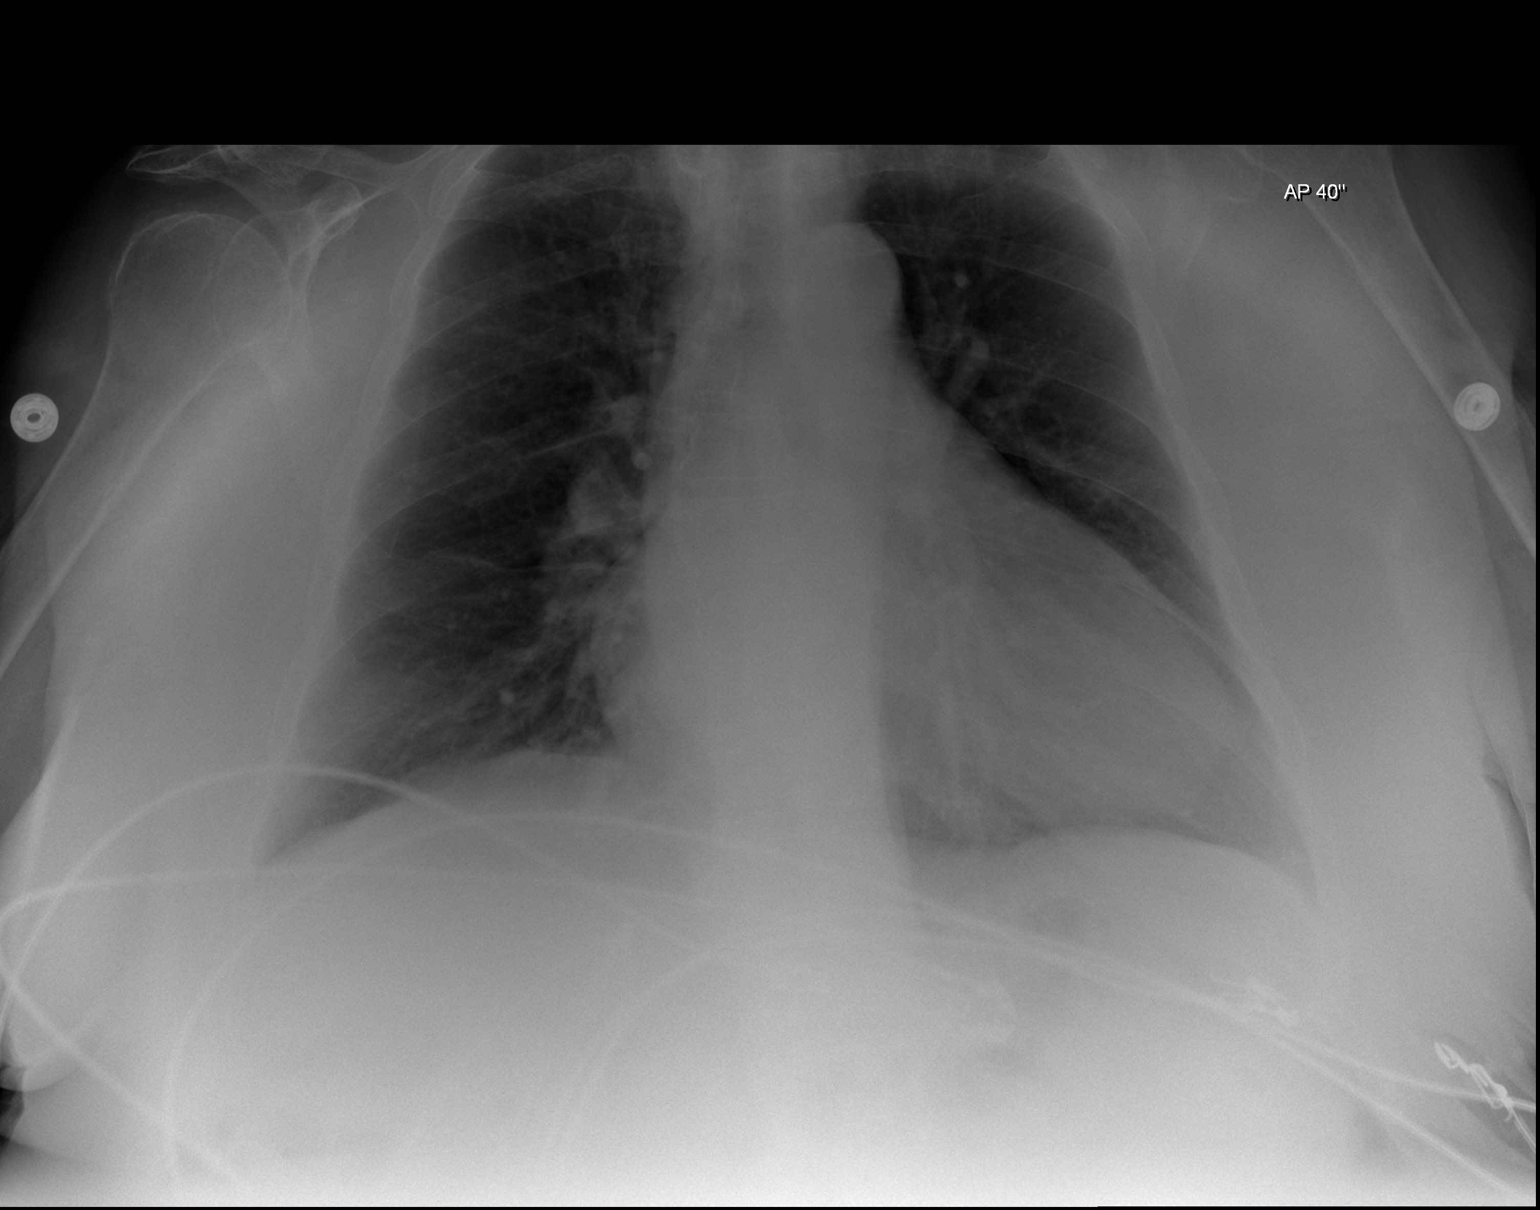

[w chest lat]
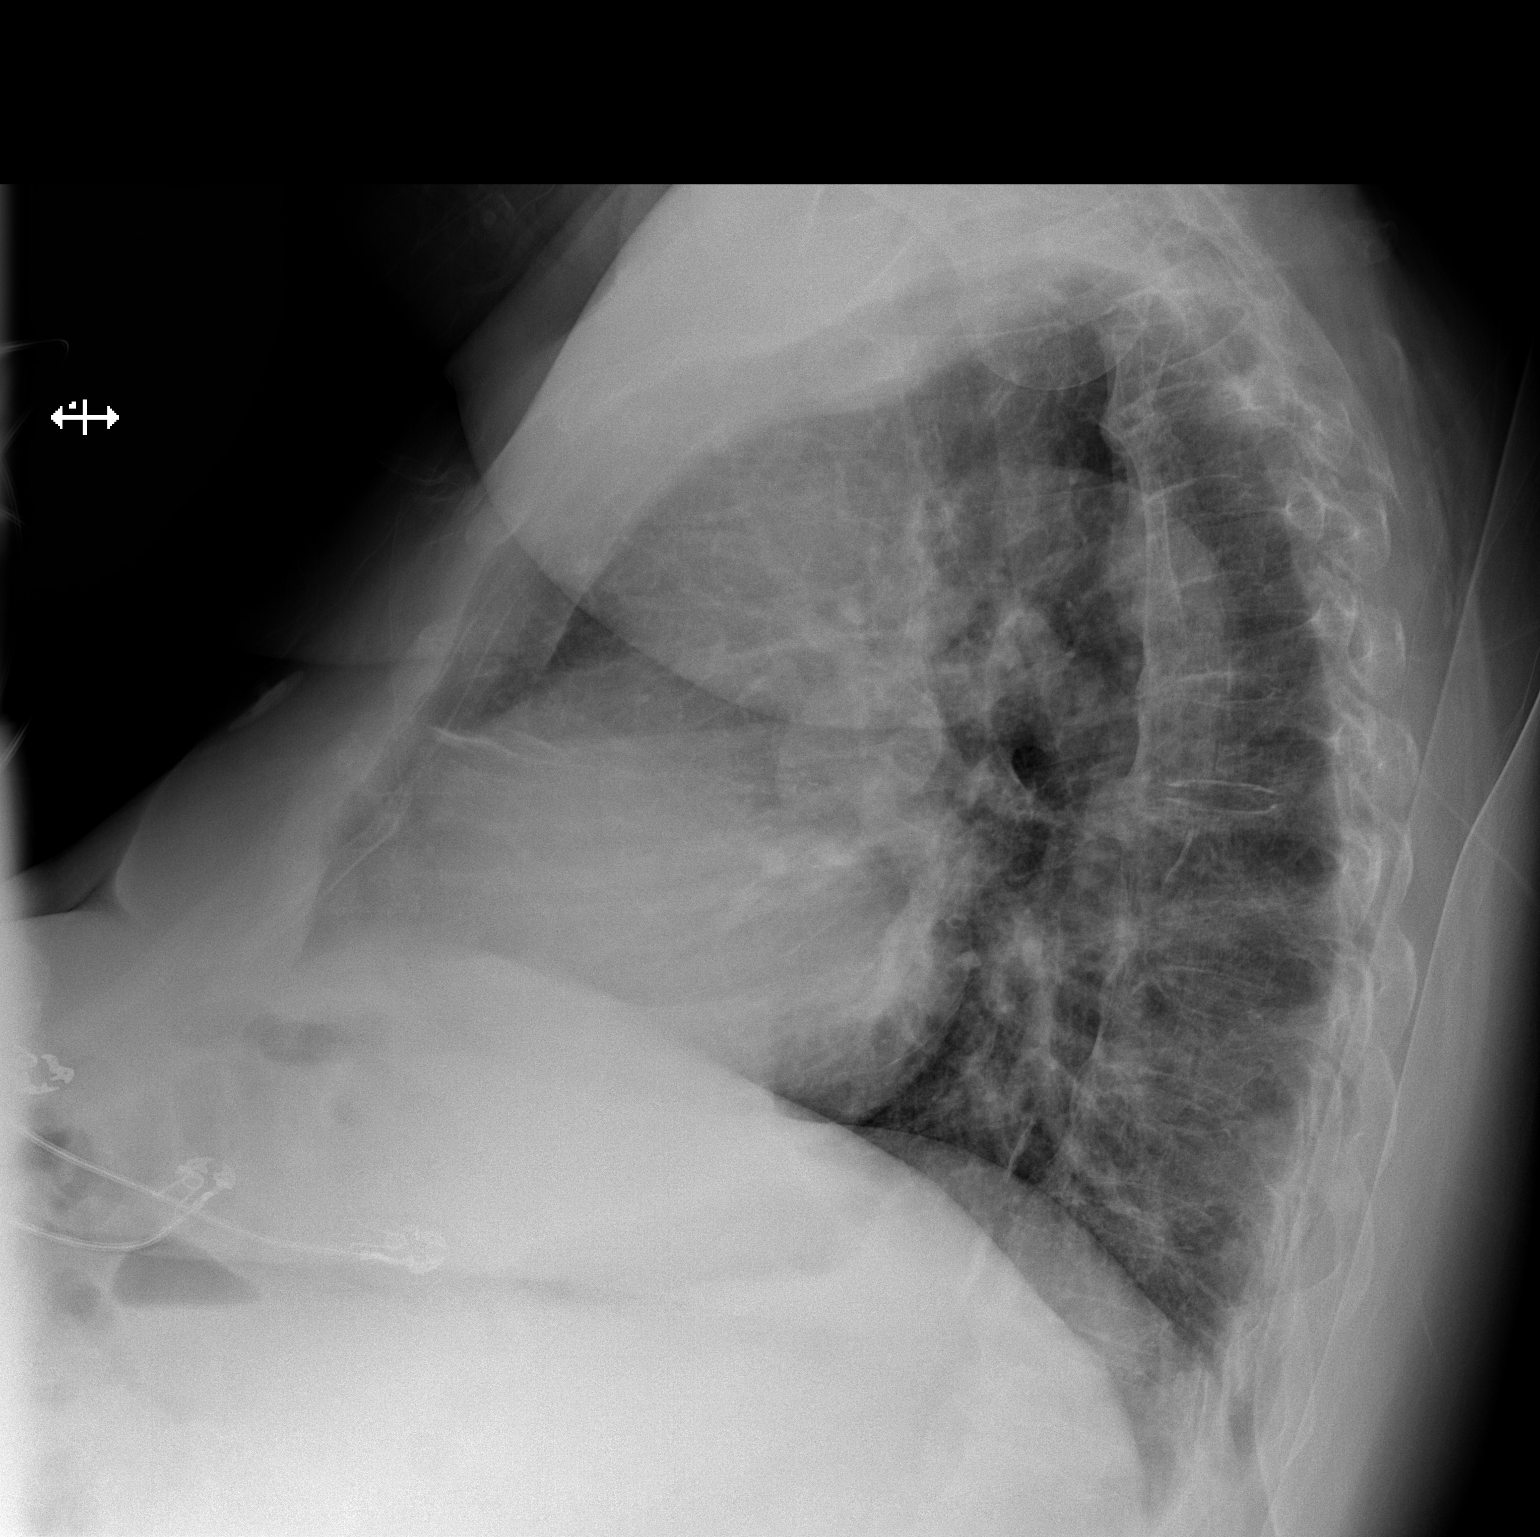

[2 of 2 positions shown; findings below may reference images not displayed]

FINDINGS: There is no edema or consolidation. There is mild atelectatic change
in the right middle lobe anteriorly. Heart is enlarged with
pulmonary vascularity within normal limits. No adenopathy. There is
degenerative change in the thoracic spine.
IMPRESSION: Atelectatic change right middle lobe anteriorly. No edema or
consolidation.

## 2016-07-04 IMAGING — CT CT ANGIO CHEST
2 of 9 series · 19 of 46 positions shown · IV contrast (Omni 300)
Comparison: None.

CLINICAL DATA: Acute chest pain today

EXAM:
CT ANGIOGRAPHY CHEST WITH CONTRAST
TECHNIQUE: Multidetector CT imaging of the chest was performed using the
standard protocol during bolus administration of intravenous
contrast. Multiplanar CT image reconstructions and MIPs were
obtained to evaluate the vascular anatomy.
CONTRAST:  80mL OMNIPAQUE IOHEXOL 350 MG/ML SOLN

[Series 5: thins · axial · 0.62mm/px · z∈[-285,-46]mm · 16 of 269 slices shown]
[im 15/269  lung]
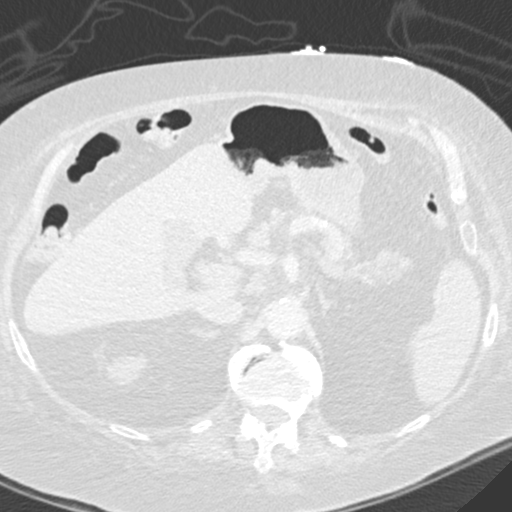
[im 30/269  soft-tissue]
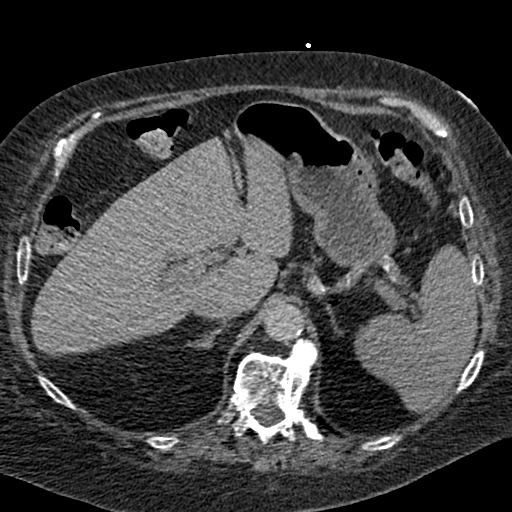
[im 45/269  lung]
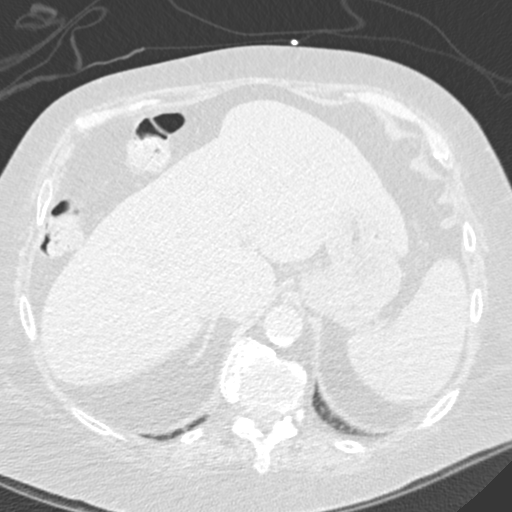
[im 60/269  soft-tissue]
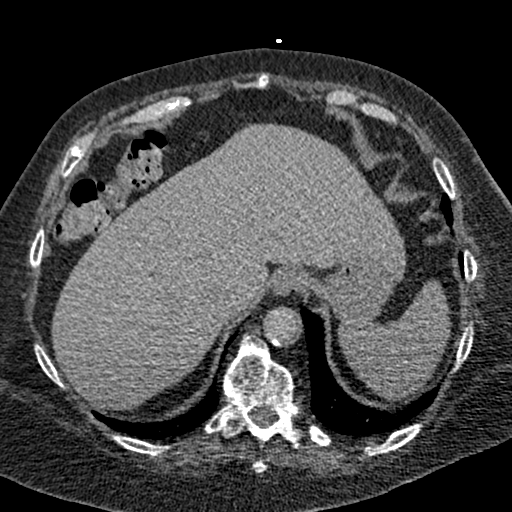
[im 75/269  lung]
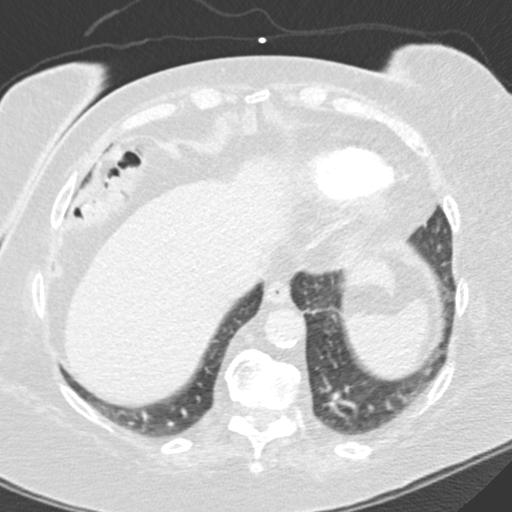
[im 90/269  soft-tissue]
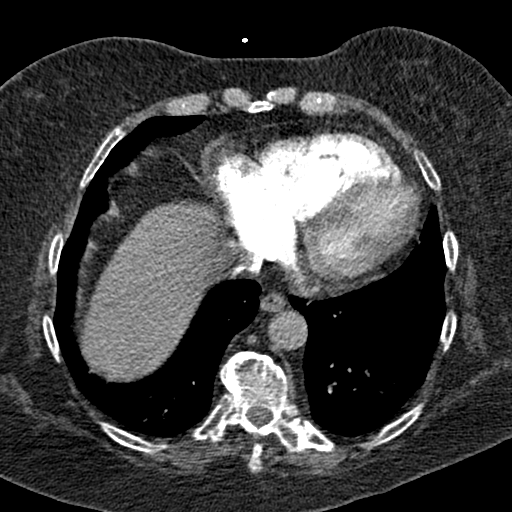
[im 105/269  lung]
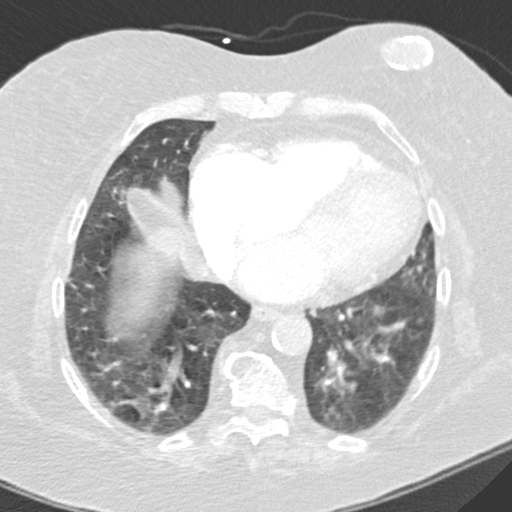
[im 120/269  soft-tissue]
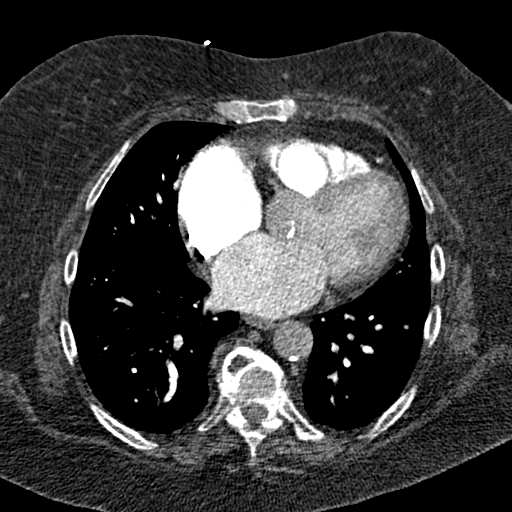
[im 149/269  lung]
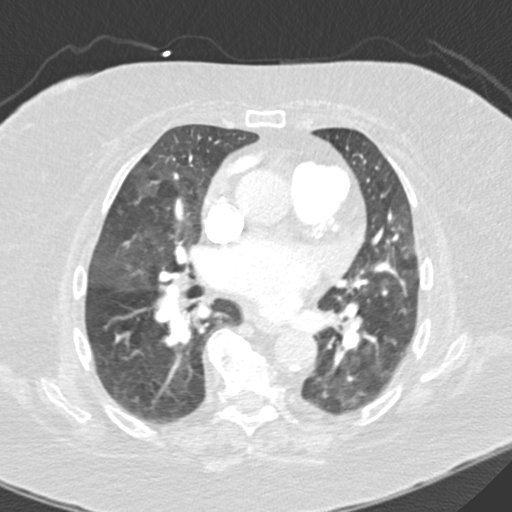
[im 164/269  soft-tissue]
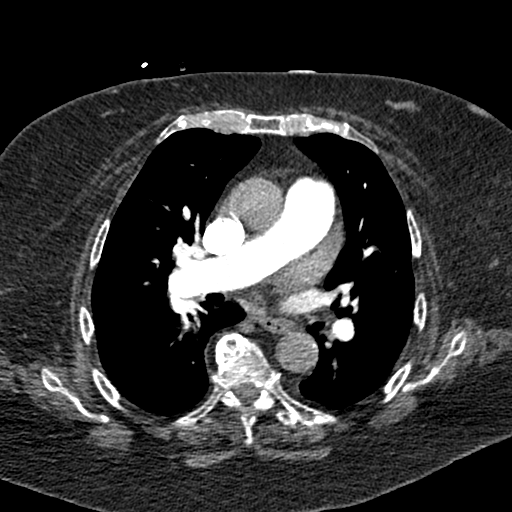
[im 179/269  lung]
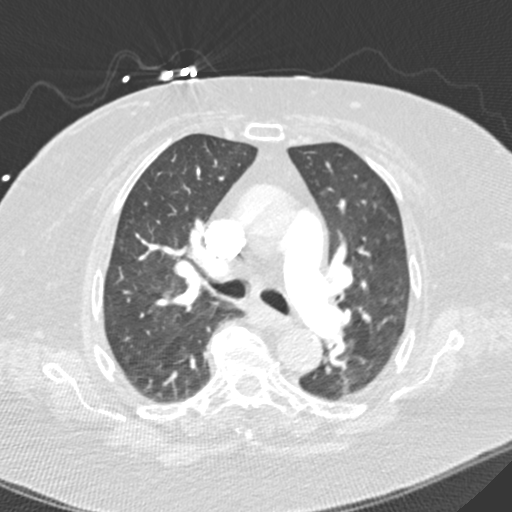
[im 194/269  soft-tissue]
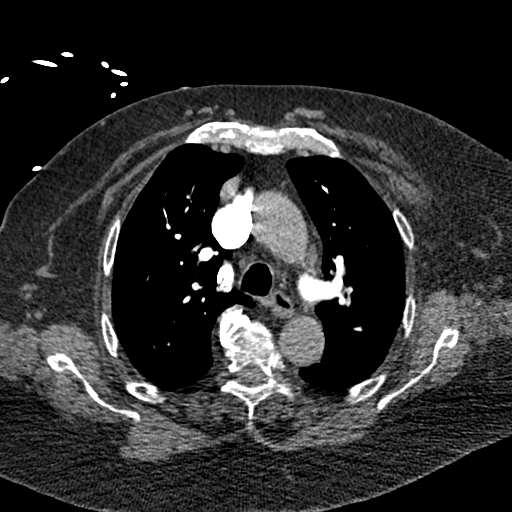
[im 209/269  lung]
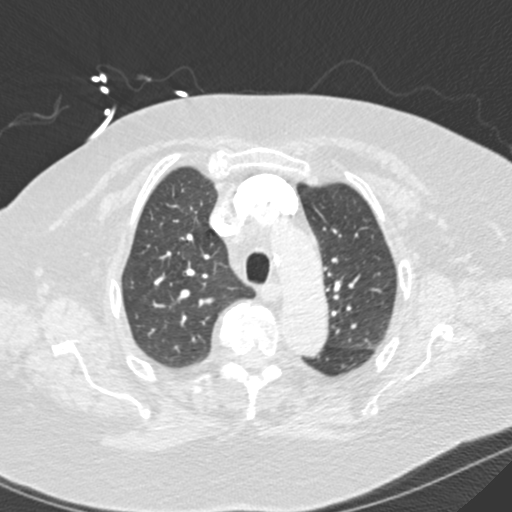
[im 224/269  soft-tissue]
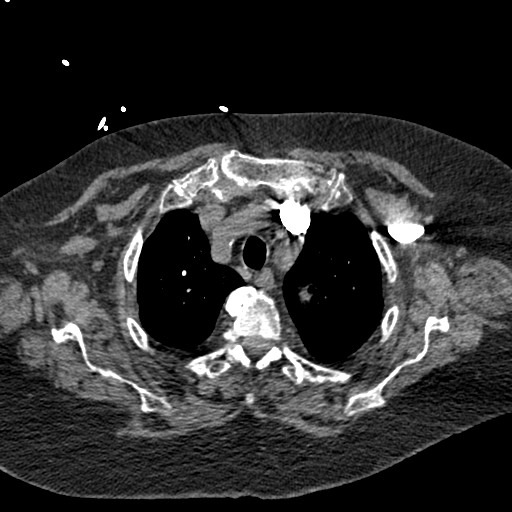
[im 239/269  lung]
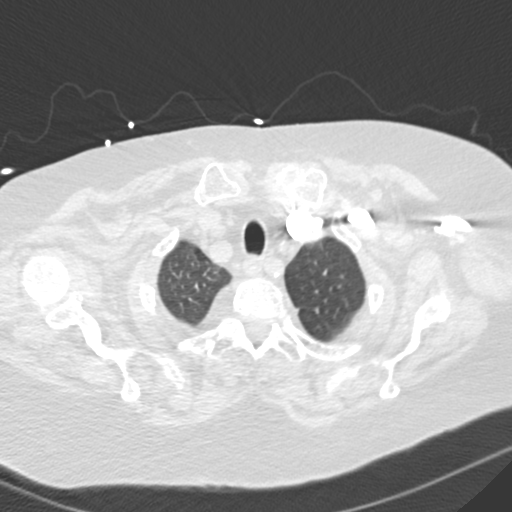
[im 254/269  soft-tissue]
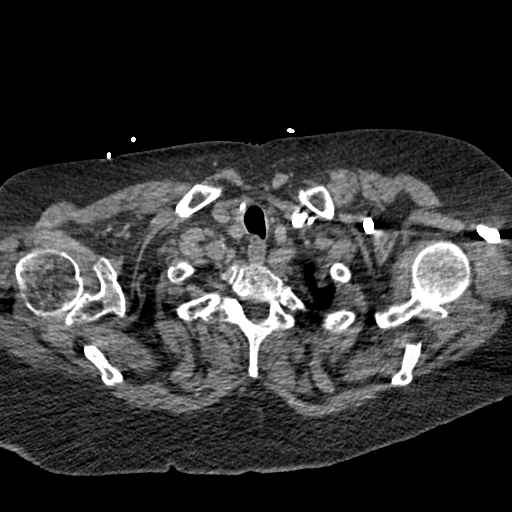

[Series 7: coronal mpr · coronal · 0.58mm/px · 3 of 110 slices shown]
[im 28/110  soft-tissue]
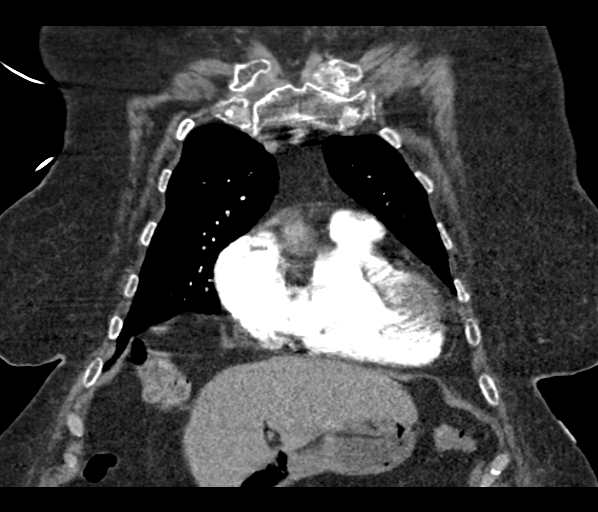
[im 55/110  soft-tissue]
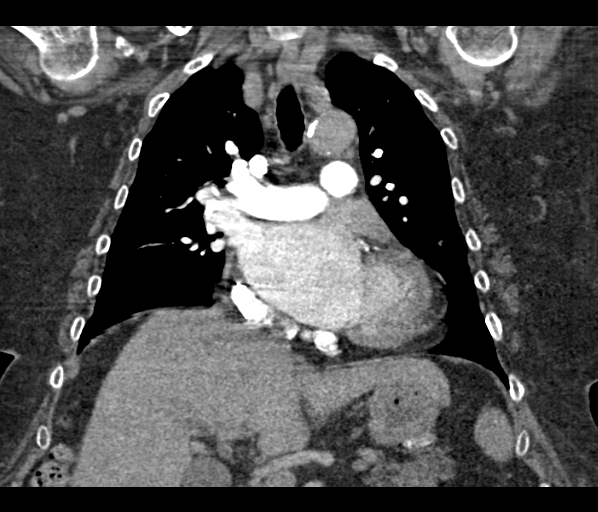
[im 82/110  soft-tissue]
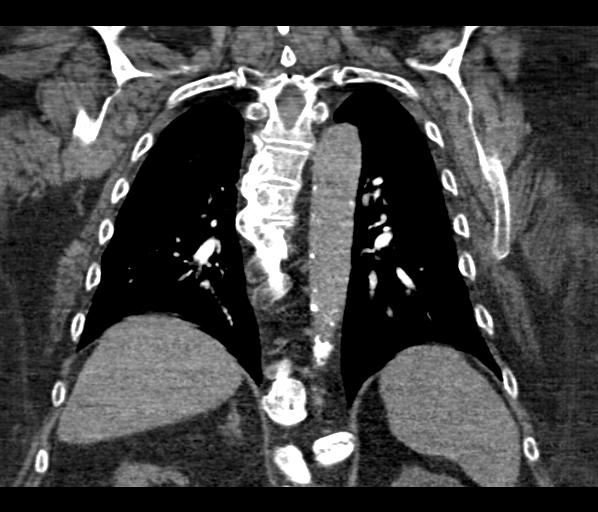

[19 of 46 positions shown; findings below may reference images not displayed]

FINDINGS: The lungs are well aerated bilaterally. No focal infiltrate or
sizable effusion is seen. The thoracic inlet is within normal
limits.

The thoracic aorta shows mild calcification without aneurysmal
dilatation. Pulmonary artery is well visualized and demonstrates no
evidence of pulmonary emboli. The small peripheral branches in the
lower lobes bilaterally are incompletely evaluated due to patient
motion artifact. No sizable hilar or mediastinal adenopathy is seen.
Scattered Coronary calcifications are noted.

Scanning into the upper abdomen reveals cholelithiasis. No
complicating factors are seen. The osseous structures show
degenerative change of the thoracic spine. Calcified lesion is noted
in the left breast. This may represent a cyst or previous hematoma

Review of the MIP images confirms the above findings.
IMPRESSION: No evidence of pulmonary emboli.  No acute abnormality is seen.

## 2016-07-07 ENCOUNTER — Other Ambulatory Visit: Payer: Self-pay | Admitting: Internal Medicine

## 2016-07-10 ENCOUNTER — Ambulatory Visit (INDEPENDENT_AMBULATORY_CARE_PROVIDER_SITE_OTHER): Payer: Medicare Other | Admitting: Pharmacist

## 2016-07-10 DIAGNOSIS — I4891 Unspecified atrial fibrillation: Secondary | ICD-10-CM

## 2016-07-10 DIAGNOSIS — I481 Persistent atrial fibrillation: Secondary | ICD-10-CM | POA: Diagnosis not present

## 2016-07-10 DIAGNOSIS — Z5181 Encounter for therapeutic drug level monitoring: Secondary | ICD-10-CM

## 2016-07-10 DIAGNOSIS — I4819 Other persistent atrial fibrillation: Secondary | ICD-10-CM

## 2016-07-10 LAB — POCT INR: INR: 2.1

## 2016-07-15 ENCOUNTER — Other Ambulatory Visit: Payer: Self-pay | Admitting: Family Medicine

## 2016-07-15 NOTE — Telephone Encounter (Signed)
Medication called to pharmacy. 

## 2016-07-15 NOTE — Telephone Encounter (Signed)
ok 

## 2016-07-15 NOTE — Telephone Encounter (Signed)
Ok to refill??  Last office visit 12/16/2015.  Last refill 04/13/2016, #2 refills.

## 2016-07-24 ENCOUNTER — Other Ambulatory Visit: Payer: Self-pay | Admitting: Internal Medicine

## 2016-07-29 ENCOUNTER — Ambulatory Visit
Admission: RE | Admit: 2016-07-29 | Discharge: 2016-07-29 | Disposition: A | Discharge status: Home / Self Care | Payer: Medicare Other | Source: Ambulatory Visit | Attending: Family Medicine | Admitting: Family Medicine

## 2016-07-29 DIAGNOSIS — Z1231 Encounter for screening mammogram for malignant neoplasm of breast: Secondary | ICD-10-CM | POA: Diagnosis not present

## 2016-08-03 ENCOUNTER — Other Ambulatory Visit: Payer: Self-pay | Admitting: Internal Medicine

## 2016-08-05 NOTE — Telephone Encounter (Signed)
Rx(s) sent to pharmacy electronically.  

## 2016-08-07 ENCOUNTER — Ambulatory Visit (INDEPENDENT_AMBULATORY_CARE_PROVIDER_SITE_OTHER): Payer: Medicare Other | Admitting: *Deleted

## 2016-08-07 DIAGNOSIS — I481 Persistent atrial fibrillation: Secondary | ICD-10-CM

## 2016-08-07 DIAGNOSIS — Z5181 Encounter for therapeutic drug level monitoring: Secondary | ICD-10-CM

## 2016-08-07 DIAGNOSIS — I4819 Other persistent atrial fibrillation: Secondary | ICD-10-CM

## 2016-08-07 DIAGNOSIS — I4891 Unspecified atrial fibrillation: Secondary | ICD-10-CM | POA: Diagnosis not present

## 2016-08-07 LAB — POCT INR: INR: 2.9

## 2016-08-22 ENCOUNTER — Other Ambulatory Visit: Payer: Self-pay | Admitting: Internal Medicine

## 2016-08-22 NOTE — Telephone Encounter (Signed)
REFILL 

## 2016-08-31 ENCOUNTER — Other Ambulatory Visit: Payer: Self-pay | Admitting: Internal Medicine

## 2016-09-03 NOTE — Telephone Encounter (Signed)
Rx(s) sent to pharmacy electronically.  

## 2016-09-05 ENCOUNTER — Other Ambulatory Visit: Payer: Self-pay | Admitting: Internal Medicine

## 2016-09-09 ENCOUNTER — Other Ambulatory Visit: Payer: Self-pay | Admitting: Family Medicine

## 2016-09-09 MED ORDER — AZITHROMYCIN 250 MG PO TABS: ORAL_TABLET | ORAL | 0 refills | Status: DC

## 2016-09-10 ENCOUNTER — Other Ambulatory Visit: Payer: Self-pay | Admitting: Internal Medicine

## 2016-09-11 ENCOUNTER — Ambulatory Visit (INDEPENDENT_AMBULATORY_CARE_PROVIDER_SITE_OTHER): Payer: Medicare Other | Admitting: *Deleted

## 2016-09-11 ENCOUNTER — Encounter (INDEPENDENT_AMBULATORY_CARE_PROVIDER_SITE_OTHER): Payer: Self-pay

## 2016-09-11 DIAGNOSIS — Z5181 Encounter for therapeutic drug level monitoring: Secondary | ICD-10-CM | POA: Diagnosis not present

## 2016-09-11 DIAGNOSIS — I481 Persistent atrial fibrillation: Secondary | ICD-10-CM

## 2016-09-11 DIAGNOSIS — I4891 Unspecified atrial fibrillation: Secondary | ICD-10-CM

## 2016-09-11 DIAGNOSIS — I4819 Other persistent atrial fibrillation: Secondary | ICD-10-CM

## 2016-09-11 LAB — POCT INR: INR: 3

## 2016-09-14 ENCOUNTER — Other Ambulatory Visit: Payer: Self-pay | Admitting: Nurse Practitioner

## 2016-09-18 ENCOUNTER — Ambulatory Visit (INDEPENDENT_AMBULATORY_CARE_PROVIDER_SITE_OTHER): Payer: Medicare Other | Admitting: *Deleted

## 2016-09-18 DIAGNOSIS — I4891 Unspecified atrial fibrillation: Secondary | ICD-10-CM

## 2016-09-18 DIAGNOSIS — Z5181 Encounter for therapeutic drug level monitoring: Secondary | ICD-10-CM | POA: Diagnosis not present

## 2016-09-18 DIAGNOSIS — I481 Persistent atrial fibrillation: Secondary | ICD-10-CM | POA: Diagnosis not present

## 2016-09-18 DIAGNOSIS — I4819 Other persistent atrial fibrillation: Secondary | ICD-10-CM

## 2016-09-18 LAB — POCT INR: INR: 2.8

## 2016-09-19 ENCOUNTER — Encounter: Payer: Self-pay | Admitting: Family Medicine

## 2016-09-19 ENCOUNTER — Ambulatory Visit (INDEPENDENT_AMBULATORY_CARE_PROVIDER_SITE_OTHER): Payer: Medicare Other | Admitting: Family Medicine

## 2016-09-19 ENCOUNTER — Telehealth: Payer: Self-pay | Admitting: *Deleted

## 2016-09-19 VITALS — BP 144/82 | HR 80 | Temp 97.9°F | Resp 18 | Ht <= 58 in | Wt 162.0 lb

## 2016-09-19 DIAGNOSIS — R05 Cough: Secondary | ICD-10-CM

## 2016-09-19 DIAGNOSIS — I2510 Atherosclerotic heart disease of native coronary artery without angina pectoris: Secondary | ICD-10-CM | POA: Diagnosis not present

## 2016-09-19 DIAGNOSIS — R059 Cough, unspecified: Secondary | ICD-10-CM

## 2016-09-19 DIAGNOSIS — E038 Other specified hypothyroidism: Secondary | ICD-10-CM | POA: Diagnosis not present

## 2016-09-19 LAB — CBC WITH DIFFERENTIAL/PLATELET
BASOS ABS: 0 {cells}/uL (ref 0–200)
Basophils Relative: 0 %
EOS PCT: 3 %
Eosinophils Absolute: 258 cells/uL (ref 15–500)
HEMATOCRIT: 43.4 % (ref 35.0–45.0)
Hemoglobin: 14.8 g/dL (ref 12.0–15.0)
Lymphocytes Relative: 23 %
Lymphs Abs: 1978 cells/uL (ref 850–3900)
MCH: 30.2 pg (ref 27.0–33.0)
MCHC: 34.1 g/dL (ref 32.0–36.0)
MCV: 88.6 fL (ref 80.0–100.0)
MONOS PCT: 6 %
MPV: 9.6 fL (ref 7.5–12.5)
Monocytes Absolute: 516 cells/uL (ref 200–950)
NEUTROS ABS: 5848 {cells}/uL (ref 1500–7800)
NEUTROS PCT: 68 %
Platelets: 208 10*3/uL (ref 140–400)
RBC: 4.9 MIL/uL (ref 3.80–5.10)
RDW: 13.8 % (ref 11.0–15.0)
WBC: 8.6 10*3/uL (ref 3.8–10.8)

## 2016-09-19 LAB — COMPLETE METABOLIC PANEL WITH GFR
ALT: 10 U/L (ref 6–29)
AST: 20 U/L (ref 10–35)
Albumin: 3.6 g/dL (ref 3.6–5.1)
Alkaline Phosphatase: 46 U/L (ref 33–130)
BILIRUBIN TOTAL: 1 mg/dL (ref 0.2–1.2)
BUN: 13 mg/dL (ref 7–25)
CALCIUM: 8.7 mg/dL (ref 8.6–10.4)
CHLORIDE: 108 mmol/L (ref 98–110)
CO2: 25 mmol/L (ref 20–31)
CREATININE: 0.73 mg/dL (ref 0.60–0.88)
GFR, EST AFRICAN AMERICAN: 89 mL/min (ref >=60)
GFR, Est Non African American: 77 mL/min (ref >=60)
Glucose, Bld: 82 mg/dL (ref 70–99)
Potassium: 3.8 mmol/L (ref 3.5–5.3)
Sodium: 145 mmol/L (ref 135–146)
TOTAL PROTEIN: 5.9 g/dL — AB (ref 6.1–8.1)

## 2016-09-19 LAB — LIPID PANEL
CHOLESTEROL: 106 mg/dL — AB (ref 125–200)
HDL: 43 mg/dL — ABNORMAL LOW (ref >=46)
LDL Cholesterol: 45 mg/dL (ref <130)
TRIGLYCERIDES: 89 mg/dL (ref <150)
Total CHOL/HDL Ratio: 2.5 Ratio (ref <=5.0)
VLDL: 18 mg/dL (ref <30)

## 2016-09-19 LAB — TSH: TSH: 1.27 mIU/L

## 2016-09-19 MED ORDER — BENZONATATE 100 MG PO CAPS: 200.0000 mg | ORAL_CAPSULE | Freq: Three times a day (TID) | ORAL | 0 refills | Status: DC | PRN

## 2016-09-19 NOTE — Progress Notes (Signed)
Subjective:    Patient ID: [name], female    DOB: 1933-08-28, 82 y.o.   MRN: [id number]  HPI I have not seen the patient in quite some time. Patient has a history of ASCVD as well as atrial fibrillation for which she takes Coumadin. 2 weeks ago, she developed a nonproductive cough. She denies any shortness of breath. She denies any fevers. She denies any chest pain. We originally called out a Z-Pak without examining the patient due to transportation issues. Patient states that the antibiotic did not help or hurt her situation. Today she sounds very congested in her nose. She reports postnasal drip. However her exam today is completely reassuring. There are no wheezes crackles Rales. The majority of her cough sounds like a tickle in her throat. Of note she is taking lisinopril. Her husband has had similar symptoms recently. Past Medical History:  Diagnosis Date  . A-fib ([place])    a. Dx 01/2015, CHA2DS2VASc = 5-->coumadin started.  Marland Kitchen CAD (coronary artery disease)    a. 01/2015 NSTEMI/PCI: LM nl, LAD 30p, LCX nondom, 30-40p, 50d, OM1 100p (2.75x16 Rebel BMS), RCA mild diff plaque, EF 55%.  . Candida infection, disseminated ([place])   . H/O echocardiogram    a. 01/2015 Echo: EF 50-55%, mild LVH, no rwma, mild MR, mildly dil LA, mod TR, PASP 53 mmHg.  Marland Kitchen Hyperlipidemia   . Hypertension   . Hypothyroid   . Osteoporosis   . Sleep apnea 04/2012   Mild/ AHI 10/CPAP 7cm h2o w/2 L o2   Past Surgical History:  Procedure Laterality Date  . CARDIOVERSION N/A 08/22/2015   Procedure: CARDIOVERSION;  Surgeon: Skeet Latch, MD;  Location: [facility];  Service: Cardiovascular;  Laterality: N/A;  . LEFT HEART CATHETERIZATION WITH CORONARY ANGIOGRAM N/A 02/20/2015   Procedure: LEFT HEART CATHETERIZATION WITH CORONARY ANGIOGRAM;  Surgeon: Jolaine Artist, MD;  Location: [facility] CATH LAB;  Service: Cardiovascular;  Laterality: N/A;  . PERCUTANEOUS CORONARY STENT INTERVENTION (PCI-S)  02/20/2015   Procedure: PERCUTANEOUS CORONARY STENT INTERVENTION (PCI-S);  Surgeon: Jolaine Artist, MD;  Location: [facility] CATH LAB;  Service: Cardiovascular;;  OM1  (2.75/81mm Rebel)   Current Outpatient Prescriptions on File Prior to Visit  Medication Sig Dispense Refill  . ALPRAZolam (XANAX) 0.25 MG tablet TAKE 1 TABLET AT BEDTIME AS NEEDED FOR SLEEP 30 tablet 2  . amLODipine (NORVASC) 5 MG tablet Take 1 tablet (5 mg total) by mouth daily. KEEP OV. 90 tablet 0  . atorvastatin (LIPITOR) 80 MG tablet TAKE 1 TABLET (80 MG TOTAL) BY MOUTH DAILY AT 6 PM. PLEASE SCHEDULE APPOINTMENT FOR REFILLS. 60 tablet 0  . azithromycin (ZITHROMAX) 250 MG tablet 2 tabs poqday1, 1 tab poqday 2-5 (Patient not taking: Reported on 09/19/2016) 6 tablet 0  . calcium carbonate 1250 MG capsule Take 1,250 mg by mouth 2 (two) times daily with a meal.    . cholecalciferol (VITAMIN D) 1000 UNITS tablet Take 1,000 Units by mouth daily.    . clopidogrel (PLAVIX) 75 MG tablet Take 1 tablet (75 mg total) by mouth daily. PLEASE SCHEDULE AN APPT FOR FURTHER REFILLS - [phone] 2nd attempt 30 tablet 0  . clotrimazole-betamethasone (LOTRISONE) cream Apply 1 application topically 2 (two) times daily. 30 g 2  . cyanocobalamin 1000 MCG tablet Take 100 mcg by mouth daily.    Marland Kitchen donepezil (ARICEPT) 5 MG tablet Take 1 tablet (5 mg total) by mouth at bedtime. 90 tablet 3  . levothyroxine (SYNTHROID, LEVOTHROID) 137 MCG tablet TAKE  1 TABLET EVERY DAY 90 tablet 3  . lisinopril (PRINIVIL,ZESTRIL) 5 MG tablet Take 1 tablet (5 mg total) by mouth daily. Please schedule appointment for refills. - 2nd attempt 30 tablet 0  . meclizine (ANTIVERT) 25 MG tablet TAKE 1 TABLET (25 MG TOTAL) BY MOUTH 3 (THREE) TIMES DAILY AS NEEDED FOR DIZZINESS. 30 tablet 0  . metoprolol (LOPRESSOR) 50 MG tablet Take 1 tablet (50 mg total) by mouth 2 (two) times daily. PLEASE CONTACT OFFICE FOR ADDITIONAL REFILLS 2ND ATTEMPT 15 tablet 0  . NITROSTAT 0.4 MG SL tablet PLACE 1 TAB UNDER  THE TONGUE EVERY 5 MIN X 3 DOSES AS NEEDED FOR CHEST PAIN 25 tablet 1  . pantoprazole (PROTONIX) 40 MG tablet TAKE 1 TABLET BY MOUTH EVERY DAY 30 tablet 1  . raloxifene (EVISTA) 60 MG tablet TAKE 1 TABLET (60 MG TOTAL) BY MOUTH DAILY. 90 tablet 3  . traMADol (ULTRAM) 50 MG tablet TAKE 1 TABLET BY MOUTH EVERY 6 HOURS AS NEEDED FOR PAIN (MUST LAST 30 DAYS) 60 tablet 2  . warfarin (COUMADIN) 1 MG tablet TAKE 2-3 TABLETS BY MOUTH DAILY AS DIRECTED BY COUMADIN CLINIC 180 tablet 1   No current facility-administered medications on file prior to visit.    Allergies  Allergen Reactions  . Fosamax [Alendronate Sodium]    Social History   Social History  . Marital status: Married    Spouse name: N/A  . Number of children: N/A  . Years of education: N/A   Occupational History  . Not on file.   Social History Main Topics  . Smoking status: Former Smoker    Quit date: 04/28/1995  . Smokeless tobacco: Never Used  . Alcohol use No  . Drug use: No  . Sexual activity: Not on file   Other Topics Concern  . Not on file   Social History Narrative  . No narrative on file      Review of Systems  All other systems reviewed and are negative.      Objective:   Physical Exam  Constitutional: She appears well-developed and well-nourished.  HENT:  Right Ear: External ear normal.  Left Ear: External ear normal.  Nose: Mucosal edema and rhinorrhea present.  Mouth/Throat: Oropharynx is clear and moist. No oropharyngeal exudate.  Eyes: Conjunctivae are normal.  Neck: Neck supple.  Cardiovascular: Normal rate and normal heart sounds.   No murmur heard. Pulmonary/Chest: Effort normal and breath sounds normal. No respiratory distress. She has no wheezes. She has no rales. She exhibits no tenderness.  Abdominal: Soft. Bowel sounds are normal.  Lymphadenopathy:    She has no cervical adenopathy.  Vitals reviewed.         Assessment & Plan:  Cough - Plan: benzonatate (TESSALON) 100 MG  capsule  ASCVD (arteriosclerotic cardiovascular disease) - Plan: CBC with Differential/Platelet, COMPLETE METABOLIC PANEL WITH GFR, Lipid panel  Other specified hypothyroidism - Plan: TSH  I believe her cough is due to postnasal drip and drainage. My nurse checked her blood pressure and it was very high on intake. After sitting in the exam room I rechecked her blood pressure and it was 144/82. I will have the patient take Tessalon Perles 200 mg every 8 hours as needed for cough. At the present time there is no indication for bronchitis or pneumonia. There is no sign of a sinus infection. Her blood pressures adequately controlled today. She is overdue for fasting lipid panel given her history of ASCVD. Her goal LDL cholesterol is  less than 70. She is also overdue to recheck a TSH and she is also complaining of fatigue so we'll check a CBC. I recommended a flu shot when she is feeling better

## 2016-09-19 NOTE — Telephone Encounter (Signed)
Returned call to the pt after checking voicemail and the pt confirmed that she has been prescribed Benzonatate 100mg  & she is taking 2 tablets (200mg ) as needed three times a day as needed for cough. Advised that the medication will not interfere with Coumadin & that it is safe to take with Coumadin.  She verbalized understanding & was very appreciative for the call back. Reminded pt to call back with any new meds or procedures & she verbalized understanding.

## 2016-09-22 ENCOUNTER — Other Ambulatory Visit: Payer: Self-pay | Admitting: Internal Medicine

## 2016-09-24 ENCOUNTER — Ambulatory Visit (INDEPENDENT_AMBULATORY_CARE_PROVIDER_SITE_OTHER): Payer: Medicare Other

## 2016-09-24 DIAGNOSIS — Z23 Encounter for immunization: Secondary | ICD-10-CM

## 2016-10-03 ENCOUNTER — Ambulatory Visit (INDEPENDENT_AMBULATORY_CARE_PROVIDER_SITE_OTHER): Payer: Medicare Other | Admitting: Internal Medicine

## 2016-10-03 ENCOUNTER — Encounter: Payer: Self-pay | Admitting: Internal Medicine

## 2016-10-03 VITALS — BP 147/79 | HR 88 | Ht 60.0 in | Wt 161.8 lb

## 2016-10-03 DIAGNOSIS — I481 Persistent atrial fibrillation: Secondary | ICD-10-CM | POA: Diagnosis not present

## 2016-10-03 DIAGNOSIS — I350 Nonrheumatic aortic (valve) stenosis: Secondary | ICD-10-CM

## 2016-10-03 DIAGNOSIS — R011 Cardiac murmur, unspecified: Secondary | ICD-10-CM | POA: Diagnosis not present

## 2016-10-03 DIAGNOSIS — I10 Essential (primary) hypertension: Secondary | ICD-10-CM

## 2016-10-03 DIAGNOSIS — I4819 Other persistent atrial fibrillation: Secondary | ICD-10-CM

## 2016-10-03 MED ORDER — METOPROLOL TARTRATE 50 MG PO TABS: 50.0000 mg | ORAL_TABLET | Freq: Two times a day (BID) | ORAL | 3 refills | Status: DC

## 2016-10-03 NOTE — Progress Notes (Signed)
OFFICE NOTE  Chief Complaint:  Follow-up a-fib, aortic stenosis  Primary Care Physician: Odette Fraction, MD  HPI:  [name] is an 82 yo female with a prior history of hypertension, hyperlipidemia, hypothyroidism, sleep apnea, and osteoporosis in the setting of chronic steroid therapy. She was recently admitted to [facility] with severe chest and back discomfort. EKG showed rate-controlled atrial fibrillation which was a new diagnosis. She ruled in for MI. Echo showed normal LV function. Cath revealed an occluded obtuse marginal branch of the left circumflex with other wise nonobstructive disease and normal LV function. The obtuse marginal was successfully stented using a 2.75 x 16 mm Rebel BMS. She did well postprocedure. With new onset A. fib, she was placed on Coumadin with a plan to continue aspirin, Plavix, and warfarin for 1 month and then to discontinue aspirin at that time.  Since discharge, she has done well. She has not had any chest pain or dyspnea. She says that she has been taking it fairly easy because she was.  She denies chest pain, palpitations, dyspnea, pnd, orthopnea, n, v, dizziness, syncope, edema, weight gain, or early satiety.  Right groin catheterization site has been healing well. She was noted to have significant tibial infection with sores under both breasts. She was given Diflucan in the hospital but no further treatment was initiated. She also had new onset atrial fibrillation and has been on warfarin. There've been no attempts at cardioversion.  [name] returns today for follow-up. She is in persistent A. fib. She's currently on Plavix and warfarin. I reviewed her INRs which are been followed at [facility] and they indicate that she's been subtherapeutic several times. We cannot continue with cardioversion at this time. I've asked Erasmo Downer, our anticoagulation pharmacist at [facility] to possibly follow her for the next month to try to get her INR  therapeutic. She continues to feel somewhat fatigued and may benefit from cardioversion.   I head pleasure seeing [name] back in the office today. She recently underwent elective cardioversion by my partner Dr. Oval Linsey. Unfortunately this was unsuccessful and she remains in atrial fibrillation. She says however she does feel stronger and she is asymptomatic at this time. She's had resolution of her candidal infection under her breasts with a treatment of a topical anti-fungal. INR is mildly supratherapeutic.  10/03/2016  [name] returns today for follow-up. Recently she had an upper respiratory infection and blood pressure was noted to be elevated. This is mostly resolved however blood pressure still is elevated and heart rate is in the upper 80s. I cut back her beta blocker in the past however it looks like she may need to be on the full dose now. In addition she has allowed her aortic stenosis murmur today. Her last echo was in February 2016. She reports some fatigue however is basically inactive at home. I have encouraged exercise.  PMHx:  Past Medical History:  Diagnosis Date  . A-fib ([place])    a. Dx 01/2015, CHA2DS2VASc = 5-->coumadin started.  Marland Kitchen CAD (coronary artery disease)    a. 01/2015 NSTEMI/PCI: LM nl, LAD 30p, LCX nondom, 30-40p, 50d, OM1 100p (2.75x16 Rebel BMS), RCA mild diff plaque, EF 55%.  . Candida infection, disseminated ([place])   . H/O echocardiogram    a. 01/2015 Echo: EF 50-55%, mild LVH, no rwma, mild MR, mildly dil LA, mod TR, PASP 53 mmHg.  Marland Kitchen Hyperlipidemia   . Hypertension   . Hypothyroid   . Osteoporosis   .  Sleep apnea 04/2012   Mild/ AHI 10/CPAP 7cm h2o w/2 L o2    Past Surgical History:  Procedure Laterality Date  . CARDIOVERSION N/A 08/22/2015   Procedure: CARDIOVERSION;  Surgeon: Skeet Latch, MD;  Location: [place];  Service: Cardiovascular;  Laterality: N/A;  . LEFT HEART CATHETERIZATION WITH CORONARY ANGIOGRAM N/A 02/20/2015   Procedure:  LEFT HEART CATHETERIZATION WITH CORONARY ANGIOGRAM;  Surgeon: Jolaine Artist, MD;  Location: [facility] CATH LAB;  Service: Cardiovascular;  Laterality: N/A;  . PERCUTANEOUS CORONARY STENT INTERVENTION (PCI-S)  02/20/2015   Procedure: PERCUTANEOUS CORONARY STENT INTERVENTION (PCI-S);  Surgeon: Jolaine Artist, MD;  Location: [facility] CATH LAB;  Service: Cardiovascular;;  OM1  (2.75/13mm Rebel)    FAMHx:  Family History  Problem Relation Age of Onset  . Coronary artery disease Father   . Diabetes Father   . Stroke Mother   . Arthritis Mother     SOCHx:   reports that she quit smoking about 21 years ago. She has never used smokeless tobacco. She reports that she does not drink alcohol or use drugs.  ALLERGIES:  Allergies  Allergen Reactions  . Fosamax [Alendronate Sodium]     ROS: A comprehensive review of systems was negative.  HOME MEDS: Current Outpatient Prescriptions  Medication Sig Dispense Refill  . ALPRAZolam (XANAX) 0.25 MG tablet TAKE 1 TABLET AT BEDTIME AS NEEDED FOR SLEEP 30 tablet 2  . amLODipine (NORVASC) 5 MG tablet Take 1 tablet (5 mg total) by mouth daily. KEEP OV. 90 tablet 0  . atorvastatin (LIPITOR) 80 MG tablet TAKE 1 TABLET (80 MG TOTAL) BY MOUTH DAILY AT 6 PM. PLEASE SCHEDULE APPOINTMENT FOR REFILLS. 60 tablet 0  . azithromycin (ZITHROMAX) 250 MG tablet 2 tabs poqday1, 1 tab poqday 2-5 6 tablet 0  . benzonatate (TESSALON) 100 MG capsule Take 2 capsules (200 mg total) by mouth 3 (three) times daily as needed for cough. 30 capsule 0  . calcium carbonate 1250 MG capsule Take 1,250 mg by mouth 2 (two) times daily with a meal.    . cholecalciferol (VITAMIN D) 1000 UNITS tablet Take 1,000 Units by mouth daily.    . clopidogrel (PLAVIX) 75 MG tablet Take 1 tablet (75 mg total) by mouth daily. PLEASE SCHEDULE AN APPT FOR FURTHER REFILLS - [phone] 2nd attempt 30 tablet 0  . clotrimazole-betamethasone (LOTRISONE) cream Apply 1 application topically 2 (two) times daily. 30  g 2  . cyanocobalamin 1000 MCG tablet Take 100 mcg by mouth daily.    Marland Kitchen donepezil (ARICEPT) 5 MG tablet Take 1 tablet (5 mg total) by mouth at bedtime. 90 tablet 3  . levothyroxine (SYNTHROID, LEVOTHROID) 137 MCG tablet TAKE 1 TABLET EVERY DAY 90 tablet 3  . lisinopril (PRINIVIL,ZESTRIL) 5 MG tablet Take 1 tablet (5 mg total) by mouth daily. Please schedule appointment for refills. - 2nd attempt 30 tablet 0  . meclizine (ANTIVERT) 25 MG tablet TAKE 1 TABLET (25 MG TOTAL) BY MOUTH 3 (THREE) TIMES DAILY AS NEEDED FOR DIZZINESS. 30 tablet 0  . metoprolol (LOPRESSOR) 50 MG tablet Take 1 tablet (50 mg total) by mouth 2 (two) times daily. 60 tablet 3  . NITROSTAT 0.4 MG SL tablet PLACE 1 TAB UNDER THE TONGUE EVERY 5 MIN X 3 DOSES AS NEEDED FOR CHEST PAIN 25 tablet 1  . pantoprazole (PROTONIX) 40 MG tablet TAKE 1 TABLET BY MOUTH EVERY DAY 30 tablet 1  . raloxifene (EVISTA) 60 MG tablet TAKE 1 TABLET (60 MG TOTAL) BY MOUTH  DAILY. 90 tablet 3  . traMADol (ULTRAM) 50 MG tablet TAKE 1 TABLET BY MOUTH EVERY 6 HOURS AS NEEDED FOR PAIN (MUST LAST 30 DAYS) 60 tablet 2  . warfarin (COUMADIN) 1 MG tablet TAKE 2-3 TABLETS BY MOUTH DAILY AS DIRECTED BY COUMADIN CLINIC 180 tablet 1   No current facility-administered medications for this visit.     LABS/IMAGING: No results found for this or any previous visit (from the past 48 hour(s)). No results found.  WEIGHTS: Wt Readings from Last 3 Encounters:  10/03/16 161 lb 12.8 oz (73.4 kg)  09/19/16 162 lb (73.5 kg)  09/11/15 162 lb 1.6 oz (73.5 kg)    VITALS: BP (!) 147/79   Pulse 88   Ht 5' (1.524 m)   Wt 161 lb 12.8 oz (73.4 kg)   BMI 31.60 kg/m   EXAM: General appearance: alert and no distress Lungs: clear to auscultation bilaterally Heart: regular rate and rhythm, S1, S2 normal, no murmur, click, rub or gallop Extremities: extremities normal, atraumatic, no cyanosis or edema Neurologic: Grossly normal  EKG: Atrial fibrillation at 88, nonspecific  t wave changes  ASSESSMENT: 1. NSTEMI-status post bare metal stent to obtuse marginal 2. Persistent atrial fibrillation on warfarin -  Failed cardioversion, ?symptomatic - CHADVASC 5 3. Dyslipidemia 4. Hypertension 5. Aortic stenosis 6. Memory loss, question early dementia  PLAN: 1.   [name] has persistent a-fib with failed cardioversion and will be managed with rate control and anticoagulation. Plan to continue Plavix for CAD and warfarin anticoagulation. BP still somewhat high - increased metoprolol back to 50 mg BID. Will need a repeat echo for aortic stenosis - which was mild at last check.  Follow-up with me in annually.  Pixie Casino, MD, [facility] Attending Cardiologist [place] 10/03/2016, 10:02 AM

## 2016-10-03 NOTE — Patient Instructions (Signed)
Medication Instructions:  Increase Metoprolol to 50mg  Take 1 tablet by mouth twice a day   Labwork: None   Testing/Procedures: Your physician has requested that you have an echocardiogram. Echocardiography is a painless test that uses sound waves to create images of your heart. It provides your doctor with information about the size and shape of your heart and how well your heart's chambers and valves are working. This procedure takes approximately one hour. There are no restrictions for this procedure.  Follow-Up: Your physician wants you to follow-up in: 12 months with Dr Debara Pickett. You will receive a reminder letter in the mail two months in advance. If you don't receive a letter, please call our office to schedule the follow-up appointment.  Any Other Special Instructions Will Be Listed Below (If Applicable).  We will call you with the results of your ECHO.  If you need a refill on your cardiac medications before your next appointment, please call your pharmacy.

## 2016-10-06 ENCOUNTER — Other Ambulatory Visit: Payer: Self-pay | Admitting: Internal Medicine

## 2016-10-16 ENCOUNTER — Other Ambulatory Visit: Payer: Self-pay

## 2016-10-16 ENCOUNTER — Ambulatory Visit (INDEPENDENT_AMBULATORY_CARE_PROVIDER_SITE_OTHER): Payer: Medicare Other | Admitting: *Deleted

## 2016-10-16 ENCOUNTER — Ambulatory Visit (HOSPITAL_COMMUNITY): Payer: Medicare Other | Attending: Cardiology

## 2016-10-16 DIAGNOSIS — I2510 Atherosclerotic heart disease of native coronary artery without angina pectoris: Secondary | ICD-10-CM | POA: Diagnosis not present

## 2016-10-16 DIAGNOSIS — I119 Hypertensive heart disease without heart failure: Secondary | ICD-10-CM | POA: Insufficient documentation

## 2016-10-16 DIAGNOSIS — G4730 Sleep apnea, unspecified: Secondary | ICD-10-CM | POA: Insufficient documentation

## 2016-10-16 DIAGNOSIS — E785 Hyperlipidemia, unspecified: Secondary | ICD-10-CM | POA: Diagnosis not present

## 2016-10-16 DIAGNOSIS — I481 Persistent atrial fibrillation: Secondary | ICD-10-CM | POA: Diagnosis not present

## 2016-10-16 DIAGNOSIS — I350 Nonrheumatic aortic (valve) stenosis: Secondary | ICD-10-CM | POA: Diagnosis not present

## 2016-10-16 DIAGNOSIS — Z5181 Encounter for therapeutic drug level monitoring: Secondary | ICD-10-CM | POA: Diagnosis not present

## 2016-10-16 DIAGNOSIS — I071 Rheumatic tricuspid insufficiency: Secondary | ICD-10-CM | POA: Insufficient documentation

## 2016-10-16 DIAGNOSIS — I4891 Unspecified atrial fibrillation: Secondary | ICD-10-CM

## 2016-10-16 DIAGNOSIS — R011 Cardiac murmur, unspecified: Secondary | ICD-10-CM | POA: Diagnosis not present

## 2016-10-16 DIAGNOSIS — E039 Hypothyroidism, unspecified: Secondary | ICD-10-CM | POA: Insufficient documentation

## 2016-10-16 DIAGNOSIS — I4819 Other persistent atrial fibrillation: Secondary | ICD-10-CM

## 2016-10-16 DIAGNOSIS — I352 Nonrheumatic aortic (valve) stenosis with insufficiency: Secondary | ICD-10-CM | POA: Insufficient documentation

## 2016-10-16 LAB — POCT INR: INR: 3.2

## 2016-10-17 ENCOUNTER — Other Ambulatory Visit (HOSPITAL_COMMUNITY): Payer: Medicare Other

## 2016-10-31 ENCOUNTER — Other Ambulatory Visit: Payer: Self-pay | Admitting: Internal Medicine

## 2016-11-01 ENCOUNTER — Other Ambulatory Visit: Payer: Self-pay | Admitting: Internal Medicine

## 2016-11-04 ENCOUNTER — Other Ambulatory Visit: Payer: Self-pay | Admitting: Family Medicine

## 2016-11-06 ENCOUNTER — Ambulatory Visit (INDEPENDENT_AMBULATORY_CARE_PROVIDER_SITE_OTHER): Payer: Medicare Other | Admitting: *Deleted

## 2016-11-06 DIAGNOSIS — Z5181 Encounter for therapeutic drug level monitoring: Secondary | ICD-10-CM

## 2016-11-06 DIAGNOSIS — I4819 Other persistent atrial fibrillation: Secondary | ICD-10-CM

## 2016-11-06 DIAGNOSIS — I4891 Unspecified atrial fibrillation: Secondary | ICD-10-CM

## 2016-11-06 DIAGNOSIS — I481 Persistent atrial fibrillation: Secondary | ICD-10-CM | POA: Diagnosis not present

## 2016-11-06 LAB — POCT INR: INR: 3.7

## 2016-11-19 ENCOUNTER — Ambulatory Visit (INDEPENDENT_AMBULATORY_CARE_PROVIDER_SITE_OTHER): Payer: Medicare Other | Admitting: *Deleted

## 2016-11-19 DIAGNOSIS — I4891 Unspecified atrial fibrillation: Secondary | ICD-10-CM

## 2016-11-19 DIAGNOSIS — I481 Persistent atrial fibrillation: Secondary | ICD-10-CM | POA: Diagnosis not present

## 2016-11-19 DIAGNOSIS — Z5181 Encounter for therapeutic drug level monitoring: Secondary | ICD-10-CM

## 2016-11-19 DIAGNOSIS — I4819 Other persistent atrial fibrillation: Secondary | ICD-10-CM

## 2016-11-19 LAB — POCT INR: INR: 2.8

## 2016-11-26 ENCOUNTER — Telehealth: Payer: Self-pay | Admitting: Family Medicine

## 2016-11-26 DIAGNOSIS — R059 Cough, unspecified: Secondary | ICD-10-CM

## 2016-11-26 DIAGNOSIS — R05 Cough: Secondary | ICD-10-CM

## 2016-11-26 MED ORDER — BENZONATATE 200 MG PO CAPS: 200.0000 mg | ORAL_CAPSULE | Freq: Three times a day (TID) | ORAL | 0 refills | Status: DC

## 2016-11-26 NOTE — Telephone Encounter (Signed)
Recommendations for her?

## 2016-11-26 NOTE — Telephone Encounter (Signed)
Patients husband called in states patient has a bad cough for 3 days would like a cough medicine called into [facility] [address]  CB#[phone]

## 2016-11-26 NOTE — Telephone Encounter (Signed)
Tessalon perles 200 mg q 8 hrs prn cough (30)

## 2016-11-26 NOTE — Telephone Encounter (Signed)
Pt aware and med sent to pharm 

## 2016-11-26 NOTE — Telephone Encounter (Signed)
Medication called/sent to requested pharmacy - tried to call pt - line busy

## 2016-11-30 ENCOUNTER — Other Ambulatory Visit: Payer: Self-pay | Admitting: Internal Medicine

## 2016-12-03 ENCOUNTER — Ambulatory Visit (INDEPENDENT_AMBULATORY_CARE_PROVIDER_SITE_OTHER): Payer: Medicare Other | Admitting: *Deleted

## 2016-12-03 DIAGNOSIS — Z5181 Encounter for therapeutic drug level monitoring: Secondary | ICD-10-CM | POA: Diagnosis not present

## 2016-12-03 DIAGNOSIS — I4819 Other persistent atrial fibrillation: Secondary | ICD-10-CM

## 2016-12-03 DIAGNOSIS — I4891 Unspecified atrial fibrillation: Secondary | ICD-10-CM

## 2016-12-03 DIAGNOSIS — I481 Persistent atrial fibrillation: Secondary | ICD-10-CM

## 2016-12-03 LAB — POCT INR: INR: 3.7

## 2016-12-04 ENCOUNTER — Ambulatory Visit (INDEPENDENT_AMBULATORY_CARE_PROVIDER_SITE_OTHER): Payer: Medicare Other | Admitting: Family Medicine

## 2016-12-04 ENCOUNTER — Encounter: Payer: Self-pay | Admitting: Family Medicine

## 2016-12-04 VITALS — BP 142/78 | HR 88 | Temp 98.4°F | Resp 18 | Ht 60.0 in | Wt 161.0 lb

## 2016-12-04 DIAGNOSIS — J209 Acute bronchitis, unspecified: Secondary | ICD-10-CM

## 2016-12-04 MED ORDER — GUAIFENESIN-CODEINE 100-10 MG/5ML PO SOLN: 5.0000 mL | Freq: Four times a day (QID) | ORAL | 0 refills | Status: DC | PRN

## 2016-12-04 MED ORDER — AZITHROMYCIN 250 MG PO TABS: ORAL_TABLET | ORAL | 0 refills | Status: DC

## 2016-12-04 NOTE — Progress Notes (Signed)
   Subjective:    Patient ID: [name], female    DOB: February 10, 1933, 82 y.o.   MRN: [id number]  Patient presents for Cough (x4 weeks- productive cough with white mucus)  Cough with production for the past 4 weeks. Cough keeps her up at night. She denies any chest pain does not get short of breath but has multiple cough fits. She denies any fever. She did take the [facility] but that has not helped. She was seen in September for cough at that time had postnasal drip she still has some runny nose but those symptoms did improve. She denies any reflux symptoms.   Review Of Systems:  GEN- denies fatigue, fever, weight loss,weakness, recent illness HEENT- denies eye drainage, change in vision,+ nasal discharge, CVS- denies chest pain, palpitations RESP- denies SOB, +cough, wheeze ABD- denies N/V, change in stools, abd pain GU- denies dysuria, hematuria, dribbling, incontinence MSK- denies joint pain, muscle aches, injury Neuro- denies headache, dizziness, syncope, seizure activity       Objective:    BP (!) 142/78 (BP Location: Left Arm, Patient Position: Sitting, Cuff Size: Large)   Pulse 88   Temp 98.4 F (36.9 C) (Oral)   Resp 18   Ht 5' (1.524 m)   Wt 161 lb (73 kg)   SpO2 98%   BMI 31.44 kg/m  GEN- NAD, alert and oriented x3 HEENT- PERRL, EOMI, non injected sclera, pink conjunctiva, MMM, oropharynx clear Neck- Supple, no  LAD  CVS- RRR, no murmur RESP mild upper airway congestion, no wheeze  ABD-NABS,soft,NT,ND EXT- No edema Pulses- Radial, DP- 2+        Assessment & Plan:      Problem List Items Addressed This Visit    None    Visit Diagnoses    Acute bronchitis, unspecified organism    -  Primary   Treat as bronchitis, oxygen sat are good, no lower lobe findings but high risk for PNA, start zpak, robitussun DM, if OTC doesnt work given robitussin AC , CXR if not improved       Note: This dictation was prepared with Diplomatic Services operational officer dictation along with  smaller Company secretary. Any transcriptional errors that result from this process are unintentional.

## 2016-12-04 NOTE — Patient Instructions (Signed)
Take robitussin DM for cough Take the zpak  Use the codiene cough medicine if the over the counter does not help

## 2016-12-17 ENCOUNTER — Ambulatory Visit (INDEPENDENT_AMBULATORY_CARE_PROVIDER_SITE_OTHER): Payer: Medicare Other | Admitting: *Deleted

## 2016-12-17 DIAGNOSIS — I4891 Unspecified atrial fibrillation: Secondary | ICD-10-CM

## 2016-12-17 DIAGNOSIS — I481 Persistent atrial fibrillation: Secondary | ICD-10-CM

## 2016-12-17 DIAGNOSIS — Z5181 Encounter for therapeutic drug level monitoring: Secondary | ICD-10-CM

## 2016-12-17 DIAGNOSIS — I4819 Other persistent atrial fibrillation: Secondary | ICD-10-CM

## 2016-12-17 LAB — POCT INR: INR: 2.2

## 2016-12-24 ENCOUNTER — Other Ambulatory Visit: Payer: Self-pay | Admitting: Internal Medicine

## 2016-12-24 ENCOUNTER — Other Ambulatory Visit: Payer: Self-pay | Admitting: Family Medicine

## 2016-12-24 NOTE — Telephone Encounter (Signed)
REFILL 

## 2016-12-25 NOTE — Telephone Encounter (Signed)
ok 

## 2016-12-25 NOTE — Telephone Encounter (Signed)
Ok to refill 

## 2017-01-07 ENCOUNTER — Ambulatory Visit (INDEPENDENT_AMBULATORY_CARE_PROVIDER_SITE_OTHER): Payer: Medicare Other

## 2017-01-07 DIAGNOSIS — I4891 Unspecified atrial fibrillation: Secondary | ICD-10-CM

## 2017-01-07 DIAGNOSIS — Z5181 Encounter for therapeutic drug level monitoring: Secondary | ICD-10-CM | POA: Diagnosis not present

## 2017-01-07 DIAGNOSIS — I481 Persistent atrial fibrillation: Secondary | ICD-10-CM | POA: Diagnosis not present

## 2017-01-07 DIAGNOSIS — I4819 Other persistent atrial fibrillation: Secondary | ICD-10-CM

## 2017-01-07 LAB — POCT INR: INR: 3.7

## 2017-01-21 ENCOUNTER — Ambulatory Visit (INDEPENDENT_AMBULATORY_CARE_PROVIDER_SITE_OTHER): Payer: Medicare Other | Admitting: Pharmacist

## 2017-01-21 DIAGNOSIS — I4891 Unspecified atrial fibrillation: Secondary | ICD-10-CM

## 2017-01-21 DIAGNOSIS — I4819 Other persistent atrial fibrillation: Secondary | ICD-10-CM

## 2017-01-21 DIAGNOSIS — Z5181 Encounter for therapeutic drug level monitoring: Secondary | ICD-10-CM

## 2017-01-21 DIAGNOSIS — I481 Persistent atrial fibrillation: Secondary | ICD-10-CM

## 2017-01-21 LAB — POCT INR: INR: 2.5

## 2017-01-26 ENCOUNTER — Other Ambulatory Visit: Payer: Self-pay | Admitting: Internal Medicine

## 2017-01-29 ENCOUNTER — Telehealth: Payer: Self-pay | Admitting: Family Medicine

## 2017-01-29 NOTE — Telephone Encounter (Signed)
Per WTP use Miralax bid and Dulcolax suppository as directed and if better by Thursday she can call and cxd appt. Pt verbalizes understanding.

## 2017-01-29 NOTE — Telephone Encounter (Signed)
Pt states she is unable to have a bowel movement I scheduled her for an appointment, she wants to know what she can do for relief until she comes in.

## 2017-01-31 ENCOUNTER — Ambulatory Visit: Payer: Medicare Other | Admitting: Family Medicine

## 2017-02-18 ENCOUNTER — Ambulatory Visit (INDEPENDENT_AMBULATORY_CARE_PROVIDER_SITE_OTHER): Payer: Medicare Other | Admitting: *Deleted

## 2017-02-18 DIAGNOSIS — I4891 Unspecified atrial fibrillation: Secondary | ICD-10-CM

## 2017-02-18 DIAGNOSIS — I481 Persistent atrial fibrillation: Secondary | ICD-10-CM

## 2017-02-18 DIAGNOSIS — Z5181 Encounter for therapeutic drug level monitoring: Secondary | ICD-10-CM

## 2017-02-18 DIAGNOSIS — I4819 Other persistent atrial fibrillation: Secondary | ICD-10-CM

## 2017-02-18 LAB — POCT INR: INR: 2.7

## 2017-02-27 ENCOUNTER — Other Ambulatory Visit: Payer: Self-pay | Admitting: Internal Medicine

## 2017-02-27 NOTE — Telephone Encounter (Signed)
Rx(s) sent to pharmacy electronically.  

## 2017-03-18 ENCOUNTER — Ambulatory Visit (INDEPENDENT_AMBULATORY_CARE_PROVIDER_SITE_OTHER): Payer: Medicare Other

## 2017-03-18 DIAGNOSIS — Z5181 Encounter for therapeutic drug level monitoring: Secondary | ICD-10-CM

## 2017-03-18 DIAGNOSIS — I481 Persistent atrial fibrillation: Secondary | ICD-10-CM

## 2017-03-18 DIAGNOSIS — I4819 Other persistent atrial fibrillation: Secondary | ICD-10-CM

## 2017-03-18 DIAGNOSIS — I4891 Unspecified atrial fibrillation: Secondary | ICD-10-CM | POA: Diagnosis not present

## 2017-03-18 LAB — POCT INR: INR: 2.3

## 2017-03-31 DIAGNOSIS — Z961 Presence of intraocular lens: Secondary | ICD-10-CM | POA: Diagnosis not present

## 2017-04-11 ENCOUNTER — Other Ambulatory Visit: Payer: Self-pay | Admitting: *Deleted

## 2017-04-11 MED ORDER — METOPROLOL TARTRATE 50 MG PO TABS: 50.0000 mg | ORAL_TABLET | Freq: Two times a day (BID) | ORAL | 3 refills | Status: DC

## 2017-04-29 ENCOUNTER — Ambulatory Visit (INDEPENDENT_AMBULATORY_CARE_PROVIDER_SITE_OTHER): Payer: Medicare Other

## 2017-04-29 DIAGNOSIS — I4891 Unspecified atrial fibrillation: Secondary | ICD-10-CM | POA: Diagnosis not present

## 2017-04-29 DIAGNOSIS — I4819 Other persistent atrial fibrillation: Secondary | ICD-10-CM

## 2017-04-29 DIAGNOSIS — Z5181 Encounter for therapeutic drug level monitoring: Secondary | ICD-10-CM

## 2017-04-29 DIAGNOSIS — I481 Persistent atrial fibrillation: Secondary | ICD-10-CM | POA: Diagnosis not present

## 2017-04-29 LAB — POCT INR: INR: 2.2

## 2017-05-08 ENCOUNTER — Other Ambulatory Visit: Payer: Self-pay | Admitting: Family Medicine

## 2017-05-08 DIAGNOSIS — R413 Other amnesia: Secondary | ICD-10-CM

## 2017-05-30 ENCOUNTER — Other Ambulatory Visit: Payer: Self-pay | Admitting: Family Medicine

## 2017-06-10 ENCOUNTER — Ambulatory Visit (INDEPENDENT_AMBULATORY_CARE_PROVIDER_SITE_OTHER): Payer: Medicare Other | Admitting: Pharmacist

## 2017-06-10 DIAGNOSIS — I4891 Unspecified atrial fibrillation: Secondary | ICD-10-CM | POA: Diagnosis not present

## 2017-06-10 DIAGNOSIS — I481 Persistent atrial fibrillation: Secondary | ICD-10-CM

## 2017-06-10 DIAGNOSIS — Z5181 Encounter for therapeutic drug level monitoring: Secondary | ICD-10-CM

## 2017-06-10 DIAGNOSIS — I4819 Other persistent atrial fibrillation: Secondary | ICD-10-CM

## 2017-06-10 LAB — POCT INR: INR: 2.9

## 2017-06-13 ENCOUNTER — Other Ambulatory Visit: Payer: Self-pay | Admitting: Internal Medicine

## 2017-06-19 ENCOUNTER — Other Ambulatory Visit: Payer: Self-pay | Admitting: Internal Medicine

## 2017-06-19 ENCOUNTER — Other Ambulatory Visit: Payer: Self-pay | Admitting: Family Medicine

## 2017-06-19 DIAGNOSIS — Z1231 Encounter for screening mammogram for malignant neoplasm of breast: Secondary | ICD-10-CM

## 2017-07-09 ENCOUNTER — Other Ambulatory Visit: Payer: Self-pay | Admitting: Family Medicine

## 2017-07-10 NOTE — Telephone Encounter (Signed)
Ok to refill 

## 2017-07-10 NOTE — Telephone Encounter (Signed)
Ok, but due for ov. 

## 2017-07-22 ENCOUNTER — Ambulatory Visit (INDEPENDENT_AMBULATORY_CARE_PROVIDER_SITE_OTHER): Payer: Medicare Other

## 2017-07-22 DIAGNOSIS — I481 Persistent atrial fibrillation: Secondary | ICD-10-CM

## 2017-07-22 DIAGNOSIS — I4819 Other persistent atrial fibrillation: Secondary | ICD-10-CM

## 2017-07-22 DIAGNOSIS — I4891 Unspecified atrial fibrillation: Secondary | ICD-10-CM | POA: Diagnosis not present

## 2017-07-22 DIAGNOSIS — Z5181 Encounter for therapeutic drug level monitoring: Secondary | ICD-10-CM | POA: Diagnosis not present

## 2017-07-22 LAB — POCT INR: INR: 2.3

## 2017-08-01 ENCOUNTER — Ambulatory Visit
Admission: RE | Admit: 2017-08-01 | Discharge: 2017-08-01 | Disposition: A | Discharge status: Home / Self Care | Payer: Medicare Other | Source: Ambulatory Visit | Attending: Family Medicine | Admitting: Family Medicine

## 2017-08-01 DIAGNOSIS — Z1231 Encounter for screening mammogram for malignant neoplasm of breast: Secondary | ICD-10-CM | POA: Diagnosis not present

## 2017-08-19 ENCOUNTER — Ambulatory Visit (INDEPENDENT_AMBULATORY_CARE_PROVIDER_SITE_OTHER): Payer: Medicare Other | Admitting: Family Medicine

## 2017-08-19 VITALS — BP 118/70 | HR 64 | Temp 98.1°F | Resp 16 | Ht <= 58 in | Wt 168.0 lb

## 2017-08-19 DIAGNOSIS — M79675 Pain in left toe(s): Secondary | ICD-10-CM

## 2017-08-19 MED ORDER — DICLOFENAC SODIUM 1 % TD GEL: 4.0000 g | Freq: Four times a day (QID) | TRANSDERMAL | 3 refills | Status: DC

## 2017-08-19 NOTE — Progress Notes (Signed)
Subjective:    Patient ID: [name], female    DOB: [birth year], 83 y.o.   MRN: [id number]  HPI I have not seen the patient in quite some time. Patient is added on today at the request of her husband.  She has been complaining of pain in the tip of her left third toe now for several weeks. The pain is distal to the end of her toenail. The skin in that area is exquisitely tender to touch. However pain is out of proportion to exam. There is no physical abnormality seen in that area. There is no thick skin. There is no ulcer. There is no erythema. There is no skin breakdown. There is no palpable mass or abnormality. There is no obvious foreign body. The patient denies stepping on glass or going barefoot or she may have come in contact with foreign body. Visibly there is nothing wrong with the skin. They have tried over-the-counter corn remover for 2 weeks without any improvement Past Medical History:  Diagnosis Date  . A-fib ([place])    a. Dx 01/2015, CHA2DS2VASc = 5-->coumadin started.  Marland Kitchen CAD (coronary artery disease)    a. 01/2015 NSTEMI/PCI: LM nl, LAD 30p, LCX nondom, 30-40p, 50d, OM1 100p (2.75x16 Rebel BMS), RCA mild diff plaque, EF 55%.  . Candida infection, disseminated ([place])   . H/O echocardiogram    a. 01/2015 Echo: EF 50-55%, mild LVH, no rwma, mild MR, mildly dil LA, mod TR, PASP 53 mmHg.  Marland Kitchen Hyperlipidemia   . Hypertension   . Hypothyroid   . Osteoporosis   . Sleep apnea 04/2012   Mild/ AHI 10/CPAP 7cm h2o w/2 L o2   Past Surgical History:  Procedure Laterality Date  . CARDIOVERSION N/A 08/22/2015   Procedure: CARDIOVERSION;  Surgeon: Skeet Latch, MD;  Location: [place];  Service: Cardiovascular;  Laterality: N/A;  . LEFT HEART CATHETERIZATION WITH CORONARY ANGIOGRAM N/A 02/20/2015   Procedure: LEFT HEART CATHETERIZATION WITH CORONARY ANGIOGRAM;  Surgeon: Jolaine Artist, MD;  Location: [facility] CATH LAB;  Service: Cardiovascular;  Laterality: N/A;  . PERCUTANEOUS CORONARY  STENT INTERVENTION (PCI-S)  02/20/2015   Procedure: PERCUTANEOUS CORONARY STENT INTERVENTION (PCI-S);  Surgeon: Jolaine Artist, MD;  Location: [facility] CATH LAB;  Service: Cardiovascular;;  OM1  (2.75/82mm Rebel)   Current Outpatient Prescriptions on File Prior to Visit  Medication Sig Dispense Refill  . ALPRAZolam (XANAX) 0.25 MG tablet TAKE 1 TABLET BY MOUTH AT BEDTIME AS NEEDED FOR SLEEP 30 tablet 0  . amLODipine (NORVASC) 5 MG tablet TAKE 1 TABLET (5 MG TOTAL) BY MOUTH DAILY. KEEP OV. 90 tablet 2  . atorvastatin (LIPITOR) 80 MG tablet TAKE 1 TABLET BY MOUTH EVERY DAY AT 6PM 30 tablet 11  . azithromycin (ZITHROMAX) 250 MG tablet Take 2 tablets x 1 day, then 1 tab daily for 4 days 6 tablet 0  . calcium carbonate 1250 MG capsule Take 1,250 mg by mouth 2 (two) times daily with a meal.    . cholecalciferol (VITAMIN D) 1000 UNITS tablet Take 1,000 Units by mouth daily.    . clopidogrel (PLAVIX) 75 MG tablet TAKE 1 TABLET BY MOUTH EVERY DAY 30 tablet 11  . clotrimazole-betamethasone (LOTRISONE) cream Apply 1 application topically 2 (two) times daily. 30 g 2  . cyanocobalamin 1000 MCG tablet Take 100 mcg by mouth daily.    Marland Kitchen donepezil (ARICEPT) 5 MG tablet TAKE 1 TABLET (5 MG TOTAL) BY MOUTH AT BEDTIME. 90 tablet 3  . guaiFENesin-codeine 100-10 MG/5ML  syrup Take 5 mLs by mouth every 6 (six) hours as needed. 120 mL 0  . levothyroxine (SYNTHROID, LEVOTHROID) 137 MCG tablet TAKE 1 TABLET EVERY DAY 90 tablet 3  . lisinopril (PRINIVIL,ZESTRIL) 5 MG tablet TAKE 1 TABLET BY MOUTH EVERY DAY 30 tablet 11  . meclizine (ANTIVERT) 25 MG tablet TAKE 1 TABLET (25 MG TOTAL) BY MOUTH 3 (THREE) TIMES DAILY AS NEEDED FOR DIZZINESS. 30 tablet 0  . metoprolol tartrate (LOPRESSOR) 50 MG tablet TAKE 1 TABLET BY MOUTH TWICE A DAY 60 tablet 0  . NITROSTAT 0.4 MG SL tablet PLACE 1 TAB UNDER THE TONGUE EVERY 5 MIN X 3 DOSES AS NEEDED FOR CHEST PAIN 25 tablet 1  . pantoprazole (PROTONIX) 40 MG tablet TAKE 1 TABLET BY MOUTH EVERY  DAY 30 tablet 9  . raloxifene (EVISTA) 60 MG tablet TAKE 1 TABLET (60 MG TOTAL) BY MOUTH DAILY. 90 tablet 3  . traMADol (ULTRAM) 50 MG tablet TAKE 1 TABLET BY MOUTH EVERY 6 HOURS AS NEEDED FOR PAIN (MUST LAST 30 DAYS) 60 tablet 2  . warfarin (COUMADIN) 1 MG tablet TAKE 2-3 TABLETS BY MOUTH DAILY AS DIRECTED BY COUMADIN CLINIC 240 tablet 1   No current facility-administered medications on file prior to visit.    Allergies  Allergen Reactions  . Fosamax [Alendronate Sodium]    Social History   Social History  . Marital status: Married    Spouse name: N/A  . Number of children: N/A  . Years of education: N/A   Occupational History  . Not on file.   Social History Main Topics  . Smoking status: Former Smoker    Quit date: 04/28/1995  . Smokeless tobacco: Never Used  . Alcohol use No  . Drug use: No  . Sexual activity: Not on file   Other Topics Concern  . Not on file   Social History Narrative  . No narrative on file      Review of Systems  All other systems reviewed and are negative.      Objective:   Physical Exam  Constitutional: She appears well-developed and well-nourished.  Eyes: Conjunctivae are normal.  Cardiovascular: Normal rate and normal heart sounds.   No murmur heard. Pulmonary/Chest: Effort normal and breath sounds normal.  Abdominal: Soft.  Vitals reviewed.         Assessment & Plan:  Pain of toe of left foot  I can see no abnormality in the area where the patient is experiencing pain. The toe itself pills healthy, neurovascularly intact with no skin breakdown, no corn inflammation, no evidence of cellulitis or infection.  I question if this could possibly be neuropathic pain. I will try the patient on voltaren gel 1% applied 4 times a day to the affected area for 1 week and see if her pain improves. My rationale behind this is to use an anti-inflammatory in case there is nerve irritation at the tip of the toe causing her pain. She cannot take  oral anti-inflammatories because of her cardiovascular history. I also asked the patient come back in 6 weeks when her husband has his Coumadin checked again for a checkup. She is long overdue for fasting lab work and a more thorough medical exam. Today's visit was very problem focused.

## 2017-08-20 ENCOUNTER — Telehealth: Payer: Self-pay | Admitting: Family Medicine

## 2017-08-20 NOTE — Telephone Encounter (Signed)
PA submitted through [email or web address] and received the following - Next Steps  The plan will fax you a determination, typically within 1 to 5 business days.

## 2017-08-22 MED ORDER — DICLOFENAC SODIUM 1 % TD GEL: 4.0000 g | Freq: Four times a day (QID) | TRANSDERMAL | 3 refills | Status: DC

## 2017-08-22 NOTE — Telephone Encounter (Signed)
PA was denied by insurance - other suggestions?

## 2017-08-25 NOTE — Telephone Encounter (Signed)
Could try aspercreme OTC, cannot take any oral nsaid because she is on coumadin.

## 2017-08-26 ENCOUNTER — Other Ambulatory Visit: Payer: Self-pay | Admitting: Internal Medicine

## 2017-08-27 NOTE — Telephone Encounter (Signed)
Spoke to pt's husband and with goodrx can get for 25$. He states that it will be fine at that price and [facility] was [facility] and confirmed that [facility] can use the coupon. Coupon printed and left up front for pt to p/u

## 2017-08-27 NOTE — Telephone Encounter (Signed)
LMTRC

## 2017-09-02 ENCOUNTER — Other Ambulatory Visit: Payer: Self-pay | Admitting: Family Medicine

## 2017-09-02 ENCOUNTER — Ambulatory Visit (INDEPENDENT_AMBULATORY_CARE_PROVIDER_SITE_OTHER): Payer: Medicare Other | Admitting: *Deleted

## 2017-09-02 DIAGNOSIS — I481 Persistent atrial fibrillation: Secondary | ICD-10-CM | POA: Diagnosis not present

## 2017-09-02 DIAGNOSIS — Z5181 Encounter for therapeutic drug level monitoring: Secondary | ICD-10-CM | POA: Diagnosis not present

## 2017-09-02 DIAGNOSIS — I4819 Other persistent atrial fibrillation: Secondary | ICD-10-CM

## 2017-09-02 LAB — POCT INR: INR: 2.4

## 2017-09-02 NOTE — Telephone Encounter (Signed)
ok 

## 2017-09-02 NOTE — Telephone Encounter (Signed)
Ok to refill 

## 2017-09-02 NOTE — Patient Instructions (Signed)
Continue on same amount of SlimFast 4 days a week on Tuesdays, Thursdays, Saturdays and Sundays.

## 2017-09-08 ENCOUNTER — Other Ambulatory Visit: Payer: Self-pay | Admitting: Internal Medicine

## 2017-09-16 ENCOUNTER — Other Ambulatory Visit: Payer: Self-pay | Admitting: Internal Medicine

## 2017-09-16 NOTE — Telephone Encounter (Signed)
Rx(s) sent to pharmacy electronically.  

## 2017-09-19 ENCOUNTER — Other Ambulatory Visit: Payer: Self-pay | Admitting: Internal Medicine

## 2017-09-30 ENCOUNTER — Ambulatory Visit (INDEPENDENT_AMBULATORY_CARE_PROVIDER_SITE_OTHER): Payer: Medicare Other | Admitting: Family Medicine

## 2017-09-30 ENCOUNTER — Encounter: Payer: Self-pay | Admitting: Family Medicine

## 2017-09-30 VITALS — BP 130/78 | HR 75 | Temp 97.5°F | Resp 14 | Ht <= 58 in | Wt 167.0 lb

## 2017-09-30 DIAGNOSIS — E7800 Pure hypercholesterolemia, unspecified: Secondary | ICD-10-CM | POA: Diagnosis not present

## 2017-09-30 DIAGNOSIS — Z23 Encounter for immunization: Secondary | ICD-10-CM | POA: Diagnosis not present

## 2017-09-30 DIAGNOSIS — I481 Persistent atrial fibrillation: Secondary | ICD-10-CM | POA: Diagnosis not present

## 2017-09-30 DIAGNOSIS — I10 Essential (primary) hypertension: Secondary | ICD-10-CM

## 2017-09-30 DIAGNOSIS — I4819 Other persistent atrial fibrillation: Secondary | ICD-10-CM

## 2017-09-30 DIAGNOSIS — E038 Other specified hypothyroidism: Secondary | ICD-10-CM | POA: Diagnosis not present

## 2017-09-30 DIAGNOSIS — M810 Age-related osteoporosis without current pathological fracture: Secondary | ICD-10-CM

## 2017-09-30 DIAGNOSIS — I2510 Atherosclerotic heart disease of native coronary artery without angina pectoris: Secondary | ICD-10-CM | POA: Diagnosis not present

## 2017-09-30 LAB — COMPLETE METABOLIC PANEL WITH GFR
AG Ratio: 1.4 (calc) (ref 1.0–2.5)
ALKALINE PHOSPHATASE (APISO): 55 U/L (ref 33–130)
ALT: 12 U/L (ref 6–29)
AST: 20 U/L (ref 10–35)
Albumin: 3.6 g/dL (ref 3.6–5.1)
BILIRUBIN TOTAL: 1.2 mg/dL (ref 0.2–1.2)
BUN: 17 mg/dL (ref 7–25)
CALCIUM: 9 mg/dL (ref 8.6–10.4)
CO2: 25 mmol/L (ref 20–32)
CREATININE: 0.88 mg/dL (ref 0.60–0.88)
Chloride: 107 mmol/L (ref 98–110)
GFR, Est African American: 71 mL/min/{1.73_m2} (ref >=60)
GFR, Est Non African American: 61 mL/min/{1.73_m2} (ref >=60)
GLOBULIN: 2.6 g/dL (ref 1.9–3.7)
Glucose, Bld: 82 mg/dL (ref 65–99)
Potassium: 4.1 mmol/L (ref 3.5–5.3)
SODIUM: 142 mmol/L (ref 135–146)
TOTAL PROTEIN: 6.2 g/dL (ref 6.1–8.1)

## 2017-09-30 LAB — CBC WITH DIFFERENTIAL/PLATELET
BASOS PCT: 0.3 %
Basophils Absolute: 20 cells/uL (ref 0–200)
EOS PCT: 3 %
Eosinophils Absolute: 204 cells/uL (ref 15–500)
HCT: 44.1 % (ref 35.0–45.0)
Hemoglobin: 14.6 g/dL (ref 11.7–15.5)
LYMPHS ABS: 1584 {cells}/uL (ref 850–3900)
MCH: 29.1 pg (ref 27.0–33.0)
MCHC: 33.1 g/dL (ref 32.0–36.0)
MCV: 88 fL (ref 80.0–100.0)
MPV: 9.8 fL (ref 7.5–12.5)
Monocytes Relative: 6.1 %
Neutro Abs: 4576 cells/uL (ref 1500–7800)
Neutrophils Relative %: 67.3 %
PLATELETS: 206 10*3/uL (ref 140–400)
RBC: 5.01 10*6/uL (ref 3.80–5.10)
RDW: 13.3 % (ref 11.0–15.0)
TOTAL LYMPHOCYTE: 23.3 %
WBC: 6.8 10*3/uL (ref 3.8–10.8)
WBCMIX: 415 {cells}/uL (ref 200–950)

## 2017-09-30 LAB — LIPID PANEL
CHOL/HDL RATIO: 2.8 (calc) (ref <5.0)
CHOLESTEROL: 116 mg/dL (ref <200)
HDL: 41 mg/dL — ABNORMAL LOW (ref >50)
LDL Cholesterol (Calc): 53 mg/dL (calc)
NON-HDL CHOLESTEROL (CALC): 75 mg/dL (ref <130)
Triglycerides: 134 mg/dL (ref <150)

## 2017-09-30 LAB — TSH: TSH: 1.4 mIU/L (ref 0.40–4.50)

## 2017-09-30 NOTE — Addendum Note (Signed)
Addended by: Vonna Kotyk A on: 09/30/2017 09:54 AM   Modules accepted: Orders

## 2017-09-30 NOTE — Progress Notes (Signed)
Subjective:    Patient ID: [name], female    DOB: [birth year], 83 y.o.   MRN: [id number]  HPI Patient is a very pleasant 83 year old white female who is here today at my request for a checkup. Past medical history is significant for coronary artery disease status post a non-ST elevation myocardial infarction in 2016. She underwent stenting at that time. She also has a history of hyperlipidemia, hypertension, hypothyroidism, and osteoporosis. She is currently on Coumadin for persistent atrial fibrillation in addition to Plavix due to her history of ascvd with a bare metal stent (but aspirin has been discontinued).  Her INR is monitored by her cardiologist. She is overdue for a flu shot. She is due for a TSH. She is due for fasting lipid panel. Last bone density test was in 2016 which revealed osteoporosis of the left forearm.  However I do not see any medication for osteoporosis on her medicine list. She is taking calcium and vitamin D. She has an history of an allergy to Fosamax. She is not currently on prolia.  Unfortunately, she seems slightly more confused today. I believe her age-related memory problems have progressed slightly. She is alert. She is oriented to place and time but she has a difficult time answering my questions regarding her medications and how she's feeling. Her husband denies any delusions or hallucinations for the patient  Past Medical History:  Diagnosis Date  . A-fib ([place])    a. Dx 01/2015, CHA2DS2VASc = 5-->coumadin started.  Marland Kitchen CAD (coronary artery disease)    a. 01/2015 NSTEMI/PCI: LM nl, LAD 30p, LCX nondom, 30-40p, 50d, OM1 100p (2.75x16 Rebel BMS), RCA mild diff plaque, EF 55%.  . Candida infection, disseminated ([place])   . H/O echocardiogram    a. 01/2015 Echo: EF 50-55%, mild LVH, no rwma, mild MR, mildly dil LA, mod TR, PASP 53 mmHg.  Marland Kitchen Hyperlipidemia   . Hypertension   . Hypothyroid   . Osteoporosis   . Sleep apnea 04/2012   Mild/ AHI 10/CPAP 7cm h2o w/2 L  o2   Past Surgical History:  Procedure Laterality Date  . CARDIOVERSION N/A 08/22/2015   Procedure: CARDIOVERSION;  Surgeon: Skeet Latch, MD;  Location: [place];  Service: Cardiovascular;  Laterality: N/A;  . LEFT HEART CATHETERIZATION WITH CORONARY ANGIOGRAM N/A 02/20/2015   Procedure: LEFT HEART CATHETERIZATION WITH CORONARY ANGIOGRAM;  Surgeon: Jolaine Artist, MD;  Location: [facility] CATH LAB;  Service: Cardiovascular;  Laterality: N/A;  . PERCUTANEOUS CORONARY STENT INTERVENTION (PCI-S)  02/20/2015   Procedure: PERCUTANEOUS CORONARY STENT INTERVENTION (PCI-S);  Surgeon: Jolaine Artist, MD;  Location: [facility] CATH LAB;  Service: Cardiovascular;;  OM1  (2.75/38mm Rebel)   Current Outpatient Prescriptions on File Prior to Visit  Medication Sig Dispense Refill  . ALPRAZolam (XANAX) 0.25 MG tablet TAKE 1 TABLET BY MOUTH AT BEDTIME AS NEEDED FOR SLEEP 30 tablet 2  . amLODipine (NORVASC) 5 MG tablet TAKE 1 TABLET (5 MG TOTAL) BY MOUTH DAILY. KEEP OV. 90 tablet 2  . atorvastatin (LIPITOR) 80 MG tablet TAKE 1 TABLET (80 MG TOTAL) BY MOUTH DAILY AT 6 PM. 30 tablet 0  . calcium carbonate 1250 MG capsule Take 1,250 mg by mouth 2 (two) times daily with a meal.    . cholecalciferol (VITAMIN D) 1000 UNITS tablet Take 1,000 Units by mouth daily.    . clopidogrel (PLAVIX) 75 MG tablet TAKE 1 TABLET BY MOUTH EVERY DAY 30 tablet 0  . clotrimazole-betamethasone (LOTRISONE) cream Apply 1 application  topically 2 (two) times daily. 30 g 2  . cyanocobalamin 1000 MCG tablet Take 100 mcg by mouth daily.    . diclofenac sodium (VOLTAREN) 1 % GEL Apply 4 g topically 4 (four) times daily. Apply to affected toe 100 g 3  . donepezil (ARICEPT) 5 MG tablet TAKE 1 TABLET (5 MG TOTAL) BY MOUTH AT BEDTIME. 90 tablet 3  . guaiFENesin-codeine 100-10 MG/5ML syrup Take 5 mLs by mouth every 6 (six) hours as needed. 120 mL 0  . levothyroxine (SYNTHROID, LEVOTHROID) 137 MCG tablet TAKE 1 TABLET EVERY DAY 90 tablet 3  .  lisinopril (PRINIVIL,ZESTRIL) 5 MG tablet TAKE 1 TABLET BY MOUTH EVERY DAY 30 tablet 1  . meclizine (ANTIVERT) 25 MG tablet TAKE 1 TABLET (25 MG TOTAL) BY MOUTH 3 (THREE) TIMES DAILY AS NEEDED FOR DIZZINESS. 30 tablet 0  . metoprolol tartrate (LOPRESSOR) 50 MG tablet TAKE 1 TABLET BY MOUTH TWICE A DAY 60 tablet 2  . NITROSTAT 0.4 MG SL tablet PLACE 1 TAB UNDER THE TONGUE EVERY 5 MIN X 3 DOSES AS NEEDED FOR CHEST PAIN 25 tablet 1  . pantoprazole (PROTONIX) 40 MG tablet TAKE 1 TABLET BY MOUTH EVERY DAY 30 tablet 9  . raloxifene (EVISTA) 60 MG tablet TAKE 1 TABLET (60 MG TOTAL) BY MOUTH DAILY. 90 tablet 3  . traMADol (ULTRAM) 50 MG tablet TAKE 1 TABLET BY MOUTH EVERY 6 HOURS AS NEEDED FOR PAIN (MUST LAST 30 DAYS) 60 tablet 2  . warfarin (COUMADIN) 1 MG tablet TAKE 2-3 TABLETS BY MOUTH DAILY AS DIRECTED BY COUMADIN CLINIC 240 tablet 1   No current facility-administered medications on file prior to visit.     Allergies  Allergen Reactions  . Fosamax [Alendronate Sodium]    Social History   Social History  . Marital status: Married    Spouse name: N/A  . Number of children: N/A  . Years of education: N/A   Occupational History  . Not on file.   Social History Main Topics  . Smoking status: Former Smoker    Quit date: 04/28/1995  . Smokeless tobacco: Never Used  . Alcohol use No  . Drug use: No  . Sexual activity: Not on file   Other Topics Concern  . Not on file   Social History Narrative  . No narrative on file     Review of Systems  All other systems reviewed and are negative.      Objective:   Physical Exam  Constitutional: She appears well-developed and well-nourished. No distress.  HENT:  Right Ear: External ear normal.  Left Ear: External ear normal.  Nose: Nose normal.  Mouth/Throat: Oropharynx is clear and moist. No oropharyngeal exudate.  Eyes: Conjunctivae are normal.  Neck: Neck supple. No JVD present. No thyromegaly present.  Cardiovascular: Normal rate.   An irregularly irregular rhythm present.  Murmur heard. Pulmonary/Chest: Effort normal and breath sounds normal. No respiratory distress. She has no wheezes. She has no rales.  Abdominal: Soft. Bowel sounds are normal. She exhibits no distension. There is no tenderness. There is no rebound and no guarding.  Musculoskeletal: She exhibits no edema.  Lymphadenopathy:    She has no cervical adenopathy.  Skin: She is not diaphoretic.  Vitals reviewed.         Assessment & Plan:  ASCVD (arteriosclerotic cardiovascular disease) - Plan: CBC with Differential/Platelet, COMPLETE METABOLIC PANEL WITH GFR, Lipid panel  Other specified hypothyroidism - Plan: CBC with Differential/Platelet, TSH  Essential hypertension - Plan:  CBC with Differential/Platelet  Persistent atrial fibrillation (HCC) - Plan: CBC with Differential/Platelet  Pure hypercholesterolemia - Plan: COMPLETE METABOLIC PANEL WITH GFR, Lipid panel  Patient received her flu shot today. Her blood pressures well controlled at 130/78. She is currently on Coumadin anticoagulation for atrial fibrillation and this is monitored by her cardiologist.  She is currently on Plavix as an antiplatelet agent given her history of a bare-metal stent. I will recheck a CBC to monitor for anemia or thrombocytopenia given her chronic anticoagulation. I'll check a fasting lipid panel. Her goal LDL cholesterol be less than 70 given her history of ASCVD. I will also check a TSH to ensure adequate dosage of her levothyroxine. I am concerned about her osteoporosis being untreated. Therefore I will schedule the patient for prolia injections every 6 months. Continue calcium and vitamin D.

## 2017-10-08 ENCOUNTER — Encounter: Payer: Self-pay | Admitting: Internal Medicine

## 2017-10-08 ENCOUNTER — Ambulatory Visit (INDEPENDENT_AMBULATORY_CARE_PROVIDER_SITE_OTHER): Payer: Medicare Other | Admitting: Internal Medicine

## 2017-10-08 VITALS — BP 110/74 | HR 76 | Ht <= 58 in | Wt 168.4 lb

## 2017-10-08 DIAGNOSIS — I350 Nonrheumatic aortic (valve) stenosis: Secondary | ICD-10-CM | POA: Diagnosis not present

## 2017-10-08 DIAGNOSIS — I10 Essential (primary) hypertension: Secondary | ICD-10-CM

## 2017-10-08 DIAGNOSIS — I481 Persistent atrial fibrillation: Secondary | ICD-10-CM

## 2017-10-08 DIAGNOSIS — I4819 Other persistent atrial fibrillation: Secondary | ICD-10-CM

## 2017-10-08 NOTE — Progress Notes (Signed)
OFFICE NOTE  Chief Complaint:  Follow-up a-fib, aortic stenosis  Primary Care Physician: [name], MD  HPI:  [name] is an 83 yo female with a prior history of hypertension, hyperlipidemia, hypothyroidism, sleep apnea, and osteoporosis in the setting of chronic steroid therapy. She was recently admitted to [facility] with severe chest and back discomfort. EKG showed rate-controlled atrial fibrillation which was a new diagnosis. She ruled in for MI. Echo showed normal LV function. Cath revealed an occluded obtuse marginal branch of the left circumflex with other wise nonobstructive disease and normal LV function. The obtuse marginal was successfully stented using a 2.75 x 16 mm Rebel BMS. She did well postprocedure. With new onset A. fib, she was placed on Coumadin with a plan to continue aspirin, Plavix, and warfarin for 1 month and then to discontinue aspirin at that time.  Since discharge, she has done well. She has not had any chest pain or dyspnea. She says that she has been taking it fairly easy because she was.  She denies chest pain, palpitations, dyspnea, pnd, orthopnea, n, v, dizziness, syncope, edema, weight gain, or early satiety.  Right groin catheterization site has been healing well. She was noted to have significant tibial infection with sores under both breasts. She was given Diflucan in the hospital but no further treatment was initiated. She also had new onset atrial fibrillation and has been on warfarin. There've been no attempts at cardioversion.  [name] returns today for follow-up. She is in persistent A. fib. She's currently on Plavix and warfarin. I reviewed her INRs which are been followed at [facility] and they indicate that she's been subtherapeutic several times. We cannot continue with cardioversion at this time. I've asked Erasmo Downer, our anticoagulation pharmacist at [facility] to possibly follow her for the next month to try to get her INR  therapeutic. She continues to feel somewhat fatigued and may benefit from cardioversion.   I head pleasure seeing [name] back in the office today. She recently underwent elective cardioversion by my partner Dr. Oval Linsey. Unfortunately this was unsuccessful and she remains in atrial fibrillation. She says however she does feel stronger and she is asymptomatic at this time. She's had resolution of her candidal infection under her breasts with a treatment of a topical anti-fungal. INR is mildly supratherapeutic.  10/03/2016  [name] returns today for follow-up. Recently she had an upper respiratory infection and blood pressure was noted to be elevated. This is mostly resolved however blood pressure still is elevated and heart rate is in the upper 80s. I cut back her beta blocker in the past however it looks like she may need to be on the full dose now. In addition she has allowed her aortic stenosis murmur today. Her last echo was in February 2016. She reports some fatigue however is basically inactive at home. I have encouraged exercise.  10/08/2017  [name] was seen today in follow-up. She recent saw primary care provider and he felt that she was doing pretty well. She's had some memory loss. Overall though she denied any chest pain or worsening shortness of breath. She has long-standing persistent not permanent A. fib. Her INRs have been stable. She has a history of sleep apnea on CPAP. Blood pressure is well controlled. Recently a lipid profile showed LDL-C at goal of 45. She also has a history of aortic stenosis. Her last echo in 2017 showed progression of her aortic valve disease to moderate  aortic stenosis. She reports no symptoms associated with this. She uses a walker but denies any recent falls at home.  PMHx:  Past Medical History:  Diagnosis Date  . A-fib ([place])    a. Dx 01/2015, CHA2DS2VASc = 5-->coumadin started.  Marland Kitchen CAD (coronary artery disease)    a. 01/2015 NSTEMI/PCI: LM  nl, LAD 30p, LCX nondom, 30-40p, 50d, OM1 100p (2.75x16 Rebel BMS), RCA mild diff plaque, EF 55%.  . Candida infection, disseminated ([place])   . H/O echocardiogram    a. 01/2015 Echo: EF 50-55%, mild LVH, no rwma, mild MR, mildly dil LA, mod TR, PASP 53 mmHg.  Marland Kitchen Hyperlipidemia   . Hypertension   . Hypothyroid   . Osteoporosis   . Sleep apnea 04/2012   Mild/ AHI 10/CPAP 7cm h2o w/2 L o2    Past Surgical History:  Procedure Laterality Date  . CARDIOVERSION N/A 08/22/2015   Procedure: CARDIOVERSION;  Surgeon: Skeet Latch, MD;  Location: [place];  Service: Cardiovascular;  Laterality: N/A;  . LEFT HEART CATHETERIZATION WITH CORONARY ANGIOGRAM N/A 02/20/2015   Procedure: LEFT HEART CATHETERIZATION WITH CORONARY ANGIOGRAM;  Surgeon: Jolaine Artist, MD;  Location: [facility] CATH LAB;  Service: Cardiovascular;  Laterality: N/A;  . PERCUTANEOUS CORONARY STENT INTERVENTION (PCI-S)  02/20/2015   Procedure: PERCUTANEOUS CORONARY STENT INTERVENTION (PCI-S);  Surgeon: Jolaine Artist, MD;  Location: [facility] CATH LAB;  Service: Cardiovascular;;  OM1  (2.75/95mm Rebel)    FAMHx:  Family History  Problem Relation Age of Onset  . Coronary artery disease Father   . Diabetes Father   . Stroke Mother   . Arthritis Mother     SOCHx:   reports that she quit smoking about 22 years ago. She has never used smokeless tobacco. She reports that she does not drink alcohol or use drugs.  ALLERGIES:  Allergies  Allergen Reactions  . Fosamax [Alendronate Sodium]     ROS: Pertinent items noted in HPI and remainder of comprehensive ROS otherwise negative.  HOME MEDS: Current Outpatient Prescriptions  Medication Sig Dispense Refill  . ALPRAZolam (XANAX) 0.25 MG tablet TAKE 1 TABLET BY MOUTH AT BEDTIME AS NEEDED FOR SLEEP 30 tablet 2  . amLODipine (NORVASC) 5 MG tablet TAKE 1 TABLET (5 MG TOTAL) BY MOUTH DAILY. KEEP OV. 90 tablet 2  . atorvastatin (LIPITOR) 80 MG tablet TAKE 1 TABLET (80 MG TOTAL) BY MOUTH  DAILY AT 6 PM. 30 tablet 0  . calcium carbonate 1250 MG capsule Take 1,250 mg by mouth 2 (two) times daily with a meal.    . cholecalciferol (VITAMIN D) 1000 UNITS tablet Take 1,000 Units by mouth daily.    . clopidogrel (PLAVIX) 75 MG tablet TAKE 1 TABLET BY MOUTH EVERY DAY 30 tablet 0  . clotrimazole-betamethasone (LOTRISONE) cream Apply 1 application topically 2 (two) times daily. 30 g 2  . cyanocobalamin 1000 MCG tablet Take 100 mcg by mouth daily.    . diclofenac sodium (VOLTAREN) 1 % GEL Apply 4 g topically 4 (four) times daily. Apply to affected toe 100 g 3  . donepezil (ARICEPT) 5 MG tablet TAKE 1 TABLET (5 MG TOTAL) BY MOUTH AT BEDTIME. 90 tablet 3  . guaiFENesin-codeine 100-10 MG/5ML syrup Take 5 mLs by mouth every 6 (six) hours as needed. 120 mL 0  . levothyroxine (SYNTHROID, LEVOTHROID) 137 MCG tablet TAKE 1 TABLET EVERY DAY 90 tablet 3  . lisinopril (PRINIVIL,ZESTRIL) 5 MG tablet TAKE 1 TABLET BY MOUTH EVERY DAY 30 tablet 1  . meclizine (  ANTIVERT) 25 MG tablet TAKE 1 TABLET (25 MG TOTAL) BY MOUTH 3 (THREE) TIMES DAILY AS NEEDED FOR DIZZINESS. 30 tablet 0  . metoprolol tartrate (LOPRESSOR) 50 MG tablet TAKE 1 TABLET BY MOUTH TWICE A DAY 60 tablet 2  . NITROSTAT 0.4 MG SL tablet PLACE 1 TAB UNDER THE TONGUE EVERY 5 MIN X 3 DOSES AS NEEDED FOR CHEST PAIN 25 tablet 1  . pantoprazole (PROTONIX) 40 MG tablet TAKE 1 TABLET BY MOUTH EVERY DAY 30 tablet 9  . raloxifene (EVISTA) 60 MG tablet TAKE 1 TABLET (60 MG TOTAL) BY MOUTH DAILY. 90 tablet 3  . traMADol (ULTRAM) 50 MG tablet TAKE 1 TABLET BY MOUTH EVERY 6 HOURS AS NEEDED FOR PAIN (MUST LAST 30 DAYS) 60 tablet 2  . warfarin (COUMADIN) 1 MG tablet TAKE 2-3 TABLETS BY MOUTH DAILY AS DIRECTED BY COUMADIN CLINIC 240 tablet 1   No current facility-administered medications for this visit.     LABS/IMAGING: No results found for this or any previous visit (from the past 48 hour(s)). No results found.  WEIGHTS: Wt Readings from Last 3  Encounters:  10/08/17 168 lb 6.4 oz (76.4 kg)  09/30/17 167 lb (75.8 kg)  08/19/17 168 lb (76.2 kg)    VITALS: BP 110/74   Pulse 76   Ht 4\' 9"  (1.448 m)   Wt 168 lb 6.4 oz (76.4 kg)   BMI 36.44 kg/m   EXAM: General appearance: alert and no distress Neck: no carotid bruit, no JVD and thyroid not enlarged, symmetric, no tenderness/mass/nodules Lungs: clear to auscultation bilaterally Heart: regular rate and rhythm, S1, S2 normal and systolic murmur: late systolic 3/6, crescendo at 2nd right intercostal space Abdomen: soft, non-tender; bowel sounds normal; no masses,  no organomegaly Extremities: extremities normal, atraumatic, no cyanosis or edema Pulses: 2+ and symmetric Skin: Skin color, texture, turgor normal. No rashes or lesions Neurologic: Grossly normal Psych: Pleasant  EKG: Atrial fibrillation at 76, inferior and lateral Q waves I personally reviewed  ASSESSMENT: 1. NSTEMI-status post bare metal stent to obtuse marginal 2. Persistent atrial fibrillation on warfarin -  Failed cardioversion, ?symptomatic - CHADVASC 5 3. Dyslipidemia 4. Hypertension 5. Aortic stenosis 6. Memory loss, question early dementia  PLAN: 1.   [name] continues to do well. She is asymptomatic. Unfortunate she's got some mild memory loss. Her husband helps her with her pills. She is to be compliant and blood pressure is well controlled. Her cholesterol is at goal. She's had no further chest pain. She does have at least moderate aortic stenosis. She has a late peaking murmur but a clear S2. I suspect it may be slightly worse than it was last year will repeat an echo. I talked about options today including possible TAVR, I doubt she is a candidate for traditional surgical aVR procedure.  Follow-up with me in annually or sooner as necessary.  Pixie Casino, MD, [facility] Attending Cardiologist [place] [facility] [place] 10/08/2017, 9:38 AM

## 2017-10-08 NOTE — Patient Instructions (Addendum)
Your physician has requested that you have an echocardiogram at [address]. Echocardiography is a painless test that uses sound waves to create images of your heart. It provides your doctor with information about the size and shape of your heart and how well your heart's chambers and valves are working. This procedure takes approximately one hour. There are no restrictions for this procedure.  Your physician wants you to follow-up in: ONE YEAR with Dr. Debara Pickett. You will receive a reminder letter in the mail two months in advance. If you don't receive a letter, please call our office to schedule the follow-up appointment.

## 2017-10-14 ENCOUNTER — Ambulatory Visit (INDEPENDENT_AMBULATORY_CARE_PROVIDER_SITE_OTHER): Payer: Medicare Other | Admitting: *Deleted

## 2017-10-14 DIAGNOSIS — Z5181 Encounter for therapeutic drug level monitoring: Secondary | ICD-10-CM

## 2017-10-14 DIAGNOSIS — I4819 Other persistent atrial fibrillation: Secondary | ICD-10-CM

## 2017-10-14 DIAGNOSIS — I481 Persistent atrial fibrillation: Secondary | ICD-10-CM

## 2017-10-14 LAB — POCT INR: INR: 3.7

## 2017-10-21 ENCOUNTER — Other Ambulatory Visit: Payer: Self-pay

## 2017-10-21 ENCOUNTER — Telehealth: Payer: Self-pay | Admitting: Internal Medicine

## 2017-10-21 ENCOUNTER — Ambulatory Visit (HOSPITAL_COMMUNITY): Payer: Medicare Other | Attending: Cardiology

## 2017-10-21 DIAGNOSIS — I481 Persistent atrial fibrillation: Secondary | ICD-10-CM | POA: Insufficient documentation

## 2017-10-21 DIAGNOSIS — I350 Nonrheumatic aortic (valve) stenosis: Secondary | ICD-10-CM | POA: Diagnosis not present

## 2017-10-21 DIAGNOSIS — I10 Essential (primary) hypertension: Secondary | ICD-10-CM | POA: Insufficient documentation

## 2017-10-21 DIAGNOSIS — I4819 Other persistent atrial fibrillation: Secondary | ICD-10-CM

## 2017-10-21 DIAGNOSIS — E785 Hyperlipidemia, unspecified: Secondary | ICD-10-CM | POA: Diagnosis not present

## 2017-10-21 DIAGNOSIS — I252 Old myocardial infarction: Secondary | ICD-10-CM | POA: Diagnosis not present

## 2017-10-21 DIAGNOSIS — I083 Combined rheumatic disorders of mitral, aortic and tricuspid valves: Secondary | ICD-10-CM | POA: Diagnosis not present

## 2017-10-21 NOTE — Telephone Encounter (Signed)
Manufacturer states may be given any time of day and without regard to meals

## 2017-10-21 NOTE — Telephone Encounter (Signed)
Returned call to patient's husband.He stated wife has been taking Raloxifene every morning since her heart attack.Stated when he picked up from pharmacy directions say to take at bedtime.Advised I will send message to our pharmacist for advice.

## 2017-10-21 NOTE — Telephone Encounter (Signed)
Please call,he wants to know how the pt is supposed to be taking her Raloxifene?

## 2017-10-21 NOTE — Telephone Encounter (Signed)
Returned call to patient's husband Kristen's advice given.

## 2017-10-25 ENCOUNTER — Other Ambulatory Visit: Payer: Self-pay | Admitting: Internal Medicine

## 2017-10-28 ENCOUNTER — Ambulatory Visit (INDEPENDENT_AMBULATORY_CARE_PROVIDER_SITE_OTHER): Payer: Medicare Other | Admitting: *Deleted

## 2017-10-28 DIAGNOSIS — I481 Persistent atrial fibrillation: Secondary | ICD-10-CM

## 2017-10-28 DIAGNOSIS — Z5181 Encounter for therapeutic drug level monitoring: Secondary | ICD-10-CM | POA: Diagnosis not present

## 2017-10-28 DIAGNOSIS — I4819 Other persistent atrial fibrillation: Secondary | ICD-10-CM

## 2017-10-28 LAB — POCT INR: INR: 2.6

## 2017-10-31 ENCOUNTER — Other Ambulatory Visit: Payer: Self-pay | Admitting: Family Medicine

## 2017-11-01 ENCOUNTER — Other Ambulatory Visit: Payer: Self-pay | Admitting: Internal Medicine

## 2017-11-16 ENCOUNTER — Other Ambulatory Visit: Payer: Self-pay | Admitting: Internal Medicine

## 2017-11-18 ENCOUNTER — Ambulatory Visit (INDEPENDENT_AMBULATORY_CARE_PROVIDER_SITE_OTHER): Payer: Medicare Other

## 2017-11-18 ENCOUNTER — Other Ambulatory Visit: Payer: Self-pay | Admitting: Internal Medicine

## 2017-11-18 DIAGNOSIS — M810 Age-related osteoporosis without current pathological fracture: Secondary | ICD-10-CM | POA: Diagnosis not present

## 2017-11-18 MED ORDER — DENOSUMAB 60 MG/ML ~~LOC~~ SOLN
60.0000 mg | Freq: Once | SUBCUTANEOUS | Status: AC
Start: 2017-11-18 — End: 2017-11-18
  Administered 2017-11-18: 60 mg via SUBCUTANEOUS

## 2017-11-18 NOTE — Telephone Encounter (Signed)
REFILL 

## 2017-11-18 NOTE — Progress Notes (Signed)
Patient was seen in office for a prolia injection.Patient received the injection in her left arm subcutaneous.Patient tolerated well

## 2017-11-19 ENCOUNTER — Other Ambulatory Visit: Payer: Self-pay

## 2017-11-19 MED ORDER — CLOPIDOGREL BISULFATE 75 MG PO TABS: 75.0000 mg | ORAL_TABLET | Freq: Every day | ORAL | 2 refills | Status: DC

## 2017-11-25 ENCOUNTER — Ambulatory Visit (INDEPENDENT_AMBULATORY_CARE_PROVIDER_SITE_OTHER): Payer: Medicare Other | Admitting: Pharmacist

## 2017-11-25 DIAGNOSIS — I481 Persistent atrial fibrillation: Secondary | ICD-10-CM

## 2017-11-25 DIAGNOSIS — Z5181 Encounter for therapeutic drug level monitoring: Secondary | ICD-10-CM | POA: Diagnosis not present

## 2017-11-25 DIAGNOSIS — I4819 Other persistent atrial fibrillation: Secondary | ICD-10-CM

## 2017-11-25 LAB — POCT INR: INR: 2.8

## 2017-11-25 NOTE — Patient Instructions (Signed)
Continue taking 2 tablets everyday except 3 tablets on Mondays and Fridays.  Continue on same amount of SlimFast 4 days a week on Tuesdays, Thursdays, Saturdays and Sundays.  Recheck INR in 4 weeks. Call with any questions. [phone]

## 2017-12-01 ENCOUNTER — Other Ambulatory Visit: Payer: Self-pay

## 2017-12-01 MED ORDER — PANTOPRAZOLE SODIUM 40 MG PO TBEC: 40.0000 mg | DELAYED_RELEASE_TABLET | Freq: Every day | ORAL | 2 refills | Status: DC

## 2017-12-07 ENCOUNTER — Other Ambulatory Visit: Payer: Self-pay | Admitting: Internal Medicine

## 2017-12-11 ENCOUNTER — Other Ambulatory Visit: Payer: Self-pay | Admitting: Internal Medicine

## 2017-12-29 ENCOUNTER — Ambulatory Visit: Payer: Medicare Other | Admitting: *Deleted

## 2017-12-29 ENCOUNTER — Encounter (INDEPENDENT_AMBULATORY_CARE_PROVIDER_SITE_OTHER): Payer: Self-pay

## 2017-12-29 DIAGNOSIS — Z5181 Encounter for therapeutic drug level monitoring: Secondary | ICD-10-CM

## 2017-12-29 DIAGNOSIS — I481 Persistent atrial fibrillation: Secondary | ICD-10-CM | POA: Diagnosis not present

## 2017-12-29 DIAGNOSIS — I4819 Other persistent atrial fibrillation: Secondary | ICD-10-CM

## 2017-12-29 LAB — POCT INR: INR: 4.2

## 2017-12-29 NOTE — Patient Instructions (Signed)
Description   Do not take any Coumadin today and tomorrow take 1 tablet then continue taking 2 tablets everyday except 3 tablets on Mondays and Fridays.  Continue on same amount of SlimFast 4 days a week on Tuesdays, Thursdays, Saturdays and Sundays.  Recheck INR in 2 weeks. Call with any questions. [phone]

## 2018-01-01 ENCOUNTER — Other Ambulatory Visit: Payer: Self-pay | Admitting: *Deleted

## 2018-01-01 MED ORDER — ATORVASTATIN CALCIUM 80 MG PO TABS: 80.0000 mg | ORAL_TABLET | Freq: Every day | ORAL | 3 refills | Status: DC

## 2018-01-11 ENCOUNTER — Other Ambulatory Visit: Payer: Self-pay | Admitting: Internal Medicine

## 2018-01-12 ENCOUNTER — Ambulatory Visit: Payer: Medicare Other | Admitting: *Deleted

## 2018-01-12 DIAGNOSIS — I4819 Other persistent atrial fibrillation: Secondary | ICD-10-CM

## 2018-01-12 DIAGNOSIS — I481 Persistent atrial fibrillation: Secondary | ICD-10-CM

## 2018-01-12 DIAGNOSIS — Z5181 Encounter for therapeutic drug level monitoring: Secondary | ICD-10-CM

## 2018-01-12 LAB — POCT INR: INR: 2.3

## 2018-01-12 NOTE — Patient Instructions (Signed)
Description   Continue taking 2 tablets everyday except 3 tablets on Mondays and Fridays.  Continue on same amount of SlimFast 4 days a week on Tuesdays, Thursdays, Saturdays and Sundays.  Recheck INR in 3 weeks. Call with any questions. [phone]      

## 2018-01-12 NOTE — Telephone Encounter (Signed)
Rx(s) sent to pharmacy electronically.  

## 2018-02-02 ENCOUNTER — Ambulatory Visit (INDEPENDENT_AMBULATORY_CARE_PROVIDER_SITE_OTHER): Payer: Medicare Other | Admitting: *Deleted

## 2018-02-02 DIAGNOSIS — I481 Persistent atrial fibrillation: Secondary | ICD-10-CM

## 2018-02-02 DIAGNOSIS — Z5181 Encounter for therapeutic drug level monitoring: Secondary | ICD-10-CM

## 2018-02-02 DIAGNOSIS — I4819 Other persistent atrial fibrillation: Secondary | ICD-10-CM

## 2018-02-02 LAB — POCT INR: INR: 2.1

## 2018-02-02 NOTE — Patient Instructions (Signed)
Description   Continue taking 2 tablets everyday except 3 tablets on Mondays and Fridays.  Continue on same amount of SlimFast 4 days a week on Tuesdays, Thursdays, Saturdays and Sundays.  Recheck INR in 4 weeks. Call with any questions. [phone]      

## 2018-03-02 ENCOUNTER — Ambulatory Visit: Payer: Medicare Other | Admitting: Pharmacist

## 2018-03-02 DIAGNOSIS — Z5181 Encounter for therapeutic drug level monitoring: Secondary | ICD-10-CM

## 2018-03-02 DIAGNOSIS — I4819 Other persistent atrial fibrillation: Secondary | ICD-10-CM

## 2018-03-02 DIAGNOSIS — I481 Persistent atrial fibrillation: Secondary | ICD-10-CM

## 2018-03-02 LAB — POCT INR: INR: 2.2

## 2018-03-02 NOTE — Patient Instructions (Signed)
Continue taking 2 tablets everyday except 3 tablets on Mondays and Fridays.  Continue on same amount of SlimFast 4 days a week on Tuesdays, Thursdays, Saturdays and Sundays.  Recheck INR in 4 weeks. Call with any questions. [phone]

## 2018-04-13 ENCOUNTER — Encounter (INDEPENDENT_AMBULATORY_CARE_PROVIDER_SITE_OTHER): Payer: Self-pay

## 2018-04-13 ENCOUNTER — Ambulatory Visit: Payer: Medicare Other | Admitting: *Deleted

## 2018-04-13 DIAGNOSIS — Z5181 Encounter for therapeutic drug level monitoring: Secondary | ICD-10-CM

## 2018-04-13 DIAGNOSIS — I4819 Other persistent atrial fibrillation: Secondary | ICD-10-CM

## 2018-04-13 DIAGNOSIS — I481 Persistent atrial fibrillation: Secondary | ICD-10-CM | POA: Diagnosis not present

## 2018-04-13 LAB — POCT INR: INR: 2.8

## 2018-04-13 NOTE — Patient Instructions (Signed)
Description   Continue taking 2 tablets everyday except 3 tablets on Mondays and Fridays.  Continue on same amount of SlimFast 4 days a week on Tuesdays, Thursdays, Saturdays and Sundays.  Recheck INR in 6 weeks. Call with any questions. [phone]

## 2018-05-18 ENCOUNTER — Ambulatory Visit: Payer: Medicare Other

## 2018-05-21 ENCOUNTER — Other Ambulatory Visit: Payer: Self-pay

## 2018-05-21 ENCOUNTER — Ambulatory Visit (INDEPENDENT_AMBULATORY_CARE_PROVIDER_SITE_OTHER): Payer: Medicare Other | Admitting: *Deleted

## 2018-05-21 DIAGNOSIS — M810 Age-related osteoporosis without current pathological fracture: Secondary | ICD-10-CM | POA: Diagnosis not present

## 2018-05-21 MED ORDER — DENOSUMAB 60 MG/ML ~~LOC~~ SOSY
60.0000 mg | PREFILLED_SYRINGE | Freq: Once | SUBCUTANEOUS | Status: AC
Start: 2018-05-21 — End: 2018-05-21
  Administered 2018-05-21: 60 mg via SUBCUTANEOUS

## 2018-05-21 NOTE — Progress Notes (Signed)
Patient seen in office for Prolia injection.   Administered in R arm.   Tolerated SQ administration well.

## 2018-05-23 ENCOUNTER — Other Ambulatory Visit: Payer: Self-pay | Admitting: Family Medicine

## 2018-05-26 ENCOUNTER — Ambulatory Visit: Payer: Medicare Other | Admitting: *Deleted

## 2018-05-26 DIAGNOSIS — I481 Persistent atrial fibrillation: Secondary | ICD-10-CM

## 2018-05-26 DIAGNOSIS — Z5181 Encounter for therapeutic drug level monitoring: Secondary | ICD-10-CM

## 2018-05-26 DIAGNOSIS — I4819 Other persistent atrial fibrillation: Secondary | ICD-10-CM

## 2018-05-26 LAB — POCT INR: INR: 4.3 — AB (ref 2.0–3.0)

## 2018-05-26 NOTE — Patient Instructions (Signed)
Description   Do not take coumadin today May 28th and no coumadin on May 29th then continue taking 2 tablets everyday except 3 tablets on Mondays and Fridays.  Continue on same amount of SlimFast 4 days a week on Tuesdays, Thursdays, Saturdays and Sundays.  Recheck INR in 2 weeks. Call with any questions. [phone]

## 2018-06-09 ENCOUNTER — Ambulatory Visit: Payer: Medicare Other | Admitting: *Deleted

## 2018-06-09 DIAGNOSIS — I481 Persistent atrial fibrillation: Secondary | ICD-10-CM

## 2018-06-09 DIAGNOSIS — I4819 Other persistent atrial fibrillation: Secondary | ICD-10-CM

## 2018-06-09 DIAGNOSIS — Z5181 Encounter for therapeutic drug level monitoring: Secondary | ICD-10-CM | POA: Diagnosis not present

## 2018-06-09 LAB — POCT INR: INR: 1.9 — AB (ref 2.0–3.0)

## 2018-06-09 NOTE — Patient Instructions (Signed)
Description   Today take 3 tablets,  then continue taking 2 tablets everyday except 3 tablets on Mondays and Fridays.  Continue on same amount of SlimFast 4 days a week on Tuesdays, Thursdays, Saturdays and Sundays.  Recheck INR in 2 weeks. Call with any questions. [phone]

## 2018-06-23 ENCOUNTER — Ambulatory Visit: Payer: Medicare Other | Admitting: *Deleted

## 2018-06-23 DIAGNOSIS — I481 Persistent atrial fibrillation: Secondary | ICD-10-CM | POA: Diagnosis not present

## 2018-06-23 DIAGNOSIS — Z5181 Encounter for therapeutic drug level monitoring: Secondary | ICD-10-CM | POA: Diagnosis not present

## 2018-06-23 DIAGNOSIS — I4819 Other persistent atrial fibrillation: Secondary | ICD-10-CM

## 2018-06-23 LAB — POCT INR: INR: 4.1 — AB (ref 2.0–3.0)

## 2018-06-23 NOTE — Patient Instructions (Signed)
Description   Do not take coumadin today June 25th then  continue taking 2 tablets everyday except 3 tablets on Mondays and Fridays.  Continue on same amount of SlimFast 4 days a week on Tuesdays, Thursdays, Saturdays and Sundays.  Recheck INR in 2 weeks. Call with any questions. [phone]  Do good serving of greens today

## 2018-06-24 ENCOUNTER — Other Ambulatory Visit: Payer: Self-pay | Admitting: Family Medicine

## 2018-06-24 DIAGNOSIS — Z1231 Encounter for screening mammogram for malignant neoplasm of breast: Secondary | ICD-10-CM

## 2018-07-08 ENCOUNTER — Ambulatory Visit: Payer: Medicare Other | Admitting: *Deleted

## 2018-07-08 DIAGNOSIS — I481 Persistent atrial fibrillation: Secondary | ICD-10-CM | POA: Diagnosis not present

## 2018-07-08 DIAGNOSIS — I4819 Other persistent atrial fibrillation: Secondary | ICD-10-CM

## 2018-07-08 DIAGNOSIS — Z5181 Encounter for therapeutic drug level monitoring: Secondary | ICD-10-CM | POA: Diagnosis not present

## 2018-07-08 LAB — POCT INR: INR: 2.8 (ref 2.0–3.0)

## 2018-07-08 NOTE — Patient Instructions (Signed)
Description   Continue taking 2 tablets everyday except 3 tablets on Mondays and Fridays.  Continue on same amount of SlimFast 4 days a week on Tuesdays, Thursdays, Saturdays and Sundays.  Recheck INR in 3 weeks. Call with any questions. [phone]

## 2018-07-14 ENCOUNTER — Other Ambulatory Visit: Payer: Self-pay | Admitting: Internal Medicine

## 2018-07-14 NOTE — Telephone Encounter (Signed)
Rx sent to pharmacy   

## 2018-07-29 ENCOUNTER — Ambulatory Visit: Payer: Medicare Other | Admitting: *Deleted

## 2018-07-29 DIAGNOSIS — I481 Persistent atrial fibrillation: Secondary | ICD-10-CM

## 2018-07-29 DIAGNOSIS — Z5181 Encounter for therapeutic drug level monitoring: Secondary | ICD-10-CM

## 2018-07-29 DIAGNOSIS — I4819 Other persistent atrial fibrillation: Secondary | ICD-10-CM

## 2018-07-29 LAB — POCT INR: INR: 2.1 (ref 2.0–3.0)

## 2018-07-29 NOTE — Patient Instructions (Signed)
Description   Continue taking 2 tablets everyday except 3 tablets on Mondays and Fridays.  Continue on same amount of SlimFast 4 days a week on Tuesdays, Thursdays, Saturdays and Sundays.  Recheck INR in 4 weeks. Call with any questions. [phone]

## 2018-08-03 ENCOUNTER — Ambulatory Visit: Payer: Medicare Other

## 2018-08-17 ENCOUNTER — Other Ambulatory Visit: Payer: Self-pay | Admitting: Internal Medicine

## 2018-08-18 ENCOUNTER — Other Ambulatory Visit: Payer: Self-pay | Admitting: Internal Medicine

## 2018-08-26 ENCOUNTER — Ambulatory Visit: Payer: Medicare Other | Admitting: *Deleted

## 2018-08-26 DIAGNOSIS — I4819 Other persistent atrial fibrillation: Secondary | ICD-10-CM

## 2018-08-26 DIAGNOSIS — I481 Persistent atrial fibrillation: Secondary | ICD-10-CM

## 2018-08-26 DIAGNOSIS — Z5181 Encounter for therapeutic drug level monitoring: Secondary | ICD-10-CM

## 2018-08-26 LAB — POCT INR: INR: 4 — AB (ref 2.0–3.0)

## 2018-08-26 NOTE — Patient Instructions (Signed)
Description   Do not take any Coumadin today then continue taking 2 tablets everyday except 3 tablets on Mondays and Fridays.  Continue on same amount of SlimFast 4 days a week on Tuesdays, Thursdays, Saturdays and Sundays.  Recheck INR in 2 weeks. Call with any questions. [phone]

## 2018-09-02 ENCOUNTER — Ambulatory Visit
Admission: RE | Admit: 2018-09-02 | Discharge: 2018-09-02 | Disposition: A | Discharge status: Home / Self Care | Payer: Medicare Other | Source: Ambulatory Visit | Attending: Family Medicine | Admitting: Family Medicine

## 2018-09-02 DIAGNOSIS — Z1231 Encounter for screening mammogram for malignant neoplasm of breast: Secondary | ICD-10-CM | POA: Diagnosis not present

## 2018-09-03 ENCOUNTER — Ambulatory Visit: Payer: Medicare Other

## 2018-09-09 ENCOUNTER — Ambulatory Visit: Payer: Medicare Other | Admitting: *Deleted

## 2018-09-09 DIAGNOSIS — I481 Persistent atrial fibrillation: Secondary | ICD-10-CM | POA: Diagnosis not present

## 2018-09-09 DIAGNOSIS — I4819 Other persistent atrial fibrillation: Secondary | ICD-10-CM

## 2018-09-09 DIAGNOSIS — Z5181 Encounter for therapeutic drug level monitoring: Secondary | ICD-10-CM

## 2018-09-09 LAB — POCT INR: INR: 5.1 — AB (ref 2.0–3.0)

## 2018-09-09 NOTE — Patient Instructions (Addendum)
Description   Do not take any Coumadin tomorrow and No Coumadin Friday then start taking 2 tablets everyday except 3 tablets on Fridays.  Continue on same amount of SlimFast 4 days a week on Tuesdays, Thursdays, Saturdays and Sundays.  Recheck INR in 11days. Call with any questions. [phone]

## 2018-09-14 ENCOUNTER — Encounter: Payer: Self-pay | Admitting: Family Medicine

## 2018-09-16 ENCOUNTER — Ambulatory Visit (INDEPENDENT_AMBULATORY_CARE_PROVIDER_SITE_OTHER): Payer: Medicare Other | Admitting: Physician Assistant

## 2018-09-16 ENCOUNTER — Encounter: Payer: Self-pay | Admitting: Physician Assistant

## 2018-09-16 VITALS — BP 110/60 | HR 72 | Temp 97.7°F | Resp 16 | Wt 167.8 lb

## 2018-09-16 DIAGNOSIS — Z23 Encounter for immunization: Secondary | ICD-10-CM

## 2018-09-16 DIAGNOSIS — I10 Essential (primary) hypertension: Secondary | ICD-10-CM | POA: Diagnosis not present

## 2018-09-16 DIAGNOSIS — E785 Hyperlipidemia, unspecified: Secondary | ICD-10-CM

## 2018-09-16 DIAGNOSIS — E039 Hypothyroidism, unspecified: Secondary | ICD-10-CM | POA: Diagnosis not present

## 2018-09-16 DIAGNOSIS — R413 Other amnesia: Secondary | ICD-10-CM

## 2018-09-16 DIAGNOSIS — F0280 Dementia in other diseases classified elsewhere without behavioral disturbance: Secondary | ICD-10-CM

## 2018-09-16 DIAGNOSIS — I481 Persistent atrial fibrillation: Secondary | ICD-10-CM | POA: Diagnosis not present

## 2018-09-16 DIAGNOSIS — I4819 Other persistent atrial fibrillation: Secondary | ICD-10-CM

## 2018-09-16 DIAGNOSIS — I2510 Atherosclerotic heart disease of native coronary artery without angina pectoris: Secondary | ICD-10-CM | POA: Diagnosis not present

## 2018-09-16 DIAGNOSIS — F0390 Unspecified dementia without behavioral disturbance: Secondary | ICD-10-CM | POA: Insufficient documentation

## 2018-09-16 DIAGNOSIS — I350 Nonrheumatic aortic (valve) stenosis: Secondary | ICD-10-CM | POA: Diagnosis not present

## 2018-09-16 DIAGNOSIS — Z7901 Long term (current) use of anticoagulants: Secondary | ICD-10-CM

## 2018-09-16 MED ORDER — DONEPEZIL HCL 5 MG PO TABS: 5.0000 mg | ORAL_TABLET | Freq: Every day | ORAL | 3 refills | Status: DC

## 2018-09-16 NOTE — Progress Notes (Signed)
Patient ID: [name] MRN: [id number], DOB: 1933/09/09, 84 y.o. Date of Encounter: _0 @  Chief Complaint:  Chief Complaint  Patient presents with  . Constipation  . Rash    both legs    HPI: 84 y.o. year old female  presents with above.    She is accompanied for visit by her daughter and her son's girlfriend. Daughter mentions that patient's husband passed away 1-1/2 weeks ago.  Daughter states that she has been staying with the patient since then.   Daughter knows that since she has been with patient over the past 1.5 weeks she has been hearing her complain of constipation.   States that she was scared to give her too much medicine because did not want to cause diarrhea.  Gave her 1/2 cap of Mirilax for 2 days in a row.  The following day gave her stool softener.  Then the next day gave her 1 full cap of Miralax.  States that even then still barely any stool coming out.  And that stool was very hard.  Their other concern was that they had noticed that her lower legs are dark and brawny color with areas of purpura.  They were concerned that this was something serious.  They have no other specific concerns to address.  I reviewed that she has not had a visit here in a year and has had no lab work other than her PT/INR in 1 year.  Will obtain lab to monitor thyroid and c-Met.  They are making sure she is taking her thyroid medication every morning.  Otherwise, they feel like she is stable. Discussed flu vaccine and agreeable to get flu vaccine today.     Past Medical History:  Diagnosis Date  . A-fib ([place])    a. Dx 01/2015, CHA2DS2VASc = 5-->coumadin started.  Marland Kitchen CAD (coronary artery disease)    a. 01/2015 NSTEMI/PCI: LM nl, LAD 30p, LCX nondom, 30-40p, 50d, OM1 100p (2.75x16 Rebel BMS), RCA mild diff plaque, EF 55%.  . Candida infection, disseminated ([place])   . H/O echocardiogram    a. 01/2015 Echo: EF 50-55%, mild LVH, no rwma, mild MR, mildly dil LA, mod TR, PASP 53  mmHg.  Marland Kitchen Hyperlipidemia   . Hypertension   . Hypothyroid   . Osteoporosis   . Sleep apnea 04/2012   Mild/ AHI 10/CPAP 7cm h2o w/2 L o2     Home Meds: Outpatient Medications Prior to Visit  Medication Sig Dispense Refill  . ALPRAZolam (XANAX) 0.25 MG tablet TAKE 1 TABLET BY MOUTH AT BEDTIME AS NEEDED FOR SLEEP 30 tablet 2  . amLODipine (NORVASC) 5 MG tablet Take 1 tablet (5 mg total) daily by mouth. 90 tablet 3  . atorvastatin (LIPITOR) 80 MG tablet Take 1 tablet (80 mg total) by mouth daily at 6 PM. 90 tablet 3  . calcium carbonate 1250 MG capsule Take 1,250 mg by mouth 2 (two) times daily with a meal.    . cholecalciferol (VITAMIN D) 1000 UNITS tablet Take 1,000 Units by mouth daily.    . clopidogrel (PLAVIX) 75 MG tablet TAKE 1 TABLET BY MOUTH EVERY DAY 90 tablet 2  . cyanocobalamin 1000 MCG tablet Take 100 mcg by mouth daily.    Marland Kitchen levothyroxine (SYNTHROID, LEVOTHROID) 137 MCG tablet TAKE 1 TABLET EVERY DAY 90 tablet 3  . lisinopril (PRINIVIL,ZESTRIL) 5 MG tablet TAKE 1 TABLET BY MOUTH EVERY DAY 30 tablet 9  . metoprolol tartrate (LOPRESSOR) 50 MG tablet TAKE 1  TABLET BY MOUTH TWICE A DAY 60 tablet 3  . NITROSTAT 0.4 MG SL tablet PLACE 1 TAB UNDER THE TONGUE EVERY 5 MIN X 3 DOSES AS NEEDED FOR CHEST PAIN 25 tablet 1  . pantoprazole (PROTONIX) 40 MG tablet Take 1 tablet (40 mg total) by mouth daily. 90 tablet 2  . raloxifene (EVISTA) 60 MG tablet TAKE 1 TABLET (60 MG TOTAL) BY MOUTH DAILY. 90 tablet 3  . warfarin (COUMADIN) 1 MG tablet TAKE 2-3 TABLETS BY MOUTH DAILY AS DIRECTED BY COUMADIN CLINIC 240 tablet 1  . donepezil (ARICEPT) 5 MG tablet TAKE 1 TABLET (5 MG TOTAL) BY MOUTH AT BEDTIME. 90 tablet 3  . clotrimazole-betamethasone (LOTRISONE) cream Apply 1 application topically 2 (two) times daily. 30 g 2  . diclofenac sodium (VOLTAREN) 1 % GEL Apply 4 g topically 4 (four) times daily. Apply to affected toe 100 g 3  . guaiFENesin-codeine 100-10 MG/5ML syrup Take 5 mLs by mouth every 6  (six) hours as needed. 120 mL 0  . meclizine (ANTIVERT) 25 MG tablet TAKE 1 TABLET (25 MG TOTAL) BY MOUTH 3 (THREE) TIMES DAILY AS NEEDED FOR DIZZINESS. 30 tablet 0  . traMADol (ULTRAM) 50 MG tablet TAKE 1 TABLET BY MOUTH EVERY 6 HOURS AS NEEDED FOR PAIN (MUST LAST 30 DAYS) 60 tablet 2   No facility-administered medications prior to visit.     Allergies:  Allergies  Allergen Reactions  . Fosamax [Alendronate Sodium]     Social History   Socioeconomic History  . Marital status: Married    Spouse name: Not on file  . Number of children: Not on file  . Years of education: Not on file  . Highest education level: Not on file  Occupational History  . Not on file  Social Needs  . Financial resource strain: Not on file  . Food insecurity:    Worry: Not on file    Inability: Not on file  . Transportation needs:    Medical: Not on file    Non-medical: Not on file  Tobacco Use  . Smoking status: Former Smoker    Last attempt to quit: 04/28/1995    Years since quitting: 23.4  . Smokeless tobacco: Never Used  Substance and Sexual Activity  . Alcohol use: No    Alcohol/week: 0.0 standard drinks  . Drug use: No  . Sexual activity: Not on file  Lifestyle  . Physical activity:    Days per week: Not on file    Minutes per session: Not on file  . Stress: Not on file  Relationships  . Social connections:    Talks on phone: Not on file    Gets together: Not on file    Attends religious service: Not on file    Active member of club or organization: Not on file    Attends meetings of clubs or organizations: Not on file    Relationship status: Not on file  . Intimate partner violence:    Fear of current or ex partner: Not on file    Emotionally abused: Not on file    Physically abused: Not on file    Forced sexual activity: Not on file  Other Topics Concern  . Not on file  Social History Narrative  . Not on file    Family History  Problem Relation Age of Onset  . Coronary  artery disease Father   . Diabetes Father   . Stroke Mother   . Arthritis Mother  Review of Systems:  See HPI for pertinent ROS. All other ROS negative.    Physical Exam: Blood pressure 110/60, pulse 72, temperature 97.7 F (36.5 C), temperature source Oral, resp. rate 16, weight 76.1 kg, SpO2 99 %., Body mass index is 36.31 kg/m. General: Pleasant, WF. Appears in no acute distress. Neck: Supple. No thyromegaly. No lymphadenopathy. Lungs: Clear bilaterally to auscultation without wheezes, rales, or rhonchi. Breathing is unlabored. Heart: Irregular. III/VI murmur. Abdomen: Soft, non-tender, non-distended with normoactive bowel sounds. No hepatomegaly. No rebound/guarding. No obvious abdominal masses. Musculoskeletal:  Strength and tone normal for age. Extremities/Skin: Warm and dry.  No edema. Brawny skin changes at lower legs bilaterally. Areas of purpura present on low shins bilaterally. Neuro: Alert and oriented X 3. Moves all extremities spontaneously. Gait is normal. CNII-XII grossly in tact. Psych:  Responds to questions appropriately with a normal affect.     ASSESSMENT AND PLAN:  84 y.o. year old female with   1. Essential hypertension Blood pressure is controlled.  Continue current medications.  Check labs to monitor. - COMPLETE METABOLIC PANEL WITH GFR  2. Persistent atrial fibrillation [facility]) Atrial fibrillation is managed by cardiology.  She is on Coumadin.  PT/INR is monitored by cardiology.  Check CBC. - CBC with Differential/Platelet  3. Coronary artery disease involving native coronary artery of native heart without angina pectoris Stable.  Managed by cardiology. - CBC with Differential/Platelet  4. Nonrheumatic aortic valve stenosis Stable.  Managed by cardiology.  5. Hypothyroidism, unspecified type Is on thyroid medication each morning.  Check TSH to monitor. - TSH  6. Hyperlipidemia, unspecified hyperlipidemia type She is not fasting so will not  check lipid panel but will check c-Met. - COMPLETE METABOLIC PANEL WITH GFR  7. Memory loss Daughter is requesting refill on her Aricept. - donepezil (ARICEPT) 5 MG tablet; Take 1 tablet (5 mg total) by mouth at bedtime.  Dispense: 90 tablet; Refill: 3 - CBC with Differential/Platelet - COMPLETE METABOLIC PANEL WITH GFR - TSH  8. Dementia associated with other underlying disease without behavioral disturbance Daughter is requesting refill on her Aricept.  - CBC with Differential/Platelet - COMPLETE METABOLIC PANEL WITH GFR - TSH  9. Long term current use of anticoagulant therapy Atrial fibrillation is managed by cardiology.  She is on Coumadin.  PT/INR is monitored by cardiology.  Check CBC. - CBC with Differential/Platelet  10. Constipation: I have recommended the daughter give her 2 capfuls of MiraLAX daily for 3 days.  If still no little/hard stool at that time then increase to 3 capfuls at a time.  Any point if develops loose stools then can decrease the amount of MiraLAX and adjust accordingly.  I reassured them of findings of her lower legs.  Brawny skin changes secondary to chronic venous insufficiency. Purpura secondary to a mix of skin thinning secondary to older age as well as being on Coumadin and blood thinners.    [address] [place], [place],  09/16/2018 3:49 PM

## 2018-09-17 LAB — CBC WITH DIFFERENTIAL/PLATELET
Basophils Absolute: 27 {cells}/uL (ref 0–200)
Basophils Relative: 0.4 %
Eosinophils Absolute: 121 {cells}/uL (ref 15–500)
Eosinophils Relative: 1.8 %
HCT: 42.6 % (ref 35.0–45.0)
Hemoglobin: 14.1 g/dL (ref 11.7–15.5)
Lymphs Abs: 1374 {cells}/uL (ref 850–3900)
MCH: 29.1 pg (ref 27.0–33.0)
MCHC: 33.1 g/dL (ref 32.0–36.0)
MCV: 88 fL (ref 80.0–100.0)
MPV: 10.5 fL (ref 7.5–12.5)
Monocytes Relative: 8.4 %
Neutro Abs: 4616 {cells}/uL (ref 1500–7800)
Neutrophils Relative %: 68.9 %
Platelets: 221 Thousand/uL (ref 140–400)
RBC: 4.84 Million/uL (ref 3.80–5.10)
RDW: 13.6 % (ref 11.0–15.0)
Total Lymphocyte: 20.5 %
WBC mixed population: 563 {cells}/uL (ref 200–950)
WBC: 6.7 Thousand/uL (ref 3.8–10.8)

## 2018-09-17 LAB — COMPLETE METABOLIC PANEL WITHOUT GFR
AG Ratio: 1.5 (calc) (ref 1.0–2.5)
ALT: 11 U/L (ref 6–29)
AST: 21 U/L (ref 10–35)
Albumin: 3.9 g/dL (ref 3.6–5.1)
Alkaline phosphatase (APISO): 39 U/L (ref 33–130)
BUN: 17 mg/dL (ref 7–25)
CO2: 26 mmol/L (ref 20–32)
Calcium: 9 mg/dL (ref 8.6–10.4)
Chloride: 104 mmol/L (ref 98–110)
Creat: 0.85 mg/dL (ref 0.60–0.88)
GFR, Est African American: 73 mL/min/1.73m2
GFR, Est Non African American: 63 mL/min/1.73m2
Globulin: 2.6 g/dL (ref 1.9–3.7)
Glucose, Bld: 80 mg/dL (ref 65–99)
Potassium: 4.2 mmol/L (ref 3.5–5.3)
Sodium: 140 mmol/L (ref 135–146)
Total Bilirubin: 1 mg/dL (ref 0.2–1.2)
Total Protein: 6.5 g/dL (ref 6.1–8.1)

## 2018-09-17 LAB — TSH: TSH: 1.79 m[IU]/L (ref 0.40–4.50)

## 2018-09-21 ENCOUNTER — Ambulatory Visit: Payer: Medicare Other | Admitting: *Deleted

## 2018-09-21 DIAGNOSIS — Z5181 Encounter for therapeutic drug level monitoring: Secondary | ICD-10-CM | POA: Diagnosis not present

## 2018-09-21 DIAGNOSIS — I481 Persistent atrial fibrillation: Secondary | ICD-10-CM | POA: Diagnosis not present

## 2018-09-21 DIAGNOSIS — I4819 Other persistent atrial fibrillation: Secondary | ICD-10-CM

## 2018-09-21 LAB — POCT INR: INR: 4.6 — AB (ref 2.0–3.0)

## 2018-09-21 NOTE — Patient Instructions (Addendum)
Description   Do not take any Coumadin today and No Coumadin tomorrow then start taking 2 tablets everyday.  Continue on same amount of SlimFast 4 days a week on Tuesdays, Thursdays, Saturdays and Sundays.  Recheck INR in 8 days. Call with any questions. [phone]     Start eating 2 dark green leafy veggies weekly.

## 2018-09-24 ENCOUNTER — Telehealth: Payer: Self-pay | Admitting: Family Medicine

## 2018-09-24 NOTE — Telephone Encounter (Signed)
Pt's daughter Santiago Glad called and states that she was going through pt's pills and came across some Xanax that she had for sleeping and want to know if she could get that refilled for pt as she is not sleeping well since the death of her husband. She has not had them filled in a long time and bottle was empty.  OK to refill ? LRF - 09/02/17  Also Santiago Glad states that she would like to take her mother off the Aricept as it seems to be making her very mean. She can tell a difference in her when she takes it vs when she doesn't. OK for her to DC the Aricept?

## 2018-09-25 ENCOUNTER — Other Ambulatory Visit: Payer: Self-pay | Admitting: Family Medicine

## 2018-09-25 MED ORDER — ALPRAZOLAM 0.25 MG PO TABS: 0.2500 mg | ORAL_TABLET | Freq: Every evening | ORAL | 0 refills | Status: DC | PRN

## 2018-09-25 NOTE — Telephone Encounter (Signed)
Ok with holding aricept.  I would avoid long term sedative use due to dementia as it could possibly worsen memory and increase confusion, but we could temporarily use xanax for the time being   Xanax 0.5 mg poqhs prn insomnia 30.

## 2018-09-28 NOTE — Telephone Encounter (Signed)
Pt's daughter aware.

## 2018-09-29 ENCOUNTER — Ambulatory Visit: Payer: Medicare Other | Admitting: Pharmacist

## 2018-09-29 DIAGNOSIS — I4819 Other persistent atrial fibrillation: Secondary | ICD-10-CM

## 2018-09-29 DIAGNOSIS — Z5181 Encounter for therapeutic drug level monitoring: Secondary | ICD-10-CM

## 2018-09-29 LAB — POCT INR: INR: 2.6 (ref 2.0–3.0)

## 2018-09-29 NOTE — Patient Instructions (Signed)
Description   Continue taking 2 tablets every day.  Continue on same amount of SlimFast 4 days a week on Tuesdays, Thursdays, Saturdays and Sundays.  Recheck INR in 2-3 weeks. Call with any questions. [phone]

## 2018-10-06 ENCOUNTER — Encounter: Payer: Self-pay | Admitting: Family Medicine

## 2018-10-06 ENCOUNTER — Ambulatory Visit (INDEPENDENT_AMBULATORY_CARE_PROVIDER_SITE_OTHER): Payer: Medicare Other | Admitting: Family Medicine

## 2018-10-06 VITALS — BP 140/88 | HR 78 | Temp 97.9°F | Resp 16 | Ht <= 58 in | Wt 168.0 lb

## 2018-10-06 DIAGNOSIS — M7989 Other specified soft tissue disorders: Secondary | ICD-10-CM

## 2018-10-06 DIAGNOSIS — M25552 Pain in left hip: Secondary | ICD-10-CM

## 2018-10-06 DIAGNOSIS — L989 Disorder of the skin and subcutaneous tissue, unspecified: Secondary | ICD-10-CM | POA: Diagnosis not present

## 2018-10-06 DIAGNOSIS — M545 Low back pain, unspecified: Secondary | ICD-10-CM

## 2018-10-06 MED ORDER — TRAMADOL HCL 50 MG PO TABS: 50.0000 mg | ORAL_TABLET | Freq: Three times a day (TID) | ORAL | 0 refills | Status: DC | PRN

## 2018-10-06 NOTE — Progress Notes (Signed)
Subjective:    Patient ID: [name], female    DOB: [birth year], 84 y.o.   MRN: [id number]  HPI Patient has not been seen in almost a year.  Unfortunately, her husband recently passed away.  Patient is battling dementia.  She is now being cared for by her daughter.  Over the last week, a neighbor has noticed significant swelling in her left leg from the superior shin to her ankle.  The leg is markedly swollen compared to the right leg.  The diameter of her calf in the mid shin is approximately 15 inches on the left and at the same level on the right is only 13 inches in diameter.  She has trace pitting edema primarily on the lateral side of the shin.  She denies any pain in that area.  She has a negative homicide.  She is already on Coumadin for atrial fibrillation and her last INR checked October 1 was therapeutic at greater than 2 making a DVT unlikely.  Swelling either represents a venous outflow obstruction or possibly lymphedema.  I favor lymphedema but we need to rule out DVT.  Second issue is that the patient is complaining of severe pain in the posterior aspect of her left hip right at the sciatic notch.  The pain is intense.  She is also having low back pain.  However she also has pain in her left hip with flexion internal rotation and external rotation.  Pain is made worse by movement of the hip as well as standing.  Given the location, I suspect referred pain from likely lumbar radiculopathy rather than hip joint pathology.  The patient denies any falls or injuries.  Third issue is a lesion on her forehead.  It is approximately 1.5 cm in diameter.  It is a pink papular patch with white scale and a central scab and ulceration.  Some of the papules have a pearly iridescent area I suspect squamous cell carcinoma versus basal cell carcinoma.  Apparently this is been there for several months. Past Medical History:  Diagnosis Date  . A-fib ([place])    a. Dx 01/2015, CHA2DS2VASc = 5-->coumadin  started.  Marland Kitchen CAD (coronary artery disease)    a. 01/2015 NSTEMI/PCI: LM nl, LAD 30p, LCX nondom, 30-40p, 50d, OM1 100p (2.75x16 Rebel BMS), RCA mild diff plaque, EF 55%.  . Candida infection, disseminated ([place])   . H/O echocardiogram    a. 01/2015 Echo: EF 50-55%, mild LVH, no rwma, mild MR, mildly dil LA, mod TR, PASP 53 mmHg.  Marland Kitchen Hyperlipidemia   . Hypertension   . Hypothyroid   . Osteoporosis   . Sleep apnea 04/2012   Mild/ AHI 10/CPAP 7cm h2o w/2 L o2   Past Surgical History:  Procedure Laterality Date  . CARDIOVERSION N/A 08/22/2015   Procedure: CARDIOVERSION;  Surgeon: Skeet Latch, MD;  Location: [place];  Service: Cardiovascular;  Laterality: N/A;  . LEFT HEART CATHETERIZATION WITH CORONARY ANGIOGRAM N/A 02/20/2015   Procedure: LEFT HEART CATHETERIZATION WITH CORONARY ANGIOGRAM;  Surgeon: Jolaine Artist, MD;  Location[facility] C[facility] LAB;  Service: Cardiovascular;  Laterality: N/A;  . PERCUTANEOUS CORONARY STENT INTERVENTION (PCI-S)  02/20/2015   Procedure: PERCUTANEOUS CORONARY STENT INTERVENTION (PCI-S);  Surgeon: Jolaine Artist, MD;  Location[facility] CATH LAB;  Service: Cardiovascular;;  OM1  (2.75/28mm Rebel)   Current Outpatient Medications on File Prior to Visit  Medication Sig Dispense Refill  . ALPRAZolam (XANAX) 0.25 MG tablet Take 1 tablet (0.25 mg total) by  mouth at bedtime as needed. for sleep 30 tablet 0  . amLODipine (NORVASC) 5 MG tablet Take 1 tablet (5 mg total) daily by mouth. 90 tablet 3  . atorvastatin (LIPITOR) 80 MG tablet Take 1 tablet (80 mg total) by mouth daily at 6 PM. 90 tablet 3  . calcium carbonate 1250 MG capsule Take 1,250 mg by mouth 2 (two) times daily with a meal.    . cholecalciferol (VITAMIN D) 1000 UNITS tablet Take 1,000 Units by mouth daily.    . clopidogrel (PLAVIX) 75 MG tablet TAKE 1 TABLET BY MOUTH EVERY DAY 90 tablet 2  . cyanocobalamin 1000 MCG tablet Take 100 mcg by mouth daily.    Marland Kitchen levothyroxine (SYNTHROID, LEVOTHROID) 137 MCG  tablet TAKE 1 TABLET EVERY DAY 90 tablet 3  . lisinopril (PRINIVIL,ZESTRIL) 5 MG tablet TAKE 1 TABLET BY MOUTH EVERY DAY 30 tablet 9  . metoprolol tartrate (LOPRESSOR) 50 MG tablet TAKE 1 TABLET BY MOUTH TWICE A DAY 60 tablet 3  . NITROSTAT 0.4 MG SL tablet PLACE 1 TAB UNDER THE TONGUE EVERY 5 MIN X 3 DOSES AS NEEDED FOR CHEST PAIN 25 tablet 1  . pantoprazole (PROTONIX) 40 MG tablet Take 1 tablet (40 mg total) by mouth daily. 90 tablet 2  . raloxifene (EVISTA) 60 MG tablet TAKE 1 TABLET (60 MG TOTAL) BY MOUTH DAILY. 90 tablet 3  . warfarin (COUMADIN) 1 MG tablet TAKE 2-3 TABLETS BY MOUTH DAILY AS DIRECTED BY COUMADIN CLINIC 240 tablet 1   No current facility-administered medications on file prior to visit.    Allergies  Allergen Reactions  . Fosamax [Alendronate Sodium]    Social History   Socioeconomic History  . Marital status: Married    Spouse name: Not on file  . Number of children: Not on file  . Years of education: Not on file  . Highest education level: Not on file  Occupational History  . Not on file  Social Needs  . Financial resource strain: Not on file  . Food insecurity:    Worry: Not on file    Inability: Not on file  . Transportation needs:    Medical: Not on file    Non-medical: Not on file  Tobacco Use  . Smoking status: Former Smoker    Last attempt to quit: 04/28/1995    Years since quitting: 23.4  . Smokeless tobacco: Never Used  Substance and Sexual Activity  . Alcohol use: No    Alcohol/week: 0.0 standard drinks  . Drug use: No  . Sexual activity: Not on file  Lifestyle  . Physical activity:    Days per week: Not on file    Minutes per session: Not on file  . Stress: Not on file  Relationships  . Social connections:    Talks on phone: Not on file    Gets together: Not on file    Attends religious service: Not on file    Active member of club or organization: Not on file    Attends meetings of clubs or organizations: Not on file     Relationship status: Not on file  . Intimate partner violence:    Fear of current or ex partner: Not on file    Emotionally abused: Not on file    Physically abused: Not on file    Forced sexual activity: Not on file  Other Topics Concern  . Not on file  Social History Narrative  . Not on file  Review of Systems  All other systems reviewed and are negative.      Objective:   Physical Exam  Constitutional: She is oriented to person, place, and time. She appears well-developed and well-nourished. No distress.  HENT:  Head:    Cardiovascular: Normal rate. An irregularly irregular rhythm present.  Murmur heard. Pulmonary/Chest: Effort normal and breath sounds normal. No stridor. No respiratory distress. She has no wheezes. She has no rales.  Abdominal: Soft. Bowel sounds are normal.  Musculoskeletal: She exhibits edema.       Left hip: She exhibits decreased range of motion and tenderness.       Lumbar back: She exhibits decreased range of motion, tenderness and pain. She exhibits no bony tenderness and no spasm.       Back:  Neurological: She is alert and oriented to person, place, and time.  Skin: She is not diaphoretic.  Vitals reviewed.         Assessment & Plan:  Left low back pain, unspecified chronicity, unspecified whether sciatica present - Plan: DG Lumbar Spine Complete  Pain of left hip joint - Plan: DG HIP UNILAT WITH PELVIS 2-3 VIEWS LEFT  Left leg swelling - Plan: VAS [place] LOWER EXTREMITY VENOUS (DVT)  Skin lesion of face  Main issue is the unilateral leg swelling in the left leg.  I favor lymphedema however I will obtain an ultrasound of the leg tomorrow to rule out a DVT.  If ultrasound is negative for DVT, I would recommend an Unna boot to treat the swelling and then focus on compressive stockings thereafter to manage.  I believe the pain in the posterior left hip is likely lumbar radiculopathy.  Obtain an x-ray of the lower back as well as an x-ray  of the hip joint to evaluate further.  If the patient does continue to have left hip pain appears to be lumbar radiculopathy, we could try to treat symptomatically with gabapentin.  If there is significant arthritis in the left hip however I will consult orthopedics for possible cortisone injection in the hip.  Third issue is the lesion on her face which appears to be a skin cancer.  After we have the first 2 issues addressed, I would recommend a dermatology consultation for an excisional biopsy.  Meanwhile treat the patient's pain with tramadol 50 mg every 6 hours as needed pain.  Await the results of the venous ultrasound

## 2018-10-07 ENCOUNTER — Ambulatory Visit (HOSPITAL_COMMUNITY)
Admission: RE | Admit: 2018-10-07 | Discharge: 2018-10-07 | Disposition: A | Discharge status: Home / Self Care | Payer: Medicare Other | Source: Ambulatory Visit | Attending: Family Medicine | Admitting: Family Medicine

## 2018-10-07 ENCOUNTER — Ambulatory Visit
Admission: RE | Admit: 2018-10-07 | Discharge: 2018-10-07 | Disposition: A | Discharge status: Home / Self Care | Payer: Medicare Other | Source: Ambulatory Visit | Attending: Family Medicine | Admitting: Family Medicine

## 2018-10-07 DIAGNOSIS — M47816 Spondylosis without myelopathy or radiculopathy, lumbar region: Secondary | ICD-10-CM | POA: Diagnosis not present

## 2018-10-07 DIAGNOSIS — M545 Low back pain, unspecified: Secondary | ICD-10-CM

## 2018-10-07 DIAGNOSIS — M1612 Unilateral primary osteoarthritis, left hip: Secondary | ICD-10-CM | POA: Diagnosis not present

## 2018-10-07 DIAGNOSIS — M7989 Other specified soft tissue disorders: Secondary | ICD-10-CM | POA: Insufficient documentation

## 2018-10-07 DIAGNOSIS — M25552 Pain in left hip: Secondary | ICD-10-CM

## 2018-10-07 NOTE — Progress Notes (Addendum)
Preliminary notes--Left lower extremity venous duplex exam completed. Negative for DVT where veins visualized . Limited study due to patient has edema and severe back pain,some segments of calf veins cannot completely exclude the possible existence of thrombosis. Popliteal vein slightly dilated. Result attempted to call ordering physician's office, no answers. I left voice message but will e-fax to Dr. Dennard Schaumann as well. Patient went home.  [name] (RDMS RVT) 10/07/18 9:49 AM

## 2018-10-08 ENCOUNTER — Ambulatory Visit: Payer: Medicare Other | Admitting: Internal Medicine

## 2018-10-08 ENCOUNTER — Encounter: Payer: Self-pay | Admitting: Internal Medicine

## 2018-10-08 VITALS — BP 108/70 | HR 82 | Ht 62.0 in | Wt 169.4 lb

## 2018-10-08 DIAGNOSIS — I350 Nonrheumatic aortic (valve) stenosis: Secondary | ICD-10-CM | POA: Diagnosis not present

## 2018-10-08 DIAGNOSIS — I4819 Other persistent atrial fibrillation: Secondary | ICD-10-CM | POA: Diagnosis not present

## 2018-10-08 NOTE — Progress Notes (Signed)
OFFICE NOTE  Chief Complaint:  Follow-up a-fib, aortic stenosis  Primary Care Physician: [name], MD  HPI:  [name] is an 84 yo female with a prior history of hypertension, hyperlipidemia, hypothyroidism, sleep apnea, and osteoporosis in the setting of chronic steroid therapy. She was recently admitted to [facility] with severe chest and back discomfort. EKG showed rate-controlled atrial fibrillation which was a new diagnosis. She ruled in for MI. Echo showed normal LV function. Cath revealed an occluded obtuse marginal branch of the left circumflex with other wise nonobstructive disease and normal LV function. The obtuse marginal was successfully stented using a 2.75 x 16 mm Rebel BMS. She did well postprocedure. With new onset A. fib, she was placed on Coumadin with a plan to continue aspirin, Plavix, and warfarin for 1 month and then to discontinue aspirin at that time.  Since discharge, she has done well. She has not had any chest pain or dyspnea. She says that she has been taking it fairly easy because she was.  She denies chest pain, palpitations, dyspnea, pnd, orthopnea, n, v, dizziness, syncope, edema, weight gain, or early satiety.  Right groin catheterization site has been healing well. She was noted to have significant tibial infection with sores under both breasts. She was given Diflucan in the hospital but no further treatment was initiated. She also had new onset atrial fibrillation and has been on warfarin. There've been no attempts at cardioversion.  [name] returns today for follow-up. She is in persistent A. fib. She's currently on Plavix and warfarin. I reviewed her INRs which are been followed at [facility] and they indicate that she's been subtherapeutic several times. We cannot continue with cardioversion at this time. I've asked Erasmo Downer, our anticoagulation pharmacist at [facility] to possibly follow her for the next month to try to get her INR  therapeutic. She continues to feel somewhat fatigued and may benefit from cardioversion.   I head pleasure seeing [name] back in the office today. She recently underwent elective cardioversion by my partner Dr. Oval Linsey. Unfortunately this was unsuccessful and she remains in atrial fibrillation. She says however she does feel stronger and she is asymptomatic at this time. She's had resolution of her candidal infection under her breasts with a treatment of a topical anti-fungal. INR is mildly supratherapeutic.  10/03/2016  [name] returns today for follow-up. Recently she had an upper respiratory infection and blood pressure was noted to be elevated. This is mostly resolved however blood pressure still is elevated and heart rate is in the upper 80s. I cut back her beta blocker in the past however it looks like she may need to be on the full dose now. In addition she has allowed her aortic stenosis murmur today. Her last echo was in February 2016. She reports some fatigue however is basically inactive at home. I have encouraged exercise.  10/08/2017  [name] was seen today in follow-up. She recent saw primary care provider and he felt that she was doing pretty well. She's had some memory loss. Overall though she denied any chest pain or worsening shortness of breath. She has long-standing persistent not permanent A. fib. Her INRs have been stable. She has a history of sleep apnea on CPAP. Blood pressure is well controlled. Recently a lipid profile showed LDL-C at goal of 45. She also has a history of aortic stenosis. Her last echo in 2017 showed progression of her aortic valve disease to moderate  aortic stenosis. She reports no symptoms associated with this. She uses a walker but denies any recent falls at home.  10/08/2018  [name] returns today for follow-up.  He remains in A. fib at 84 and has had fairly stable INR.  Recently 2.6 on October 1.  She had labs as well which showed total  cholesterol 116, HDL 41, LDL 53 and triglycerides 134.  Blood pressure is actually somewhat low today at 108/70.  Reports some fatigue although has no chest pain or shortness of breath.  She is known to have moderate aortic stenosis her last echo in October 2018 with EF of 60 to 65%.  PMHx:  Past Medical History:  Diagnosis Date  . A-fib ([place])    a. Dx 01/2015, CHA2DS2VASc = 5-->coumadin started.  Marland Kitchen CAD (coronary artery disease)    a. 01/2015 NSTEMI/PCI: LM nl, LAD 30p, LCX nondom, 30-40p, 50d, OM1 100p (2.75x16 Rebel BMS), RCA mild diff plaque, EF 55%.  . Candida infection, disseminated ([place])   . H/O echocardiogram    a. 01/2015 Echo: EF 50-55%, mild LVH, no rwma, mild MR, mildly dil LA, mod TR, PASP 53 mmHg.  Marland Kitchen Hyperlipidemia   . Hypertension   . Hypothyroid   . Osteoporosis   . Sleep apnea 04/2012   Mild/ AHI 10/CPAP 7cm h2o w/2 L o2    Past Surgical History:  Procedure Laterality Date  . CARDIOVERSION N/A 08/22/2015   Procedure: CARDIOVERSION;  Surgeon: Skeet Latch, MD;  Location: [place];  Service: Cardiovascular;  Laterality: N/A;  . LEFT HEART CATHETERIZATION WITH CORONARY ANGIOGRAM N/A 02/20/2015   Procedure: LEFT HEART CATHETERIZATION WITH CORONARY ANGIOGRAM;  Surgeon: Jolaine Artist, MD;  Location: [facility] CATH LAB;  Service: Cardiovascular;  Laterality: N/A;  . PERCUTANEOUS CORONARY STENT INTERVENTION (PCI-S)  02/20/2015   Procedure: PERCUTANEOUS CORONARY STENT INTERVENTION (PCI-S);  Surgeon: Jolaine Artist, MD;  Location: [facility] CATH LAB;  Service: Cardiovascular;;  OM1  (2.75/6mm Rebel)    FAMHx:  Family History  Problem Relation Age of Onset  . Coronary artery disease Father   . Diabetes Father   . Stroke Mother   . Arthritis Mother     SOCHx:   reports that she quit smoking about 23 years ago. She has never used smokeless tobacco. She reports that she does not drink alcohol or use drugs.  ALLERGIES:  Allergies  Allergen Reactions  . Fosamax [Alendronate  Sodium]     ROS: Pertinent items noted in HPI and remainder of comprehensive ROS otherwise negative.  HOME MEDS: Current Outpatient Medications  Medication Sig Dispense Refill  . ALPRAZolam (XANAX) 0.25 MG tablet Take 1 tablet (0.25 mg total) by mouth at bedtime as needed. for sleep 30 tablet 0  . amLODipine (NORVASC) 5 MG tablet Take 1 tablet (5 mg total) daily by mouth. 90 tablet 3  . atorvastatin (LIPITOR) 80 MG tablet Take 1 tablet (80 mg total) by mouth daily at 6 PM. 90 tablet 3  . calcium carbonate 1250 MG capsule Take 1,250 mg by mouth 2 (two) times daily with a meal.    . cholecalciferol (VITAMIN D) 1000 UNITS tablet Take 1,000 Units by mouth daily.    . clopidogrel (PLAVIX) 75 MG tablet TAKE 1 TABLET BY MOUTH EVERY DAY 90 tablet 2  . cyanocobalamin 1000 MCG tablet Take 100 mcg by mouth daily.    Marland Kitchen levothyroxine (SYNTHROID, LEVOTHROID) 137 MCG tablet TAKE 1 TABLET EVERY DAY 90 tablet 3  . lisinopril (PRINIVIL,ZESTRIL) 5 MG tablet TAKE  1 TABLET BY MOUTH EVERY DAY 30 tablet 9  . metoprolol tartrate (LOPRESSOR) 50 MG tablet TAKE 1 TABLET BY MOUTH TWICE A DAY 60 tablet 3  . NITROSTAT 0.4 MG SL tablet PLACE 1 TAB UNDER THE TONGUE EVERY 5 MIN X 3 DOSES AS NEEDED FOR CHEST PAIN 25 tablet 1  . pantoprazole (PROTONIX) 40 MG tablet Take 1 tablet (40 mg total) by mouth daily. 90 tablet 2  . raloxifene (EVISTA) 60 MG tablet TAKE 1 TABLET (60 MG TOTAL) BY MOUTH DAILY. 90 tablet 3  . traMADol (ULTRAM) 50 MG tablet Take 1 tablet (50 mg total) by mouth every 8 (eight) hours as needed. 30 tablet 0  . warfarin (COUMADIN) 1 MG tablet TAKE 2-3 TABLETS BY MOUTH DAILY AS DIRECTED BY COUMADIN CLINIC 240 tablet 1   No current facility-administered medications for this visit.     LABS/IMAGING: No results found for this or any previous visit (from the past 48 hour(s)). Dg Lumbar Spine Complete  Result Date: 10/07/2018 CLINICAL DATA:  Chronic lower back pain. EXAM: LUMBAR SPINE - COMPLETE 4+ VIEW  COMPARISON:  Radiographs of September 05, 2009. FINDINGS: No fracture or spondylolisthesis is noted. Severe degenerative disc disease is noted at L1-2, L2-3, L3-4 and L5-S1. Mild degenerative disc disease is noted at L4-5. Atherosclerosis of abdominal aorta is noted. IMPRESSION: Severe multilevel degenerative disc disease. No acute abnormality seen in the lumbar spine. Electronically Signed   By: Marijo Conception, M.D.   On: 10/07/2018 15:22   Dg Hip Unilat With Pelvis 2-3 Views Left  Result Date: 10/07/2018 CLINICAL DATA:  Chronic left hip pain without known injury. EXAM: DG HIP (WITH OR WITHOUT PELVIS) 2-3V LEFT COMPARISON:  None. FINDINGS: There is no evidence of hip fracture or dislocation. Mild narrowing of the left hip joint is noted. IMPRESSION: Mild degenerative joint disease of the left hip. No acute abnormality is seen. Electronically Signed   By: Marijo Conception, M.D.   On: 10/07/2018 15:24    WEIGHTS: Wt Readings from Last 3 Encounters:  10/08/18 169 lb 6.4 oz (76.8 kg)  10/06/18 168 lb (76.2 kg)  09/16/18 167 lb 12.8 oz (76.1 kg)    VITALS: BP 108/70   Pulse 82   Ht 5\' 2"  (1.575 m)   Wt 169 lb 6.4 oz (76.8 kg)   BMI 30.98 kg/m   EXAM: General appearance: alert and no distress Neck: no carotid bruit, no JVD and thyroid not enlarged, symmetric, no tenderness/mass/nodules Lungs: clear to auscultation bilaterally Heart: regular rate and rhythm, S1, S2 normal and systolic murmur: late systolic 3/6, crescendo at 2nd right intercostal space Abdomen: soft, non-tender; bowel sounds normal; no masses,  no organomegaly Extremities: extremities normal, atraumatic, no cyanosis or edema Pulses: 2+ and symmetric Skin: Skin color, texture, turgor normal. No rashes or lesions Neurologic: Grossly normal Psych: Pleasant  EKG: A. fib at 84, low voltage QRS-personally reviewed  ASSESSMENT: 1. NSTEMI-status post bare metal stent to obtuse marginal 2. Persistent atrial fibrillation on  warfarin -  Failed cardioversion, ?symptomatic - CHADVASC 5 3. Dyslipidemia 4. Hypertension 5. Aortic stenosis 6. Memory loss, question early dementia  PLAN: 1.   [name] reports some fatigue but denies any chest pain or dyspnea.  She has moderate aortic stenosis and is due for repeat echocardiogram.  She has been therapeutic on warfarin.  She has had issues with her memory loss which is accelerating.  Follow-up in 6 months or sooner as necessary.  Pixie Casino,  MD, FACC, [place] Director of the [facility]  Cardiovascular Risk Reduction Clinic Diplomate of the American Board of Clinical Lipidology Attending Cardiologist  Direct Dial: [phone]  Fax: [phone]  Website:  [email or web address] 10/08/2018, 9:19 AM

## 2018-10-08 NOTE — Patient Instructions (Signed)
Medication Instructions:  Continue current medications If you need a refill on your cardiac medications before your next appointment, please call your pharmacy.   Lab work: NONE  Testing/Procedures: Your physician has requested that you have an echocardiogram. Echocardiography is a painless test that uses sound waves to create images of your heart. It provides your doctor with information about the size and shape of your heart and how well your heart's chambers and valves are working. This procedure takes approximately one hour. There are no restrictions for this procedure. -- done at [address] - [address]  Follow-Up: At [facility], you and your health needs are our priority.  As part of our continuing mission to provide you with exceptional heart care, we have created designated Provider Care Teams.  These Care Teams include your primary Cardiologist (physician) and Advanced Practice Providers (APPs -  Physician Assistants and Nurse Practitioners) who all work together to provide you with the care you need, when you need it. You will need a follow up appointment in 6 months.  Please call our office 2 months in advance to schedule this appointment.  You may see Pixie Casino, MD or one of the following Advanced Practice Providers on your designated Care Team: [place], [place] . Fabian Sharp, PA-C  Any Other Special Instructions Will Be Listed Below (If Applicable).

## 2018-10-11 ENCOUNTER — Encounter: Payer: Self-pay | Admitting: Internal Medicine

## 2018-10-15 ENCOUNTER — Other Ambulatory Visit: Payer: Self-pay

## 2018-10-15 ENCOUNTER — Ambulatory Visit (HOSPITAL_COMMUNITY): Payer: Medicare Other | Attending: Cardiovascular Disease

## 2018-10-15 ENCOUNTER — Ambulatory Visit: Payer: Medicare Other | Admitting: *Deleted

## 2018-10-15 DIAGNOSIS — I4819 Other persistent atrial fibrillation: Secondary | ICD-10-CM

## 2018-10-15 DIAGNOSIS — I350 Nonrheumatic aortic (valve) stenosis: Secondary | ICD-10-CM | POA: Diagnosis not present

## 2018-10-15 DIAGNOSIS — Z5181 Encounter for therapeutic drug level monitoring: Secondary | ICD-10-CM | POA: Diagnosis not present

## 2018-10-15 LAB — POCT INR: INR: 3.2 — AB (ref 2.0–3.0)

## 2018-10-15 NOTE — Patient Instructions (Signed)
Description   Today take 1 tablet then continue taking 2 tablets every day.  Continue on same amount of SlimFast 4 days a week on Tuesdays, Thursdays, Saturdays and Sundays.  Recheck INR in 2 weeks. Call with any questions. [phone]

## 2018-10-16 ENCOUNTER — Ambulatory Visit
Admission: RE | Admit: 2018-10-16 | Discharge: 2018-10-16 | Disposition: A | Discharge status: Home / Self Care | Payer: Medicare Other | Source: Ambulatory Visit | Attending: Family Medicine | Admitting: Family Medicine

## 2018-10-16 ENCOUNTER — Telehealth: Payer: Self-pay | Admitting: Internal Medicine

## 2018-10-16 ENCOUNTER — Ambulatory Visit (INDEPENDENT_AMBULATORY_CARE_PROVIDER_SITE_OTHER): Payer: Medicare Other | Admitting: Family Medicine

## 2018-10-16 ENCOUNTER — Encounter: Payer: Self-pay | Admitting: Family Medicine

## 2018-10-16 VITALS — BP 110/72 | HR 71 | Temp 97.6°F | Resp 16 | Ht <= 58 in | Wt 168.0 lb

## 2018-10-16 DIAGNOSIS — S79912A Unspecified injury of left hip, initial encounter: Secondary | ICD-10-CM | POA: Diagnosis not present

## 2018-10-16 DIAGNOSIS — M25552 Pain in left hip: Secondary | ICD-10-CM | POA: Diagnosis not present

## 2018-10-16 DIAGNOSIS — W19XXXA Unspecified fall, initial encounter: Secondary | ICD-10-CM | POA: Diagnosis not present

## 2018-10-16 DIAGNOSIS — M533 Sacrococcygeal disorders, not elsewhere classified: Secondary | ICD-10-CM | POA: Diagnosis not present

## 2018-10-16 DIAGNOSIS — M25551 Pain in right hip: Secondary | ICD-10-CM | POA: Diagnosis not present

## 2018-10-16 DIAGNOSIS — S60222A Contusion of left hand, initial encounter: Secondary | ICD-10-CM | POA: Diagnosis not present

## 2018-10-16 DIAGNOSIS — S79911A Unspecified injury of right hip, initial encounter: Secondary | ICD-10-CM | POA: Diagnosis not present

## 2018-10-16 NOTE — Telephone Encounter (Signed)
Returned call to daughter. Clarified results with her. Explained no need for med/treatment plan changes or immediate follow up based on results

## 2018-10-16 NOTE — Telephone Encounter (Signed)
New Message   Patients daughter Santiago Glad is returning call in reference to echocardiogram results. Please call.

## 2018-10-16 NOTE — Progress Notes (Signed)
Subjective:    Patient ID: [name], female    DOB: 1933/07/26, 84 y.o.   MRN: [id number]  HPI Patient fell Tuesday evening in the bathroom.  She landed on her left hand and distal left wrist.  There is significant bruising in that area at the present time.  She is on Coumadin.  However she only reports pain around her tailbone.  Examination of the left wrist reveals bruising onto the dorsum of the left hand, all around the left wrist, and approximately 6 inches up the left forearm on the volar surface.  It appears to be slowly starting to resolve.  There is no swelling however.  She has full flexion and extension of the left wrist without pain.  There is no tenderness to palpation in the anatomic snuffbox.  There is no tenderness to palpation over the distal radial styloid or the distal radius.  There is no tenderness to palpation over the distal ulna.  There is no tenderness to palpation over the olecranon process.  She has full supination and pronation without pain.  There is no swelling or deformity in the left hand.  There is no tenderness to palpation over the metacarpals.  She has full flexion and extension of all the MCP, PIP, and DIP joints.  Therefore I believe she simply has bruising likely secondary to Coumadin and a contusion to the hand.  There is no evidence of an occult fracture.  She does have tenderness to palpation over the tip of the coccyx.  She has no pain in her hips with flexion, internal rotation, external rotation.  There is no tenderness to palpation over the greater trochanter bilaterally.  She denies any hip pain.  She has no tenderness to palpation over the lumbar spinous processes. Past Medical History:  Diagnosis Date  . A-fib ([place])    a. Dx 01/2015, CHA2DS2VASc = 5-->coumadin started.  Marland Kitchen CAD (coronary artery disease)    a. 01/2015 NSTEMI/PCI: LM nl, LAD 30p, LCX nondom, 30-40p, 50d, OM1 100p (2.75x16 Rebel BMS), RCA mild diff plaque, EF 55%.  . Candida infection,  disseminated ([place])   . H/O echocardiogram    a. 01/2015 Echo: EF 50-55%, mild LVH, no rwma, mild MR, mildly dil LA, mod TR, PASP 53 mmHg.  Marland Kitchen Hyperlipidemia   . Hypertension   . Hypothyroid   . Osteoporosis   . Sleep apnea 04/2012   Mild/ AHI 10/CPAP 7cm h2o w/2 L o2   Past Surgical History:  Procedure Laterality Date  . CARDIOVERSION N/A 08/22/2015   Procedure: CARDIOVERSION;  Surgeon: Skeet Latch, MD;  Location: [place];  Service: Cardiovascular;  Laterality: N/A;  . LEFT HEART CATHETERIZATION WITH CORONARY ANGIOGRAM N/A 02/20/2015   Procedure: LEFT HEART CATHETERIZATION WITH CORONARY ANGIOGRAM;  Surgeon: Jolaine Artist, MD;  Location: [facility] CATH LAB;  Service: Cardiovascular;  Laterality: N/A;  . PERCUTANEOUS CORONARY STENT INTERVENTION (PCI-S)  02/20/2015   Procedure: PERCUTANEOUS CORONARY STENT INTERVENTION (PCI-S);  Surgeon: Jolaine Artist, MD;  Location: [facility] CATH LAB;  Service: Cardiovascular;;  OM1  (2.75/44mm Rebel)   Current Outpatient Medications on File Prior to Visit  Medication Sig Dispense Refill  . ALPRAZolam (XANAX) 0.25 MG tablet Take 1 tablet (0.25 mg total) by mouth at bedtime as needed. for sleep 30 tablet 0  . amLODipine (NORVASC) 5 MG tablet Take 1 tablet (5 mg total) daily by mouth. 90 tablet 3  . atorvastatin (LIPITOR) 80 MG tablet Take 1 tablet (80 mg total) by mouth  daily at 6 PM. 90 tablet 3  . calcium carbonate 1250 MG capsule Take 1,250 mg by mouth 2 (two) times daily with a meal.    . cholecalciferol (VITAMIN D) 1000 UNITS tablet Take 1,000 Units by mouth daily.    . clopidogrel (PLAVIX) 75 MG tablet TAKE 1 TABLET BY MOUTH EVERY DAY 90 tablet 2  . cyanocobalamin 1000 MCG tablet Take 100 mcg by mouth daily.    Marland Kitchen levothyroxine (SYNTHROID, LEVOTHROID) 137 MCG tablet TAKE 1 TABLET EVERY DAY 90 tablet 3  . lisinopril (PRINIVIL,ZESTRIL) 5 MG tablet TAKE 1 TABLET BY MOUTH EVERY DAY 30 tablet 9  . metoprolol tartrate (LOPRESSOR) 50 MG tablet TAKE 1 TABLET  BY MOUTH TWICE A DAY 60 tablet 3  . pantoprazole (PROTONIX) 40 MG tablet Take 1 tablet (40 mg total) by mouth daily. 90 tablet 2  . raloxifene (EVISTA) 60 MG tablet TAKE 1 TABLET (60 MG TOTAL) BY MOUTH DAILY. 90 tablet 3  . traMADol (ULTRAM) 50 MG tablet Take 1 tablet (50 mg total) by mouth every 8 (eight) hours as needed. 30 tablet 0  . warfarin (COUMADIN) 1 MG tablet TAKE 2-3 TABLETS BY MOUTH DAILY AS DIRECTED BY COUMADIN CLINIC 240 tablet 1  . NITROSTAT 0.4 MG SL tablet PLACE 1 TAB UNDER THE TONGUE EVERY 5 MIN X 3 DOSES AS NEEDED FOR CHEST PAIN (Patient not taking: Reported on 10/16/2018) 25 tablet 1   No current facility-administered medications on file prior to visit.    Allergies  Allergen Reactions  . Fosamax [Alendronate Sodium]    Social History   Socioeconomic History  . Marital status: Married    Spouse name: Not on file  . Number of children: Not on file  . Years of education: Not on file  . Highest education level: Not on file  Occupational History  . Not on file  Social Needs  . Financial resource strain: Not on file  . Food insecurity:    Worry: Not on file    Inability: Not on file  . Transportation needs:    Medical: Not on file    Non-medical: Not on file  Tobacco Use  . Smoking status: Former Smoker    Last attempt to quit: 04/28/1995    Years since quitting: 23.4  . Smokeless tobacco: Never Used  Substance and Sexual Activity  . Alcohol use: No    Alcohol/week: 0.0 standard drinks  . Drug use: No  . Sexual activity: Not on file  Lifestyle  . Physical activity:    Days per week: Not on file    Minutes per session: Not on file  . Stress: Not on file  Relationships  . Social connections:    Talks on phone: Not on file    Gets together: Not on file    Attends religious service: Not on file    Active member of club or organization: Not on file    Attends meetings of clubs or organizations: Not on file    Relationship status: Not on file  . Intimate  partner violence:    Fear of current or ex partner: Not on file    Emotionally abused: Not on file    Physically abused: Not on file    Forced sexual activity: Not on file  Other Topics Concern  . Not on file  Social History Narrative  . Not on file      Review of Systems  All other systems reviewed and are negative.  Objective:   Physical Exam  Constitutional: She is oriented to person, place, and time. She appears well-developed and well-nourished. No distress.  HENT:  Head:    Cardiovascular: Normal rate. An irregularly irregular rhythm present.  Murmur heard. Pulmonary/Chest: Effort normal and breath sounds normal. No stridor. No respiratory distress. She has no wheezes. She has no rales.  Abdominal: Soft. Bowel sounds are normal.  Musculoskeletal: She exhibits edema.       Left wrist: She exhibits swelling. She exhibits normal range of motion, no tenderness, no bony tenderness, no effusion, no crepitus, no deformity and no laceration.       Right hip: Normal. She exhibits normal range of motion, normal strength, no tenderness, no bony tenderness, no swelling, no crepitus and no deformity.       Left hip: She exhibits decreased range of motion. She exhibits no tenderness, no bony tenderness, no swelling, no crepitus and no deformity.       Lumbar back: She exhibits decreased range of motion. She exhibits no tenderness, no bony tenderness, no swelling, no pain and no spasm.       Hands: Neurological: She is alert and oriented to person, place, and time.  Skin: She is not diaphoretic.  Vitals reviewed.         Assessment & Plan:  Fall, initial encounter - Plan: DG HIPS BILAT WITH PELVIS 2V  Contusion of left hand, initial encounter  Coccydynia  Patient certainly contusion to the dorsum of her left hand as well as the volar surface of her left forearm however there is no evidence of a fracture or pain or decreased range of motion.  Therefore I do not believe  she needs any x-rays of this.  I believe the bruising is due to Coumadin.  She does have some coccydynia however I think she is fortunate that there is no evidence of a hip or pelvic fracture.  I did recommend an x-ray of the hips and pelvis to be certain however I believe that the patient bruised her coccyx when she fell and otherwise suffered no other fracture.  I would recommend symptomatic treatment with tramadol assuming x-rays are normal

## 2018-10-16 NOTE — Telephone Encounter (Signed)
I called patient, no answer on home machine, I then called daughter, who was with mother and states they are on the way to PCP office now and will sign DPR sheet, patient gave me permission over the phone to give information to daughter. I advised of note from ECHO, patient daughter was confused on if she needed to come back in an be seen, I did advise he did not say so, but she would like to check with Eliezer Lofts, Therapist, sports. I will send a message to [facility] and have her clarify.  Thanks!

## 2018-10-29 ENCOUNTER — Ambulatory Visit: Payer: Medicare Other | Admitting: *Deleted

## 2018-10-29 DIAGNOSIS — I4819 Other persistent atrial fibrillation: Secondary | ICD-10-CM | POA: Diagnosis not present

## 2018-10-29 DIAGNOSIS — Z5181 Encounter for therapeutic drug level monitoring: Secondary | ICD-10-CM

## 2018-10-29 LAB — POCT INR: INR: 3.3 — AB (ref 2.0–3.0)

## 2018-10-29 NOTE — Patient Instructions (Signed)
Description   Skip today's dose, then start taking 2 tablets everyday except 1 tablet on Tuesdays and Saturdays.   Continue on same amount of SlimFast 4 days a week on Tuesdays, Thursdays, Saturdays and Sundays.  Recheck INR in 2 weeks. Call with any questions. [phone]

## 2018-11-02 ENCOUNTER — Other Ambulatory Visit: Payer: Self-pay | Admitting: Family Medicine

## 2018-11-02 MED ORDER — ALPRAZOLAM 0.25 MG PO TABS: 0.2500 mg | ORAL_TABLET | Freq: Every evening | ORAL | 0 refills | Status: DC | PRN

## 2018-11-02 MED ORDER — TRAMADOL HCL 50 MG PO TABS: 50.0000 mg | ORAL_TABLET | Freq: Three times a day (TID) | ORAL | 0 refills | Status: DC | PRN

## 2018-11-02 NOTE — Telephone Encounter (Signed)
Pt needs refill on xanax and tramadol to [facility].

## 2018-11-02 NOTE — Telephone Encounter (Signed)
Requesting refill      LOV: 10/16/18  LRF:  09/25/18 Xanax 10/06/18 Tramadol

## 2018-11-07 ENCOUNTER — Other Ambulatory Visit: Payer: Self-pay | Admitting: Family Medicine

## 2018-11-12 ENCOUNTER — Other Ambulatory Visit: Payer: Self-pay | Admitting: Internal Medicine

## 2018-11-12 ENCOUNTER — Ambulatory Visit: Payer: Medicare Other | Admitting: *Deleted

## 2018-11-12 DIAGNOSIS — I4819 Other persistent atrial fibrillation: Secondary | ICD-10-CM

## 2018-11-12 DIAGNOSIS — Z5181 Encounter for therapeutic drug level monitoring: Secondary | ICD-10-CM | POA: Diagnosis not present

## 2018-11-12 LAB — POCT INR: INR: 4 — AB (ref 2.0–3.0)

## 2018-11-12 NOTE — Patient Instructions (Signed)
Description   Skip today's dose, then start taking 2 tablets everyday except 1 tablet on Tuesdays, Thursdays and Saturdays.   Continue on same amount of SlimFast 4 days a week on Tuesdays, Thursdays, Saturdays and Sundays. Restart eating your 2 servings of leafy green vegetable each week.  Recheck INR in 2 weeks. Call with any questions. [phone]

## 2018-11-18 ENCOUNTER — Ambulatory Visit (INDEPENDENT_AMBULATORY_CARE_PROVIDER_SITE_OTHER): Payer: Medicare Other | Admitting: Family Medicine

## 2018-11-18 DIAGNOSIS — M810 Age-related osteoporosis without current pathological fracture: Secondary | ICD-10-CM

## 2018-11-18 MED ORDER — DENOSUMAB 60 MG/ML ~~LOC~~ SOSY
60.0000 mg | PREFILLED_SYRINGE | SUBCUTANEOUS | Status: DC
  Administered 2018-11-18 – 2019-11-29 (×3): 60 mg via SUBCUTANEOUS

## 2018-11-19 ENCOUNTER — Other Ambulatory Visit: Payer: Self-pay | Admitting: Internal Medicine

## 2018-11-20 ENCOUNTER — Other Ambulatory Visit: Payer: Self-pay | Admitting: Internal Medicine

## 2018-11-25 ENCOUNTER — Ambulatory Visit: Payer: Medicare Other

## 2018-11-25 DIAGNOSIS — Z5181 Encounter for therapeutic drug level monitoring: Secondary | ICD-10-CM

## 2018-11-25 DIAGNOSIS — I4819 Other persistent atrial fibrillation: Secondary | ICD-10-CM | POA: Diagnosis not present

## 2018-11-25 LAB — POCT INR: INR: 1.7 — AB (ref 2.0–3.0)

## 2018-11-25 NOTE — Patient Instructions (Signed)
Description   Take 3 tablets today, then start taking 2 tablets everyday except 1 tablet on Tuesdays and Saturdays.   Continue on same amount of SlimFast 4 days a week on Tuesdays, Thursdays, Saturdays and Sundays. Restart eating your 2 servings of leafy green vegetable each week.  Recheck INR in 2 weeks. Call with any questions. [phone]

## 2018-12-09 ENCOUNTER — Ambulatory Visit: Payer: Medicare Other | Admitting: *Deleted

## 2018-12-09 DIAGNOSIS — I4819 Other persistent atrial fibrillation: Secondary | ICD-10-CM

## 2018-12-09 DIAGNOSIS — Z5181 Encounter for therapeutic drug level monitoring: Secondary | ICD-10-CM | POA: Diagnosis not present

## 2018-12-09 LAB — POCT INR: INR: 2.2 (ref 2.0–3.0)

## 2018-12-09 NOTE — Patient Instructions (Signed)
Description   Continue  taking 2 tablets everyday except 1 tablet on Tuesdays and Saturdays.   Continue on same amount of SlimFast 4 days a week on Tuesdays, Thursdays, Saturdays and Sundays. Restart eating your 2 servings of leafy green vegetable each week.  Recheck INR in 3 weeks. Call with any questions. [phone]

## 2018-12-10 ENCOUNTER — Telehealth: Payer: Self-pay | Admitting: Family Medicine

## 2018-12-10 NOTE — Telephone Encounter (Signed)
pts daughter forgot to give pt her metoprolol, atorvastatin, and her Citracal wants [place] to advise her on what to do

## 2018-12-10 NOTE — Telephone Encounter (Signed)
agreed

## 2018-12-10 NOTE — Telephone Encounter (Signed)
Returned call to patient daughter Santiago Glad.   States that she missed afternoon doses of medication on 12/09/2018.  States that patient is WNL and no increased BP noted. Medications given as directed on 12/10/2018.  Advised one missed dose should not cause any lasting harm. Monitor patient and call if any changes noted.   MD to be made aware.

## 2018-12-17 IMAGING — MG 2D DIGITAL SCREENING BILATERAL MAMMOGRAM WITH CAD AND ADJUNCT TO
8 of 14 series · 8 of 30 positions shown · non-contrast
Comparison: Previous exam(s).

CLINICAL DATA: Screening.

EXAM:
2D DIGITAL SCREENING BILATERAL MAMMOGRAM WITH CAD AND ADJUNCT TOMO

[R MLO (1 of 2)]
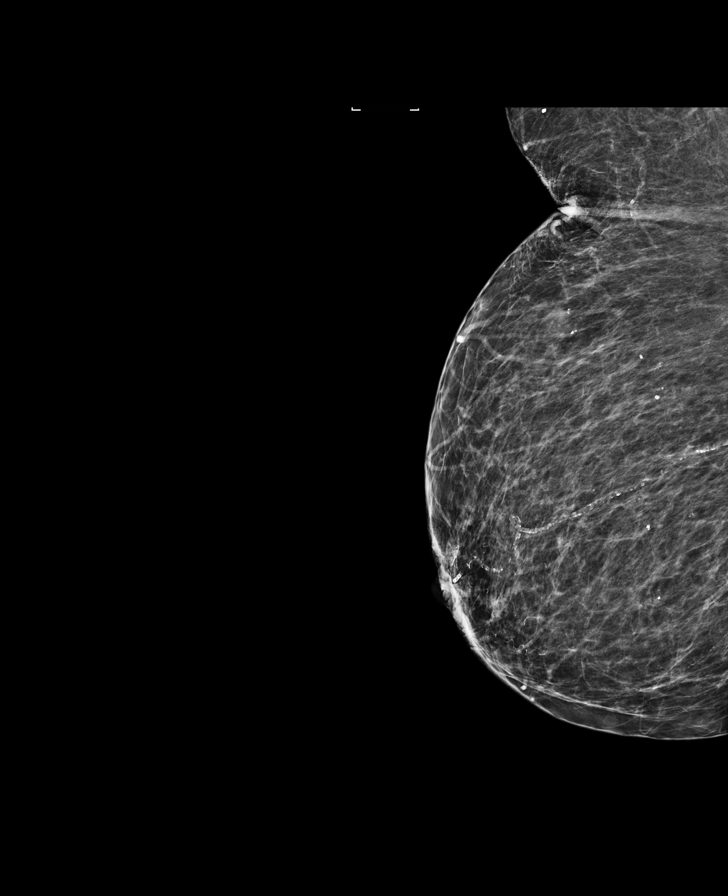

[L MLO (1 of 2)]
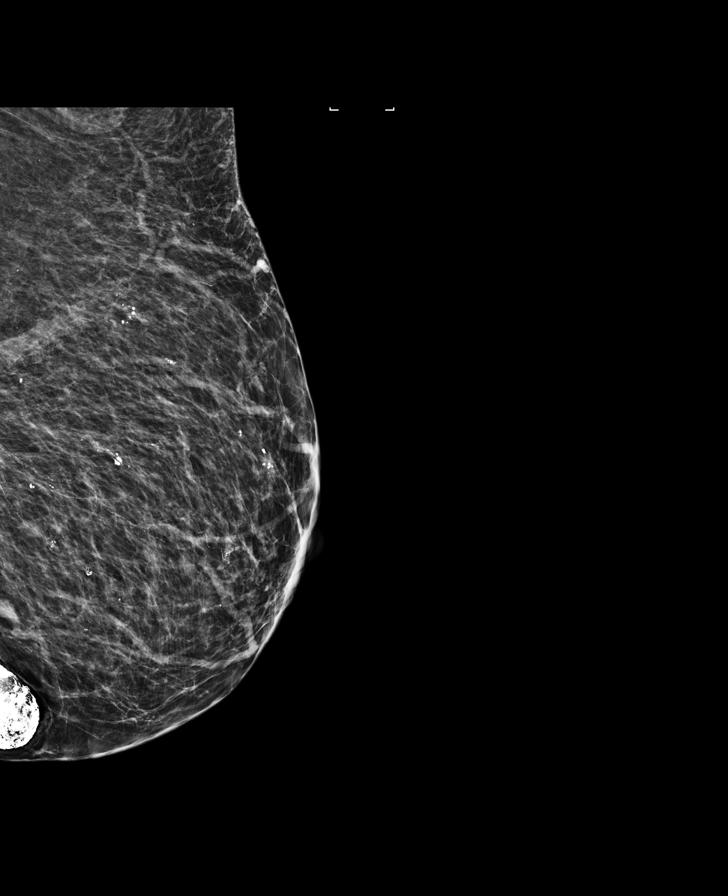

[R CC synth-2D]
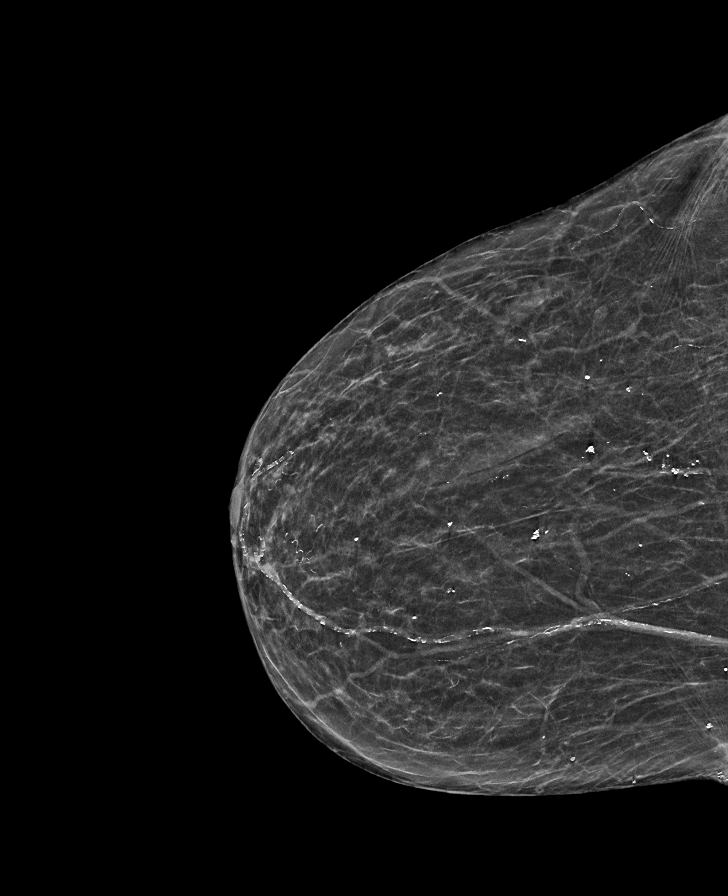

[L MLO (2 of 2)]
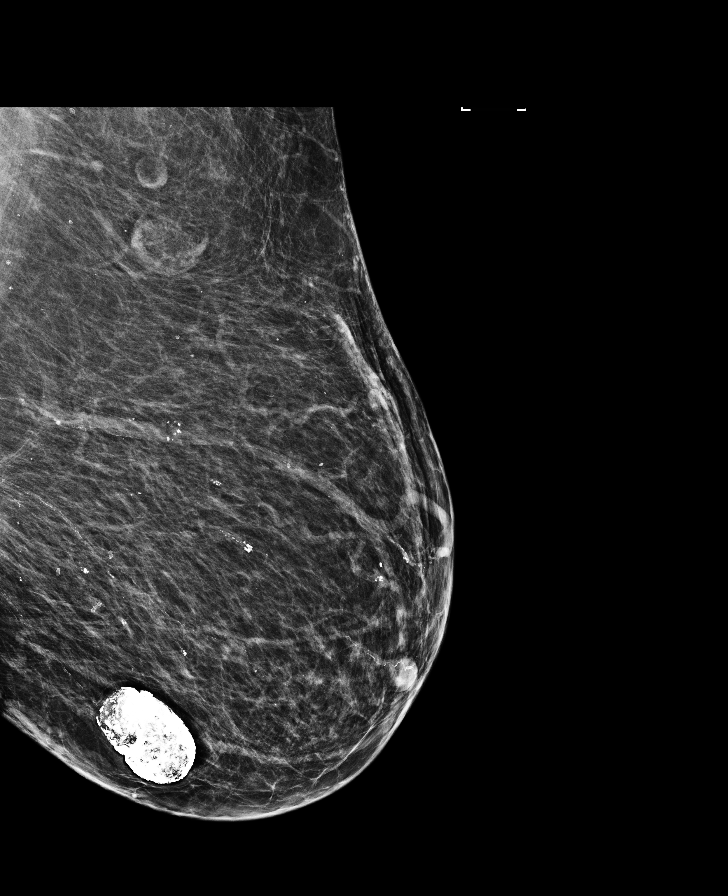

[L MLO synth-2D]
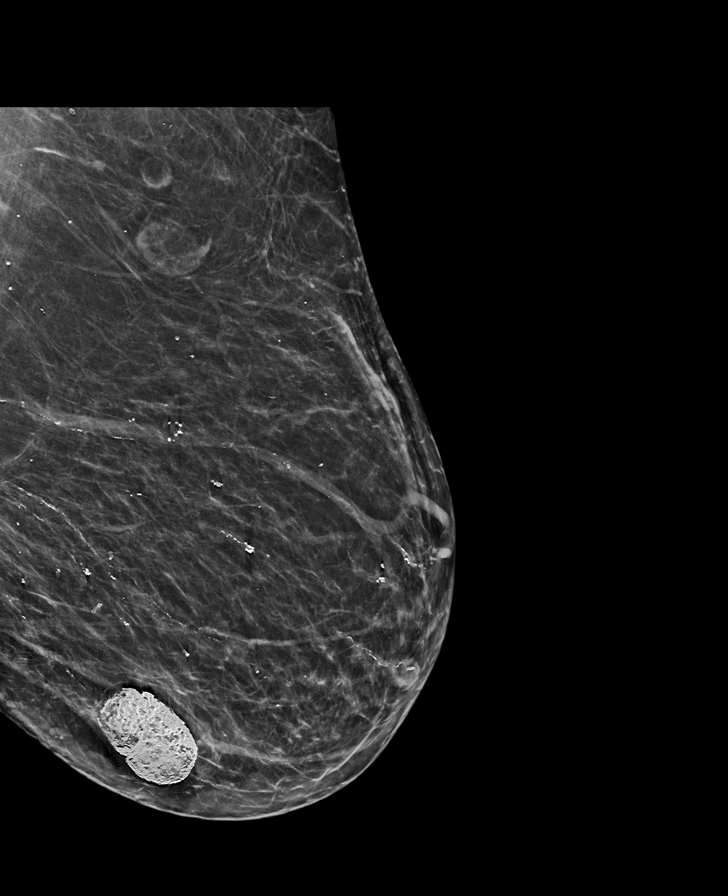

[R MLO (2 of 2)]
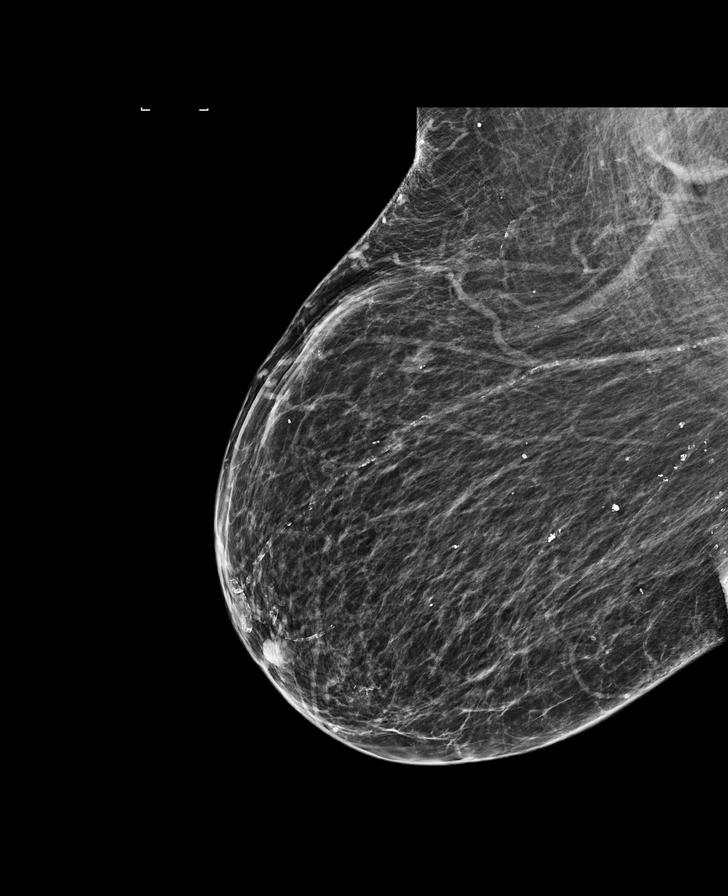

[R CC]
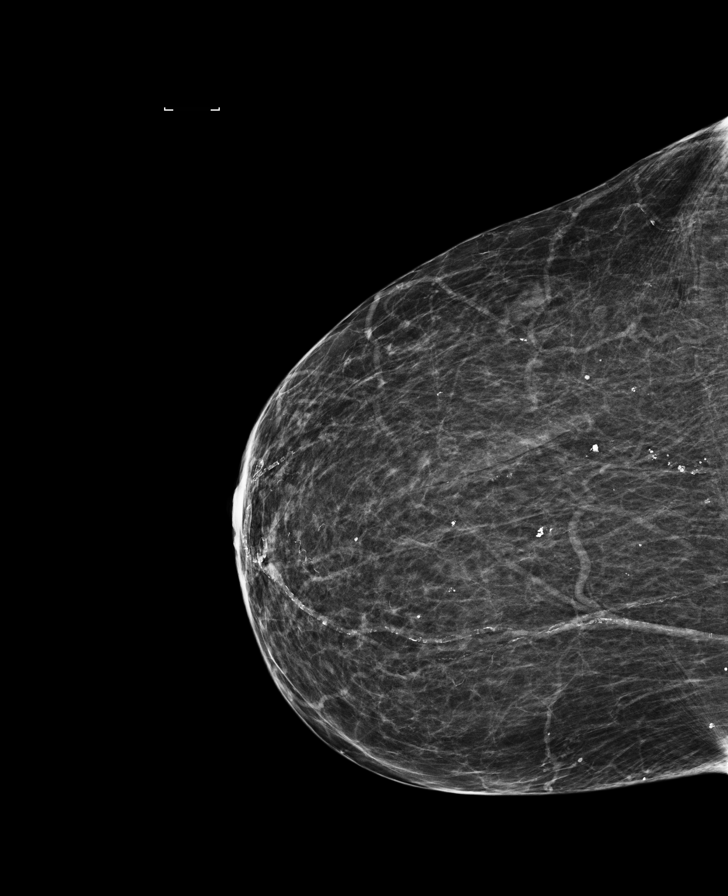

[L CC synth-2D]
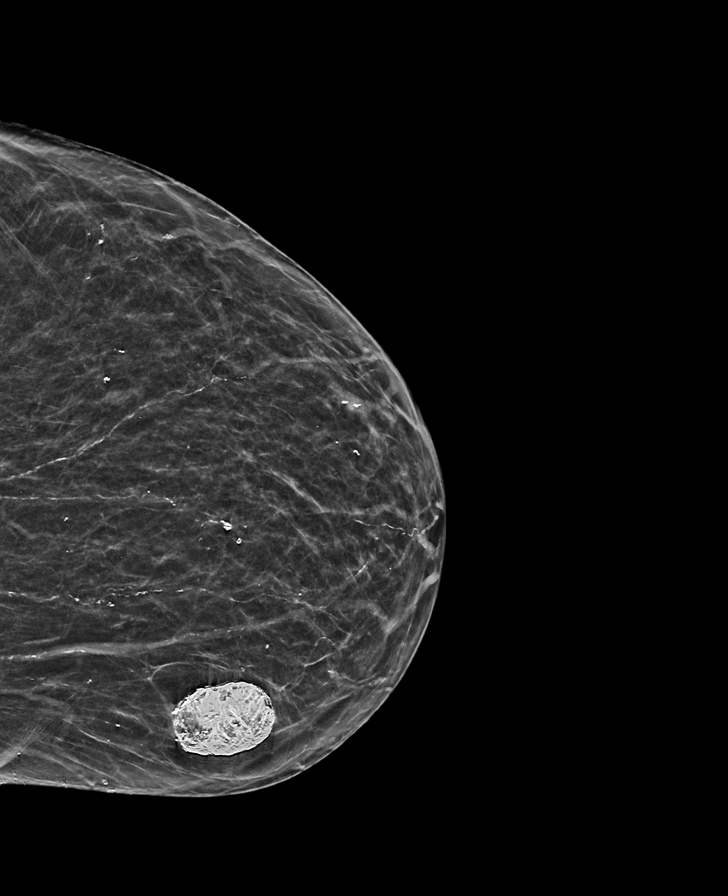

[8 of 30 positions shown; findings below may reference images not displayed]

ACR Breast Density Category b: There are scattered areas of
fibroglandular density.
FINDINGS: There are no findings suspicious for malignancy. Images were
processed with CAD.
IMPRESSION: No mammographic evidence of malignancy. A result letter of this
screening mammogram will be mailed directly to the patient.

RECOMMENDATION:
Screening mammogram in one year. (Code:97-6-RS4)

BI-RADS CATEGORY  1: Negative.

## 2018-12-21 ENCOUNTER — Other Ambulatory Visit: Payer: Self-pay | Admitting: Internal Medicine

## 2018-12-31 ENCOUNTER — Ambulatory Visit: Payer: Medicare Other | Admitting: *Deleted

## 2018-12-31 DIAGNOSIS — Z5181 Encounter for therapeutic drug level monitoring: Secondary | ICD-10-CM

## 2018-12-31 DIAGNOSIS — I4819 Other persistent atrial fibrillation: Secondary | ICD-10-CM

## 2018-12-31 LAB — POCT INR: INR: 2.2 (ref 2.0–3.0)

## 2018-12-31 NOTE — Patient Instructions (Signed)
Description   Continue  taking 2 tablets everyday except 1 tablet on Tuesdays and Saturdays.   Continue on same amount of SlimFast 4 days a week on Tuesdays, Thursdays, Saturdays and Sundays. Continue eating your 2 servings of leafy green vegetable each week.  Recheck INR in 4 weeks. Call with any questions. [phone]

## 2019-01-28 ENCOUNTER — Ambulatory Visit: Payer: Medicare Other | Admitting: Pharmacist

## 2019-01-28 DIAGNOSIS — I4819 Other persistent atrial fibrillation: Secondary | ICD-10-CM | POA: Diagnosis not present

## 2019-01-28 DIAGNOSIS — Z5181 Encounter for therapeutic drug level monitoring: Secondary | ICD-10-CM

## 2019-01-28 LAB — POCT INR: INR: 2.9 (ref 2.0–3.0)

## 2019-01-28 NOTE — Patient Instructions (Signed)
Continue  taking 2 tablets everyday except 1 tablet on Tuesdays and Saturdays.   Continue on same amount of SlimFast 4 days a week on Tuesdays, Thursdays, Saturdays and Sundays. Continue eating your 2 servings of leafy green vegetable each week.  Recheck INR in 4 weeks. Call with any questions. [phone]

## 2019-02-12 ENCOUNTER — Ambulatory Visit (INDEPENDENT_AMBULATORY_CARE_PROVIDER_SITE_OTHER): Payer: Medicare Other | Admitting: Family Medicine

## 2019-02-12 ENCOUNTER — Encounter: Payer: Self-pay | Admitting: Family Medicine

## 2019-02-12 VITALS — BP 118/60 | HR 70 | Temp 98.3°F | Resp 18 | Ht <= 58 in | Wt 160.0 lb

## 2019-02-12 DIAGNOSIS — C44300 Unspecified malignant neoplasm of skin of unspecified part of face: Secondary | ICD-10-CM | POA: Diagnosis not present

## 2019-02-12 DIAGNOSIS — J40 Bronchitis, not specified as acute or chronic: Secondary | ICD-10-CM | POA: Diagnosis not present

## 2019-02-12 MED ORDER — DOXYCYCLINE HYCLATE 100 MG PO TABS: 100.0000 mg | ORAL_TABLET | Freq: Two times a day (BID) | ORAL | 0 refills | Status: DC

## 2019-02-12 NOTE — Progress Notes (Signed)
Subjective:    Patient ID: [name], female    DOB: 02/05/1933, 85 y.o.   MRN: [id number]  HPI Patient presents today with a cough for 1 week.  Cough is productive of yellow sputum.  She also reports increasing chest congestion and some shortness of breath.  She reports subjective fevers.  She denies any hemoptysis.  She denies any purulent sputum.  She denies any chest pain.  She is on Coumadin for atrial fibrillation.  Of note there is a lesion on her forehead that appears to be a skin cancer.  They had deferred treatment for this previously however this lesion seems to be spreading.  I mentioned it again to the patient and her daughter today and they are now willing to see a dermatologist to deal with the issue.  Past Medical History:  Diagnosis Date  . A-fib ([place])    a. Dx 01/2015, CHA2DS2VASc = 5-->coumadin started.  Marland Kitchen CAD (coronary artery disease)    a. 01/2015 NSTEMI/PCI: LM nl, LAD 30p, LCX nondom, 30-40p, 50d, OM1 100p (2.75x16 Rebel BMS), RCA mild diff plaque, EF 55%.  . Candida infection, disseminated ([place])   . H/O echocardiogram    a. 01/2015 Echo: EF 50-55%, mild LVH, no rwma, mild MR, mildly dil LA, mod TR, PASP 53 mmHg.  Marland Kitchen Hyperlipidemia   . Hypertension   . Hypothyroid   . Osteoporosis   . Sleep apnea 04/2012   Mild/ AHI 10/CPAP 7cm h2o w/2 L o2   Past Surgical History:  Procedure Laterality Date  . CARDIOVERSION N/A 08/22/2015   Procedure: CARDIOVERSION;  Surgeon: Skeet Latch, MD;  Location: [facility];  Service: Cardiovascular;  Laterality: N/A;  . LEFT HEART CATHETERIZATION WITH CORONARY ANGIOGRAM N/A 02/20/2015   Procedure: LEFT HEART CATHETERIZATION WITH CORONARY ANGIOGRAM;  Surgeon: Jolaine Artist, MD;  Location: [facility] CATH LAB;  Service: Cardiovascular;  Laterality: N/A;  . PERCUTANEOUS CORONARY STENT INTERVENTION (PCI-S)  02/20/2015   Procedure: PERCUTANEOUS CORONARY STENT INTERVENTION (PCI-S);  Surgeon: Jolaine Artist, MD;  Location: [facility] CATH LAB;   Service: Cardiovascular;;  OM1  (2.75/79mm Rebel)   Current Outpatient Medications on File Prior to Visit  Medication Sig Dispense Refill  . ALPRAZolam (XANAX) 0.25 MG tablet Take 1 tablet (0.25 mg total) by mouth at bedtime as needed. for sleep 30 tablet 0  . amLODipine (NORVASC) 5 MG tablet TAKE 1 TABLET (5 MG TOTAL) DAILY BY MOUTH. 90 tablet 2  . atorvastatin (LIPITOR) 80 MG tablet TAKE 1 TABLET (80 MG TOTAL) BY MOUTH DAILY AT 6 PM. 90 tablet 2  . calcium carbonate 1250 MG capsule Take 1,250 mg by mouth 2 (two) times daily with a meal.    . cholecalciferol (VITAMIN D) 1000 UNITS tablet Take 1,000 Units by mouth daily.    . clopidogrel (PLAVIX) 75 MG tablet TAKE 1 TABLET BY MOUTH EVERY DAY 90 tablet 2  . cyanocobalamin 1000 MCG tablet Take 100 mcg by mouth daily.    Marland Kitchen levothyroxine (SYNTHROID, LEVOTHROID) 137 MCG tablet TAKE 1 TABLET EVERY DAY 90 tablet 3  . lisinopril (PRINIVIL,ZESTRIL) 5 MG tablet TAKE 1 TABLET BY MOUTH EVERY DAY 30 tablet 9  . metoprolol tartrate (LOPRESSOR) 50 MG tablet TAKE 1 TABLET BY MOUTH TWICE A DAY 180 tablet 1  . NITROSTAT 0.4 MG SL tablet PLACE 1 TAB UNDER THE TONGUE EVERY 5 MIN X 3 DOSES AS NEEDED FOR CHEST PAIN 25 tablet 1  . pantoprazole (PROTONIX) 40 MG tablet TAKE 1 TABLET BY  MOUTH EVERY DAY 90 tablet 2  . raloxifene (EVISTA) 60 MG tablet TAKE 1 TABLET (60 MG TOTAL) BY MOUTH DAILY. 90 tablet 3  . traMADol (ULTRAM) 50 MG tablet Take 1 tablet (50 mg total) by mouth every 8 (eight) hours as needed. 30 tablet 0  . warfarin (COUMADIN) 1 MG tablet TAKE 2-3 TABLETS BY MOUTH DAILY AS DIRECTED BY COUMADIN CLINIC 240 tablet 1   Current Facility-Administered Medications on File Prior to Visit  Medication Dose Route Frequency Provider Last Rate Last Dose  . denosumab (PROLIA) injection 60 mg  60 mg Subcutaneous Q6 months [name], MD   60 mg at 11/18/18 1138    Allergies  Allergen Reactions  . Fosamax [Alendronate Sodium]    Social History    Socioeconomic History  . Marital status: Married    Spouse name: Not on file  . Number of children: Not on file  . Years of education: Not on file  . Highest education level: Not on file  Occupational History  . Not on file  Social Needs  . Financial resource strain: Not on file  . Food insecurity:    Worry: Not on file    Inability: Not on file  . Transportation needs:    Medical: Not on file    Non-medical: Not on file  Tobacco Use  . Smoking status: Former Smoker    Last attempt to quit: 04/28/1995    Years since quitting: 23.8  . Smokeless tobacco: Never Used  Substance and Sexual Activity  . Alcohol use: No    Alcohol/week: 0.0 standard drinks  . Drug use: No  . Sexual activity: Not on file  Lifestyle  . Physical activity:    Days per week: Not on file    Minutes per session: Not on file  . Stress: Not on file  Relationships  . Social connections:    Talks on phone: Not on file    Gets together: Not on file    Attends religious service: Not on file    Active member of club or organization: Not on file    Attends meetings of clubs or organizations: Not on file    Relationship status: Not on file  . Intimate partner violence:    Fear of current or ex partner: Not on file    Emotionally abused: Not on file    Physically abused: Not on file    Forced sexual activity: Not on file  Other Topics Concern  . Not on file  Social History Narrative  . Not on file     Review of Systems  All other systems reviewed and are negative.      Objective:   Physical Exam  Constitutional: She appears well-developed and well-nourished. No distress.  HENT:  Head:    Right Ear: External ear normal.  Left Ear: External ear normal.  Nose: Nose normal.  Mouth/Throat: Oropharynx is clear and moist. No oropharyngeal exudate.  Eyes: Conjunctivae are normal.  Neck: Neck supple. No JVD present. No thyromegaly present.  Cardiovascular: Normal rate. An irregularly irregular  rhythm present.  Murmur heard. Pulmonary/Chest: Effort normal. No respiratory distress. She has no wheezes. She has rhonchi in the right lower field and the left lower field.  Abdominal: Soft. Bowel sounds are normal. She exhibits no distension. There is no abdominal tenderness. There is no rebound and no guarding.  Musculoskeletal:        General: No edema.  Lymphadenopathy:    She has  no cervical adenopathy.  Skin: She is not diaphoretic.  Vitals reviewed.         Assessment & Plan:  I believe the patient has bronchitis particular given the rhonchorous breath sounds in both bases and seems to be getting worse now over a week into the infection.  Therefore I will start her on doxycycline 100 mg p.o. twice daily for 7 days.  This will increase the effect of her Coumadin and therefore I will decrease her Coumadin dose.  She is currently taking 2 tablets every day and 1 tablet on Tuesday and Saturday.  I will change her dose to 2 tablets every day except Tuesday, Thursday, Saturday, and Sunday.  She will take 1 tablet on those days for the duration of the antibiotic.  She will then resume her previous Coumadin dose thereafter.  She can use Mucinex for chest congestion.  I will consult dermatology for the lesion on her forehead that appears to be a skin cancer.

## 2019-02-25 ENCOUNTER — Ambulatory Visit: Payer: Medicare Other | Admitting: Pharmacist

## 2019-02-25 DIAGNOSIS — I4819 Other persistent atrial fibrillation: Secondary | ICD-10-CM

## 2019-02-25 DIAGNOSIS — Z5181 Encounter for therapeutic drug level monitoring: Secondary | ICD-10-CM | POA: Diagnosis not present

## 2019-02-25 LAB — POCT INR: INR: 2.3 (ref 2.0–3.0)

## 2019-02-25 NOTE — Patient Instructions (Signed)
Continue taking 2 tablets everyday except 1 tablet on Tuesdays and Saturdays.   Continue on same amount of SlimFast 4 days a week on Tuesdays, Thursdays, Saturdays and Sundays. Continue eating your 2 servings of leafy green vegetable each week.  Recheck INR in 5 weeks. Call with any questions. [phone]

## 2019-03-03 ENCOUNTER — Other Ambulatory Visit: Payer: Self-pay | Admitting: Internal Medicine

## 2019-03-09 DIAGNOSIS — C44319 Basal cell carcinoma of skin of other parts of face: Secondary | ICD-10-CM | POA: Diagnosis not present

## 2019-03-30 ENCOUNTER — Telehealth: Payer: Self-pay | Admitting: Pharmacist

## 2019-03-30 NOTE — Telephone Encounter (Signed)
1. Do you currently have a fever? no (yes = cancel and refer to pcp for e-visit) 2. Have you recently travelled on a cruise, internationally, or to [place], [place], [place], [place], [place], or [place], [place] [employer]) ? no (yes = cancel, stay home, monitor symptoms, and contact pcp or initiate e-visit if symptoms develop) 3. Have you been in contact with someone that is currently pending confirmation of Covid19 testing or has been confirmed to have the [place] virus?  no (yes = cancel, stay home, away from tested individual, monitor symptoms, and contact pcp or initiate e-visit if symptoms develop) 4. Are you currently experiencing fatigue or cough? no (yes = pt should be prepared to have a mask placed at the time of their visit).  Pt. Advised that we are restricting visitors at this time and anyone present in the vehicle should meet the above criteria as well. Advised that visit will be at curbside for finger stick ONLY and will receive call with instructions. Pt also advised to please bring own pen for signature of arrival document.   Daughter will be brining. Answered no to all questions above

## 2019-04-01 ENCOUNTER — Other Ambulatory Visit: Payer: Self-pay

## 2019-04-01 ENCOUNTER — Ambulatory Visit (INDEPENDENT_AMBULATORY_CARE_PROVIDER_SITE_OTHER): Payer: Medicare Other | Admitting: Pharmacist

## 2019-04-01 DIAGNOSIS — I4819 Other persistent atrial fibrillation: Secondary | ICD-10-CM | POA: Diagnosis not present

## 2019-04-01 DIAGNOSIS — Z5181 Encounter for therapeutic drug level monitoring: Secondary | ICD-10-CM

## 2019-04-01 LAB — POCT INR: INR: 2.8 (ref 2.0–3.0)

## 2019-04-06 ENCOUNTER — Telehealth: Payer: Self-pay

## 2019-04-06 DIAGNOSIS — C44319 Basal cell carcinoma of skin of other parts of face: Secondary | ICD-10-CM | POA: Diagnosis not present

## 2019-04-06 NOTE — Telephone Encounter (Signed)
TELEPHONE CALL NOTE  [name] has been deemed a candidate for a follow-up tele-health visit to limit community exposure during the Covid-19 pandemic. I spoke with the patient via phone to ensure availability of phone/video source, confirm preferred email & phone number, and discuss instructions and expectations.  I reminded [name] to be prepared with any vital sign and/or heart rhythm information that could potentially be obtained via home monitoring, at the time of her visit. I reminded [name] to expect a phone call at the time of her visit if her visit.  Did the patient verbally acknowledge consent to treatment? Received verbal consent from pt's daughter as pt has dementia.  Meryl Crutch, RN 04/06/2019 3:46 PM   DOWNLOADING THE [place], go to [employer] and type in [place] in the search bar. [place] [employer], the blue/green circle. The app is free but as with any other app downloads, their phone may require them to verify saved payment information or Apple password. The patient does NOT have to create an account.  - If Android, ask patient to go to [employer] and type in Web[place] in the search bar. [place] [employer], the blue/green circle. The app is free but as with any other app downloads, their phone may require them to verify saved payment information or Android password. The patient does NOT have to create an account.   CONSENT FOR TELE-HEALTH VISIT - PLEASE REVIEW  I hereby voluntarily request, consent and authorize [facility] and its employed or contracted physicians, physician assistants, nurse practitioners or other licensed health care professionals (the Practitioner), to provide me with telemedicine health care services (the "Services") as deemed necessary by the treating Practitioner. I acknowledge and consent to receive the Services by the Practitioner via telemedicine. I understand that the  telemedicine visit will involve communicating with the Practitioner through live audiovisual communication technology and the disclosure of certain medical information by electronic transmission. I acknowledge that I have been given the opportunity to request an in-person assessment or other available alternative prior to the telemedicine visit and am voluntarily participating in the telemedicine visit.  I understand that I have the right to withhold or withdraw my consent to the use of telemedicine in the course of my care at any time, without affecting my right to future care or treatment, and that the Practitioner or I may terminate the telemedicine visit at any time. I understand that I have the right to inspect all information obtained and/or recorded in the course of the telemedicine visit and may receive copies of available information for a reasonable fee.  I understand that some of the potential risks of receiving the Services via telemedicine include:  Marland Kitchen Delay or interruption in medical evaluation due to technological equipment failure or disruption; . Information transmitted may not be sufficient (e.g. poor resolution of images) to allow for appropriate medical decision making by the Practitioner; and/or  . In rare instances, security protocols could fail, causing a breach of personal health information.  Furthermore, I acknowledge that it is my responsibility to provide information about my medical history, conditions and care that is complete and accurate to the best of my ability. I acknowledge that Practitioner's advice, recommendations, and/or decision may be based on factors not within their control, such as incomplete or inaccurate data provided by me or distortions of diagnostic images or specimens that may result from electronic transmissions. I understand  that the practice of medicine is not an exact science and that Practitioner makes no warranties or guarantees regarding treatment  outcomes. I acknowledge that I will receive a copy of this consent concurrently upon execution via email to the email address I last provided but may also request a printed copy by calling the office of [place].    I understand that my insurance will be billed for this visit.   I have read or had this consent read to me. . I understand the contents of this consent, which adequately explains the benefits and risks of the Services being provided via telemedicine.  . I have been provided ample opportunity to ask questions regarding this consent and the Services and have had my questions answered to my satisfaction. . I give my informed consent for the services to be provided through the use of telemedicine in my medical care  By participating in this telemedicine visit I agree to the above.

## 2019-04-06 NOTE — Telephone Encounter (Signed)
Attempted to contact patient 3x.  Phone call is answered. No response and then phone is hung up.

## 2019-04-09 ENCOUNTER — Telehealth: Payer: Self-pay | Admitting: Internal Medicine

## 2019-04-09 NOTE — Telephone Encounter (Signed)
Home phone/ decline my chart/ pre reg completed

## 2019-04-12 ENCOUNTER — Telehealth (INDEPENDENT_AMBULATORY_CARE_PROVIDER_SITE_OTHER): Payer: Medicare Other | Admitting: Internal Medicine

## 2019-04-12 ENCOUNTER — Encounter: Payer: Self-pay | Admitting: Internal Medicine

## 2019-04-12 ENCOUNTER — Other Ambulatory Visit: Payer: Self-pay

## 2019-04-12 VITALS — Ht <= 58 in | Wt 170.0 lb

## 2019-04-12 DIAGNOSIS — I350 Nonrheumatic aortic (valve) stenosis: Secondary | ICD-10-CM

## 2019-04-12 DIAGNOSIS — I4819 Other persistent atrial fibrillation: Secondary | ICD-10-CM

## 2019-04-12 DIAGNOSIS — I10 Essential (primary) hypertension: Secondary | ICD-10-CM | POA: Diagnosis not present

## 2019-04-12 DIAGNOSIS — E782 Mixed hyperlipidemia: Secondary | ICD-10-CM | POA: Diagnosis not present

## 2019-04-12 DIAGNOSIS — I2510 Atherosclerotic heart disease of native coronary artery without angina pectoris: Secondary | ICD-10-CM | POA: Diagnosis not present

## 2019-04-12 DIAGNOSIS — Z7189 Other specified counseling: Secondary | ICD-10-CM

## 2019-04-12 NOTE — Progress Notes (Signed)
Virtual Visit via Telephone Note   This visit type was conducted due to national recommendations for restrictions regarding the COVID-19 Pandemic (e.g. social distancing) in an effort to limit this patient's exposure and mitigate transmission in our community.  Due to her co-morbid illnesses, this patient is at least at moderate risk for complications without adequate follow up.  This format is felt to be most appropriate for this patient at this time.  The patient did not have access to video technology/had technical difficulties with video requiring transitioning to audio format only (telephone).  All issues noted in this document were discussed and addressed.  No physical exam could be performed with this format.  Please refer to the patient's chart for her  consent to telehealth for [facility].   Evaluation Performed:  Telephone visit for follow-up  Date:  04/12/2019   ID:  [name], DOB August 08, 1933, MRN [id number]  Patient Location:  [place] [address]  Provider location:   [address] [place] [address], [place] [address]  PCP:  [name], MD  Cardiologist:  Pixie Casino, MD Electrophysiologist:  None   Chief Complaint:  No complaints  History of Present Illness:    CHE BELOW is a 85 y.o. female who presents via audio/video conferencing for a telehealth visit today.  [name] is an 84 year old female with history of aortic stenosis, minimally if any symptomatic atrial fibrillation, severe biatrial enlargement, coronary artery disease status post bare-metal stent in 2016, hypertension, dyslipidemia, and mild obstructive sleep apnea.  She presents today via telephone follow-up.  Her daughter was additionally on the [place] to provide corroboratory evidence.  Overall [name] says she feels well.  She denies any chest pain or shortness of breath.  She has no new lower extremity edema.  She has not had any presyncopal or syncopal  episodes.  She denies any symptoms of palpitations.  Unfortunately she did not have a blood pressure cuff and was unable to provide heart rate and blood pressure information today.  Her last echocardiogram was in October 2019 which showed moderate to severe aortic stenosis and a mean gradient of 27 mmHg.  LVEF was normal with severe biatrial enlargement.    The patient does not have symptoms concerning for COVID-19 infection (fever, chills, cough, or new SHORTNESS OF BREATH).    Prior CV studies:   The following studies were reviewed today:  Echocardiogram 09/2018 Recent Labs - including INR  PMHx:  Past Medical History:  Diagnosis Date   A-fib ([place])    a. Dx 01/2015, CHA2DS2VASc = 5-->coumadin started.   CAD (coronary artery disease)    a. 01/2015 NSTEMI/PCI: LM nl, LAD 30p, LCX nondom, 30-40p, 50d, OM1 100p (2.75x16 Rebel BMS), RCA mild diff plaque, EF 55%.   Candida infection, disseminated ([place])    H/O echocardiogram    a. 01/2015 Echo: EF 50-55%, mild LVH, no rwma, mild MR, mildly dil LA, mod TR, PASP 53 mmHg.   Hyperlipidemia    Hypertension    Hypothyroid    Osteoporosis    Sleep apnea 04/2012   Mild/ AHI 10/CPAP 7cm h2o w/2 L o2    Past Surgical History:  Procedure Laterality Date   CARDIOVERSION N/A 08/22/2015   Procedure: CARDIOVERSION;  Surgeon: Skeet Latch, MD;  Location: [facility];  Service: Cardiovascular;  Laterality: N/A;   LEFT HEART CATHETERIZATION WITH CORONARY ANGIOGRAM N/A 02/20/2015   Procedure: LEFT HEART CATHETERIZATION WITH CORONARY ANGIOGRAM;  Surgeon: Shaune Pascal  Bensimhon, MD;  Location: [facility] CATH LAB;  Service: Cardiovascular;  Laterality: N/A;   PERCUTANEOUS CORONARY STENT INTERVENTION (PCI-S)  02/20/2015   Procedure: PERCUTANEOUS CORONARY STENT INTERVENTION (PCI-S);  Surgeon: Jolaine Artist, MD;  Location: [facility] CATH LAB;  Service: Cardiovascular;;  OM1  (2.75/60mm Rebel)    FAMHx:  Family History  Problem Relation Age of Onset    Coronary artery disease Father    Diabetes Father    Stroke Mother    Arthritis Mother     SOCHx:   reports that she quit smoking about 23 years ago. She has never used smokeless tobacco. She reports that she does not drink alcohol or use drugs.  ALLERGIES:  Allergies  Allergen Reactions   Fosamax [Alendronate Sodium]     MEDS:  Current Meds  Medication Sig   ALPRAZolam (XANAX) 0.25 MG tablet Take 1 tablet (0.25 mg total) by mouth at bedtime as needed. for sleep   amLODipine (NORVASC) 5 MG tablet TAKE 1 TABLET (5 MG TOTAL) DAILY BY MOUTH.   atorvastatin (LIPITOR) 80 MG tablet TAKE 1 TABLET (80 MG TOTAL) BY MOUTH DAILY AT 6 PM.   calcium carbonate 1250 MG capsule Take 1,250 mg by mouth 2 (two) times daily with a meal.   cholecalciferol (VITAMIN D) 1000 UNITS tablet Take 1,000 Units by mouth daily.   clopidogrel (PLAVIX) 75 MG tablet TAKE 1 TABLET BY MOUTH EVERY DAY   cyanocobalamin 1000 MCG tablet Take 100 mcg by mouth daily.   doxycycline (VIBRA-TABS) 100 MG tablet Take 1 tablet (100 mg total) by mouth 2 (two) times daily.   levothyroxine (SYNTHROID, LEVOTHROID) 137 MCG tablet TAKE 1 TABLET EVERY DAY   lisinopril (PRINIVIL,ZESTRIL) 5 MG tablet TAKE 1 TABLET BY MOUTH EVERY DAY   metoprolol tartrate (LOPRESSOR) 50 MG tablet TAKE 1 TABLET BY MOUTH TWICE A DAY   NITROSTAT 0.4 MG SL tablet PLACE 1 TAB UNDER THE TONGUE EVERY 5 MIN X 3 DOSES AS NEEDED FOR CHEST PAIN   pantoprazole (PROTONIX) 40 MG tablet TAKE 1 TABLET BY MOUTH EVERY DAY   raloxifene (EVISTA) 60 MG tablet TAKE 1 TABLET (60 MG TOTAL) BY MOUTH DAILY.   traMADol (ULTRAM) 50 MG tablet Take 1 tablet (50 mg total) by mouth every 8 (eight) hours as needed.   warfarin (COUMADIN) 1 MG tablet TAKE 2-3 TABLETS BY MOUTH DAILY AS DIRECTED BY COUMADIN CLINIC   Current Facility-Administered Medications for the 04/12/19 encounter (Telemedicine) with Pixie Casino, MD  Medication   denosumab (PROLIA) injection 60  mg     ROS: Pertinent items noted in HPI and remainder of comprehensive ROS otherwise negative.  Labs/Other Tests and Data Reviewed:    Recent Labs: 09/16/2018: ALT 11; BUN 17; Creat 0.85; Hemoglobin 14.1; Platelets 221; Potassium 4.2; Sodium 140; TSH 1.79   Recent Lipid Panel Lab Results  Component Value Date/Time   CHOL 116 09/30/2017 08:26 AM   TRIG 134 09/30/2017 08:26 AM   HDL 41 (L) 09/30/2017 08:26 AM   CHOLHDL 2.8 09/30/2017 08:26 AM   LDLCALC 53 09/30/2017 08:26 AM    Wt Readings from Last 3 Encounters:  04/12/19 170 lb (77.1 kg)  02/12/19 160 lb (72.6 kg)  10/16/18 168 lb (76.2 kg)     Exam:    Vital Signs:  Ht 4\' 9"  (1.448 m)    Wt 170 lb (77.1 kg)    BMI 36.79 kg/m    No exam due to telephone visit  ASSESSMENT & PLAN:  1. NSTEMI-status post bare metal stent to obtuse marginal (2016) 2. Persistent atrial fibrillation on warfarin -  Failed cardioversion, ?symptomatic - CHADVASC 5 3. Dyslipidemia 4. Hypertension 5. Aortic stenosis 6. Memory loss, question early dementia  I had a discussion today with Ms. Hoganson and her daughter about aortic stenosis.  She was noted to have moderate to severe aortic stenosis by echo in October 2019.  She denies any new or worsening symptoms of shortness of breath or accelerating angina.  She has been therapeutic on warfarin.  Her most recent INR was therapeutic as of the beginning of April.  She is on Plavix as well but no aspirin.  Blood pressure could not be obtained today.  She has had some memory loss and possible early dementia.  She denies any new anginal symptoms.  No medication changes were made today.  We will plan a repeat echo in 6 months to further follow along her aortic stenosis.  Follow-up with me in the office afterwards.  COVID-19 Education: The signs and symptoms of COVID-19 were discussed with the patient and how to seek care for testing (follow up with PCP or arrange E-visit).  The importance of social  distancing was discussed today.  Patient Risk:   After full review of this patients clinical status, I feel that they are at least moderate risk at this time.  Time:   Today, I have spent 25 minutes with the patient with telehealth technology discussing aortic stenosis, memory, atrial fibrillation, general cardiac questions.     Medication Adjustments/Labs and Tests Ordered: Current medicines are reviewed at length with the patient today.  Concerns regarding medicines are outlined above.   Tests Ordered: No orders of the defined types were placed in this encounter.   Medication Changes: No orders of the defined types were placed in this encounter.   Disposition:  in 6 month(s)  Pixie Casino, MD, [facility], [place] Director of the Advanced Lipid Disorders &  Cardiovascular Risk Reduction Clinic Diplomate of the American Board of Clinical Lipidology Attending Cardiologist  Direct Dial: [phone]   Fax: [phone]  Website:  [email or web address]  Pixie Casino, MD  04/12/2019 9:16 AM

## 2019-04-12 NOTE — Patient Instructions (Signed)
Medication Instructions:  Your physician recommends that you continue on your current medications as directed. Please refer to the Current Medication list given to you today.  If you need a refill on your cardiac medications before your next appointment, please call your pharmacy.    Testing/Procedures: Your physician has requested that you have an echocardiogram in 6 months. Echocardiography is a painless test that uses sound waves to create images of your heart. It provides your doctor with information about the size and shape of your heart and how well your heart's chambers and valves are working. This procedure takes approximately one hour. There are no restrictions for this procedure.   Follow-Up: At [facility], you and your health needs are our priority.  As part of our continuing mission to provide you with exceptional heart care, we have created designated Provider Care Teams.  These Care Teams include your primary Cardiologist (physician) and Advanced Practice Providers (APPs -  Physician Assistants and Nurse Practitioners) who all work together to provide you with the care you need, when you need it. You will need a follow up appointment in 6 months.  Please call our office 2 months in advance to schedule this appointment.  You may see Pixie Casino, MD or one of the following Advanced Practice Providers on your designated Care Team: [place], [place] . Fabian Sharp, PA-C  Any Other Special Instructions Will Be Listed Below (If Applicable). None

## 2019-04-13 ENCOUNTER — Ambulatory Visit: Payer: Medicare Other | Admitting: Internal Medicine

## 2019-04-29 ENCOUNTER — Telehealth: Payer: Self-pay | Admitting: Family Medicine

## 2019-04-29 NOTE — Telephone Encounter (Signed)
If leg is swelling woul dneed to be seen to rule out dvt, we could check urinalysis for uti when she comes in.

## 2019-04-29 NOTE — Telephone Encounter (Signed)
Pt's daughter called again stating that pt's left calf is swelling. Looks better this morning then last night. No pain in the leg, just wondering what could be causing this?

## 2019-04-29 NOTE — Telephone Encounter (Signed)
Pt's daughter called and states that the pt's hallucinations have gotten worse over the last several days and was wondering if this could be d/t a UTI? She is not having any symptoms at all for a UTI but she did not the last either and she had one. Would like to know what to do?

## 2019-05-03 NOTE — Telephone Encounter (Signed)
Pt's daughter aware and apt made.

## 2019-05-04 ENCOUNTER — Ambulatory Visit (INDEPENDENT_AMBULATORY_CARE_PROVIDER_SITE_OTHER): Payer: Medicare Other | Admitting: Family Medicine

## 2019-05-04 ENCOUNTER — Other Ambulatory Visit: Payer: Self-pay

## 2019-05-04 ENCOUNTER — Telehealth: Payer: Self-pay | Admitting: *Deleted

## 2019-05-04 VITALS — BP 110/70 | HR 62 | Temp 98.8°F | Resp 16 | Ht <= 58 in | Wt 174.0 lb

## 2019-05-04 DIAGNOSIS — F0391 Unspecified dementia with behavioral disturbance: Secondary | ICD-10-CM | POA: Diagnosis not present

## 2019-05-04 DIAGNOSIS — R4182 Altered mental status, unspecified: Secondary | ICD-10-CM

## 2019-05-04 DIAGNOSIS — C44300 Unspecified malignant neoplasm of skin of unspecified part of face: Secondary | ICD-10-CM

## 2019-05-04 DIAGNOSIS — N3000 Acute cystitis without hematuria: Secondary | ICD-10-CM

## 2019-05-04 LAB — URINALYSIS, ROUTINE W REFLEX MICROSCOPIC
Bacteria, UA: NONE SEEN /HPF
Bilirubin Urine: NEGATIVE
Glucose, UA: NEGATIVE
Hyaline Cast: NONE SEEN /LPF
Ketones, ur: NEGATIVE
Nitrite: NEGATIVE
Protein, ur: NEGATIVE
Specific Gravity, Urine: 1.015 (ref 1.001–1.03)
pH: 7 (ref 5.0–8.0)

## 2019-05-04 LAB — MICROSCOPIC MESSAGE

## 2019-05-04 MED ORDER — CEPHALEXIN 500 MG PO CAPS: 500.0000 mg | ORAL_CAPSULE | Freq: Three times a day (TID) | ORAL | 0 refills | Status: DC

## 2019-05-04 NOTE — Telephone Encounter (Signed)
Pts daughter called to inform us that she is on Keflex for an UTI. Informed her that there is no interaction and there doesn't need to be a dose adjustment continue diet and dose as usual.

## 2019-05-04 NOTE — Progress Notes (Signed)
Subjective:    Patient ID: [name], female    DOB: Feb 18, 1933, 85 y.o.   MRN: [id number]  HPI  02/12/19 Patient presents today with a cough for 1 week.  Cough is productive of yellow sputum.  She also reports increasing chest congestion and some shortness of breath.  She reports subjective fevers.  She denies any hemoptysis.  She denies any purulent sputum.  She denies any chest pain.  She is on Coumadin for atrial fibrillation.  Of note there is a lesion on her forehead that appears to be a skin cancer.  They had deferred treatment for this previously however this lesion seems to be spreading.  I mentioned it again to the patient and her daughter today and they are now willing to see a dermatologist to deal with the issue.  At that time, my plan was: I believe the patient has bronchitis particular given the rhonchorous breath sounds in both bases and seems to be getting worse now over a week into the infection.  Therefore I will start her on doxycycline 100 mg p.o. twice daily for 7 days.  This will increase the effect of her Coumadin and therefore I will decrease her Coumadin dose.  She is currently taking 2 tablets every day and 1 tablet on Tuesday and Saturday.  I will change her dose to 2 tablets every day except Tuesday, Thursday, Saturday, and Sunday.  She will take 1 tablet on those days for the duration of the antibiotic.  She will then resume her previous Coumadin dose thereafter.  She can use Mucinex for chest congestion.  I will consult dermatology for the lesion on her forehead that appears to be a skin cancer.  05/04/19 Patient has seen dermatology and has underwent treatment.  The lesion on her forehead is enlarged today and appears inflamed.  It is roughly 2.5 cm in diameter and appears to be a hyperkeratotic papule.  Patient states that the dermatologist is waiting for this to heal prior to pursuing further treatment.  She presents today with her daughter due to swelling in her left  leg as well as increasing altered mental status.  Regarding the swelling in her left leg, she states this is gradually been worsening over the last week or so.  The circumference of her left calf is 15 inches.  The circumference of her right calf is 13 inches.  There is pitting edema in her left leg.  However if you review previous office visit in October 2019 there were the exact same diameter.  At that time I performed a venous ultrasound to rule out a DVT and there was no evidence of DVT or venous obstruction.  At that time we attributed to lymphedema.  This seems to be the case this time as her INR has been therapeutic every month at the Coumadin clinic of her cardiologist.  Therefore I believe that this is a chronic problem and does not represent an acute DVT.  She has not been wearing compression hose.  Daughter also states that at night, the patient has become increasingly confused over the last week or so.  She is hallucinating more at night.  She is seeing people that are not there or hearing things that are not there.  During the day she does relatively well.  She denies any fevers or chills.  She denies any cough or chest pain.  She denies any nausea vomiting or diarrhea.  She denies any dysuria however urinalysis does shows  trace leukocyte esterase  Past Medical History:  Diagnosis Date  . A-fib ([place])    a. Dx 01/2015, CHA2DS2VASc = 5-->coumadin started.  Marland Kitchen CAD (coronary artery disease)    a. 01/2015 NSTEMI/PCI: LM nl, LAD 30p, LCX nondom, 30-40p, 50d, OM1 100p (2.75x16 Rebel BMS), RCA mild diff plaque, EF 55%.  . Candida infection, disseminated ([place])   . H/O echocardiogram    a. 01/2015 Echo: EF 50-55%, mild LVH, no rwma, mild MR, mildly dil LA, mod TR, PASP 53 mmHg.  Marland Kitchen Hyperlipidemia   . Hypertension   . Hypothyroid   . Osteoporosis   . Sleep apnea 04/2012   Mild/ AHI 10/CPAP 7cm h2o w/2 L o2   Past Surgical History:  Procedure Laterality Date  . CARDIOVERSION N/A 08/22/2015    Procedure: CARDIOVERSION;  Surgeon: Skeet Latch, MD;  Location: [facility];  Service: Cardiovascular;  Laterality: N/A;  . LEFT HEART CATHETERIZATION WITH CORONARY ANGIOGRAM N/A 02/20/2015   Procedure: LEFT HEART CATHETERIZATION WITH CORONARY ANGIOGRAM;  Surgeon: Jolaine Artist, MD;  Location: [facility] CATH LAB;  Service: Cardiovascular;  Laterality: N/A;  . PERCUTANEOUS CORONARY STENT INTERVENTION (PCI-S)  02/20/2015   Procedure: PERCUTANEOUS CORONARY STENT INTERVENTION (PCI-S);  Surgeon: Jolaine Artist, MD;  Location: [facility] CATH LAB;  Service: Cardiovascular;;  OM1  (2.75/48mm Rebel)   Current Outpatient Medications on File Prior to Visit  Medication Sig Dispense Refill  . ALPRAZolam (XANAX) 0.25 MG tablet Take 1 tablet (0.25 mg total) by mouth at bedtime as needed. for sleep 30 tablet 0  . amLODipine (NORVASC) 5 MG tablet TAKE 1 TABLET (5 MG TOTAL) DAILY BY MOUTH. 90 tablet 2  . atorvastatin (LIPITOR) 80 MG tablet TAKE 1 TABLET (80 MG TOTAL) BY MOUTH DAILY AT 6 PM. 90 tablet 2  . calcium carbonate 1250 MG capsule Take 1,250 mg by mouth 2 (two) times daily with a meal.    . cholecalciferol (VITAMIN D) 1000 UNITS tablet Take 1,000 Units by mouth daily.    . clopidogrel (PLAVIX) 75 MG tablet TAKE 1 TABLET BY MOUTH EVERY DAY 90 tablet 2  . cyanocobalamin 1000 MCG tablet Take 100 mcg by mouth daily.    [place] Kitchen doxycycline (VIBRA-TABS) 100 MG tablet Take 1 tablet (100 mg total) by mouth 2 (two) times daily. 14 tablet 0  . levothyroxine (SYNTHROID, LEVOTHROID) 137 MCG tablet TAKE 1 TABLET EVERY DAY 90 tablet 3  . lisinopril (PRINIVIL,ZESTRIL) 5 MG tablet TAKE 1 TABLET BY MOUTH EVERY DAY 30 tablet 9  . metoprolol tartrate (LOPRESSOR) 50 MG tablet TAKE 1 TABLET BY MOUTH TWICE A DAY 180 tablet 1  . NITROSTAT 0.4 MG SL tablet PLACE 1 TAB UNDER THE TONGUE EVERY 5 MIN X 3 DOSES AS NEEDED FOR CHEST PAIN 25 tablet 1  . pantoprazole (PROTONIX) 40 MG tablet TAKE 1 TABLET BY MOUTH EVERY DAY 90 tablet 2  .  raloxifene (EVISTA) 60 MG tablet TAKE 1 TABLET (60 MG TOTAL) BY MOUTH DAILY. 90 tablet 3  . traMADol (ULTRAM) 50 MG tablet Take 1 tablet (50 mg total) by mouth every 8 (eight) hours as needed. 30 tablet 0  . warfarin (COUMADIN) 1 MG tablet TAKE 2-3 TABLETS BY MOUTH DAILY AS DIRECTED BY COUMADIN CLINIC 240 tablet 1   Current Facility-Administered Medications on File Prior to Visit  Medication Dose Route Frequency Provider Last Rate Last Dose  . denosumab (PROLIA) injection 60 mg  60 mg Subcutaneous Q6 months [name], MD   60 mg at 11/18/18 1138  Allergies  Allergen Reactions  . Fosamax [Alendronate Sodium]    Social History   Socioeconomic History  . Marital status: Married    Spouse name: Not on file  . Number of children: Not on file  . Years of education: Not on file  . Highest education level: Not on file  Occupational History  . Not on file  Social Needs  . Financial resource strain: Not on file  . Food insecurity:    Worry: Not on file    Inability: Not on file  . Transportation needs:    Medical: Not on file    Non-medical: Not on file  Tobacco Use  . Smoking status: Former Smoker    Last attempt to quit: 04/28/1995    Years since quitting: 24.0  . Smokeless tobacco: Never Used  Substance and Sexual Activity  . Alcohol use: No    Alcohol/week: 0.0 standard drinks  . Drug use: No  . Sexual activity: Not on file  Lifestyle  . Physical activity:    Days per week: Not on file    Minutes per session: Not on file  . Stress: Not on file  Relationships  . Social connections:    Talks on phone: Not on file    Gets together: Not on file    Attends religious service: Not on file    Active member of club or organization: Not on file    Attends meetings of clubs or organizations: Not on file    Relationship status: Not on file  . Intimate partner violence:    Fear of current or ex partner: Not on file    Emotionally abused: Not on file    Physically  abused: Not on file    Forced sexual activity: Not on file  Other Topics Concern  . Not on file  Social History Narrative  . Not on file     Review of Systems  All other systems reviewed and are negative.      Objective:   Physical Exam  Constitutional: She appears well-developed and well-nourished. No distress.  HENT:  Head:    Right Ear: External ear normal.  Left Ear: External ear normal.  Nose: Nose normal.  Mouth/Throat: Oropharynx is clear and moist. No oropharyngeal exudate.  Eyes: Conjunctivae are normal.  Neck: Neck supple. No JVD present. No thyromegaly present.  Cardiovascular: Normal rate. An irregularly irregular rhythm present.  Murmur heard. Pulmonary/Chest: Effort normal. No respiratory distress. She has no wheezes. She has no rhonchi.  Abdominal: Soft. Bowel sounds are normal.  Musculoskeletal:        General: Edema present.     Right lower leg: No edema.     Left lower leg: Edema present.  Lymphadenopathy:    She has no cervical adenopathy.  Skin: She is not diaphoretic.  Vitals reviewed.         Assessment & Plan:  Altered mental status, unspecified altered mental status type - Plan: Urinalysis, Routine w reflex microscopic, Urine Culture  Skin cancer of face  Dementia with behavioral disturbance, unspecified dementia type ([place])  Acute cystitis without hematuria  Patient presented in October with the exact same presentation in her legs.  Ultrasound was negative at that time for DVT.  Furthermore her INR has been therapeutic between 2 and 3 every month at the Coumadin clinic.  Therefore I do not believe this represents a DVT but rather lymphedema.  I have recommended compression hose be applied to that leg every day  to control the swelling.  Patient is receiving treatment at the dermatologist office regarding skin cancer on her face and I will defer to her dermatologist.  There does appear to be a mild urinary tract infection and will treat that  with Keflex 500 mg 3 times daily for 7 days.  I will send a urine culture.  However I believe that the majority of her confusion at night represents sundowning due to her underlying dementia.  We discussed medication at night for sundowning such as Haldol and at the present time the symptoms are mild and the patient and her daughter declined treatment

## 2019-05-06 LAB — URINE CULTURE
MICRO NUMBER:: 446275
SPECIMEN QUALITY:: ADEQUATE

## 2019-05-09 ENCOUNTER — Other Ambulatory Visit: Payer: Self-pay | Admitting: Internal Medicine

## 2019-05-09 NOTE — Telephone Encounter (Signed)
Lopressor refilled. 

## 2019-05-20 ENCOUNTER — Other Ambulatory Visit: Payer: Self-pay | Admitting: Internal Medicine

## 2019-05-25 ENCOUNTER — Telehealth: Payer: Self-pay | Admitting: Pharmacist

## 2019-05-25 NOTE — Telephone Encounter (Signed)

## 2019-05-26 ENCOUNTER — Other Ambulatory Visit: Payer: Self-pay

## 2019-05-26 ENCOUNTER — Other Ambulatory Visit: Payer: Self-pay | Admitting: Family Medicine

## 2019-05-26 ENCOUNTER — Ambulatory Visit (INDEPENDENT_AMBULATORY_CARE_PROVIDER_SITE_OTHER): Payer: Medicare Other | Admitting: Pharmacist

## 2019-05-26 ENCOUNTER — Ambulatory Visit (INDEPENDENT_AMBULATORY_CARE_PROVIDER_SITE_OTHER): Payer: Medicare Other | Admitting: Family Medicine

## 2019-05-26 DIAGNOSIS — I4819 Other persistent atrial fibrillation: Secondary | ICD-10-CM | POA: Diagnosis not present

## 2019-05-26 DIAGNOSIS — M810 Age-related osteoporosis without current pathological fracture: Secondary | ICD-10-CM

## 2019-05-26 DIAGNOSIS — Z5181 Encounter for therapeutic drug level monitoring: Secondary | ICD-10-CM

## 2019-05-26 LAB — POCT INR: INR: 1.7 — AB (ref 2.0–3.0)

## 2019-05-27 NOTE — Telephone Encounter (Signed)
Requested Prescriptions   Pending Prescriptions Disp Refills  . ALPRAZolam (XANAX) 0.25 MG tablet [Pharmacy Med Name: ALPRAZOLAM 0.25 MG TABLET] 30 tablet     Sig: TAKE 1 TABLET (0.25 MG TOTAL) BY MOUTH AT BEDTIME AS NEEDED. FOR SLEEP   Last OV 05/04/2019 Last written 11/02/2018

## 2019-06-01 DIAGNOSIS — Z85828 Personal history of other malignant neoplasm of skin: Secondary | ICD-10-CM | POA: Diagnosis not present

## 2019-06-01 DIAGNOSIS — Z08 Encounter for follow-up examination after completed treatment for malignant neoplasm: Secondary | ICD-10-CM | POA: Diagnosis not present

## 2019-06-16 ENCOUNTER — Telehealth: Payer: Self-pay

## 2019-06-16 NOTE — Telephone Encounter (Signed)
lmom for prescreen  

## 2019-06-21 NOTE — Telephone Encounter (Signed)

## 2019-06-23 ENCOUNTER — Ambulatory Visit (INDEPENDENT_AMBULATORY_CARE_PROVIDER_SITE_OTHER): Payer: Medicare Other | Admitting: *Deleted

## 2019-06-23 ENCOUNTER — Other Ambulatory Visit: Payer: Self-pay

## 2019-06-23 DIAGNOSIS — I4819 Other persistent atrial fibrillation: Secondary | ICD-10-CM | POA: Diagnosis not present

## 2019-06-23 DIAGNOSIS — Z5181 Encounter for therapeutic drug level monitoring: Secondary | ICD-10-CM | POA: Diagnosis not present

## 2019-06-23 LAB — POCT INR: INR: 1.9 — AB (ref 2.0–3.0)

## 2019-06-23 NOTE — Patient Instructions (Signed)
Description   Today take 2.5 tablets then start taking 2 tablets everyday except 1 tablet on Tuesdays.  Continue on same amount of SlimFast 4 days a week on Tuesdays, Thursdays, Saturdays and Sundays. Continue eating your 2 servings of leafy green vegetable each week.  Recheck INR in 3 weeks. Call with any questions. [phone]

## 2019-07-06 ENCOUNTER — Telehealth: Payer: Self-pay

## 2019-07-06 DIAGNOSIS — C44319 Basal cell carcinoma of skin of other parts of face: Secondary | ICD-10-CM | POA: Diagnosis not present

## 2019-07-06 NOTE — Telephone Encounter (Signed)

## 2019-07-10 ENCOUNTER — Encounter (HOSPITAL_COMMUNITY): Payer: Self-pay | Admitting: *Deleted

## 2019-07-10 ENCOUNTER — Emergency Department (HOSPITAL_COMMUNITY): Payer: Medicare Other

## 2019-07-10 ENCOUNTER — Emergency Department (HOSPITAL_COMMUNITY)
Admission: EM | Admit: 2019-07-10 | Discharge: 2019-07-10 | Disposition: A | Discharge status: Home / Self Care | Payer: Medicare Other | Attending: Emergency Medicine | Admitting: Emergency Medicine

## 2019-07-10 ENCOUNTER — Other Ambulatory Visit: Payer: Self-pay

## 2019-07-10 DIAGNOSIS — S3993XA Unspecified injury of pelvis, initial encounter: Secondary | ICD-10-CM | POA: Diagnosis not present

## 2019-07-10 DIAGNOSIS — Y999 Unspecified external cause status: Secondary | ICD-10-CM | POA: Diagnosis not present

## 2019-07-10 DIAGNOSIS — Y939 Activity, unspecified: Secondary | ICD-10-CM | POA: Insufficient documentation

## 2019-07-10 DIAGNOSIS — Z79899 Other long term (current) drug therapy: Secondary | ICD-10-CM | POA: Diagnosis not present

## 2019-07-10 DIAGNOSIS — S79911A Unspecified injury of right hip, initial encounter: Secondary | ICD-10-CM | POA: Diagnosis not present

## 2019-07-10 DIAGNOSIS — W01190A Fall on same level from slipping, tripping and stumbling with subsequent striking against furniture, initial encounter: Secondary | ICD-10-CM | POA: Insufficient documentation

## 2019-07-10 DIAGNOSIS — Y929 Unspecified place or not applicable: Secondary | ICD-10-CM | POA: Insufficient documentation

## 2019-07-10 DIAGNOSIS — I2510 Atherosclerotic heart disease of native coronary artery without angina pectoris: Secondary | ICD-10-CM | POA: Insufficient documentation

## 2019-07-10 DIAGNOSIS — M25551 Pain in right hip: Secondary | ICD-10-CM | POA: Insufficient documentation

## 2019-07-10 DIAGNOSIS — F0390 Unspecified dementia without behavioral disturbance: Secondary | ICD-10-CM | POA: Insufficient documentation

## 2019-07-10 DIAGNOSIS — M79605 Pain in left leg: Secondary | ICD-10-CM | POA: Diagnosis not present

## 2019-07-10 DIAGNOSIS — Z87891 Personal history of nicotine dependence: Secondary | ICD-10-CM | POA: Diagnosis not present

## 2019-07-10 DIAGNOSIS — Z7901 Long term (current) use of anticoagulants: Secondary | ICD-10-CM | POA: Insufficient documentation

## 2019-07-10 DIAGNOSIS — S0083XA Contusion of other part of head, initial encounter: Secondary | ICD-10-CM | POA: Insufficient documentation

## 2019-07-10 DIAGNOSIS — R51 Headache: Secondary | ICD-10-CM | POA: Diagnosis not present

## 2019-07-10 DIAGNOSIS — S8992XA Unspecified injury of left lower leg, initial encounter: Secondary | ICD-10-CM | POA: Diagnosis not present

## 2019-07-10 DIAGNOSIS — S0990XA Unspecified injury of head, initial encounter: Secondary | ICD-10-CM | POA: Diagnosis not present

## 2019-07-10 DIAGNOSIS — E039 Hypothyroidism, unspecified: Secondary | ICD-10-CM | POA: Diagnosis not present

## 2019-07-10 DIAGNOSIS — W19XXXA Unspecified fall, initial encounter: Secondary | ICD-10-CM

## 2019-07-10 DIAGNOSIS — S199XXA Unspecified injury of neck, initial encounter: Secondary | ICD-10-CM | POA: Diagnosis not present

## 2019-07-10 DIAGNOSIS — S8991XA Unspecified injury of right lower leg, initial encounter: Secondary | ICD-10-CM | POA: Diagnosis not present

## 2019-07-10 DIAGNOSIS — I10 Essential (primary) hypertension: Secondary | ICD-10-CM | POA: Insufficient documentation

## 2019-07-10 DIAGNOSIS — S299XXA Unspecified injury of thorax, initial encounter: Secondary | ICD-10-CM | POA: Diagnosis not present

## 2019-07-10 DIAGNOSIS — I252 Old myocardial infarction: Secondary | ICD-10-CM | POA: Diagnosis not present

## 2019-07-10 DIAGNOSIS — Z7902 Long term (current) use of antithrombotics/antiplatelets: Secondary | ICD-10-CM | POA: Insufficient documentation

## 2019-07-10 DIAGNOSIS — S3991XA Unspecified injury of abdomen, initial encounter: Secondary | ICD-10-CM | POA: Diagnosis not present

## 2019-07-10 LAB — BASIC METABOLIC PANEL
Anion gap: 11 (ref 5–15)
BUN: 16 mg/dL (ref 8–23)
CO2: 22 mmol/L (ref 22–32)
Calcium: 8.8 mg/dL — ABNORMAL LOW (ref 8.9–10.3)
Chloride: 106 mmol/L (ref 98–111)
Creatinine, Ser: 0.94 mg/dL (ref 0.44–1.00)
GFR calc Af Amer: >60 mL/min (ref >60)
GFR calc non Af Amer: 56 mL/min — ABNORMAL LOW (ref >60)
Glucose, Bld: 102 mg/dL — ABNORMAL HIGH (ref 70–99)
Potassium: 3.9 mmol/L (ref 3.5–5.1)
Sodium: 139 mmol/L (ref 135–145)

## 2019-07-10 LAB — CBC WITH DIFFERENTIAL/PLATELET
Abs Immature Granulocytes: 0.02 10*3/uL (ref 0.00–0.07)
Basophils Absolute: 0 10*3/uL (ref 0.0–0.1)
Basophils Relative: 0 %
Eosinophils Absolute: 0.2 10*3/uL (ref 0.0–0.5)
Eosinophils Relative: 2 %
HCT: 40.4 % (ref 36.0–46.0)
Hemoglobin: 12.9 g/dL (ref 12.0–15.0)
Immature Granulocytes: 0 %
Lymphocytes Relative: 18 %
Lymphs Abs: 1.3 10*3/uL (ref 0.7–4.0)
MCH: 28.3 pg (ref 26.0–34.0)
MCHC: 31.9 g/dL (ref 30.0–36.0)
MCV: 88.6 fL (ref 80.0–100.0)
Monocytes Absolute: 0.6 10*3/uL (ref 0.1–1.0)
Monocytes Relative: 9 %
Neutro Abs: 5.2 10*3/uL (ref 1.7–7.7)
Neutrophils Relative %: 71 %
Platelets: 192 10*3/uL (ref 150–400)
RBC: 4.56 MIL/uL (ref 3.87–5.11)
RDW: 13.4 % (ref 11.5–15.5)
WBC: 7.4 10*3/uL (ref 4.0–10.5)
nRBC: 0 % (ref 0.0–0.2)

## 2019-07-10 LAB — PROTIME-INR
INR: 2.2 — ABNORMAL HIGH (ref 0.8–1.2)
Prothrombin Time: 24.5 seconds — ABNORMAL HIGH (ref 11.4–15.2)

## 2019-07-10 MED ORDER — IOHEXOL 300 MG/ML  SOLN
100.0000 mL | Freq: Once | INTRAMUSCULAR | Status: AC | PRN
  Administered 2019-07-10: 100 mL via INTRAVENOUS

## 2019-07-10 NOTE — ED Provider Notes (Signed)
[facility] EMERGENCY DEPARTMENT Provider Note   CSN: [id number] Arrival date & time: 07/10/19  0316     History   Chief Complaint Chief Complaint  Patient presents with   Fall    HPI [name] is a 85 y.o. female.     Level 5 caveat for dementia.  Patient states she got out of bed and attempting to the bathroom and somehow fell onto the ground striking her head on a piece of furniture.  Her daughter heard her cry out and found her laying on the ground.  She was on her back and her right side.  She was noted to have an abrasion hematoma to her forehead.  She does take Coumadin.  She complains of pain to her head as well as right hip and left lower leg.  Patient denies any preceding dizziness or lightheadedness and does not know how she fell.  Her daughter states she does not use a walker when she goes to the bathroom and tries to hold onto the wall.  Patient denies any chest pain or shortness of breath.  She complains of pain to her head and her right hip and her left lower leg.  She does take Coumadin for history of atrial fibrillation.  The history is provided by the patient and a relative. The history is limited by the condition of the patient.    Past Medical History:  Diagnosis Date   A-fib [facility])    a. Dx 01/2015, CHA2DS2VASc = 5-->coumadin started.   CAD (coronary artery disease)    a. 01/2015 NSTEMI/PCI: LM nl, LAD 30p, LCX nondom, 30-40p, 50d, OM1 100p (2.75x16 Rebel BMS), RCA mild diff plaque, EF 55%.   Candida infection, disseminated ([place])    H/O echocardiogram    a. 01/2015 Echo: EF 50-55%, mild LVH, no rwma, mild MR, mildly dil LA, mod TR, PASP 53 mmHg.   Hyperlipidemia    Hypertension    Hypothyroid    Osteoporosis    Sleep apnea 04/2012   Mild/ AHI 10/CPAP 7cm h2o w/2 L o2    Patient Active Problem List   Diagnosis Date Noted   Dementia ([place]) 09/16/2018   Murmur 10/03/2016   Aortic valve stenosis 10/03/2016   Encounter  for therapeutic drug monitoring 02/24/2015   Hypothyroidism 02/21/2015   Persistent atrial fibrillation    CAD (coronary artery disease)    Hypothyroid    Candida infection, disseminated ([place])    Ischemic chest pain ([place])    NSTEMI, initial episode of care [facility])    Hyperlipidemia    Essential hypertension    Osteoporosis    Sleep apnea 04/29/2012    Past Surgical History:  Procedure Laterality Date   CARDIOVERSION N/A 08/22/2015   Procedure: CARDIOVERSION;  Surgeon: Skeet Latch, MD;  Location: [place];  Service: Cardiovascular;  Laterality: N/A;   KNEE SURGERY     LEFT HEART CATHETERIZATION WITH CORONARY ANGIOGRAM N/A 02/20/2015   Procedure: LEFT HEART CATHETERIZATION WITH CORONARY ANGIOGRAM;  Surgeon: Jolaine Artist, MD;  Location: [facility] CATH LAB;  Service: Cardiovascular;  Laterality: N/A;   PERCUTANEOUS CORONARY STENT INTERVENTION (PCI-S)  02/20/2015   Procedure: PERCUTANEOUS CORONARY STENT INTERVENTION (PCI-S);  Surgeon: Jolaine Artist, MD;  Location: [facility] CATH LAB;  Service: Cardiovascular;;  OM1  (2.75/61mm Rebel)     OB History   No obstetric history on file.      Home Medications    Prior to Admission medications   Medication Sig Start Date  End Date Taking? Authorizing Provider  ALPRAZolam (XANAX) 0.25 MG tablet TAKE 1 TABLET (0.25 MG TOTAL) BY MOUTH AT BEDTIME AS NEEDED. FOR SLEEP 05/27/19   [name], MD  amLODipine (NORVASC) 5 MG tablet TAKE 1 TABLET (5 MG TOTAL) DAILY BY MOUTH. 11/20/18   Hilty, Nadean Corwin, MD  atorvastatin (LIPITOR) 80 MG tablet TAKE 1 TABLET (80 MG TOTAL) BY MOUTH DAILY AT 6 PM. 12/21/18   Hilty, Nadean Corwin, MD  calcium carbonate 1250 MG capsule Take 1,250 mg by mouth 2 (two) times daily with a meal.    [provider]  cephALEXin (KEFLEX) 500 MG capsule Take 1 capsule (500 mg total) by mouth 3 (three) times daily. 05/04/19   [name], MD  cholecalciferol (VITAMIN D) 1000 UNITS tablet Take 1,000 Units  by mouth daily.    [provider]  clopidogrel (PLAVIX) 75 MG tablet TAKE 1 TABLET BY MOUTH EVERY DAY 05/20/19   Hilty, Nadean Corwin, MD  cyanocobalamin 1000 MCG tablet Take 100 mcg by mouth daily.    [provider]  doxycycline (VIBRA-TABS) 100 MG tablet Take 1 tablet (100 mg total) by mouth 2 (two) times daily. 02/12/19   [name], MD  levothyroxine (SYNTHROID, LEVOTHROID) 137 MCG tablet TAKE 1 TABLET EVERY DAY 11/10/18   [name], MD  lisinopril (PRINIVIL,ZESTRIL) 5 MG tablet TAKE 1 TABLET BY MOUTH EVERY DAY 11/12/18   Hilty, Nadean Corwin, MD  metoprolol tartrate (LOPRESSOR) 50 MG tablet TAKE 1 TABLET BY MOUTH TWICE A DAY 05/09/19   Hilty, Nadean Corwin, MD  NITROSTAT 0.4 MG SL tablet PLACE 1 TAB UNDER THE TONGUE EVERY 5 MIN X 3 DOSES AS NEEDED FOR CHEST PAIN 09/16/16   Pixie Casino, MD  pantoprazole (PROTONIX) 40 MG tablet TAKE 1 TABLET BY MOUTH EVERY DAY 11/20/18   Hilty, Nadean Corwin, MD  raloxifene (EVISTA) 60 MG tablet TAKE 1 TABLET (60 MG TOTAL) BY MOUTH DAILY. 05/26/18   [name], MD  traMADol (ULTRAM) 50 MG tablet Take 1 tablet (50 mg total) by mouth every 8 (eight) hours as needed. 11/02/18   [name], MD  warfarin (COUMADIN) 1 MG tablet TAKE 2-3 TABLETS BY MOUTH DAILY AS DIRECTED BY COUMADIN CLINIC 03/03/19   Hilty, Nadean Corwin, MD    Family History Family History  Problem Relation Age of Onset   Coronary artery disease Father    Diabetes Father    Stroke Mother    Arthritis Mother     Social History Social History   Tobacco Use   Smoking status: Former Smoker    Quit date: 04/28/1995    Years since quitting: 24.2   Smokeless tobacco: Never Used  Substance Use Topics   Alcohol use: No    Alcohol/week: 0.0 standard drinks   Drug use: No     Allergies   Fosamax [alendronate sodium]   Review of Systems Review of Systems  HENT: Negative for congestion.   Respiratory: Negative for cough, chest tightness and shortness of  breath.   Cardiovascular: Negative for chest pain.  Gastrointestinal: Negative for abdominal pain, nausea and vomiting.  Genitourinary: Negative for dysuria.  Musculoskeletal: Positive for arthralgias, myalgias and neck pain.  Skin: Positive for wound.  Neurological: Positive for headaches. Negative for dizziness and light-headedness.    all other systems are negative except as noted in the HPI and PMH.    Physical Exam Updated Vital Signs BP 139/64 (BP Location: Left Arm)  Pulse 75    Temp 98.1 F (36.7 C) (Oral)    Resp 18    Ht 5' (1.524 m)    Wt 77.1 kg    SpO2 99%    BMI 33.20 kg/m   Physical Exam Vitals signs and nursing note reviewed.  Constitutional:      General: She is not in acute distress.    Appearance: She is well-developed. She is obese.     Comments: Hematoma and abrasion to forehead  HENT:     Head: Normocephalic and atraumatic.     Mouth/Throat:     Pharynx: No oropharyngeal exudate.  Eyes:     Conjunctiva/sclera: Conjunctivae normal.     Pupils: Pupils are equal, round, and reactive to light.  Neck:     Musculoskeletal: Normal range of motion and neck supple.     Comments: No C-spine tenderness or step-off Cardiovascular:     Rate and Rhythm: Normal rate and regular rhythm.     Heart sounds: Normal heart sounds. No murmur.  Pulmonary:     Effort: Pulmonary effort is normal. No respiratory distress.     Breath sounds: Normal breath sounds.  Abdominal:     Palpations: Abdomen is soft.     Tenderness: There is no abdominal tenderness. There is no guarding or rebound.  Musculoskeletal: Normal range of motion.        General: Swelling and deformity present. No tenderness.     Comments: Tenderness to palpation of the left lower leg without obvious deformity.  Full range of motion of bilateral hips without pain. Mild questionable external rotation of right hip.  Skin:    General: Skin is warm.     Capillary Refill: Capillary refill takes less than 2  seconds.  Neurological:     General: No focal deficit present.     Mental Status: She is alert and oriented to person, place, and time. Mental status is at baseline.     Cranial Nerves: No cranial nerve deficit.     Motor: No abnormal muscle tone.     Coordination: Coordination normal.     Comments: No ataxia on finger to nose bilaterally. No pronator drift. 5/5 strength throughout. CN 2-12 intact.Equal grip strength. Sensation intact.   Psychiatric:        Behavior: Behavior normal.      ED Treatments / Results  Labs (all labs ordered are listed, but only abnormal results are displayed) Labs Reviewed  BASIC METABOLIC PANEL - Abnormal; Notable for the following components:      Result Value   Glucose, Bld 102 (*)    Calcium 8.8 (*)    GFR calc non Af Amer 56 (*)    All other components within normal limits  PROTIME-INR - Abnormal; Notable for the following components:   Prothrombin Time 24.5 (*)    INR 2.2 (*)    All other components within normal limits  CBC WITH DIFFERENTIAL/PLATELET    EKG None  Radiology Dg Tibia/fibula Left  Result Date: 07/10/2019 CLINICAL DATA:  States she was getting up to go to the bathroom and fell, c/o knot on her head . States she twisted her left leg. C/o headache Daughter states she hit her head on the bottom of the dresser. Happened approx. 230am EXAM: LEFT TIBIA AND FIBULA - 2 VIEW COMPARISON:  None. FINDINGS: No fracture or bone lesion. Knee prosthetic components appear well seated and well aligned. Ankle joint is normally aligned. Bones are demineralized. Soft tissues are  unremarkable other than arterial vascular calcifications. IMPRESSION: No fracture, dislocation or acute finding. No evidence of disruption of the knee joint prosthetic hardware. Electronically Signed   By: Lajean Manes M.D.   On: 07/10/2019 05:28   Dg Tibia/fibula Right  Result Date: 07/10/2019 CLINICAL DATA:  States she was getting up to go to the bathroom and fell, c/o  knot on her head . States she twisted her left leg. C/o headache Daughter states she hit her head on the bottom of the dresser. Happened approx. 230am EXAM: RIGHT TIBIA AND FIBULA - 2 VIEW COMPARISON:  09/05/2009 FINDINGS: No fracture or bone lesion. Knee joint orthopedic hardware appears well seated and aligned. Ankle joint is grossly normally aligned. Skeletal structures are demineralized. Soft tissues demonstrate arterial vascular calcifications but are otherwise unremarkable. IMPRESSION: 1. No fracture, dislocation or acute finding. No evidence of loosening/disruption of the knee joint prosthesis hardware. Electronically Signed   By: Lajean Manes M.D.   On: 07/10/2019 05:29   Ct Head Wo Contrast  Result Date: 07/10/2019 CLINICAL DATA:  States she was getting up to go to the bathroom and fell, c/o knot on her head . C/o headache Daughter states she hit her head on the bottom of the dresser. EXAM: CT HEAD WITHOUT CONTRAST CT CERVICAL SPINE WITHOUT CONTRAST TECHNIQUE: Multidetector CT imaging of the head and cervical spine was performed following the standard protocol without intravenous contrast. Multiplanar CT image reconstructions of the cervical spine were also generated. COMPARISON:  09/05/2009 FINDINGS: CT HEAD FINDINGS Brain: No evidence of acute infarction, hemorrhage, hydrocephalus, extra-axial collection or mass lesion/mass effect. Patchy areas of white matter hypoattenuation are noted consistent with mild to moderate chronic microvascular ischemic change. Vascular: No hyperdense vessel or unexpected calcification. Skull: Normal. Negative for fracture or focal lesion. Sinuses/Orbits: Globes and orbits are unremarkable. Sinuses and mastoid air cells are clear. Other: None. CT CERVICAL SPINE FINDINGS Alignment: Normal. Skull base and vertebrae: No acute fracture. No primary bone lesion or focal pathologic process. Soft tissues and spinal canal: No prevertebral fluid or swelling. No visible canal  hematoma. No soft tissue masses or adenopathy. Disc levels: Moderate loss of disc height with mild spondylotic disc bulging and endplate spurring at S0-Y3. Remaining cervical discs are well preserved in height. No convincing disc herniation. Upper chest: No acute findings.  Clear lung apices. Other: None. IMPRESSION: HEAD CT 1. No acute intracranial abnormalities. 2. No skull fractures. CERVICAL CT 1. No fracture or acute finding. Electronically Signed   By: Lajean Manes M.D.   On: 07/10/2019 04:59   Ct Cervical Spine Wo Contrast  Result Date: 07/10/2019 CLINICAL DATA:  States she was getting up to go to the bathroom and fell, c/o knot on her head . C/o headache Daughter states she hit her head on the bottom of the dresser. EXAM: CT HEAD WITHOUT CONTRAST CT CERVICAL SPINE WITHOUT CONTRAST TECHNIQUE: Multidetector CT imaging of the head and cervical spine was performed following the standard protocol without intravenous contrast. Multiplanar CT image reconstructions of the cervical spine were also generated. COMPARISON:  09/05/2009 FINDINGS: CT HEAD FINDINGS Brain: No evidence of acute infarction, hemorrhage, hydrocephalus, extra-axial collection or mass lesion/mass effect. Patchy areas of white matter hypoattenuation are noted consistent with mild to moderate chronic microvascular ischemic change. Vascular: No hyperdense vessel or unexpected calcification. Skull: Normal. Negative for fracture or focal lesion. Sinuses/Orbits: Globes and orbits are unremarkable. Sinuses and mastoid air cells are clear. Other: None. CT CERVICAL SPINE FINDINGS Alignment: Normal. Skull base  and vertebrae: No acute fracture. No primary bone lesion or focal pathologic process. Soft tissues and spinal canal: No prevertebral fluid or swelling. No visible canal hematoma. No soft tissue masses or adenopathy. Disc levels: Moderate loss of disc height with mild spondylotic disc bulging and endplate spurring at N2-D7. Remaining cervical  discs are well preserved in height. No convincing disc herniation. Upper chest: No acute findings.  Clear lung apices. Other: None. IMPRESSION: HEAD CT 1. No acute intracranial abnormalities. 2. No skull fractures. CERVICAL CT 1. No fracture or acute finding. Electronically Signed   By: Lajean Manes M.D.   On: 07/10/2019 04:59   Dg Chest Portable 1 View  Result Date: 07/10/2019 CLINICAL DATA:  States she was getting up to go to the bathroom and fell, c/o knot on her head . States she twisted her left leg. C/o headache Daughter states she hit her head on the bottom of the dresser. Happened approx. 230am EXAM: PORTABLE CHEST 1 VIEW COMPARISON:  02/17/2015 FINDINGS: Cardiac silhouette is mildly enlarged. No mediastinal or hilar masses. No evidence of adenopathy. Clear lungs.  No pleural effusion or pneumothorax. Skeletal structures are demineralized. Old healed left lateral rib fractures. No convincing acute fracture. IMPRESSION: No acute cardiopulmonary disease. Electronically Signed   By: Lajean Manes M.D.   On: 07/10/2019 05:25   Dg Hip Unilat W Or Wo Pelvis 2-3 Views Right  Result Date: 07/10/2019 CLINICAL DATA:  States she was getting up to go to the bathroom and fell, c/o knot on her head . States she twisted her left leg. C/o headache Daughter states she hit her head on the bottom of the dresser. Happened approx. 230am EXAM: DG HIP (WITH OR WITHOUT PELVIS) 2-3V RIGHT COMPARISON:  None. FINDINGS: No fracture or bone lesion. Hip joints, SI joints and symphysis pubis are normally aligned. There are dense femoral artery vascular calcifications. Surgical vascular clips are noted in the lower abdomen. IMPRESSION: No fracture or acute finding. Electronically Signed   By: Lajean Manes M.D.   On: 07/10/2019 05:27    Procedures Procedures (including critical care time)  Medications Ordered in ED Medications - No data to display   Initial Impression / Assessment and Plan / ED Course  I have reviewed  the triage vital signs and the nursing notes.  Pertinent labs & imaging results that were available during my care of the patient were reviewed by me and considered in my medical decision making (see chart for details).       Patient with fall while attempting to get out of bed.  History limited by dementia.  Vitals are stable and ABCs are intact.  Patient does have evidence of head trauma and is on Coumadin.  Will obtain imaging.  She also has questionable rotation of the right hip.  CT head and C-spine are negative.  Plain films of her hips and lower extremities are negative also.  Patient able to ambulate with assistance which is her baseline.  On reassessment she is states she is feeling better in her legs but is not having abdominal pain to palpation.  She is uncertain whether she hit this during her fall.  Given her anticoagulation, we will proceed with CT imaging to rule out traumatic pathology.  Care to be transferred at shift change to Dr. Vallery Ridge.  Anticipate discharge home if this study is reassuring. Final Clinical Impressions(s) / ED Diagnoses   Final diagnoses:  None    ED Discharge Orders    None  Ezequiel Essex, MD 07/10/19 0730

## 2019-07-10 NOTE — ED Notes (Addendum)
Pt discharge paperwork reviewed with daughter. Daughter verbalized understanding of discharge paperwork. Pt discharged.

## 2019-07-10 NOTE — ED Notes (Signed)
Patient transported to CT 

## 2019-07-10 NOTE — ED Notes (Signed)
Santiago Glad, daughter can be reached at [phone]

## 2019-07-10 NOTE — ED Triage Notes (Signed)
States she was getting up to go to the bathroom and fell, c/o knot on her head . States she twisted her left leg. C/o headache Daughter states she hit her head on the bottom of the dresser. Happened approx. 230am.

## 2019-07-10 NOTE — Discharge Instructions (Signed)
Apply it well wrapped ice to any sore or bruised areas. Take Tylenol for pain as needed. See your doctor for recheck within the next 3 to 5 days.  Return to the emergency department if concerning or worsening symptoms develop. Follow instructions as outlined in your discharge instructions.

## 2019-07-13 ENCOUNTER — Other Ambulatory Visit: Payer: Self-pay

## 2019-07-13 ENCOUNTER — Encounter: Payer: Self-pay | Admitting: Family Medicine

## 2019-07-13 ENCOUNTER — Ambulatory Visit (INDEPENDENT_AMBULATORY_CARE_PROVIDER_SITE_OTHER): Payer: Medicare Other | Admitting: Family Medicine

## 2019-07-13 ENCOUNTER — Ambulatory Visit (INDEPENDENT_AMBULATORY_CARE_PROVIDER_SITE_OTHER): Payer: Medicare Other | Admitting: *Deleted

## 2019-07-13 VITALS — BP 124/80 | HR 77 | Temp 97.5°F | Resp 18 | Ht 62.0 in | Wt 170.0 lb

## 2019-07-13 DIAGNOSIS — M545 Low back pain, unspecified: Secondary | ICD-10-CM

## 2019-07-13 DIAGNOSIS — S0990XD Unspecified injury of head, subsequent encounter: Secondary | ICD-10-CM

## 2019-07-13 DIAGNOSIS — Z09 Encounter for follow-up examination after completed treatment for conditions other than malignant neoplasm: Secondary | ICD-10-CM

## 2019-07-13 DIAGNOSIS — Z5181 Encounter for therapeutic drug level monitoring: Secondary | ICD-10-CM | POA: Diagnosis not present

## 2019-07-13 DIAGNOSIS — R296 Repeated falls: Secondary | ICD-10-CM | POA: Diagnosis not present

## 2019-07-13 DIAGNOSIS — I4819 Other persistent atrial fibrillation: Secondary | ICD-10-CM | POA: Diagnosis not present

## 2019-07-13 LAB — POCT INR: INR: 2.3 (ref 2.0–3.0)

## 2019-07-13 MED ORDER — LISINOPRIL 5 MG PO TABS: 5.0000 mg | ORAL_TABLET | Freq: Every day | ORAL | 9 refills | Status: DC

## 2019-07-13 NOTE — Patient Instructions (Signed)
Description   Continue to take 2 tablets everyday except 1 tablet on Tuesdays.  Continue on same amount of SlimFast 4 days a week on Tuesdays, Thursdays, Saturdays and Sundays. Continue eating your 2 servings of leafy green vegetable each week.  Recheck INR in 4 weeks. Call with any questions. [phone]

## 2019-07-13 NOTE — Patient Instructions (Signed)
Fall Prevention in the Home, Adult Falls can cause injuries. They can happen to people of all ages. There are many things you can do to make your home safe and to help prevent falls. Ask for help when making these changes, if needed. What actions can I take to prevent falls? General Instructions  Use good lighting in all rooms. Replace any light bulbs that burn out.  Turn on the lights when you go into a dark area. Use night-lights.  Keep items that you use often in easy-to-reach places. Lower the shelves around your home if necessary.  Set up your furniture so you have a clear path. Avoid moving your furniture around.  Do not have throw rugs and other things on the floor that can make you trip.  Avoid walking on wet floors.  If any of your floors are uneven, fix them.  Add color or contrast paint or tape to clearly mark and help you see: ? Any grab bars or handrails. ? First and last steps of stairways. ? Where the edge of each step is.  If you use a stepladder: ? Make sure that it is fully opened. Do not climb a closed stepladder. ? Make sure that both sides of the stepladder are locked into place. ? Ask someone to hold the stepladder for you while you use it.  If there are any pets around you, be aware of where they are. What can I do in the bathroom?      Keep the floor dry. Clean up any water that spills onto the floor as soon as it happens.  Remove soap buildup in the tub or shower regularly.  Use non-skid mats or decals on the floor of the tub or shower.  Attach bath mats securely with double-sided, non-slip rug tape.  If you need to sit down in the shower, use a plastic, non-slip stool.  Install grab bars by the toilet and in the tub and shower. Do not use towel bars as grab bars. What can I do in the bedroom?  Make sure that you have a light by your bed that is easy to reach.  Do not use any sheets or blankets that are too big for your bed. They should not  hang down onto the floor.  Have a firm chair that has side arms. You can use this for support while you get dressed. What can I do in the kitchen?  Clean up any spills right away.  If you need to reach something above you, use a strong step stool that has a grab bar.  Keep electrical cords out of the way.  Do not use floor polish or wax that makes floors slippery. If you must use wax, use non-skid floor wax. What can I do with my stairs?  Do not leave any items on the stairs.  Make sure that you have a light switch at the top of the stairs and the bottom of the stairs. If you do not have them, ask someone to add them for you.  Make sure that there are handrails on both sides of the stairs, and use them. Fix handrails that are broken or loose. Make sure that handrails are as long as the stairways.  Install non-slip stair treads on all stairs in your home.  Avoid having throw rugs at the top or bottom of the stairs. If you do have throw rugs, attach them to the floor with carpet tape.  Choose a carpet that does   not hide the edge of the steps on the stairway.  Check any carpeting to make sure that it is firmly attached to the stairs. Fix any carpet that is loose or worn. What can I do on the outside of my home?  Use bright outdoor lighting.  Regularly fix the edges of walkways and driveways and fix any cracks.  Remove anything that might make you trip as you walk through a door, such as a raised step or threshold.  Trim any bushes or trees on the path to your home.  Regularly check to see if handrails are loose or broken. Make sure that both sides of any steps have handrails.  Install guardrails along the edges of any raised decks and porches.  Clear walking paths of anything that might make someone trip, such as tools or rocks.  Have any leaves, snow, or ice cleared regularly.  Use sand or salt on walking paths during winter.  Clean up any spills in your garage right  away. This includes grease or oil spills. What other actions can I take?  Wear shoes that: ? Have a low heel. Do not wear high heels. ? Have rubber bottoms. ? Are comfortable and fit you well. ? Are closed at the toe. Do not wear open-toe sandals.  Use tools that help you move around (mobility aids) if they are needed. These include: ? Canes. ? Walkers. ? Scooters. ? Crutches.  Review your medicines with your doctor. Some medicines can make you feel dizzy. This can increase your chance of falling. Ask your doctor what other things you can do to help prevent falls. Where to find more information  Centers for Disease Control and Prevention, STEADI: https://garcia.biz/  [employer] on Aging: BrainJudge.co.uk Contact a doctor if:  You are afraid of falling at home.  You feel weak, drowsy, or dizzy at home.  You fall at home. Summary  There are many simple things that you can do to make your home safe and to help prevent falls.  Ways to make your home safe include removing tripping hazards and installing grab bars in the bathroom.  Ask for help when making these changes in your home. This information is not intended to replace advice given to you by your health care provider. Make sure you discuss any questions you have with your health care provider. Document Released: 10/12/2009 Document Revised: 04/08/2019 Document Reviewed: 07/31/2017 Elsevier Patient Education  2020 [place] therapy can help ease sore, stiff, injured, and tight muscles and joints. Heat relaxes your muscles, which may help ease your pain and muscle spasms. Do not use heat therapy unless your doctor tells you to use it. How to use heat therapy There are several different kinds of heat therapy, including:  Moist heat pack.  Hot water bottle.  Electric heating pad.  Heated gel pack.  Heated wrap.  Warm water bath. Your doctor will tell you how to use heat  therapy. In general, you should: 1. Place a towel between your skin and the heat source. 2. Leave the heat on for 20-30 minutes. Your skin may turn pink. 3. Remove the heat if your skin turns bright red. You should remove the heat source if you are unable to feel pain, heat, or cold. You are more likely to get burned if you leave it on the skin for too long. Your doctor may also tell you to take a warm water bath. To do this: 1. Put a  non-slip pad in the bathtub to prevent a fall. 2. Fill the bathtub with warm water. 3. Check the water temperature. 4. Soak in the water for 15-20 minutes, or as told by your doctor. 5. Be careful when you stand up after the bath. You may feel dizzy. 6. Pat yourself dry after the bath. Do not rub your skin to dry it. General recommendations for heat therapy  Do not sleep while using heat therapy. Only use heat therapy while you are awake.  Your skin may turn pink while using heat therapy. Do not use heat therapy if your skin turns red.  Do not use heat therapy if you have a new injury.  High heat or using heat for a long time can cause burns. Be careful not to burn your skin when using heat therapy.  Do not use heat therapy on areas of your skin that are already irritated, such as with a rash or sunburn. Get help if you have:  Blisters, redness, swelling (puffiness), or numbness.  New pain.  Pain that is getting worse. Summary  Heat therapy is the use of heat to help ease sore, stiff, injured, and tight muscles and joints.  There are different types of heat therapy. Your doctor will tell you which one to use.  Only use heat therapy while you are awake.  Watch your skin to make sure you do not get burned while using heat therapy. This information is not intended to replace advice given to you by your health care provider. Make sure you discuss any questions you have with your health care provider. Document Released: 03/09/2012 Document Revised:  02/08/2019 Document Reviewed: 12/27/2017 Elsevier Patient Education  [place].     Acute Back Pain, Adult Acute back pain is sudden and usually short-lived. It is often caused by an injury to the muscles and tissues in the back. The injury may result from:  A muscle or ligament getting overstretched or torn (strained). Ligaments are tissues that connect bones to each other. Lifting something improperly can cause a back strain.  Wear and tear (degeneration) of the spinal disks. Spinal disks are circular tissue that provides cushioning between the bones of the spine (vertebrae).  Twisting motions, such as while playing sports or doing yard work.  A hit to the back.  Arthritis. You may have a physical exam, lab tests, and imaging tests to find the cause of your pain. Acute back pain usually goes away with rest and home care. Follow these instructions at home: Managing pain, stiffness, and swelling  Take over-the-counter and prescription medicines only as told by your health care provider.  Your health care provider may recommend applying ice during the first 24-48 hours after your pain starts. To do this: ? Put ice in a plastic bag. ? Place a towel between your skin and the bag. ? Leave the ice on for 20 minutes, 2-3 times a day.  If directed, apply heat to the affected area as often as told by your health care provider. Use the heat source that your health care provider recommends, such as a moist heat pack or a heating pad. ? Place a towel between your skin and the heat source. ? Leave the heat on for 20-30 minutes. ? Remove the heat if your skin turns bright red. This is especially important if you are unable to feel pain, heat, or cold. You have a greater risk of getting burned. Activity   Do not stay in bed. Staying  in bed for more than 1-2 days can delay your recovery.  Sit up and stand up straight. Avoid leaning forward when you sit, or hunching over when you  stand. ? If you work at a desk, sit close to it so you do not need to lean over. Keep your chin tucked in. Keep your neck drawn back, and keep your elbows bent at a right angle. Your arms should look like the letter "L." ? Sit high and close to the steering wheel when you drive. Add lower back (lumbar) support to your car seat, if needed.  Take short walks on even surfaces as soon as you are able. Try to increase the length of time you walk each day.  Do not sit, drive, or stand in one place for more than 30 minutes at a time. Sitting or standing for long periods of time can put stress on your back.  Do not drive or use heavy machinery while taking prescription pain medicine.  Use proper lifting techniques. When you bend and lift, use positions that put less stress on your back: ? [place] your knees. ? Keep the load close to your body. ? Avoid twisting.  Exercise regularly as told by your health care provider. Exercising helps your back heal faster and helps prevent back injuries by keeping muscles strong and flexible.  Work with a physical therapist to make a safe exercise program, as recommended by your health care provider. Do any exercises as told by your physical therapist. Lifestyle  Maintain a healthy weight. Extra weight puts stress on your back and makes it difficult to have good posture.  Avoid activities or situations that make you feel anxious or stressed. Stress and anxiety increase muscle tension and can make back pain worse. Learn ways to manage anxiety and stress, such as through exercise. General instructions  Sleep on a firm mattress in a comfortable position. Try lying on your side with your knees slightly bent. If you lie on your back, put a pillow under your knees.  Follow your treatment plan as told by your health care provider. This may include: ? Cognitive or behavioral therapy. ? Acupuncture or massage therapy. ? Meditation or yoga. Contact a health care provider  if:  You have pain that is not relieved with rest or medicine.  You have increasing pain going down into your legs or buttocks.  Your pain does not improve after 2 weeks.  You have pain at night.  You lose weight without trying.  You have a fever or chills. Get help right away if:  You develop new bowel or bladder control problems.  You have unusual weakness or numbness in your arms or legs.  You develop nausea or vomiting.  You develop abdominal pain.  You feel faint. Summary  Acute back pain is sudden and usually short-lived.  Use proper lifting techniques. When you bend and lift, use positions that put less stress on your back.  Take over-the-counter and prescription medicines and apply heat or ice as directed by your health care provider. This information is not intended to replace advice given to you by your health care provider. Make sure you discuss any questions you have with your health care provider. Document Released: 12/16/2005 Document Revised: 04/06/2019 Document Reviewed: 07/30/2017 Elsevier Patient Education  2020 [employer].

## 2019-07-13 NOTE — Progress Notes (Signed)
Patient ID: [name], female    DOB: Aug 22, 1933, 85 y.o.   MRN: [id number]  PCP: [name], MD  Chief Complaint  Patient presents with  . Hospitalization Follow-up    fell on 07/11 and hit her head    Subjective:   [name] is a 85 y.o. female, presents to clinic with CC of hospital follow-up after unwitnessed fall where patient was unable to recall fall and it was unwitnessed.  Fall and ER visit was 3 days ago, she was not admitted.  She is here today with her daughter who helps care for her.  Her PCP was unavailable to do the follow-up. ER documentation and all imaging and lab reports were reviewed today with the patient and with her daughter. She has a hematoma which is gradually improving located to the top of her head, the bump is getting smaller.  Patient is having some low back and left hip pain that she states is constant but mild and worse when bending very far forward she is using a walker to get around and she is very active.  She is not having any trouble with her gait or ambulating around her home.   She has some bruising and swelling to her left fourth and fifth fingers.  Otherwise has no other complaints, pain, bruising, swelling.  Her daughter states that she is at her baseline mental status.  She has not been complaining of headaches, confusion, dizziness, visual disturbances.      Patient Active Problem List   Diagnosis Date Noted  . Dementia ([place]) 09/16/2018  . Murmur 10/03/2016  . Aortic valve stenosis 10/03/2016  . Encounter for therapeutic drug monitoring 02/24/2015  . Hypothyroidism 02/21/2015  . Persistent atrial fibrillation   . CAD (coronary artery disease)   . Hypothyroid   . Candida infection, disseminated ([place])   . Ischemic chest pain ([place])   . NSTEMI, initial episode of care [facility])   . Hyperlipidemia   . Essential hypertension   . Osteoporosis   . Sleep apnea 04/29/2012     Prior to Admission medications   Medication Sig  Start Date End Date Taking? Authorizing Provider  ALPRAZolam (XANAX) 0.25 MG tablet TAKE 1 TABLET (0.25 MG TOTAL) BY MOUTH AT BEDTIME AS NEEDED. FOR SLEEP Patient taking differently: Take 0.25 mg by mouth at bedtime as needed for sleep.  05/27/19  Yes [name], MD  amLODipine (NORVASC) 5 MG tablet TAKE 1 TABLET (5 MG TOTAL) DAILY BY MOUTH. 11/20/18  Yes Hilty, Nadean Corwin, MD  atorvastatin (LIPITOR) 80 MG tablet TAKE 1 TABLET (80 MG TOTAL) BY MOUTH DAILY AT 6 PM. 12/21/18  Yes Hilty, Nadean Corwin, MD  calcium carbonate 1250 MG capsule Take 1,250 mg by mouth 2 (two) times daily with a meal.   Yes [provider]  cholecalciferol (VITAMIN D) 1000 UNITS tablet Take 1,000 Units by mouth daily.   Yes [provider]  clopidogrel (PLAVIX) 75 MG tablet TAKE 1 TABLET BY MOUTH EVERY DAY Patient taking differently: Take 75 mg by mouth daily.  05/20/19  Yes Hilty, Nadean Corwin, MD  cyanocobalamin 1000 MCG tablet Take 100 mcg by mouth daily.   Yes [provider]  levothyroxine (SYNTHROID, LEVOTHROID) 137 MCG tablet TAKE 1 TABLET EVERY DAY Patient taking differently: Take 137 mcg by mouth daily before breakfast.  11/10/18  Yes Pickard, Cammie Mcgee, MD  lisinopril (PRINIVIL,ZESTRIL) 5 MG tablet TAKE 1 TABLET BY MOUTH EVERY DAY Patient taking  differently: Take 5 mg by mouth daily.  11/12/18  Yes Hilty, Nadean Corwin, MD  metoprolol tartrate (LOPRESSOR) 50 MG tablet TAKE 1 TABLET BY MOUTH TWICE A DAY 05/09/19  Yes Hilty, Nadean Corwin, MD  pantoprazole (PROTONIX) 40 MG tablet TAKE 1 TABLET BY MOUTH EVERY DAY Patient taking differently: Take 40 mg by mouth daily.  11/20/18  Yes Hilty, Nadean Corwin, MD  traMADol (ULTRAM) 50 MG tablet Take 1 tablet (50 mg total) by mouth every 8 (eight) hours as needed. Patient taking differently: Take 50 mg by mouth every 8 (eight) hours as needed for moderate pain.  11/02/18  Yes [name], MD  warfarin (COUMADIN) 1 MG tablet TAKE 2-3 TABLETS BY MOUTH DAILY AS  DIRECTED BY COUMADIN CLINIC Patient taking differently: Take 1-2 mg by mouth See admin instructions. Take 1mg  by mouth on TUES and 2mg  on all other days. 03/03/19  Yes Hilty, Nadean Corwin, MD     Allergies  Allergen Reactions  . Fosamax [Alendronate Sodium] Other (See Comments)    Unknown per daughter     Family History  Problem Relation Age of Onset  . Coronary artery disease Father   . Diabetes Father   . Stroke Mother   . Arthritis Mother      Social History   Socioeconomic History  . Marital status: Married    Spouse name: Not on file  . Number of children: Not on file  . Years of education: Not on file  . Highest education level: Not on file  Occupational History  . Not on file  Social Needs  . Financial resource strain: Not on file  . Food insecurity    Worry: Not on file    Inability: Not on file  . Transportation needs    Medical: Not on file    Non-medical: Not on file  Tobacco Use  . Smoking status: Former Smoker    Quit date: 04/28/1995    Years since quitting: 24.2  . Smokeless tobacco: Never Used  Substance and Sexual Activity  . Alcohol use: No    Alcohol/week: 0.0 standard drinks  . Drug use: No  . Sexual activity: Not on file  Lifestyle  . Physical activity    Days per week: Not on file    Minutes per session: Not on file  . Stress: Not on file  Relationships  . Social Herbalist on phone: Not on file    Gets together: Not on file    Attends religious service: Not on file    Active member of club or organization: Not on file    Attends meetings of clubs or organizations: Not on file    Relationship status: Not on file  . Intimate partner violence    Fear of current or ex partner: Not on file    Emotionally abused: Not on file    Physically abused: Not on file    Forced sexual activity: Not on file  Other Topics Concern  . Not on file  Social History Narrative  . Not on file     Review of Systems  Constitutional: Negative.    HENT: Negative.   Eyes: Negative.   Respiratory: Negative.   Cardiovascular: Negative.   Gastrointestinal: Negative.   Endocrine: Negative.   Genitourinary: Negative.   Musculoskeletal: Negative.   Skin: Negative.   Allergic/Immunologic: Negative.   Neurological: Negative.   Hematological: Negative.   Psychiatric/Behavioral: Negative.   All other systems reviewed and are  negative.      Objective:    Vitals:   07/13/19 0859  BP: 124/80  Pulse: 77  Resp: 18  Temp: (!) 97.5 F (36.4 C)  SpO2: 91%  Weight: 170 lb (77.1 kg)  Height: 5\' 2"  (1.575 m)      Physical Exam Vitals signs and nursing note reviewed.  Constitutional:      General: She is not in acute distress.    Appearance: Normal appearance. She is well-developed. She is not ill-appearing, toxic-appearing or diaphoretic.     Comments: Elderly female, alert, well appearing, pleasant, sitting in exam room, has rolling walker with her.  HENT:     Head: Normocephalic and atraumatic. No raccoon eyes, Battle's sign, abrasion, contusion, right periorbital erythema or left periorbital erythema.     Jaw: There is normal jaw occlusion.     Comments: Hematoma to central top of skull roughly 8x8cm    Nose: Nose normal.  Eyes:     General:        Right eye: No discharge.        Left eye: No discharge.     Conjunctiva/sclera: Conjunctivae normal.  Neck:     Musculoskeletal: Normal range of motion and neck supple.     Trachea: Phonation normal. No tracheal deviation.  Cardiovascular:     Rate and Rhythm: Normal rate and regular rhythm.  Pulmonary:     Effort: Pulmonary effort is normal. No respiratory distress.     Breath sounds: No stridor.  Musculoskeletal:     Right knee: Normal.     Left knee: Normal.     Right ankle: Normal.     Left ankle: Normal.     Cervical back: Normal.     Thoracic back: Normal. She exhibits normal range of motion, no tenderness and no bony tenderness.     Lumbar back: She exhibits  tenderness and bony tenderness. She exhibits normal range of motion, no swelling, no edema, no deformity, no pain, no spasm and normal pulse.       Back:     Right lower leg: Normal.     Left lower leg: Normal.     Comments: Right 4th and 5th digits swollen and bruised with normal ROM  Skin:    General: Skin is warm and dry.     Findings: No rash.  Neurological:     Mental Status: She is alert.     Motor: No abnormal muscle tone.     Coordination: Coordination normal.  Psychiatric:        Behavior: Behavior normal.           Assessment & Plan:      ICD-10-CM   1. Closed head injury, subsequent encounter  S09.90XD   2. Acute left-sided low back pain without sciatica  M54.5   3. Encounter for examination following treatment at hospital  Z09   4. Unwitnessed fall  R29.6 Urinalysis, Routine w reflex microscopic    Unwitnessed fall and ER visit 3 days ago, lab work and imaging was unremarkable, including head neck and abdominal pelvis CT scans and plain film imaging of knees, bilateral tib-fib, left unilateral hip, and portable CXR.  Her hematoma has been gradually improving, no symptoms concerning for concussion, her daughter is here with her she does have dementia and is at her baseline mental status.  She has a little bit of soreness to her low back left SI joint and left hip but is ambulating without difficulty.  Expect  that she will continue to gradually improve and she was encouraged to avoid immobility, do gentle stretching, heat therapy and take Tylenol as needed, and follow-up if having any worsening.  All ER documents labs and imaging were reviewed today.    Delsa Grana, PA-C 07/13/19 9:16 AM

## 2019-08-03 ENCOUNTER — Other Ambulatory Visit: Payer: Self-pay | Admitting: Family Medicine

## 2019-08-03 DIAGNOSIS — Z1231 Encounter for screening mammogram for malignant neoplasm of breast: Secondary | ICD-10-CM

## 2019-08-09 ENCOUNTER — Other Ambulatory Visit: Payer: Self-pay | Admitting: Internal Medicine

## 2019-08-10 ENCOUNTER — Ambulatory Visit (INDEPENDENT_AMBULATORY_CARE_PROVIDER_SITE_OTHER): Payer: Medicare Other | Admitting: *Deleted

## 2019-08-10 ENCOUNTER — Other Ambulatory Visit: Payer: Self-pay | Admitting: Family Medicine

## 2019-08-10 ENCOUNTER — Other Ambulatory Visit: Payer: Self-pay

## 2019-08-10 DIAGNOSIS — Z5181 Encounter for therapeutic drug level monitoring: Secondary | ICD-10-CM | POA: Diagnosis not present

## 2019-08-10 DIAGNOSIS — I4819 Other persistent atrial fibrillation: Secondary | ICD-10-CM

## 2019-08-10 LAB — POCT INR: INR: 2.4 (ref 2.0–3.0)

## 2019-08-10 NOTE — Telephone Encounter (Signed)
Pt is requesting refill on Xanax   LOV: 05/04/19  LRF:   05/27/19

## 2019-08-10 NOTE — Patient Instructions (Signed)
Description   Continue to take 2 tablets everyday except 1 tablet on Tuesdays.  Continue on same amount of SlimFast 4 days a week on Tuesdays, Thursdays, Saturdays and Sundays. Continue eating your 2 servings of leafy green vegetable each week.  Recheck INR in 5 weeks. Call with any questions. [phone]

## 2019-09-07 ENCOUNTER — Telehealth: Payer: Self-pay | Admitting: Family Medicine

## 2019-09-07 NOTE — Telephone Encounter (Signed)
FL2 form placed on your desk for Mrs. Pearlie Oyster

## 2019-09-07 NOTE — Telephone Encounter (Signed)
Do we have any FL 2 forms I can complete.  Otherwise he will have to get a form.

## 2019-09-07 NOTE — Telephone Encounter (Signed)
Patient's son called in stating that he needs an FL2 form filled out for his mom as he would like to place her in a nursing home. He states that her dementia is getting worse and his sister no longer wants to stay with her. Please advise?

## 2019-09-13 ENCOUNTER — Other Ambulatory Visit: Payer: Self-pay | Admitting: Internal Medicine

## 2019-09-14 ENCOUNTER — Other Ambulatory Visit: Payer: Self-pay

## 2019-09-14 ENCOUNTER — Ambulatory Visit (INDEPENDENT_AMBULATORY_CARE_PROVIDER_SITE_OTHER): Payer: Medicare Other | Admitting: *Deleted

## 2019-09-14 DIAGNOSIS — Z5181 Encounter for therapeutic drug level monitoring: Secondary | ICD-10-CM

## 2019-09-14 DIAGNOSIS — I4819 Other persistent atrial fibrillation: Secondary | ICD-10-CM

## 2019-09-14 LAB — POCT INR: INR: 2.2 (ref 2.0–3.0)

## 2019-09-14 NOTE — Patient Instructions (Signed)
Description   Continue to take 2 tablets everyday except 1 tablet on Tuesdays.  Continue on same amount of SlimFast 4 days a week on Tuesdays, Thursdays, Saturdays and Sundays. Continue eating your 2 servings of leafy green vegetable each week.  Recheck INR in 6 weeks. Call with any questions. [phone]

## 2019-09-21 ENCOUNTER — Ambulatory Visit
Admission: RE | Admit: 2019-09-21 | Discharge: 2019-09-21 | Disposition: A | Discharge status: Home / Self Care | Payer: Medicare Other | Source: Ambulatory Visit | Attending: Family Medicine | Admitting: Family Medicine

## 2019-09-21 ENCOUNTER — Other Ambulatory Visit: Payer: Self-pay

## 2019-09-21 DIAGNOSIS — Z1231 Encounter for screening mammogram for malignant neoplasm of breast: Secondary | ICD-10-CM

## 2019-10-04 ENCOUNTER — Ambulatory Visit (HOSPITAL_COMMUNITY): Payer: Medicare Other | Attending: Cardiology

## 2019-10-04 ENCOUNTER — Other Ambulatory Visit: Payer: Self-pay

## 2019-10-04 DIAGNOSIS — I350 Nonrheumatic aortic (valve) stenosis: Secondary | ICD-10-CM | POA: Insufficient documentation

## 2019-10-10 ENCOUNTER — Other Ambulatory Visit: Payer: Self-pay | Admitting: Family Medicine

## 2019-10-10 ENCOUNTER — Other Ambulatory Visit: Payer: Self-pay | Admitting: Internal Medicine

## 2019-10-11 NOTE — Telephone Encounter (Signed)
Last refilled: 08/10/2019 Last office visit: 07/13/2019

## 2019-10-13 ENCOUNTER — Telehealth: Payer: Self-pay | Admitting: Internal Medicine

## 2019-10-13 NOTE — Telephone Encounter (Signed)
New Message:   Pt is scheduled to see Dr Debara Pickett on Monday(10-18-19). Her question is can she and her brother go in with her for her appt? They are supposed to be discussing pt having surgery and both of them wants to be in on this.

## 2019-10-13 NOTE — Telephone Encounter (Signed)
Left detailed message that only 1 visitor allowed per pt appt. CB if any further questions

## 2019-10-18 ENCOUNTER — Ambulatory Visit: Payer: Medicare Other | Admitting: Internal Medicine

## 2019-10-18 ENCOUNTER — Other Ambulatory Visit: Payer: Self-pay

## 2019-10-18 ENCOUNTER — Encounter: Payer: Self-pay | Admitting: Internal Medicine

## 2019-10-18 VITALS — BP 119/66 | HR 80 | Temp 97.9°F | Ht 62.0 in | Wt 175.0 lb

## 2019-10-18 DIAGNOSIS — I4819 Other persistent atrial fibrillation: Secondary | ICD-10-CM | POA: Diagnosis not present

## 2019-10-18 DIAGNOSIS — I10 Essential (primary) hypertension: Secondary | ICD-10-CM | POA: Diagnosis not present

## 2019-10-18 DIAGNOSIS — I350 Nonrheumatic aortic (valve) stenosis: Secondary | ICD-10-CM | POA: Diagnosis not present

## 2019-10-18 DIAGNOSIS — E782 Mixed hyperlipidemia: Secondary | ICD-10-CM

## 2019-10-18 NOTE — Progress Notes (Signed)
OFFICE NOTE  Chief Complaint:  Follow-up a-fib, aortic stenosis  Primary Care Physician: [name], MD  HPI:  [name] is an 85 yo female with a prior history of hypertension, hyperlipidemia, hypothyroidism, sleep apnea, and osteoporosis in the setting of chronic steroid therapy. She was recently admitted to [facility] with severe chest and back discomfort. EKG showed rate-controlled atrial fibrillation which was a new diagnosis. She ruled in for MI. Echo showed normal LV function. Cath revealed an occluded obtuse marginal branch of the left circumflex with other wise nonobstructive disease and normal LV function. The obtuse marginal was successfully stented using a 2.75 x 16 mm Rebel BMS. She did well postprocedure. With new onset A. fib, she was placed on Coumadin with a plan to continue aspirin, Plavix, and warfarin for 1 month and then to discontinue aspirin at that time.  Since discharge, she has done well. She has not had any chest pain or dyspnea. She says that she has been taking it fairly easy because she was.  She denies chest pain, palpitations, dyspnea, pnd, orthopnea, n, v, dizziness, syncope, edema, weight gain, or early satiety.  Right groin catheterization site has been healing well. She was noted to have significant tibial infection with sores under both breasts. She was given Diflucan in the hospital but no further treatment was initiated. She also had new onset atrial fibrillation and has been on warfarin. There've been no attempts at cardioversion.  [name] returns today for follow-up. She is in persistent A. fib. She's currently on Plavix and warfarin. I reviewed her INRs which are been followed at [facility] and they indicate that she's been subtherapeutic several times. We cannot continue with cardioversion at this time. I've asked Erasmo Downer, our anticoagulation pharmacist at [facility] to possibly follow her for the next month to try to get her INR  therapeutic. She continues to feel somewhat fatigued and may benefit from cardioversion.   I head pleasure seeing [name] back in the office today. She recently underwent elective cardioversion by my partner Dr. Oval Linsey. Unfortunately this was unsuccessful and she remains in atrial fibrillation. She says however she does feel stronger and she is asymptomatic at this time. She's had resolution of her candidal infection under her breasts with a treatment of a topical anti-fungal. INR is mildly supratherapeutic.  10/03/2016  [name] returns today for follow-up. Recently she had an upper respiratory infection and blood pressure was noted to be elevated. This is mostly resolved however blood pressure still is elevated and heart rate is in the upper 80s. I cut back her beta blocker in the past however it looks like she may need to be on the full dose now. In addition she has allowed her aortic stenosis murmur today. Her last echo was in February 2016. She reports some fatigue however is basically inactive at home. I have encouraged exercise.  10/08/2017  [name] was seen today in follow-up. She recent saw primary care provider and he felt that she was doing pretty well. She's had some memory loss. Overall though she denied any chest pain or worsening shortness of breath. She has long-standing persistent not permanent A. fib. Her INRs have been stable. She has a history of sleep apnea on CPAP. Blood pressure is well controlled. Recently a lipid profile showed LDL-C at goal of 45. She also has a history of aortic stenosis. Her last echo in 2017 showed progression of her aortic valve disease to moderate  aortic stenosis. She reports no symptoms associated with this. She uses a walker but denies any recent falls at home.  10/08/2018  [name] returns today for follow-up.  He remains in A. fib at 84 and has had fairly stable INR.  Recently 2.6 on October 1.  She had labs as well which showed total  cholesterol 116, HDL 41, LDL 53 and triglycerides 134.  Blood pressure is actually somewhat low today at 108/70.  Reports some fatigue although has no chest pain or shortness of breath.  She is known to have moderate aortic stenosis her last echo in October 2018 with EF of 60 to 65%.  10/18/2019  [name] returns today with her sister.  She reports that recently she has had more fatigue and shortness of breath with exertion.  Repeat echo to look for severe aortic stenosis was performed on October 04, 2019.  LVEF was 65% with mild LVH.  There is mild to moderately enlarged left atrial size and she has been noted to be in A. fib.  Her aortic valve is now severely stenotic with a mean gradient of 40.5 mmHg and peak gradient of 60.2 mmHg.  Measures 0.69 cm.  I discussed the findings with her and her sister today and she was upset.  I do think her reaction was a little over exaggerated may be due to her progressive dementia.  According to her daughter this has become more significant.  In fact recently there were some notes in the chart saying that she may need placement however according to the daughter today she said that was not the case.   PMHx:  Past Medical History:  Diagnosis Date   A-fib [facility])    a. Dx 01/2015, CHA2DS2VASc = 5-->coumadin started.   CAD (coronary artery disease)    a. 01/2015 NSTEMI/PCI: LM nl, LAD 30p, LCX nondom, 30-40p, 50d, OM1 100p (2.75x16 Rebel BMS), RCA mild diff plaque, EF 55%.   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 01/2015 Echo: EF 50-55%, mild LVH, no rwma, mild MR, mildly dil LA, mod TR, PASP 53 mmHg.   Hyperlipidemia    Hypertension    Hypothyroid    Osteoporosis    Sleep apnea 04/2012   Mild/ AHI 10/CPAP 7cm h2o w/2 L o2    Past Surgical History:  Procedure Laterality Date   CARDIOVERSION N/A 08/22/2015   Procedure: CARDIOVERSION;  Surgeon: Skeet Latch, MD;  Location: [place];  Service: Cardiovascular;  Laterality:  N/A;   KNEE SURGERY     LEFT HEART CATHETERIZATION WITH CORONARY ANGIOGRAM N/A 02/20/2015   Procedure: LEFT HEART CATHETERIZATION WITH CORONARY ANGIOGRAM;  Surgeon: Jolaine Artist, MD;  Location: [facility] CATH LAB;  Service: Cardiovascular;  Laterality: N/A;   PERCUTANEOUS CORONARY STENT INTERVENTION (PCI-S)  02/20/2015   Procedure: PERCUTANEOUS CORONARY STENT INTERVENTION (PCI-S);  Surgeon: Jolaine Artist, MD;  Location: [facility] CATH LAB;  Service: Cardiovascular;;  OM1  (2.75/33mm Rebel)    FAMHx:  Family History  Problem Relation Age of Onset   Coronary artery disease Father    Diabetes Father    Stroke Mother    Arthritis Mother     SOCHx:   reports that she quit smoking about 24 years ago. She has never used smokeless tobacco. She reports that she does not drink alcohol or use drugs.  ALLERGIES:  Allergies  Allergen Reactions   Fosamax [Alendronate Sodium] Other (See Comments)    Unknown per daughter    ROS:  Pertinent items noted in HPI and remainder of comprehensive ROS otherwise negative.  HOME MEDS: Current Outpatient Medications  Medication Sig Dispense Refill   ALPRAZolam (XANAX) 0.25 MG tablet TAKE 1 TABLET BY MOUTH EVERY DAY AT BEDTIME AS NEEDED FOR SLEEP 30 tablet 1   amLODipine (NORVASC) 5 MG tablet TAKE 1 TABLET (5 MG TOTAL) DAILY BY MOUTH. 90 tablet 1   atorvastatin (LIPITOR) 80 MG tablet TAKE 1 TABLET BY MOUTH DAILY X 6PM 90 tablet 2   calcium carbonate 1250 MG capsule Take 1,250 mg by mouth 2 (two) times daily with a meal.     cholecalciferol (VITAMIN D) 1000 UNITS tablet Take 1,000 Units by mouth daily.     clopidogrel (PLAVIX) 75 MG tablet TAKE 1 TABLET BY MOUTH EVERY DAY (Patient taking differently: Take 75 mg by mouth daily. ) 90 tablet 2   cyanocobalamin 1000 MCG tablet Take 100 mcg by mouth daily.     levothyroxine (SYNTHROID) 137 MCG tablet TAKE 1 TABLET EVERY DAY 30 tablet 0   lisinopril (ZESTRIL) 5 MG tablet Take 1 tablet (5 mg total) by  mouth daily. 30 tablet 9   metoprolol tartrate (LOPRESSOR) 50 MG tablet TAKE 1 TABLET BY MOUTH TWICE A DAY 180 tablet 2   pantoprazole (PROTONIX) 40 MG tablet TAKE 1 TABLET BY MOUTH EVERY DAY 90 tablet 2   traMADol (ULTRAM) 50 MG tablet Take 1 tablet (50 mg total) by mouth every 8 (eight) hours as needed. (Patient taking differently: Take 50 mg by mouth every 8 (eight) hours as needed for moderate pain. ) 30 tablet 0   warfarin (COUMADIN) 1 MG tablet TAKE 2-3 TABLETS BY MOUTH DAILY AS DIRECTED BY COUMADIN CLINIC (Patient taking differently: Take 1-2 mg by mouth See admin instructions. Take 1mg  by mouth on TUES and 2mg  on all other days.) 240 tablet 1   Current Facility-Administered Medications  Medication Dose Route Frequency Provider Last Rate Last Dose   denosumab (PROLIA) injection 60 mg  60 mg Subcutaneous Q6 months Jenna Luo T, MD   60 mg at 05/26/19 1055    LABS/IMAGING: No results found for this or any previous visit (from the past 48 hour(s)). No results found.  WEIGHTS: Wt Readings from Last 3 Encounters:  10/18/19 175 lb (79.4 kg)  07/13/19 170 lb (77.1 kg)  07/10/19 170 lb (77.1 kg)    VITALS: BP 119/66    Pulse 80    Temp 97.9 F (36.6 C)    Ht 5\' 2"  (1.575 m)    Wt 175 lb (79.4 kg)    SpO2 96%    BMI 32.01 kg/m   EXAM: General appearance: alert and no distress Neck: no carotid bruit, no JVD and thyroid not enlarged, symmetric, no tenderness/mass/nodules Lungs: clear to auscultation bilaterally Heart: regular rate and rhythm, S1, S2 normal and systolic murmur: late systolic 3/6, crescendo at 2nd right intercostal space Abdomen: soft, non-tender; bowel sounds normal; no masses,  no organomegaly Extremities: extremities normal, atraumatic, no cyanosis or edema Pulses: 2+ and symmetric Skin: Skin color, texture, turgor normal. No rashes or lesions Neurologic: Grossly normal Psych: Pleasant  EKG: A. fib at 81, low voltage QRS-personally  reviewed  ASSESSMENT: 1. NSTEMI-status post bare metal stent to obtuse marginal 2. Persistent atrial fibrillation on warfarin -  Failed cardioversion, ?symptomatic - CHADVASC 5 3. Dyslipidemia 4. Hypertension 5. Severe symptomatic aortic stenosis 6. Memory loss, question early dementia  PLAN: 1.   [name] has had worsening fatigue and shortness of  breath and now has severe symptomatic aortic stenosis with mean gradient just about 40 mmHg and calculated valve area less than 0.7 cm.  From a technical standpoint I think she will qualify for aortic valve replacement, however I am concerned about recent memory loss and progression of dementia as to whether she would be a candidate for valve replacement based on these findings.  Nonetheless I would recommend that she is evaluated in the multidisciplinary valve clinic.  She and her sister are agreeable to this.  Follow-up in 6 months or sooner as necessary.  Pixie Casino, MD, [facility] [place] Director of the Advanced Lipid Disorders &  Cardiovascular Risk Reduction Clinic Diplomate of the American Board of Clinical Lipidology Attending Cardiologist  Direct Dial: [phone]   Fax: [phone]  Website:  [email or web address] 10/18/2019, 3:35 PM

## 2019-10-18 NOTE — Patient Instructions (Addendum)
Medication Instructions:  Your physician recommends that you continue on your current medications as directed. Please refer to the Current Medication list given to you today.  *If you need a refill on your cardiac medications before your next appointment, please call your pharmacy*   Follow-Up: At [facility], you and your health needs are our priority.  As part of our continuing mission to provide you with exceptional heart care, we have created designated Provider Care Teams.  These Care Teams include your primary Cardiologist (physician) and Advanced Practice Providers (APPs -  Physician Assistants and Nurse Practitioners) who all work together to provide you with the care you need, when you need it.  Your next appointment:   6 months  The format for your next appointment:   In Person  Provider:   You may see Pixie Casino, MD or one of the following Advanced Practice Providers on your designated Care Team:    Almyra Deforest, PA-C  Fabian Sharp, PA-C or   Roby Lofts, [place]   Other Instructions  You have been referred to [facility] to talk about your aortic valve. Someone will contact you about this appointment.

## 2019-10-21 ENCOUNTER — Telehealth: Payer: Self-pay

## 2019-10-21 ENCOUNTER — Ambulatory Visit: Payer: Medicare Other | Admitting: Cardiovascular Disease

## 2019-10-21 ENCOUNTER — Encounter: Payer: Self-pay | Admitting: *Deleted

## 2019-10-21 ENCOUNTER — Other Ambulatory Visit: Payer: Self-pay

## 2019-10-21 ENCOUNTER — Encounter: Payer: Self-pay | Admitting: Cardiovascular Disease

## 2019-10-21 VITALS — BP 118/72 | HR 71 | Ht 62.0 in | Wt 173.8 lb

## 2019-10-21 DIAGNOSIS — I350 Nonrheumatic aortic (valve) stenosis: Secondary | ICD-10-CM | POA: Diagnosis not present

## 2019-10-21 DIAGNOSIS — Z01812 Encounter for preprocedural laboratory examination: Secondary | ICD-10-CM

## 2019-10-21 NOTE — Progress Notes (Signed)
[facility] Consult Note  Chief Complaint  Patient presents with  . New Patient (Initial Visit)    severe aortic stenosis    History of Present Illness: 85 yo female with history of HTN, hyperlipidemia, hypothyroidism, sleep apnea, dementia, osteoporosis, persistent atrial fibrillation, CAD and severe aortic stenosis who is here today as a new consult, referred by Dr. Debara Pickett, for further discussion regarding her aortic stenosis and possible TAVR. She is known to have CAD and had an obtuse marginal treated with a bare metal stent in February 2016. Mild disease in the LAD and RCA. She has persistent atrial fibrillation and has been on coumadin. She has failed previous cardioversion attempts. She has been followed for moderate aortic stenosis for several years. Most recent echo 10/04/19 with LVEF=60-65%. The aortic valve leaflets are thickened with poor leaflet excursion, mean gradient 47 mmHg, peak gradient 64 mmHg, AVA 0.69 cm2, dimensionless index 0.18.   She tells me today that she has been having progressive dyspnea and fatigue. Some dizziness but no syncope. No lower extremity edema. She has some memory issues. Her daughter lives with her in [place]. She worked in a [employer]. She full dentures.   Primary Care Physician: [name], MD Primary Cardiologist: Mali Hilty Referring Cardiologist: Mali Hilty  Past Medical History:  Diagnosis Date  . A-fib ([place])    a. Dx 01/2015, CHA2DS2VASc = 5-->coumadin started.  Marland Kitchen CAD (coronary artery disease)    a. 01/2015 NSTEMI/PCI: LM nl, LAD 30p, LCX nondom, 30-40p, 50d, OM1 100p (2.75x16 Rebel BMS), RCA mild diff plaque, EF 55%.  . Candida infection, disseminated ([place])   . H/O echocardiogram    a. 01/2015 Echo: EF 50-55%, mild LVH, no rwma, mild MR, mildly dil LA, mod TR, PASP 53 mmHg.  Marland Kitchen Hyperlipidemia   . Hypertension   . Hypothyroid   . Osteoporosis   . Sleep apnea 04/2012   Mild/ AHI 10/CPAP 7cm h2o w/2 L o2    Past  Surgical History:  Procedure Laterality Date  . CARDIOVERSION N/A 08/22/2015   Procedure: CARDIOVERSION;  Surgeon: Skeet Latch, MD;  Location: [facility];  Service: Cardiovascular;  Laterality: N/A;  . KNEE SURGERY    . LEFT HEART CATHETERIZATION WITH CORONARY ANGIOGRAM N/A 02/20/2015   Procedure: LEFT HEART CATHETERIZATION WITH CORONARY ANGIOGRAM;  Surgeon: Jolaine Artist, MD;  Location[facility] C[facility] LAB;  Service: Cardiovascular;  Laterality: N/A;  . PERCUTANEOUS CORONARY STENT INTERVENTION (PCI-S)  02/20/2015   Procedure: PERCUTANEOUS CORONARY STENT INTERVENTION (PCI-S);  Surgeon: Jolaine Artist, MD;  Location: [facility] CATH LAB;  Service: Cardiovascular;;  OM1  (2.75/57mm Rebel)    Current Outpatient Medications  Medication Sig Dispense Refill  . ALPRAZolam (XANAX) 0.25 MG tablet TAKE 1 TABLET BY MOUTH EVERY DAY AT BEDTIME AS NEEDED FOR SLEEP 30 tablet 1  . amLODipine (NORVASC) 5 MG tablet TAKE 1 TABLET (5 MG TOTAL) DAILY BY MOUTH. 90 tablet 1  . atorvastatin (LIPITOR) 80 MG tablet TAKE 1 TABLET BY MOUTH DAILY X 6PM 90 tablet 2  . calcium carbonate 1250 MG capsule Take 1,250 mg by mouth 2 (two) times daily with a meal.    . cholecalciferol (VITAMIN D) 1000 UNITS tablet Take 1,000 Units by mouth daily.    . clopidogrel (PLAVIX) 75 MG tablet TAKE 1 TABLET BY MOUTH EVERY DAY 90 tablet 2  . cyanocobalamin 1000 MCG tablet Take 100 mcg by mouth daily.    Marland Kitchen levothyroxine (SYNTHROID) 137 MCG tablet TAKE 1 TABLET EVERY  DAY 30 tablet 0  . lisinopril (ZESTRIL) 5 MG tablet Take 1 tablet (5 mg total) by mouth daily. 30 tablet 9  . metoprolol tartrate (LOPRESSOR) 50 MG tablet TAKE 1 TABLET BY MOUTH TWICE A DAY 180 tablet 2  . pantoprazole (PROTONIX) 40 MG tablet TAKE 1 TABLET BY MOUTH EVERY DAY 90 tablet 2  . traMADol (ULTRAM) 50 MG tablet Take 1 tablet (50 mg total) by mouth every 8 (eight) hours as needed. 30 tablet 0  . warfarin (COUMADIN) 1 MG tablet TAKE 2-3 TABLETS BY MOUTH DAILY AS DIRECTED BY  COUMADIN CLINIC 240 tablet 1   Current Facility-Administered Medications  Medication Dose Route Frequency Provider Last Rate Last Dose  . denosumab (PROLIA) injection 60 mg  60 mg Subcutaneous Q6 months [name], MD   60 mg at 05/26/19 1055    Allergies  Allergen Reactions  . Fosamax [Alendronate Sodium] Other (See Comments)    Unknown per daughter    Social History   Socioeconomic History  . Marital status: Married    Spouse name: Not on file  . Number of children: Not on file  . Years of education: Not on file  . Highest education level: Not on file  Occupational History  . Not on file  Social Needs  . Financial resource strain: Not on file  . Food insecurity    Worry: Not on file    Inability: Not on file  . Transportation needs    Medical: Not on file    Non-medical: Not on file  Tobacco Use  . Smoking status: Former Smoker    Quit date: 04/28/1995    Years since quitting: 24.4  . Smokeless tobacco: Never Used  Substance and Sexual Activity  . Alcohol use: No    Alcohol/week: 0.0 standard drinks  . Drug use: No  . Sexual activity: Not on file  Lifestyle  . Physical activity    Days per week: Not on file    Minutes per session: Not on file  . Stress: Not on file  Relationships  . Social Herbalist on phone: Not on file    Gets together: Not on file    Attends religious service: Not on file    Active member of club or organization: Not on file    Attends meetings of clubs or organizations: Not on file    Relationship status: Not on file  . Intimate partner violence    Fear of current or ex partner: Not on file    Emotionally abused: Not on file    Physically abused: Not on file    Forced sexual activity: Not on file  Other Topics Concern  . Not on file  Social History Narrative  . Not on file    Family History  Problem Relation Age of Onset  . Coronary artery disease Father   . Diabetes Father   . Stroke Mother   . Arthritis  Mother     Review of Systems:  As stated in the HPI and otherwise negative.   BP 118/72   Pulse 71   Ht 5\' 2"  (1.575 m)   Wt 173 lb 12.8 oz (78.8 kg)   SpO2 97%   BMI 31.79 kg/m   Physical Examination: General: Well developed, well nourished, NAD  HEENT: OP clear, mucus membranes moist  SKIN: warm, dry. No rashes. Neuro: No focal deficits  Musculoskeletal: Muscle strength 5/5 all ext  Psychiatric: Mood and affect normal  Neck: No JVD, no carotid bruits, no thyromegaly, no lymphadenopathy.  Lungs:Clear bilaterally, no wheezes, rhonci, crackles Cardiovascular: Regular rate and rhythm. Loud, harsh, late peaking systolic murmur.  Abdomen:Soft. Bowel sounds present. Non-tender.  Extremities: No lower extremity edema. Pulses are 2 + in the bilateral DP/PT.  EKG:  EKG is not ordered today. The ekg ordered today demonstrates   Echo 10/04/19:  1. Left ventricular ejection fraction, by visual estimation, is 60 to 65%. The left ventricle has normal function. Normal left ventricular size. There is mildly increased left ventricular hypertrophy.  2. Global right ventricle has normal systolic function.The right ventricular size is normal. No increase in right ventricular wall thickness.  3. Left atrial size was mild-moderately dilated.  4. Right atrial size was normal.  5. The mitral valve is normal in structure. No evidence of mitral valve regurgitation. No evidence of mitral stenosis.  6. The tricuspid valve is normal in structure. Tricuspid valve regurgitation is mild.  7. Aortic valve mean gradient measures 40.5 mmHg.  8. Aortic valve peak gradient measures 60.2 mmHg.  9. The aortic valve is tricuspid Aortic valve regurgitation was not visualized by color flow Doppler. Severe aortic valve stenosis. 10. Peak aortic velocity 15m/s. Mean 55mmHg. Severe AS. 11. The pulmonic valve was normal in structure. Pulmonic valve regurgitation is mild by color flow Doppler. 12. Moderately elevated  pulmonary artery systolic pressure. 13. The inferior vena cava is normal in size with greater than 50% respiratory variability, suggesting right atrial pressure of 3 mmHg.  FINDINGS  Left Ventricle: Left ventricular ejection fraction, by visual estimation, is 60 to 65%. The left ventricle has normal function. There is mildly increased left ventricular hypertrophy. Normal left ventricular size.  Right Ventricle: The right ventricular size is normal. No increase in right ventricular wall thickness. Global RV systolic function is has normal systolic function. The tricuspid regurgitant velocity is 2.77 m/s, and with an assumed right atrial pressure  of 10 mmHg, the estimated right ventricular systolic pressure is moderately elevated at 40.7 mmHg.  Left Atrium: Left atrial size was mild-moderately dilated.  Right Atrium: Right atrial size was normal in size  Pericardium: There is no evidence of pericardial effusion.  Mitral Valve: The mitral valve is normal in structure. No evidence of mitral valve stenosis by observation. No evidence of mitral valve regurgitation.  Tricuspid Valve: The tricuspid valve is normal in structure. Tricuspid valve regurgitation is mild by color flow Doppler.  Aortic Valve: The aortic valve is tricuspid. Aortic valve regurgitation was not visualized by color flow Doppler. Severe aortic stenosis is present. Aortic valve mean gradient measures 40.5 mmHg. Aortic valve peak gradient measures 60.2 mmHg. Aortic  valve area, by VTI measures 0.69 cm. Peak aortic velocity 14m/s. Mean 43mmHg. Severe AS.  Pulmonic Valve: The pulmonic valve was normal in structure. Pulmonic valve regurgitation is mild by color flow Doppler.  Aorta: The aortic root, ascending aorta and aortic arch are all structurally normal, with no evidence of dilitation or obstruction.  Venous: The inferior vena cava is normal in size with greater than 50% respiratory variability, suggesting right  atrial pressure of 3 mmHg.  IAS/Shunts: No atrial level shunt detected by color flow Doppler. No ventricular septal defect is seen or detected. There is no evidence of an atrial septal defect.     LEFT VENTRICLE PLAX 2D LVIDd:         4.20 cm  Diastology LVIDs:         3.00 cm  LV e'  lateral:   11.30 cm/s LV PW:         1.20 cm  LV E/e' lateral: 10.1 LV IVS:        1.10 cm  LV e' medial:    10.30 cm/s LVOT diam:     2.20 cm  LV E/e' medial:  11.1 LV SV:         44 ml LV SV Index:   23.36 LVOT Area:     3.80 cm    RIGHT VENTRICLE RV Basal diam:  3.10 cm RV S prime:     10.10 cm/s TAPSE (M-mode): 1.9 cm  LEFT ATRIUM             Index       RIGHT ATRIUM           Index LA diam:        5.30 cm 2.97 cm/m  RA Area:     19.50 cm LA Vol (A2C):   91.6 ml 51.34 ml/m RA Volume:   57.40 ml  32.17 ml/m LA Vol (A4C):   73.3 ml 41.09 ml/m LA Biplane Vol: 83.2 ml 46.63 ml/m  AORTIC VALVE AV Area (Vmax):    0.61 cm AV Area (Vmean):   0.57 cm AV Area (VTI):     0.69 cm AV Vmax:           388.00 cm/s AV Vmean:          304.500 cm/s AV VTI:            0.922 m AV Peak Grad:      60.2 mmHg AV Mean Grad:      40.5 mmHg LVOT Vmax:         61.83 cm/s LVOT Vmean:        45.800 cm/s LVOT VTI:          0.167 m LVOT/AV VTI ratio: 0.18   AORTA Ao Root diam: 2.60 cm Ao Asc diam:  3.30 cm  MITRAL VALVE                         TRICUSPID VALVE MV Area (PHT): 3.99 cm              TR Peak grad:   30.7 mmHg MV PHT:        55.10 msec            TR Vmax:        296.00 cm/s MV Decel Time: 190 msec MV E velocity: 114.00 cm/s 103 cm/s  SHUNTS MV A velocity: 34.50 cm/s  70.3 cm/s Systemic VTI:  0.17 m MV E/A ratio:  3.30        1.5       Systemic Diam: 2.20 cm  Recent Labs: 07/10/2019: BUN 16; Creatinine, Ser 0.94; Hemoglobin 12.9; Platelets 192; Potassium 3.9; Sodium 139   Lipid Panel    Component Value Date/Time   CHOL 116 09/30/2017 0826   TRIG 134 09/30/2017 0826   HDL 41 (L)  09/30/2017 0826   CHOLHDL 2.8 09/30/2017 0826   VLDL 18 09/19/2016 1036   LDLCALC 53 09/30/2017 0826     Wt Readings from Last 3 Encounters:  10/21/19 173 lb 12.8 oz (78.8 kg)  10/18/19 175 lb (79.4 kg)  07/13/19 170 lb (77.1 kg)     Other studies Reviewed: Additional studies/ records that were reviewed today include: echo images, office notes. Review of the above records demonstrates: severe AS  Assessment and Plan:   1. Severe Aortic Valve Stenosis: She has severe, stage D aortic valve stenosis. I have personally reviewed the echo images. The aortic valve is thickened, calcified with limited leaflet mobility. I think she would benefit from AVR. Given advanced age, she is not a good candidate for conventional AVR by surgical approach. I think she may be a good candidate for TAVR.   STS Risk Score: Risk of Mortality: 5.267% Renal Failure: 3.964% Permanent Stroke: 3.824% Prolonged Ventilation: 11.136% DSW Infection: 0.088% Reoperation: 4.169% Morbidity or Mortality: 17.314% Short Length of Stay: 18.078% Long Length of Stay: 7.981%  I have reviewed the natural history of aortic stenosis with the patient and their family members  who are present today. We have discussed the limitations of medical therapy and the poor prognosis associated with symptomatic aortic stenosis. We have reviewed potential treatment options, including palliative medical therapy, conventional surgical aortic valve replacement, and transcatheter aortic valve replacement. We discussed treatment options in the context of the patient's specific comorbid medical conditions.   She would like to proceed with planning for TAVR. I will arrange a right and left heart catheterization at [facility] 10/27/19. Risks and benefits of the cath procedure and the valve procedure are reviewed with the patient. After the cath, she will have a cardiac CT, CTA of the chest/abdomen and pelvis, carotid artery dopplers, PT assessment  and will then be referred to see one of the CT surgeons on our TAVR team.      Current medicines are reviewed at length with the patient today.  The patient does not have concerns regarding medicines.  The following changes have been made:  no change  Labs/ tests ordered today include:   Orders Placed This Encounter  Procedures  . Basic metabolic panel  . CBC     Disposition:   FU with the valve team.    Signed, Lauree Chandler, MD 10/21/2019 3:32 PM    [address], [place], [place]  [address] Phone: [phone]; Fax: [phone]

## 2019-10-21 NOTE — Telephone Encounter (Signed)
Spoke with pt's daughter, scheduled follow-up appt in 1 week after upcoming procedure on 10/27/19.  Appt on 11/03/19 at 9am.

## 2019-10-21 NOTE — Telephone Encounter (Signed)
-----   Message from Rodman Key, RN sent at 10/21/2019  3:43 PM EDT ----- Regarding: appointment Patient is scheduled for right and left heart cath on 10/28 for TAVR workup. She is holding coumadin after tonight. She is scheduled with Cou[facility]in Clinic on 10/27 and wont be needing this appointment.   The daughter knows they do not need to come and I will cancel this.  Just not sure about when her next visit should be.  Thanks, [employer]

## 2019-10-21 NOTE — Telephone Encounter (Signed)
Attempted to contact pt or her daughter to reschedule Coumadin Clinic appt from 10/26/19 to 1 week after R and L heart cath scheduled on 10/27/19. Pt will be holding her Coumadin 5 days prior to procedure. LMOM TCB for appt.

## 2019-10-21 NOTE — H&P (View-Only) (Signed)
[facility] Consult Note  Chief Complaint  Patient presents with  . New Patient (Initial Visit)    severe aortic stenosis    History of Present Illness: 85 yo female with history of HTN, hyperlipidemia, hypothyroidism, sleep apnea, dementia, osteoporosis, persistent atrial fibrillation, CAD and severe aortic stenosis who is here today as a new consult, referred by Dr. Debara Pickett, for further discussion regarding her aortic stenosis and possible TAVR. She is known to have CAD and had an obtuse marginal treated with a bare metal stent in February 2016. Mild disease in the LAD and RCA. She has persistent atrial fibrillation and has been on coumadin. She has failed previous cardioversion attempts. She has been followed for moderate aortic stenosis for several years. Most recent echo 10/04/19 with LVEF=60-65%. The aortic valve leaflets are thickened with poor leaflet excursion, mean gradient 47 mmHg, peak gradient 64 mmHg, AVA 0.69 cm2, dimensionless index 0.18.   She tells me today that she has been having progressive dyspnea and fatigue. Some dizziness but no syncope. No lower extremity edema. She has some memory issues. Her daughter lives with her in [place]. She worked in a [employer]. She full dentures.   Primary Care Physician: [name], MD Primary Cardiologist: Mali Hilty Referring Cardiologist: Mali Hilty  Past Medical History:  Diagnosis Date  . A-fib ([place])    a. Dx 01/2015, CHA2DS2VASc = 5-->coumadin started.  Marland Kitchen CAD (coronary artery disease)    a. 01/2015 NSTEMI/PCI: LM nl, LAD 30p, LCX nondom, 30-40p, 50d, OM1 100p (2.75x16 Rebel BMS), RCA mild diff plaque, EF 55%.  . Candida infection, disseminated ([place])   . H/O echocardiogram    a. 01/2015 Echo: EF 50-55%, mild LVH, no rwma, mild MR, mildly dil LA, mod TR, PASP 53 mmHg.  Marland Kitchen Hyperlipidemia   . Hypertension   . Hypothyroid   . Osteoporosis   . Sleep apnea 04/2012   Mild/ AHI 10/CPAP 7cm h2o w/2 L o2    Past  Surgical History:  Procedure Laterality Date  . CARDIOVERSION N/A 08/22/2015   Procedure: CARDIOVERSION;  Surgeon: Skeet Latch, MD;  Location: [place];  Service: Cardiovascular;  Laterality: N/A;  . KNEE SURGERY    . LEFT HEART CATHETERIZATION WITH CORONARY ANGIOGRAM N/A 02/20/2015   Procedure: LEFT HEART CATHETERIZATION WITH CORONARY ANGIOGRAM;  Surgeon: Jolaine Artist, MD;  Location: [facility] CATH LAB;  Service: Cardiovascular;  Laterality: N/A;  . PERCUTANEOUS CORONARY STENT INTERVENTION (PCI-S)  02/20/2015   Procedure: PERCUTANEOUS CORONARY STENT INTERVENTION (PCI-S);  Surgeon: Jolaine Artist, MD;  Location: [facility] CATH LAB;  Service: Cardiovascular;;  OM1  (2.75/2mm Rebel)    Current Outpatient Medications  Medication Sig Dispense Refill  . ALPRAZolam (XANAX) 0.25 MG tablet TAKE 1 TABLET BY MOUTH EVERY DAY AT BEDTIME AS NEEDED FOR SLEEP 30 tablet 1  . amLODipine (NORVASC) 5 MG tablet TAKE 1 TABLET (5 MG TOTAL) DAILY BY MOUTH. 90 tablet 1  . atorvastatin (LIPITOR) 80 MG tablet TAKE 1 TABLET BY MOUTH DAILY X 6PM 90 tablet 2  . calcium carbonate 1250 MG capsule Take 1,250 mg by mouth 2 (two) times daily with a meal.    . cholecalciferol (VITAMIN D) 1000 UNITS tablet Take 1,000 Units by mouth daily.    . clopidogrel (PLAVIX) 75 MG tablet TAKE 1 TABLET BY MOUTH EVERY DAY 90 tablet 2  . cyanocobalamin 1000 MCG tablet Take 100 mcg by mouth daily.    Marland Kitchen levothyroxine (SYNTHROID) 137 MCG tablet TAKE 1 TABLET EVERY  DAY 30 tablet 0  . lisinopril (ZESTRIL) 5 MG tablet Take 1 tablet (5 mg total) by mouth daily. 30 tablet 9  . metoprolol tartrate (LOPRESSOR) 50 MG tablet TAKE 1 TABLET BY MOUTH TWICE A DAY 180 tablet 2  . pantoprazole (PROTONIX) 40 MG tablet TAKE 1 TABLET BY MOUTH EVERY DAY 90 tablet 2  . traMADol (ULTRAM) 50 MG tablet Take 1 tablet (50 mg total) by mouth every 8 (eight) hours as needed. 30 tablet 0  . warfarin (COUMADIN) 1 MG tablet TAKE 2-3 TABLETS BY MOUTH DAILY AS DIRECTED BY  COUMADIN CLINIC 240 tablet 1   Current Facility-Administered Medications  Medication Dose Route Frequency Provider Last Rate Last Dose  . denosumab (PROLIA) injection 60 mg  60 mg Subcutaneous Q6 months [name], MD   60 mg at 05/26/19 1055    Allergies  Allergen Reactions  . Fosamax [Alendronate Sodium] Other (See Comments)    Unknown per daughter    Social History   Socioeconomic History  . Marital status: Married    Spouse name: Not on file  . Number of children: Not on file  . Years of education: Not on file  . Highest education level: Not on file  Occupational History  . Not on file  Social Needs  . Financial resource strain: Not on file  . Food insecurity    Worry: Not on file    Inability: Not on file  . Transportation needs    Medical: Not on file    Non-medical: Not on file  Tobacco Use  . Smoking status: Former Smoker    Quit date: 04/28/1995    Years since quitting: 24.4  . Smokeless tobacco: Never Used  Substance and Sexual Activity  . Alcohol use: No    Alcohol/week: 0.0 standard drinks  . Drug use: No  . Sexual activity: Not on file  Lifestyle  . Physical activity    Days per week: Not on file    Minutes per session: Not on file  . Stress: Not on file  Relationships  . Social Herbalist on phone: Not on file    Gets together: Not on file    Attends religious service: Not on file    Active member of club or organization: Not on file    Attends meetings of clubs or organizations: Not on file    Relationship status: Not on file  . Intimate partner violence    Fear of current or ex partner: Not on file    Emotionally abused: Not on file    Physically abused: Not on file    Forced sexual activity: Not on file  Other Topics Concern  . Not on file  Social History Narrative  . Not on file    Family History  Problem Relation Age of Onset  . Coronary artery disease Father   . Diabetes Father   . Stroke Mother   . Arthritis  Mother     Review of Systems:  As stated in the HPI and otherwise negative.   BP 118/72   Pulse 71   Ht 5\' 2"  (1.575 m)   Wt 173 lb 12.8 oz (78.8 kg)   SpO2 97%   BMI 31.79 kg/m   Physical Examination: General: Well developed, well nourished, NAD  HEENT: OP clear, mucus membranes moist  SKIN: warm, dry. No rashes. Neuro: No focal deficits  Musculoskeletal: Muscle strength 5/5 all ext  Psychiatric: Mood and affect normal  Neck: No JVD, no carotid bruits, no thyromegaly, no lymphadenopathy.  Lungs:Clear bilaterally, no wheezes, rhonci, crackles Cardiovascular: Regular rate and rhythm. Loud, harsh, late peaking systolic murmur.  Abdomen:Soft. Bowel sounds present. Non-tender.  Extremities: No lower extremity edema. Pulses are 2 + in the bilateral DP/PT.  EKG:  EKG is not ordered today. The ekg ordered today demonstrates   Echo 10/04/19:  1. Left ventricular ejection fraction, by visual estimation, is 60 to 65%. The left ventricle has normal function. Normal left ventricular size. There is mildly increased left ventricular hypertrophy.  2. Global right ventricle has normal systolic function.The right ventricular size is normal. No increase in right ventricular wall thickness.  3. Left atrial size was mild-moderately dilated.  4. Right atrial size was normal.  5. The mitral valve is normal in structure. No evidence of mitral valve regurgitation. No evidence of mitral stenosis.  6. The tricuspid valve is normal in structure. Tricuspid valve regurgitation is mild.  7. Aortic valve mean gradient measures 40.5 mmHg.  8. Aortic valve peak gradient measures 60.2 mmHg.  9. The aortic valve is tricuspid Aortic valve regurgitation was not visualized by color flow Doppler. Severe aortic valve stenosis. 10. Peak aortic velocity 22m/s. Mean 66mmHg. Severe AS. 11. The pulmonic valve was normal in structure. Pulmonic valve regurgitation is mild by color flow Doppler. 12. Moderately elevated  pulmonary artery systolic pressure. 13. The inferior vena cava is normal in size with greater than 50% respiratory variability, suggesting right atrial pressure of 3 mmHg.  FINDINGS  Left Ventricle: Left ventricular ejection fraction, by visual estimation, is 60 to 65%. The left ventricle has normal function. There is mildly increased left ventricular hypertrophy. Normal left ventricular size.  Right Ventricle: The right ventricular size is normal. No increase in right ventricular wall thickness. Global RV systolic function is has normal systolic function. The tricuspid regurgitant velocity is 2.77 m/s, and with an assumed right atrial pressure  of 10 mmHg, the estimated right ventricular systolic pressure is moderately elevated at 40.7 mmHg.  Left Atrium: Left atrial size was mild-moderately dilated.  Right Atrium: Right atrial size was normal in size  Pericardium: There is no evidence of pericardial effusion.  Mitral Valve: The mitral valve is normal in structure. No evidence of mitral valve stenosis by observation. No evidence of mitral valve regurgitation.  Tricuspid Valve: The tricuspid valve is normal in structure. Tricuspid valve regurgitation is mild by color flow Doppler.  Aortic Valve: The aortic valve is tricuspid. Aortic valve regurgitation was not visualized by color flow Doppler. Severe aortic stenosis is present. Aortic valve mean gradient measures 40.5 mmHg. Aortic valve peak gradient measures 60.2 mmHg. Aortic  valve area, by VTI measures 0.69 cm. Peak aortic velocity 4m/s. Mean 22mmHg. Severe AS.  Pulmonic Valve: The pulmonic valve was normal in structure. Pulmonic valve regurgitation is mild by color flow Doppler.  Aorta: The aortic root, ascending aorta and aortic arch are all structurally normal, with no evidence of dilitation or obstruction.  Venous: The inferior vena cava is normal in size with greater than 50% respiratory variability, suggesting right  atrial pressure of 3 mmHg.  IAS/Shunts: No atrial level shunt detected by color flow Doppler. No ventricular septal defect is seen or detected. There is no evidence of an atrial septal defect.     LEFT VENTRICLE PLAX 2D LVIDd:         4.20 cm  Diastology LVIDs:         3.00 cm  LV e'  lateral:   11.30 cm/s LV PW:         1.20 cm  LV E/e' lateral: 10.1 LV IVS:        1.10 cm  LV e' medial:    10.30 cm/s LVOT diam:     2.20 cm  LV E/e' medial:  11.1 LV SV:         44 ml LV SV Index:   23.36 LVOT Area:     3.80 cm    RIGHT VENTRICLE RV Basal diam:  3.10 cm RV S prime:     10.10 cm/s TAPSE (M-mode): 1.9 cm  LEFT ATRIUM             Index       RIGHT ATRIUM           Index LA diam:        5.30 cm 2.97 cm/m  RA Area:     19.50 cm LA Vol (A2C):   91.6 ml 51.34 ml/m RA Volume:   57.40 ml  32.17 ml/m LA Vol (A4C):   73.3 ml 41.09 ml/m LA Biplane Vol: 83.2 ml 46.63 ml/m  AORTIC VALVE AV Area (Vmax):    0.61 cm AV Area (Vmean):   0.57 cm AV Area (VTI):     0.69 cm AV Vmax:           388.00 cm/s AV Vmean:          304.500 cm/s AV VTI:            0.922 m AV Peak Grad:      60.2 mmHg AV Mean Grad:      40.5 mmHg LVOT Vmax:         61.83 cm/s LVOT Vmean:        45.800 cm/s LVOT VTI:          0.167 m LVOT/AV VTI ratio: 0.18   AORTA Ao Root diam: 2.60 cm Ao Asc diam:  3.30 cm  MITRAL VALVE                         TRICUSPID VALVE MV Area (PHT): 3.99 cm              TR Peak grad:   30.7 mmHg MV PHT:        55.10 msec            TR Vmax:        296.00 cm/s MV Decel Time: 190 msec MV E velocity: 114.00 cm/s 103 cm/s  SHUNTS MV A velocity: 34.50 cm/s  70.3 cm/s Systemic VTI:  0.17 m MV E/A ratio:  3.30        1.5       Systemic Diam: 2.20 cm  Recent Labs: 07/10/2019: BUN 16; Creatinine, Ser 0.94; Hemoglobin 12.9; Platelets 192; Potassium 3.9; Sodium 139   Lipid Panel    Component Value Date/Time   CHOL 116 09/30/2017 0826   TRIG 134 09/30/2017 0826   HDL 41 (L)  09/30/2017 0826   CHOLHDL 2.8 09/30/2017 0826   VLDL 18 09/19/2016 1036   LDLCALC 53 09/30/2017 0826     Wt Readings from Last 3 Encounters:  10/21/19 173 lb 12.8 oz (78.8 kg)  10/18/19 175 lb (79.4 kg)  07/13/19 170 lb (77.1 kg)     Other studies Reviewed: Additional studies/ records that were reviewed today include: echo images, office notes. Review of the above records demonstrates: severe AS  Assessment and Plan:   1. Severe Aortic Valve Stenosis: She has severe, stage D aortic valve stenosis. I have personally reviewed the echo images. The aortic valve is thickened, calcified with limited leaflet mobility. I think she would benefit from AVR. Given advanced age, she is not a good candidate for conventional AVR by surgical approach. I think she may be a good candidate for TAVR.   STS Risk Score: Risk of Mortality: 5.267% Renal Failure: 3.964% Permanent Stroke: 3.824% Prolonged Ventilation: 11.136% DSW Infection: 0.088% Reoperation: 4.169% Morbidity or Mortality: 17.314% Short Length of Stay: 18.078% Long Length of Stay: 7.981%  I have reviewed the natural history of aortic stenosis with the patient and their family members  who are present today. We have discussed the limitations of medical therapy and the poor prognosis associated with symptomatic aortic stenosis. We have reviewed potential treatment options, including palliative medical therapy, conventional surgical aortic valve replacement, and transcatheter aortic valve replacement. We discussed treatment options in the context of the patient's specific comorbid medical conditions.   She would like to proceed with planning for TAVR. I will arrange a right and left heart catheterization at [facility] 10/27/19. Risks and benefits of the cath procedure and the valve procedure are reviewed with the patient. After the cath, she will have a cardiac CT, CTA of the chest/abdomen and pelvis, carotid artery dopplers, PT assessment  and will then be referred to see one of the CT surgeons on our TAVR team.      Current medicines are reviewed at length with the patient today.  The patient does not have concerns regarding medicines.  The following changes have been made:  no change  Labs/ tests ordered today include:   Orders Placed This Encounter  Procedures  . Basic metabolic panel  . CBC     Disposition:   FU with the valve team.    Signed, Lauree Chandler, MD 10/21/2019 3:32 PM    [address] [place], [place], [place]  [address] Phone: [phone]; Fax: [phone]

## 2019-10-21 NOTE — Patient Instructions (Addendum)
Medication Instructions:  No changes *If you need a refill on your cardiac medications before your next appointment, please call your pharmacy*  Lab Work: Today: BMET, CBC If you have labs (blood work) drawn today and your tests are completely normal, you will receive your results only by: Marland Kitchen MyChart Message (if you have MyChart) OR . A paper copy in the mail If you have any lab test that is abnormal or we need to change your treatment, we will call you to review the results.  Testing/Procedures: Your physician has requested that you have a cardiac catheterization. Cardiac catheterization is used to diagnose and/or treat various heart conditions. Doctors may recommend this procedure for a number of different reasons. The most common reason is to evaluate chest pain. Chest pain can be a symptom of coronary artery disease (CAD), and cardiac catheterization can show whether plaque is narrowing or blocking your heart's arteries. This procedure is also used to evaluate the valves, as well as measure the blood flow and oxygen levels in different parts of your heart. For further information please visit [email or web address]. Please follow instruction sheet, as given.    Follow-Up: Theodosia Quay, RN Nurse Navigator will contact you regarding the next steps. Other Instructions  Your Pre-procedure COVID-19 Testing will be done on Monday October 25, 2019 at 9:00 am, [place], After your swab you will be given a mask to wear and instructed to go home and quarantine you are not allowed to have visitors until after your procedure. If you test positive you will be notified and your procedure will be cancelled.

## 2019-10-22 LAB — CBC
Hematocrit: 39.9 % (ref 34.0–46.6)
Hemoglobin: 13.2 g/dL (ref 11.1–15.9)
MCH: 28.3 pg (ref 26.6–33.0)
MCHC: 33.1 g/dL (ref 31.5–35.7)
MCV: 86 fL (ref 79–97)
Platelets: 236 10*3/uL (ref 150–450)
RBC: 4.66 x10E6/uL (ref 3.77–5.28)
RDW: 13 % (ref 11.7–15.4)
WBC: 6.9 10*3/uL (ref 3.4–10.8)

## 2019-10-22 LAB — BASIC METABOLIC PANEL
BUN/Creatinine Ratio: 19 (ref 12–28)
BUN: 17 mg/dL (ref 8–27)
CO2: 26 mmol/L (ref 20–29)
Calcium: 9.1 mg/dL (ref 8.7–10.3)
Chloride: 104 mmol/L (ref 96–106)
Creatinine, Ser: 0.91 mg/dL (ref 0.57–1.00)
GFR calc Af Amer: 67 mL/min/{1.73_m2} (ref >59)
GFR calc non Af Amer: 58 mL/min/{1.73_m2} — ABNORMAL LOW (ref >59)
Glucose: 87 mg/dL (ref 65–99)
Potassium: 4.2 mmol/L (ref 3.5–5.2)
Sodium: 141 mmol/L (ref 134–144)

## 2019-10-23 ENCOUNTER — Other Ambulatory Visit: Payer: Self-pay | Admitting: Internal Medicine

## 2019-10-25 ENCOUNTER — Encounter: Payer: Self-pay | Admitting: Family Medicine

## 2019-10-25 ENCOUNTER — Telehealth: Payer: Self-pay | Admitting: Physician Assistant

## 2019-10-25 ENCOUNTER — Ambulatory Visit (INDEPENDENT_AMBULATORY_CARE_PROVIDER_SITE_OTHER): Payer: Medicare Other | Admitting: Family Medicine

## 2019-10-25 ENCOUNTER — Other Ambulatory Visit: Payer: Self-pay

## 2019-10-25 ENCOUNTER — Other Ambulatory Visit (HOSPITAL_COMMUNITY)
Admission: RE | Admit: 2019-10-25 | Discharge: 2019-10-25 | Disposition: A | Discharge status: Home / Self Care | Payer: Medicare Other | Source: Ambulatory Visit | Attending: Cardiovascular Disease | Admitting: Cardiovascular Disease

## 2019-10-25 VITALS — BP 110/60 | HR 68 | Temp 97.6°F | Resp 18 | Ht <= 58 in | Wt 175.0 lb

## 2019-10-25 DIAGNOSIS — Z01812 Encounter for preprocedural laboratory examination: Secondary | ICD-10-CM | POA: Diagnosis not present

## 2019-10-25 DIAGNOSIS — Z23 Encounter for immunization: Secondary | ICD-10-CM

## 2019-10-25 DIAGNOSIS — Z20828 Contact with and (suspected) exposure to other viral communicable diseases: Secondary | ICD-10-CM | POA: Insufficient documentation

## 2019-10-25 DIAGNOSIS — L03113 Cellulitis of right upper limb: Secondary | ICD-10-CM

## 2019-10-25 LAB — SARS CORONAVIRUS 2 (TAT 6-24 HRS): SARS Coronavirus 2: NEGATIVE

## 2019-10-25 MED ORDER — CEPHALEXIN 500 MG PO CAPS: 500.0000 mg | ORAL_CAPSULE | Freq: Three times a day (TID) | ORAL | 0 refills | Status: DC

## 2019-10-25 NOTE — Telephone Encounter (Signed)
Refill request

## 2019-10-25 NOTE — Telephone Encounter (Addendum)
  [facility]   MULTIDISCIPLINARY HEART VALVE TEAM  Patient's daughter called in to report a new, erythematous, painful rash on [name]'s forearm. No hx of trauma or insect bite. She is scheduled for a cardiac cath this Wednesday. I have asked her to make an appointment with her PCP, which is scheduled at 2pm today and call us after.   Angelena Form PA-C  [facility]     3:30PM ADDENDUM:   She was seen by her PCP, Dr. Dennard Schaumann, today and dx'd with cellulitis. She was started on Keflex 500mg  BID x 7 days. We have cancelled her heart cath for this Wednesday. I have asked her to restart her coumadin at her normal dosing. She has an INR check on 11/4. Plan to reschedule her Covid Swab and L/RHC on 11/11.  Angelena Form PA-C  [facility]

## 2019-10-25 NOTE — Telephone Encounter (Signed)
Thanks Katie.

## 2019-10-25 NOTE — Progress Notes (Signed)
Subjective:    Patient ID: [name], female    DOB: 14-Oct-1933, 85 y.o.   MRN: [id number]  HPI  Patient has developed a linear patch of erythema, induration, warmth, and pain on the volar surface of her right forearm and over the triceps area.  Rashes been present for a few days.  It is warm to the touch.  It is tender and painful.  She has not had a recent IV.  She is currently on Coumadin for atrial fibrillation.  Although this makes thrombophlebitis highly unlikely.  Most likely a linear patch of cellulitis.  Please see photograph below:    Past Medical History:  Diagnosis Date  . A-fib ([place])    a. Dx 01/2015, CHA2DS2VASc = 5-->coumadin started.  Marland Kitchen CAD (coronary artery disease)    a. 01/2015 NSTEMI/PCI: LM nl, LAD 30p, LCX nondom, 30-40p, 50d, OM1 100p (2.75x16 Rebel BMS), RCA mild diff plaque, EF 55%.  . Candida infection, disseminated ([place])   . H/O echocardiogram    a. 01/2015 Echo: EF 50-55%, mild LVH, no rwma, mild MR, mildly dil LA, mod TR, PASP 53 mmHg.  Marland Kitchen Hyperlipidemia   . Hypertension   . Hypothyroid   . Osteoporosis   . Sleep apnea 04/2012   Mild/ AHI 10/CPAP 7cm h2o w/2 L o2   Past Surgical History:  Procedure Laterality Date  . CARDIOVERSION N/A 08/22/2015   Procedure: CARDIOVERSION;  Surgeon: Skeet Latch, MD;  Location: [place];  Service: Cardiovascular;  Laterality: N/A;  . KNEE SURGERY    . LEFT HEART CATHETERIZATION WITH CORONARY ANGIOGRAM N/A 02/20/2015   Procedure: LEFT HEART CATHETERIZATION WITH CORONARY ANGIOGRAM;  Surgeon: Jolaine Artist, MD;  Location: [facility] CATH LAB;  Service: Cardiovascular;  Laterality: N/A;  . PERCUTANEOUS CORONARY STENT INTERVENTION (PCI-S)  02/20/2015   Procedure: PERCUTANEOUS CORONARY STENT INTERVENTION (PCI-S);  Surgeon: Jolaine Artist, MD;  Location: [facility] CATH LAB;  Service: Cardiovascular;;  OM1  (2.75/61mm Rebel)   Current Outpatient Medications on File Prior to Visit  Medication Sig Dispense Refill  .  ALPRAZolam (XANAX) 0.25 MG tablet TAKE 1 TABLET BY MOUTH EVERY DAY AT BEDTIME AS NEEDED FOR SLEEP (Patient taking differently: Take 0.25 mg by mouth at bedtime. ) 30 tablet 1  . amLODipine (NORVASC) 5 MG tablet TAKE 1 TABLET (5 MG TOTAL) DAILY BY MOUTH. 90 tablet 1  . atorvastatin (LIPITOR) 80 MG tablet TAKE 1 TABLET BY MOUTH DAILY X 6PM (Patient taking differently: Take 80 mg by mouth daily at 6 PM. ) 90 tablet 2  . calcium carbonate 1250 MG capsule Take 1,250 mg by mouth 2 (two) times daily with a meal.    . cholecalciferol (VITAMIN D) 1000 UNITS tablet Take 1,000 Units by mouth daily.    . clopidogrel (PLAVIX) 75 MG tablet TAKE 1 TABLET BY MOUTH EVERY DAY 90 tablet 2  . cyanocobalamin 1000 MCG tablet Take 100 mcg by mouth daily.    Marland Kitchen levothyroxine (SYNTHROID) 137 MCG tablet TAKE 1 TABLET EVERY DAY 30 tablet 0  . lisinopril (ZESTRIL) 5 MG tablet Take 1 tablet (5 mg total) by mouth daily. 30 tablet 9  . metoprolol tartrate (LOPRESSOR) 50 MG tablet TAKE 1 TABLET BY MOUTH TWICE A DAY 180 tablet 2  . pantoprazole (PROTONIX) 40 MG tablet TAKE 1 TABLET BY MOUTH EVERY DAY 90 tablet 2  . traMADol (ULTRAM) 50 MG tablet Take 1 tablet (50 mg total) by mouth every 8 (eight) hours as needed. 30 tablet 0  .  warfarin (COUMADIN) 1 MG tablet TAKE 2-3 TABLETS BY MOUTH DAILY AS DIRECTED BY COUMADIN CLINIC (Patient not taking: Reported on 10/25/2019) 240 tablet 1   Current Facility-Administered Medications on File Prior to Visit  Medication Dose Route Frequency Provider Last Rate Last Dose  . denosumab (PROLIA) injection 60 mg  60 mg Subcutaneous Q6 months [name], MD   60 mg at 05/26/19 1055    Allergies  Allergen Reactions  . Fosamax [Alendronate Sodium] Other (See Comments)    Unknown per daughter   Social History   Socioeconomic History  . Marital status: Married    Spouse name: Not on file  . Number of children: Not on file  . Years of education: Not on file  . Highest education level:  Not on file  Occupational History  . Not on file  Social Needs  . Financial resource strain: Not on file  . Food insecurity    Worry: Not on file    Inability: Not on file  . Transportation needs    Medical: Not on file    Non-medical: Not on file  Tobacco Use  . Smoking status: Former Smoker    Quit date: 04/28/1995    Years since quitting: 24.5  . Smokeless tobacco: Never Used  Substance and Sexual Activity  . Alcohol use: No    Alcohol/week: 0.0 standard drinks  . Drug use: No  . Sexual activity: Not on file  Lifestyle  . Physical activity    Days per week: Not on file    Minutes per session: Not on file  . Stress: Not on file  Relationships  . Social Herbalist on phone: Not on file    Gets together: Not on file    Attends religious service: Not on file    Active member of club or organization: Not on file    Attends meetings of clubs or organizations: Not on file    Relationship status: Not on file  . Intimate partner violence    Fear of current or ex partner: Not on file    Emotionally abused: Not on file    Physically abused: Not on file    Forced sexual activity: Not on file  Other Topics Concern  . Not on file  Social History Narrative  . Not on file     Review of Systems  All other systems reviewed and are negative.      Objective:   Physical Exam  Constitutional: She appears well-developed and well-nourished. No distress.  Eyes: Conjunctivae are normal.  Cardiovascular: Normal rate. An irregularly irregular rhythm present.  Murmur heard. Pulmonary/Chest: Effort normal. No respiratory distress. She has no wheezes. She has no rhonchi.  Musculoskeletal:     Right lower leg: No edema.     Left lower leg: Edema present.  Skin: She is not diaphoretic. There is erythema.     Vitals reviewed.         Assessment & Plan:  Cellulitis of right arm - Plan: cephALEXin (KEFLEX) 500 MG capsule  Patient has cellulitis in the right upper  extremity.  Begin Keflex 500 mg p.o. 3 times daily for 7 days.  I will notify her cardiologist of the infection as she is scheduled for catheterization in 48 hours in case he decides to postpone catheterization

## 2019-10-25 NOTE — Addendum Note (Signed)
Addended by: Shary Decamp B on: 10/25/2019 03:41 PM   Modules accepted: Orders

## 2019-11-03 ENCOUNTER — Other Ambulatory Visit: Payer: Self-pay

## 2019-11-03 ENCOUNTER — Ambulatory Visit: Payer: Medicare Other | Admitting: Pharmacist

## 2019-11-03 DIAGNOSIS — Z5181 Encounter for therapeutic drug level monitoring: Secondary | ICD-10-CM

## 2019-11-03 DIAGNOSIS — I4819 Other persistent atrial fibrillation: Secondary | ICD-10-CM

## 2019-11-03 LAB — POCT INR: INR: 1.8 — AB (ref 2.0–3.0)

## 2019-11-03 NOTE — Patient Instructions (Signed)
Description   Take 3 tablets today and 2 tablets tomorrow, then hold Coumadin 11/6 - 11/10 for heart cath. Resume normal dose 2 tablets every day except 1 tablet on Tuesdays in the evening on 11/11 after procedure.  Continue on same amount of SlimFast 4 days a week on Tuesdays, Thursdays, Saturdays and Sundays. Continue eating your 2 servings of leafy green vegetable each week.  Recheck INR 1 week after procedure. Call with any questions. [phone]

## 2019-11-05 ENCOUNTER — Other Ambulatory Visit: Payer: Self-pay | Admitting: Family Medicine

## 2019-11-08 ENCOUNTER — Other Ambulatory Visit (HOSPITAL_COMMUNITY)
Admission: RE | Admit: 2019-11-08 | Discharge: 2019-11-08 | Disposition: A | Discharge status: Home / Self Care | Payer: Medicare Other | Source: Ambulatory Visit | Attending: Cardiovascular Disease | Admitting: Cardiovascular Disease

## 2019-11-08 ENCOUNTER — Other Ambulatory Visit: Payer: Self-pay | Admitting: Internal Medicine

## 2019-11-08 DIAGNOSIS — Z20828 Contact with and (suspected) exposure to other viral communicable diseases: Secondary | ICD-10-CM | POA: Diagnosis not present

## 2019-11-08 DIAGNOSIS — Z01812 Encounter for preprocedural laboratory examination: Secondary | ICD-10-CM | POA: Diagnosis not present

## 2019-11-08 LAB — SARS CORONAVIRUS 2 (TAT 6-24 HRS): SARS Coronavirus 2: NEGATIVE

## 2019-11-09 ENCOUNTER — Telehealth: Payer: Self-pay | Admitting: *Deleted

## 2019-11-09 ENCOUNTER — Other Ambulatory Visit: Payer: Self-pay

## 2019-11-09 DIAGNOSIS — I350 Nonrheumatic aortic (valve) stenosis: Secondary | ICD-10-CM

## 2019-11-09 NOTE — Telephone Encounter (Signed)
Pt contacted pre-catheterization scheduled at [facility] for: Wednesday November 10, 2019 8:30 AM Verified arrival time and place: [facility]) at: 6:30 AM   No solid food after midnight prior to cath, clear liquids until 5 AM day of procedure. Contrast allergy: no  Hold: Coumadin-none 11/05/19 until post procedure. Lisinopril-AM of procedure-GFR 58  Except hold medications AM meds can be  taken pre-cath with sip of water including: ASA 81 mg Plavix 75mg   Confirmed patient has responsible adult to drive home post procedure and observe 24 hours after arriving home: yes  Currently, due to Covid-19 pandemic, only one support person will be allowed with patient. Must be the same support person for that patient's entire stay, will be screened and required to wear a mask. They will be asked to wait in the waiting room for the duration of the patient's stay.  Patients are required to wear a mask when they enter the hospital.     COVID-19 Pre-Screening Questions:  . In the past 7 to 10 days have you had a cough,  shortness of breath, headache, congestion, fever (100 or greater) body aches, chills, sore throat, or sudden loss of taste or sense of smell? no . Have you been around anyone with known Covid 19? no . Have you been around anyone who is awaiting Covid 19 test results in the past 7 to 10 days? no . Have you been around anyone who has been exposed to Covid 19, or has mentioned symptoms of Covid 19 within the past 7 to 10 days? no  I reviewed procedure/mask/visitor instructions with patient's daughter (DPR), Santiago Glad, she verbalized understanding, thanked me for call.

## 2019-11-10 ENCOUNTER — Other Ambulatory Visit: Payer: Self-pay

## 2019-11-10 ENCOUNTER — Encounter (HOSPITAL_COMMUNITY)
Admission: RE | Disposition: A | Discharge status: Home / Self Care | Payer: Self-pay | Source: Home / Self Care | Attending: Cardiovascular Disease

## 2019-11-10 ENCOUNTER — Encounter (HOSPITAL_COMMUNITY): Payer: Self-pay | Admitting: General Practice

## 2019-11-10 ENCOUNTER — Observation Stay (HOSPITAL_COMMUNITY)
Admission: RE | Admit: 2019-11-10 | Discharge: 2019-11-11 | Disposition: A | Discharge status: Home / Self Care | Payer: Medicare Other | Attending: Cardiovascular Disease | Admitting: Cardiovascular Disease

## 2019-11-10 DIAGNOSIS — Z955 Presence of coronary angioplasty implant and graft: Secondary | ICD-10-CM | POA: Diagnosis not present

## 2019-11-10 DIAGNOSIS — I350 Nonrheumatic aortic (valve) stenosis: Secondary | ICD-10-CM | POA: Diagnosis not present

## 2019-11-10 DIAGNOSIS — F0390 Unspecified dementia without behavioral disturbance: Secondary | ICD-10-CM | POA: Diagnosis not present

## 2019-11-10 DIAGNOSIS — I4819 Other persistent atrial fibrillation: Secondary | ICD-10-CM | POA: Diagnosis not present

## 2019-11-10 DIAGNOSIS — E039 Hypothyroidism, unspecified: Secondary | ICD-10-CM | POA: Insufficient documentation

## 2019-11-10 DIAGNOSIS — I252 Old myocardial infarction: Secondary | ICD-10-CM | POA: Insufficient documentation

## 2019-11-10 DIAGNOSIS — E785 Hyperlipidemia, unspecified: Secondary | ICD-10-CM | POA: Diagnosis not present

## 2019-11-10 DIAGNOSIS — R233 Spontaneous ecchymoses: Secondary | ICD-10-CM | POA: Diagnosis present

## 2019-11-10 DIAGNOSIS — I2510 Atherosclerotic heart disease of native coronary artery without angina pectoris: Secondary | ICD-10-CM | POA: Diagnosis not present

## 2019-11-10 DIAGNOSIS — G4730 Sleep apnea, unspecified: Secondary | ICD-10-CM | POA: Insufficient documentation

## 2019-11-10 DIAGNOSIS — Z79899 Other long term (current) drug therapy: Secondary | ICD-10-CM | POA: Insufficient documentation

## 2019-11-10 DIAGNOSIS — I10 Essential (primary) hypertension: Secondary | ICD-10-CM | POA: Diagnosis not present

## 2019-11-10 DIAGNOSIS — Z7901 Long term (current) use of anticoagulants: Secondary | ICD-10-CM | POA: Insufficient documentation

## 2019-11-10 HISTORY — PX: RIGHT/LEFT HEART CATH AND CORONARY ANGIOGRAPHY: CATH118266

## 2019-11-10 HISTORY — PX: CARDIAC CATHETERIZATION: SHX172

## 2019-11-10 LAB — POCT I-STAT 7, (LYTES, BLD GAS, ICA,H+H)
Acid-Base Excess: 1 mmol/L (ref 0.0–2.0)
Bicarbonate: 25.5 mmol/L (ref 20.0–28.0)
Calcium, Ion: 1.23 mmol/L (ref 1.15–1.40)
HCT: 37 % (ref 36.0–46.0)
Hemoglobin: 12.6 g/dL (ref 12.0–15.0)
O2 Saturation: 95 %
Potassium: 3.8 mmol/L (ref 3.5–5.1)
Sodium: 141 mmol/L (ref 135–145)
TCO2: 27 mmol/L (ref 22–32)
pCO2 arterial: 38.5 mmHg (ref 32.0–48.0)
pH, Arterial: 7.43 (ref 7.350–7.450)
pO2, Arterial: 74 mmHg — ABNORMAL LOW (ref 83.0–108.0)

## 2019-11-10 LAB — PROTIME-INR
INR: 1.3 — ABNORMAL HIGH (ref 0.8–1.2)
Prothrombin Time: 15.8 seconds — ABNORMAL HIGH (ref 11.4–15.2)

## 2019-11-10 LAB — POCT I-STAT EG7
Bicarbonate: 24.8 mmol/L (ref 20.0–28.0)
Calcium, Ion: 1.17 mmol/L (ref 1.15–1.40)
HCT: 36 % (ref 36.0–46.0)
Hemoglobin: 12.2 g/dL (ref 12.0–15.0)
O2 Saturation: 70 %
Potassium: 3.7 mmol/L (ref 3.5–5.1)
Sodium: 143 mmol/L (ref 135–145)
TCO2: 26 mmol/L (ref 22–32)
pCO2, Ven: 41.6 mmHg — ABNORMAL LOW (ref 44.0–60.0)
pH, Ven: 7.384 (ref 7.250–7.430)
pO2, Ven: 38 mmHg (ref 32.0–45.0)

## 2019-11-10 SURGERY — RIGHT/LEFT HEART CATH AND CORONARY ANGIOGRAPHY
Anesthesia: LOCAL

## 2019-11-10 MED ORDER — METOPROLOL TARTRATE 50 MG PO TABS
50.0000 mg | ORAL_TABLET | Freq: Two times a day (BID) | ORAL | Status: DC
  Administered 2019-11-10 – 2019-11-11 (×2): 50 mg via ORAL
  Filled 2019-11-10 (×2): qty 1

## 2019-11-10 MED ORDER — ATORVASTATIN CALCIUM 80 MG PO TABS
80.0000 mg | ORAL_TABLET | Freq: Every day | ORAL | Status: DC
  Administered 2019-11-10: 80 mg via ORAL
  Filled 2019-11-10: qty 1

## 2019-11-10 MED ORDER — SODIUM CHLORIDE 0.9% FLUSH: 3.0000 mL | Freq: Two times a day (BID) | INTRAVENOUS | Status: DC

## 2019-11-10 MED ORDER — LABETALOL HCL 5 MG/ML IV SOLN: 10.0000 mg | INTRAVENOUS | Status: AC | PRN

## 2019-11-10 MED ORDER — SODIUM CHLORIDE 0.9 % IV SOLN: 250.0000 mL | INTRAVENOUS | Status: DC | PRN

## 2019-11-10 MED ORDER — LIDOCAINE HCL (PF) 1 % IJ SOLN
INTRAMUSCULAR | Status: DC | PRN
  Administered 2019-11-10: 2 mL
  Administered 2019-11-10: 1 mL

## 2019-11-10 MED ORDER — LIDOCAINE HCL (PF) 1 % IJ SOLN
INTRAMUSCULAR | Status: AC
  Filled 2019-11-10: qty 30

## 2019-11-10 MED ORDER — ONDANSETRON HCL 4 MG/2ML IJ SOLN: 4.0000 mg | Freq: Four times a day (QID) | INTRAMUSCULAR | Status: DC | PRN

## 2019-11-10 MED ORDER — ASPIRIN 81 MG PO CHEW: 81.0000 mg | CHEWABLE_TABLET | ORAL | Status: DC

## 2019-11-10 MED ORDER — PANTOPRAZOLE SODIUM 40 MG PO TBEC
40.0000 mg | DELAYED_RELEASE_TABLET | Freq: Every day | ORAL | Status: DC
  Administered 2019-11-11: 40 mg via ORAL
  Filled 2019-11-10: qty 1

## 2019-11-10 MED ORDER — HEPARIN (PORCINE) IN NACL 1000-0.9 UT/500ML-% IV SOLN
INTRAVENOUS | Status: AC
  Filled 2019-11-10: qty 1000

## 2019-11-10 MED ORDER — ACETAMINOPHEN 325 MG PO TABS
650.0000 mg | ORAL_TABLET | ORAL | Status: DC | PRN
  Administered 2019-11-10: 650 mg via ORAL
  Filled 2019-11-10: qty 2

## 2019-11-10 MED ORDER — HEPARIN SODIUM (PORCINE) 1000 UNIT/ML IJ SOLN
INTRAMUSCULAR | Status: AC
  Filled 2019-11-10: qty 1

## 2019-11-10 MED ORDER — SODIUM CHLORIDE 0.9 % WEIGHT BASED INFUSION: 1.0000 mL/kg/h | INTRAVENOUS | Status: DC

## 2019-11-10 MED ORDER — IOHEXOL 350 MG/ML SOLN
INTRAVENOUS | Status: DC | PRN
  Administered 2019-11-10: 09:00:00 100 mL

## 2019-11-10 MED ORDER — SODIUM CHLORIDE 0.9% FLUSH
3.0000 mL | Freq: Two times a day (BID) | INTRAVENOUS | Status: DC
  Administered 2019-11-10 – 2019-11-11 (×2): 3 mL via INTRAVENOUS

## 2019-11-10 MED ORDER — SODIUM CHLORIDE 0.9% FLUSH: 3.0000 mL | INTRAVENOUS | Status: DC | PRN

## 2019-11-10 MED ORDER — HYDRALAZINE HCL 20 MG/ML IJ SOLN: 10.0000 mg | INTRAMUSCULAR | Status: AC | PRN

## 2019-11-10 MED ORDER — LEVOTHYROXINE SODIUM 137 MCG PO TABS
137.0000 ug | ORAL_TABLET | Freq: Every day | ORAL | Status: DC
  Administered 2019-11-11: 137 ug via ORAL
  Filled 2019-11-10: qty 1

## 2019-11-10 MED ORDER — AMLODIPINE BESYLATE 5 MG PO TABS
5.0000 mg | ORAL_TABLET | Freq: Every day | ORAL | Status: DC
  Administered 2019-11-11: 5 mg via ORAL
  Filled 2019-11-10: qty 1

## 2019-11-10 MED ORDER — ALPRAZOLAM 0.25 MG PO TABS
0.2500 mg | ORAL_TABLET | Freq: Every day | ORAL | Status: DC
  Administered 2019-11-10: 0.25 mg via ORAL
  Filled 2019-11-10: qty 1

## 2019-11-10 MED ORDER — SODIUM CHLORIDE 0.9 % WEIGHT BASED INFUSION
3.0000 mL/kg/h | INTRAVENOUS | Status: AC
  Administered 2019-11-10: 3 mL/kg/h via INTRAVENOUS

## 2019-11-10 MED ORDER — SODIUM CHLORIDE 0.9 % IV SOLN: INTRAVENOUS | Status: AC

## 2019-11-10 MED ORDER — CLOPIDOGREL BISULFATE 75 MG PO TABS
75.0000 mg | ORAL_TABLET | Freq: Every day | ORAL | Status: DC
  Administered 2019-11-11: 75 mg via ORAL
  Filled 2019-11-10: qty 1

## 2019-11-10 MED ORDER — VERAPAMIL HCL 2.5 MG/ML IV SOLN
INTRAVENOUS | Status: AC
  Filled 2019-11-10: qty 2

## 2019-11-10 MED ORDER — LISINOPRIL 5 MG PO TABS
5.0000 mg | ORAL_TABLET | Freq: Every day | ORAL | Status: DC
  Administered 2019-11-10 – 2019-11-11 (×2): 5 mg via ORAL
  Filled 2019-11-10 (×2): qty 1

## 2019-11-10 MED ORDER — HEPARIN SODIUM (PORCINE) 1000 UNIT/ML IJ SOLN
INTRAMUSCULAR | Status: DC | PRN
  Administered 2019-11-10: 4000 [IU] via INTRAVENOUS

## 2019-11-10 MED ORDER — HEPARIN (PORCINE) IN NACL 1000-0.9 UT/500ML-% IV SOLN
INTRAVENOUS | Status: DC | PRN
  Administered 2019-11-10 (×2): 500 mL

## 2019-11-10 MED ORDER — VERAPAMIL HCL 2.5 MG/ML IV SOLN
INTRAVENOUS | Status: DC | PRN
  Administered 2019-11-10: 10 mL via INTRA_ARTERIAL

## 2019-11-10 SURGICAL SUPPLY — 14 items
CATH 5FR JL3.5 JR4 ANG PIG MP (CATHETERS) ×2 IMPLANT
CATH BALLN WEDGE 5F 110CM (CATHETERS) ×2 IMPLANT
CATH INFINITI 5FR AL1 (CATHETERS) ×2 IMPLANT
DEVICE RAD COMP TR BAND LRG (VASCULAR PRODUCTS) ×2 IMPLANT
GLIDESHEATH SLEND SS 6F .021 (SHEATH) ×2 IMPLANT
GUIDEWIRE INQWIRE 1.5J.035X260 (WIRE) ×1 IMPLANT
INQWIRE 1.5J .035X260CM (WIRE) ×2
KIT HEART LEFT (KITS) ×2 IMPLANT
PACK CARDIAC CATHETERIZATION (CUSTOM PROCEDURE TRAY) ×2 IMPLANT
SHEATH GLIDE SLENDER 4/5FR (SHEATH) ×2 IMPLANT
SHEATH PROBE COVER 6X72 (BAG) ×2 IMPLANT
TRANSDUCER W/STOPCOCK (MISCELLANEOUS) ×2 IMPLANT
TUBING CIL FLEX 10 FLL-RA (TUBING) ×2 IMPLANT
WIRE EMERALD ST .035X150CM (WIRE) ×2 IMPLANT

## 2019-11-10 NOTE — Progress Notes (Signed)
Called to short stay for right arm post cath bleeding. Tr band loose, and radial cath site bleeding steadily. Site cleaned, and TR band removed and repositioned. Reinflated to 15cc. Patient cannot follow post cath instructions with regard to not moving , using her hand.  Her daughter is present and is concerned that she cannot handle her at home 1. About not using her hand. And 2. Handling her if she starts bleeding at home.  No hematoma present, some bruising distal to tr band.   Bedrest restarts at 12:10:00

## 2019-11-10 NOTE — Progress Notes (Signed)
Pt states she needs to void and can not use pruwick assist to bedside commode to void was able to void. Bleeding noted from TRB 7cc air added back to TRB, Cath Lab called to assess TRB. [place] here to reposition TRB  15cc added to band by Bryon.

## 2019-11-10 NOTE — Interval H&P Note (Signed)
History and Physical Interval Note:  11/10/2019 8:14 AM  [name]  has presented today for surgery, with the diagnosis of aortic stenosis.  The various methods of treatment have been discussed with the patient and family. After consideration of risks, benefits and other options for treatment, the patient has consented to  Procedure(s): RIGHT/LEFT HEART CATH AND CORONARY ANGIOGRAPHY (N/A) as a surgical intervention.  The patient's history has been reviewed, patient examined, no change in status, stable for surgery.  I have reviewed the patient's chart and labs.  Questions were answered to the patient's satisfaction.    Cath Lab Visit (complete for each Cath Lab visit)  Clinical Evaluation Leading to the Procedure:   ACS: No.  Non-ACS:    Anginal Classification: No Symptoms  Anti-ischemic medical therapy: Minimal Therapy (1 class of medications)  Non-Invasive Test Results: No non-invasive testing performed  Prior CABG: No previous CABG         Lauree Chandler

## 2019-11-10 NOTE — Progress Notes (Signed)
Report called to Delsa Sale, RN on 4east.

## 2019-11-10 NOTE — Progress Notes (Signed)
Refused to use purwick assist  Pt to bedside commode to void. Moving right hand frequently small amt of ooze noted from TRB. Instructed pt not to move right hand, but continues to do so.

## 2019-11-10 NOTE — Progress Notes (Signed)
Patient arrived from the cath lab to 4E room 06.  Telemetry monitor applied and CCMD notified.  CHG bath done.  Patient oriented to unit and room to include call light and phone.  Will continue to monitor.

## 2019-11-10 NOTE — Discharge Instructions (Signed)
Radial Site Care ° °This sheet gives you information about how to care for yourself after your procedure. Your health care provider may also give you more specific instructions. If you have problems or questions, contact your health care provider. °What can I expect after the procedure? °After the procedure, it is common to have: °· Bruising and tenderness at the catheter insertion area. °Follow these instructions at home: °Medicines °· Take over-the-counter and prescription medicines only as told by your health care provider. °Insertion site care °· Follow instructions from your health care provider about how to take care of your insertion site. Make sure you: °? Wash your hands with soap and water before you change your bandage (dressing). If soap and water are not available, use hand sanitizer. °? Change your dressing as told by your health care provider. °? Leave stitches (sutures), skin glue, or adhesive strips in place. These skin closures may need to stay in place for 2 weeks or longer. If adhesive strip edges start to loosen and curl up, you may trim the loose edges. Do not remove adhesive strips completely unless your health care provider tells you to do that. °· Check your insertion site every day for signs of infection. Check for: °? Redness, swelling, or pain. °? Fluid or blood. °? Pus or a bad smell. °? Warmth. °· Do not take baths, swim, or use a hot tub until your health care provider approves. °· You may shower 24-48 hours after the procedure, or as directed by your health care provider. °? Remove the dressing and gently wash the site with plain soap and water. °? Pat the area dry with a clean towel. °? Do not rub the site. That could cause bleeding. °· Do not apply powder or lotion to the site. °Activity ° °· For 24 hours after the procedure, or as directed by your health care provider: °? Do not flex or bend the affected arm. °? Do not push or pull heavy objects with the affected arm. °? Do not  drive yourself home from the hospital or clinic. You may drive 24 hours after the procedure unless your health care provider tells you not to. °? Do not operate machinery or power tools. °· Do not lift anything that is heavier than 10 lb (4.5 kg), or the limit that you are told, until your health care provider says that it is safe. °· Ask your health care provider when it is okay to: °? Return to work or school. °? Resume usual physical activities or sports. °? Resume sexual activity. °General instructions °· If the catheter site starts to bleed, raise your arm and put firm pressure on the site. If the bleeding does not stop, get help right away. This is a medical emergency. °· If you went home on the same day as your procedure, a responsible adult should be with you for the first 24 hours after you arrive home. °· Keep all follow-up visits as told by your health care provider. This is important. °Contact a health care provider if: °· You have a fever. °· You have redness, swelling, or yellow drainage around your insertion site. °Get help right away if: °· You have unusual pain at the radial site. °· The catheter insertion area swells very fast. °· The insertion area is bleeding, and the bleeding does not stop when you hold steady pressure on the area. °· Your arm or hand becomes pale, cool, tingly, or numb. °These symptoms may represent a serious problem   that is an emergency. Do not wait to see if the symptoms will go away. Get medical help right away. Call your local emergency services (911 in the [place] Do not drive yourself to the hospital. °Summary °· After the procedure, it is common to have bruising and tenderness at the site. °· Follow instructions from your health care provider about how to take care of your radial site wound. Check the wound every day for signs of infection. °· Do not lift anything that is heavier than 10 lb (4.5 kg), or the limit that you are told, until your health care provider says  that it is safe. °This information is not intended to replace advice given to you by your health care provider. Make sure you discuss any questions you have with your health care provider. °Document Released: 01/18/2011 Document Revised: 01/21/2018 Document Reviewed: 01/21/2018 °Elsevier Patient Education © 2020 [employer] ° °

## 2019-11-10 NOTE — Progress Notes (Addendum)
Continues to move right hand and arm even after instructed not to. Pt daughter [name] called and ask her if she could come sit with her. States that she will. Pt asking to get out of bed to void instructed her we need to try the purwick since she had some ooze noted from her right arm. States she will try. Purwick placed.

## 2019-11-10 NOTE — Progress Notes (Addendum)
TRB removed dressing applied after cleaning area. Splint and arm board applied. Bruises noted below TRB site.

## 2019-11-11 ENCOUNTER — Encounter (HOSPITAL_COMMUNITY): Payer: Self-pay | Admitting: Cardiovascular Disease

## 2019-11-11 DIAGNOSIS — Z7901 Long term (current) use of anticoagulants: Secondary | ICD-10-CM | POA: Diagnosis not present

## 2019-11-11 DIAGNOSIS — Z955 Presence of coronary angioplasty implant and graft: Secondary | ICD-10-CM | POA: Diagnosis not present

## 2019-11-11 DIAGNOSIS — G4730 Sleep apnea, unspecified: Secondary | ICD-10-CM | POA: Diagnosis not present

## 2019-11-11 DIAGNOSIS — I4819 Other persistent atrial fibrillation: Secondary | ICD-10-CM | POA: Diagnosis not present

## 2019-11-11 DIAGNOSIS — I10 Essential (primary) hypertension: Secondary | ICD-10-CM | POA: Diagnosis not present

## 2019-11-11 DIAGNOSIS — E785 Hyperlipidemia, unspecified: Secondary | ICD-10-CM | POA: Diagnosis not present

## 2019-11-11 DIAGNOSIS — Z79899 Other long term (current) drug therapy: Secondary | ICD-10-CM | POA: Diagnosis not present

## 2019-11-11 DIAGNOSIS — E039 Hypothyroidism, unspecified: Secondary | ICD-10-CM | POA: Diagnosis not present

## 2019-11-11 DIAGNOSIS — I252 Old myocardial infarction: Secondary | ICD-10-CM | POA: Diagnosis not present

## 2019-11-11 DIAGNOSIS — I2510 Atherosclerotic heart disease of native coronary artery without angina pectoris: Secondary | ICD-10-CM | POA: Diagnosis not present

## 2019-11-11 DIAGNOSIS — I350 Nonrheumatic aortic (valve) stenosis: Secondary | ICD-10-CM | POA: Diagnosis not present

## 2019-11-11 NOTE — Discharge Summary (Addendum)
Discharge Summary    Patient ID: [name],  MRN: [id number], DOB/AGE: 02-Jul-1933 85 y.o.  Admit date: 11/10/2019 Discharge date: 11/11/2019  Primary Care Provider: Jenna Luo T Primary Cardiologist: Pixie Casino, MD  Discharge Diagnoses    Active Problems:   Severe aortic stenosis   Bleeding into the skin   Allergies Allergies  Allergen Reactions   Fosamax [Alendronate Sodium] Other (See Comments)    Unknown per daughter    Diagnostic Studies/Procedures    Cath: 11/10/19   Prox RCA lesion is 20% stenosed.  Dist RCA lesion is 20% stenosed.  Mid RCA lesion is 20% stenosed.  Dist Cx lesion is 40% stenosed.  Previously placed 1st Mrg stent (unknown type) is widely patent.  Mid LAD lesion is 20% stenosed.   1. Patent stent obtuse marginal branch without restenosis 2. Mild non-obstructive disease in the LAD, Circumflex and RCA 3. Severe aortic valve stenosis (mean gradient 34.8 mmHg, peak to peak gradient 35 mmHg, AVA 0.72 cm2)  Will continue workup for TAVR.   Diagnostic Dominance: Right   _____________   History of Present Illness     85 yo female with history of HTN, hyperlipidemia, hypothyroidism, sleep apnea, dementia, osteoporosis, persistent atrial fibrillation, CAD and severe aortic stenosis who was seen by Dr. Angelena Form as a new consult, referred by Dr. Debara Pickett, for further discussion regarding her aortic stenosis and possible TAVR. She was known to have CAD and had an obtuse marginal treated with a bare metal stent in February 2016. Mild disease in the LAD and RCA. She has persistent atrial fibrillation and has been on coumadin. She has failed previous cardioversion attempts. She has been followed for moderate aortic stenosis for several years. Most recent echo 10/04/19 with LVEF=60-65%. The aortic valve leaflets are thickened with poor leaflet excursion, mean gradient 47 mmHg, peak gradient 64 mmHg, AVA 0.69 cm2, dimensionless index 0.18.    She reported she had been having progressive dyspnea and fatigue. Some dizziness but no syncope. No lower extremity edema. She has some memory issues. Her daughter lives with her in [place]. She worked in a [employer]. She full dentures.   She was set up for outpatient cardiac cath for ongoing TAVR work up.    Hospital Course     Underwent cardiac cath noted above with mild nonobstructive disease with patent stent in OM1. Severe AS with (mean gradient 34.8 mmHg, peak to peak gradient 35 mmHg, AVA 0.72 cm2). Post cath she was unable maintain her radial site and developing some bleeding. Daughter expressed concern over being able to care for her at home. Given this she was admitted overnight for observation. No complications noted overnight as TR band was removed.   [name] was seen by Dr. Burt Knack and determined stable for discharge home. Follow up in the office has been arranged. Medications are listed below.   _____________  Discharge Vitals Blood pressure 108/74, pulse 85, temperature 97.6 F (36.4 C), temperature source Oral, resp. rate 20, height 5\' 2"  (1.575 m), weight 77.1 kg, SpO2 99 %.  Filed Weights   11/10/19 0626  Weight: 77.1 kg    Labs & Radiologic Studies    CBC Recent Labs    11/10/19 0901 11/10/19 0905  HGB 12.2 12.6  HCT 36.0 99991111   Basic Metabolic Panel Recent Labs    11/10/19 0901 11/10/19 0905  NA 143 141  K 3.7 3.8   Liver Function Tests No results for input(s): AST, ALT, ALKPHOS,  BILITOT, PROT, ALBUMIN in the last 72 hours. No results for input(s): LIPASE, AMYLASE in the last 72 hours. Cardiac Enzymes No results for input(s): CKTOTAL, CKMB, CKMBINDEX, TROPONINI in the last 72 hours. BNP Invalid input(s): POCBNP D-Dimer No results for input(s): DDIMER in the last 72 hours. Hemoglobin A1C No results for input(s): HGBA1C in the last 72 hours. Fasting Lipid Panel No results for input(s): CHOL, HDL, LDLCALC, TRIG, CHOLHDL, LDLDIRECT in the  last 72 hours. Thyroid Function Tests No results for input(s): TSH, T4TOTAL, T3FREE, THYROIDAB in the last 72 hours.  Invalid input(s): FREET3 _____________  No results found. Disposition   Pt is being discharged home today in good condition.  Follow-up Plans & Appointments    Follow-up Information    Gaye Pollack, MD Follow up.   Specialty: Cardiothoracic Surgery Why: Please keep scheduled follow up appts regarding your TAVR work up Contact information: [address] [place] [address] [phone]          Discharge Instructions    Call MD for:  redness, tenderness, or signs of infection (pain, swelling, redness, odor or green/yellow discharge around incision site)   Complete by: As directed    Diet - low sodium heart healthy   Complete by: As directed    Discharge instructions   Complete by: As directed    Radial Site Care Refer to this sheet in the next few weeks. These instructions provide you with information on caring for yourself after your procedure. Your caregiver may also give you more specific instructions. Your treatment has been planned according to current medical practices, but problems sometimes occur. Call your caregiver if you have any problems or questions after your procedure. HOME CARE INSTRUCTIONS You may shower the day after the procedure.Remove the bandage (dressing) and gently wash the site with plain soap and water.Gently pat the site dry.  Do not apply powder or lotion to the site.  Do not submerge the affected site in water for 3 to 5 days.  Inspect the site at least twice daily.  Do not flex or bend the affected arm for 24 hours.  No lifting over 5 pounds (2.3 kg) for 5 days after your procedure.  Do not drive home if you are discharged the same day of the procedure. Have someone else drive you.  You may drive 24 hours after the procedure unless otherwise instructed by your caregiver.  What to expect: Any bruising will  usually fade within 1 to 2 weeks.  Blood that collects in the tissue (hematoma) may be painful to the touch. It should usually decrease in size and tenderness within 1 to 2 weeks.  SEEK IMMEDIATE MEDICAL CARE IF: You have unusual pain at the radial site.  You have redness, warmth, swelling, or pain at the radial site.  You have drainage (other than a small amount of blood on the dressing).  You have chills.  You have a fever or persistent symptoms for more than 72 hours.  You have a fever and your symptoms suddenly get worse.  Your arm becomes pale, cool, tingly, or numb.  You have heavy bleeding from the site. Hold pressure on the site.   Increase activity slowly   Complete by: As directed    Increase activity slowly   Complete by: As directed        Discharge Medications     Medication List    TAKE these medications   ALPRAZolam 0.25 MG tablet Commonly known  as: XANAX TAKE 1 TABLET BY MOUTH EVERY DAY AT BEDTIME AS NEEDED FOR SLEEP What changed: See the new instructions.   amLODipine 5 MG tablet Commonly known as: NORVASC TAKE 1 TABLET (5 MG TOTAL) DAILY BY MOUTH.   atorvastatin 80 MG tablet Commonly known as: LIPITOR TAKE 1 TABLET BY MOUTH DAILY X 6PM What changed: See the new instructions.   calcium carbonate 1250 MG capsule Take 1,250 mg by mouth 2 (two) times daily with a meal.   cephALEXin 500 MG capsule Commonly known as: KEFLEX Take 1 capsule (500 mg total) by mouth 3 (three) times daily.   cholecalciferol 1000 units tablet Commonly known as: VITAMIN D Take 1,000 Units by mouth daily.   clopidogrel 75 MG tablet Commonly known as: PLAVIX TAKE 1 TABLET BY MOUTH EVERY DAY   cyanocobalamin 1000 MCG tablet Take 100 mcg by mouth daily.   levothyroxine 137 MCG tablet Commonly known as: SYNTHROID TAKE 1 TABLET BY MOUTH EVERY DAY   lisinopril 5 MG tablet Commonly known as: ZESTRIL Take 1 tablet (5 mg total) by mouth daily.   metoprolol tartrate 50 MG  tablet Commonly known as: LOPRESSOR TAKE 1 TABLET BY MOUTH TWICE A DAY   pantoprazole 40 MG tablet Commonly known as: PROTONIX TAKE 1 TABLET BY MOUTH EVERY DAY   traMADol 50 MG tablet Commonly known as: ULTRAM Take 1 tablet (50 mg total) by mouth every 8 (eight) hours as needed.   warfarin 1 MG tablet Commonly known as: COUMADIN Take as directed. If you are unsure how to take this medication, talk to your nurse or doctor. Original instructions: TAKE 2-3 TABLETS BY MOUTH DAILY AS DIRECTED BY COUMADIN CLINIC        No                               Did the patient have a percutaneous coronary intervention (stent / angioplasty)?:  No.      Outstanding Labs/Studies   N/a   Duration of Discharge Encounter   Greater than 30 minutes including physician time.  Signed, Reino Bellis NP-C 11/11/2019, 11:27 AM   Patient seen, examined. Available data reviewed. Agree with findings, assessment, and plan as outlined by Reino Bellis, NP.  On my exam today this is an elderly woman in no distress.  Lungs are clear, heart is regular rate and rhythm with a 3/6 late peaking harsh systolic murmur at the right upper sternal border, the right radial site is clear with ecchymoses but no firm hematoma.  There is no active bleeding.  Extremities have no edema.  The patient is stable for discharge home and will continue outpatient evaluation for TAVR.  Sherren Mocha, M.D. 11/11/2019 2:14 PM

## 2019-11-16 ENCOUNTER — Other Ambulatory Visit: Payer: Self-pay

## 2019-11-16 ENCOUNTER — Encounter (HOSPITAL_COMMUNITY): Payer: Self-pay

## 2019-11-16 ENCOUNTER — Ambulatory Visit (HOSPITAL_BASED_OUTPATIENT_CLINIC_OR_DEPARTMENT_OTHER)
Admit: 2019-11-16 | Discharge: 2019-11-16 | Disposition: A | Discharge status: Home / Self Care | Payer: Medicare Other | Attending: Cardiovascular Disease | Admitting: Cardiovascular Disease

## 2019-11-16 ENCOUNTER — Ambulatory Visit (HOSPITAL_COMMUNITY)
Admission: RE | Admit: 2019-11-16 | Discharge: 2019-11-16 | Disposition: A | Discharge status: Home / Self Care | Payer: Medicare Other | Source: Ambulatory Visit | Attending: Cardiovascular Disease | Admitting: Cardiovascular Disease

## 2019-11-16 DIAGNOSIS — I350 Nonrheumatic aortic (valve) stenosis: Secondary | ICD-10-CM | POA: Diagnosis not present

## 2019-11-16 DIAGNOSIS — I700 Atherosclerosis of aorta: Secondary | ICD-10-CM | POA: Diagnosis not present

## 2019-11-16 DIAGNOSIS — Z01818 Encounter for other preprocedural examination: Secondary | ICD-10-CM | POA: Diagnosis not present

## 2019-11-16 MED ORDER — IOHEXOL 350 MG/ML SOLN
100.0000 mL | Freq: Once | INTRAVENOUS | Status: AC | PRN
  Administered 2019-11-16: 100 mL via INTRAVENOUS

## 2019-11-16 NOTE — Progress Notes (Signed)
Carotid artery duplex completed.   11/16/2019 9:15 AM Kelby Aline., MHA, RVT, RDCS, RDMS

## 2019-11-17 ENCOUNTER — Ambulatory Visit: Payer: Medicare Other | Attending: Cardiovascular Disease | Admitting: Physical Therapy

## 2019-11-17 ENCOUNTER — Institutional Professional Consult (permissible substitution): Payer: Medicare Other | Admitting: Surgery

## 2019-11-17 ENCOUNTER — Encounter: Payer: Self-pay | Admitting: Surgery

## 2019-11-17 ENCOUNTER — Encounter: Payer: Self-pay | Admitting: Physical Therapy

## 2019-11-17 VITALS — BP 127/77 | HR 74 | Temp 97.6°F | Resp 16 | Ht <= 58 in | Wt 175.0 lb

## 2019-11-17 DIAGNOSIS — R2689 Other abnormalities of gait and mobility: Secondary | ICD-10-CM

## 2019-11-17 DIAGNOSIS — I4819 Other persistent atrial fibrillation: Secondary | ICD-10-CM | POA: Diagnosis not present

## 2019-11-17 DIAGNOSIS — I350 Nonrheumatic aortic (valve) stenosis: Secondary | ICD-10-CM | POA: Diagnosis not present

## 2019-11-17 NOTE — Progress Notes (Signed)
Patient ID: [name], female   DOB: June 29, 1933, 85 y.o.   MRN: [id number]  [facility] REPORT  Referring Provider is Hilty, Nadean Corwin, MD Primary Cardiologist is Pixie Casino, MD PCP is Dennard Schaumann Cammie Mcgee, MD  Chief Complaint  Patient presents with   Aortic Stenosis    TAVR eval ...all required studies completed...PT EVAL completed today before this appointment    HPI:  The patient is an 85 year old woman with a history of hypertension, hyperlipidemia, atrial fibrillation on Coumadin, coronary artery disease status post non-ST segment elevation MI and stenting of an obtuse marginal branch with a bare-metal stent in 2016, dementia, osteoporosis, and aortic stenosis who was referred by Dr. Angelena Form for consideration of TAVR.  She is here today with her daughter who lives with her.  She is not very active and uses a walker to mobilize around her house and out to her front porch where she likes to sit.  She has noted worsening dyspnea and fatigue.  Her daughter reports that this is been present for years but seems to be getting worse.  She has had no chest pain or pressure.  She denies any orthopnea.  She denies lower extremity edema.  She reports some dizziness but no syncope.  Her most recent echo on 10/04/2019 showed severe aortic stenosis with a mean gradient of 47 mmHg and a peak gradient of 64 mmHg.  Aortic valve area of 0.69 cm.  Dimensionless index of 0.18.  Left ventricular ejection fraction was 60 to 65%.  Past Medical History:  Diagnosis Date   A-fib [facility])    a. Dx 01/2015, CHA2DS2VASc = 5-->coumadin started.   CAD (coronary artery disease)    a. 01/2015 NSTEMI/PCI: LM nl, LAD 30p, LCX nondom, 30-40p, 50d, OM1 100p (2.75x16 Rebel BMS), RCA mild diff plaque, EF 55%.   Candida infection, disseminated ([place])    H/O echocardiogram    a. 01/2015 Echo: EF 50-55%, mild LVH, no rwma, mild MR,  mildly dil LA, mod TR, PASP 53 mmHg.   Hyperlipidemia    Hypertension    Hypothyroid    Osteoporosis    Sleep apnea 04/2012   Mild/ AHI 10/CPAP 7cm h2o w/2 L o2    Past Surgical History:  Procedure Laterality Date   CARDIAC CATHETERIZATION  11/10/2019   RIGHT/LEFT HEART CATH AND CORONARY ANGIOGRAPHY    CARDIOVERSION N/A 08/22/2015   Procedure: CARDIOVERSION;  Surgeon: Skeet Latch, MD;  Location: [place];  Service: Cardiovascular;  Laterality: N/A;   KNEE SURGERY     LEFT HEART CATHETERIZATION WITH CORONARY ANGIOGRAM N/A 02/20/2015   Procedure: LEFT HEART CATHETERIZATION WITH CORONARY ANGIOGRAM;  Surgeon: Jolaine Artist, MD;  Location: [facility] CATH LAB;  Service: Cardiovascular;  Laterality: N/A;   PERCUTANEOUS CORONARY STENT INTERVENTION (PCI-S)  02/20/2015   Procedure: PERCUTANEOUS CORONARY STENT INTERVENTION (PCI-S);  Surgeon: Jolaine Artist, MD;  Location: [facility] CATH LAB;  Service: Cardiovascular;;  OM1  (2.75/66mm Rebel)   RIGHT/LEFT HEART CATH AND CORONARY ANGIOGRAPHY N/A 11/10/2019   Procedure: RIGHT/LEFT HEART CATH AND CORONARY ANGIOGRAPHY;  Surgeon: Burnell Blanks, MD;  Location: [place] CV LAB;  Service: Cardiovascular;  Laterality: N/A;    Family History  Problem Relation Age of Onset   Coronary artery disease Father    Diabetes Father    Stroke Mother    Arthritis Mother     Social History   Socioeconomic History   Marital status:  Married    Spouse name: Not on file   Number of children: Not on file   Years of education: Not on file   Highest education level: Not on file  Occupational History   Not on file  Social Needs   Financial resource strain: Not on file   Food insecurity    Worry: Not on file    Inability: Not on file   Transportation needs    Medical: Not on file    Non-medical: Not on file  Tobacco Use   Smoking status: Former Smoker    Quit date: 04/28/1995    Years since quitting: 24.5   Smokeless  tobacco: Never Used  Substance and Sexual Activity   Alcohol use: No    Alcohol/week: 0.0 standard drinks   Drug use: No   Sexual activity: Not on file  Lifestyle   Physical activity    Days per week: Not on file    Minutes per session: Not on file   Stress: Not on file  Relationships   Social connections    Talks on phone: Not on file    Gets together: Not on file    Attends religious service: Not on file    Active member of club or organization: Not on file    Attends meetings of clubs or organizations: Not on file    Relationship status: Not on file   Intimate partner violence    Fear of current or ex partner: Not on file    Emotionally abused: Not on file    Physically abused: Not on file    Forced sexual activity: Not on file  Other Topics Concern   Not on file  Social History Narrative   Not on file    Current Outpatient Medications  Medication Sig Dispense Refill   ALPRAZolam (XANAX) 0.25 MG tablet TAKE 1 TABLET BY MOUTH EVERY DAY AT BEDTIME AS NEEDED FOR SLEEP (Patient taking differently: Take 0.25 mg by mouth at bedtime. ) 30 tablet 1   amLODipine (NORVASC) 5 MG tablet TAKE 1 TABLET (5 MG TOTAL) DAILY BY MOUTH. 90 tablet 1   atorvastatin (LIPITOR) 80 MG tablet TAKE 1 TABLET BY MOUTH DAILY X 6PM (Patient taking differently: Take 80 mg by mouth daily at 6 PM. ) 90 tablet 2   calcium carbonate 1250 MG capsule Take 1,250 mg by mouth 2 (two) times daily with a meal.     cholecalciferol (VITAMIN D) 1000 UNITS tablet Take 1,000 Units by mouth daily.     clopidogrel (PLAVIX) 75 MG tablet TAKE 1 TABLET BY MOUTH EVERY DAY 90 tablet 2   cyanocobalamin 1000 MCG tablet Take 100 mcg by mouth daily.     levothyroxine (SYNTHROID) 137 MCG tablet TAKE 1 TABLET BY MOUTH EVERY DAY 90 tablet 3   lisinopril (ZESTRIL) 5 MG tablet Take 1 tablet (5 mg total) by mouth daily. 30 tablet 9   metoprolol tartrate (LOPRESSOR) 50 MG tablet TAKE 1 TABLET BY MOUTH TWICE A DAY 180  tablet 1   pantoprazole (PROTONIX) 40 MG tablet TAKE 1 TABLET BY MOUTH EVERY DAY 90 tablet 2   traMADol (ULTRAM) 50 MG tablet Take 1 tablet (50 mg total) by mouth every 8 (eight) hours as needed. 30 tablet 0   warfarin (COUMADIN) 1 MG tablet TAKE 2-3 TABLETS BY MOUTH DAILY AS DIRECTED BY COUMADIN CLINIC 240 tablet 1   Current Facility-Administered Medications  Medication Dose Route Frequency Provider Last Rate Last Dose   denosumab (PROLIA)  injection 60 mg  60 mg Subcutaneous Q6 months [name], MD   60 mg at 05/26/19 1055    Allergies  Allergen Reactions   Fosamax [Alendronate Sodium] Other (See Comments)    Unknown per daughter      Review of Systems:   General:  normal appetite, + decreased energy, no weight gain, no weight loss, no fever  Cardiac:  no chest pain with exertion, no chest pain at rest, +SOB with  exertion, + occasional resting SOB, no PND, no orthopnea, no palpitations, no arrhythmia, + atrial fibrillation, no LE edema, + dizzy spells, no syncope  Respiratory:  + shortness of breath, no home oxygen, no productive cough, no dry cough, no bronchitis, no wheezing, no hemoptysis, no asthma, no pain with inspiration or cough, + sleep apnea, no CPAP at night  GI:   no difficulty swallowing, no reflux, no frequent heartburn, no hiatal hernia, no abdominal pain, + constipation, no diarrhea, no hematochezia, no hematemesis, no melena  GU:   no dysuria,  + frequency, no urinary tract infection, no hematuria, no kidney stones, no kidney disease  Vascular:  no pain suggestive of claudication, no pain in feet, no leg cramps, no varicose veins, no DVT, no non-healing foot ulcer  Neuro:   no stroke, no TIA's, no seizures, no headaches, no temporary blindness one eye,  no slurred speech, no peripheral neuropathy, no chronic pain, + instability of gait, + memory/cognitive dysfunction  Musculoskeletal: + arthritis, no joint swelling, no myalgias, + difficulty walking, +  reduced mobility and uses a walker  Skin:   no rash, no itching, no skin infections, no pressure sores or ulcerations  Psych:   + anxiety, no depression, no nervousness, no unusual recent stress  Eyes:   no blurry vision, no floaters, no recent vision changes, + wears glasses for reading.  ENT:   no hearing loss, no loose or painful teeth, + dentures Hematologic:  + easy bruising, no abnormal bleeding, no clotting disorder, no frequent epistaxis  Endocrine:  no diabetes, does not check CBG's at home     Physical Exam:   BP 127/77 (BP Location: Right Arm, Patient Position: Sitting, Cuff Size: Normal)    Pulse 74    Temp 97.6 F (36.4 C)    Resp 16    Ht 4\' 9"  (1.448 m)    Wt 175 lb (79.4 kg)    SpO2 92% Comment: RA   BMI 37.87 kg/m   General:                      Obese, elderly   HEENT:  Unremarkable, NCAT, PERLA, EOMI  Neck:   no JVD, no bruits, no adenopathy   Chest:   clear to auscultation, symmetrical breath sounds, no wheezes, no rhonchi   CV:   RRR, grade III/VI crescendo/decrescendo murmur heard best at RSB,  no diastolic murmur  Abdomen:  soft, non-tender, no masses   Extremities:  warm, well-perfused, pulses palpable in feet, no LE edema  Rectal/GU  Deferred  Neuro:   Grossly non-focal and symmetrical throughout  Skin:   Clean and dry, no rashes, no breakdown   Diagnostic Tests:  ECHOCARDIOGRAM REPORT       Patient Name:   ILISE ALSTROM Date of Exam: 10/04/2019 Medical Rec #:  [id number]        Height:       62.0 in Accession #:    [id number]  Weight:       170.0 lb Date of Birth:  [birth year]         BSA:          1.78 m Patient Age:    67 years         BP:           116/76 mmHg Patient Gender: F                HR:           77 bpm. Exam Location:  [place]  Procedure: 2D Echo, Cardiac Doppler and Color Doppler  Indications:    I35.0 Aortic Stenosis   History:        Patient has prior history of Echocardiogram examinations, most                  recent 10/15/2018. Sleep apnea-CPAP. Coronary artery disease.                 Hypothyroidism. NSTEMI. Atrial Fibrillation.   Sonographer:    Wilford Sports Rodgers-Jones RDCS Referring Phys: [id number] KENNETH C HILTY  IMPRESSIONS    1. Left ventricular ejection fraction, by visual estimation, is 60 to 65%. The left ventricle has normal function. Normal left ventricular size. There is mildly increased left ventricular hypertrophy.  2. Global right ventricle has normal systolic function.The right ventricular size is normal. No increase in right ventricular wall thickness.  3. Left atrial size was mild-moderately dilated.  4. Right atrial size was normal.  5. The mitral valve is normal in structure. No evidence of mitral valve regurgitation. No evidence of mitral stenosis.  6. The tricuspid valve is normal in structure. Tricuspid valve regurgitation is mild.  7. Aortic valve mean gradient measures 40.5 mmHg.  8. Aortic valve peak gradient measures 60.2 mmHg.  9. The aortic valve is tricuspid Aortic valve regurgitation was not visualized by color flow Doppler. Severe aortic valve stenosis. 10. Peak aortic velocity 70m/s. Mean 78mmHg. Severe AS. 11. The pulmonic valve was normal in structure. Pulmonic valve regurgitation is mild by color flow Doppler. 12. Moderately elevated pulmonary artery systolic pressure. 13. The inferior vena cava is normal in size with greater than 50% respiratory variability, suggesting right atrial pressure of 3 mmHg.  FINDINGS  Left Ventricle: Left ventricular ejection fraction, by visual estimation, is 60 to 65%. The left ventricle has normal function. There is mildly increased left ventricular hypertrophy. Normal left ventricular size.  Right Ventricle: The right ventricular size is normal. No increase in right ventricular wall thickness. Global RV systolic function is has normal systolic function. The tricuspid regurgitant velocity is 2.77 m/s, and with an assumed right  atrial pressure  of 10 mmHg, the estimated right ventricular systolic pressure is moderately elevated at 40.7 mmHg.  Left Atrium: Left atrial size was mild-moderately dilated.  Right Atrium: Right atrial size was normal in size  Pericardium: There is no evidence of pericardial effusion.  Mitral Valve: The mitral valve is normal in structure. No evidence of mitral valve stenosis by observation. No evidence of mitral valve regurgitation.  Tricuspid Valve: The tricuspid valve is normal in structure. Tricuspid valve regurgitation is mild by color flow Doppler.  Aortic Valve: The aortic valve is tricuspid. Aortic valve regurgitation was not visualized by color flow Doppler. Severe aortic stenosis is present. Aortic valve mean gradient measures 40.5 mmHg. Aortic valve peak gradient measures 60.2 mmHg. Aortic  valve area, by VTI measures 0.69 cm. Peak aortic velocity 4m/s.  Mean 12mmHg. Severe AS.  Pulmonic Valve: The pulmonic valve was normal in structure. Pulmonic valve regurgitation is mild by color flow Doppler.  Aorta: The aortic root, ascending aorta and aortic arch are all structurally normal, with no evidence of dilitation or obstruction.  Venous: The inferior vena cava is normal in size with greater than 50% respiratory variability, suggesting right atrial pressure of 3 mmHg.  IAS/Shunts: No atrial level shunt detected by color flow Doppler. No ventricular septal defect is seen or detected. There is no evidence of an atrial septal defect.     LEFT VENTRICLE PLAX 2D LVIDd:         4.20 cm  Diastology LVIDs:         3.00 cm  LV e' lateral:   11.30 cm/s LV PW:         1.20 cm  LV E/e' lateral: 10.1 LV IVS:        1.10 cm  LV e' medial:    10.30 cm/s LVOT diam:     2.20 cm  LV E/e' medial:  11.1 LV SV:         44 ml LV SV Index:   23.36 LVOT Area:     3.80 cm    RIGHT VENTRICLE RV Basal diam:  3.10 cm RV S prime:     10.10 cm/s TAPSE (M-mode): 1.9 cm  LEFT  ATRIUM             Index       RIGHT ATRIUM           Index LA diam:        5.30 cm 2.97 cm/m  RA Area:     19.50 cm LA Vol (A2C):   91.6 ml 51.34 ml/m RA Volume:   57.40 ml  32.17 ml/m LA Vol (A4C):   73.3 ml 41.09 ml/m LA Biplane Vol: 83.2 ml 46.63 ml/m  AORTIC VALVE AV Area (Vmax):    0.61 cm AV Area (Vmean):   0.57 cm AV Area (VTI):     0.69 cm AV Vmax:           388.00 cm/s AV Vmean:          304.500 cm/s AV VTI:            0.922 m AV Peak Grad:      60.2 mmHg AV Mean Grad:      40.5 mmHg LVOT Vmax:         61.83 cm/s LVOT Vmean:        45.800 cm/s LVOT VTI:          0.167 m LVOT/AV VTI ratio: 0.18   AORTA Ao Root diam: 2.60 cm Ao Asc diam:  3.30 cm  MITRAL VALVE                         TRICUSPID VALVE MV Area (PHT): 3.99 cm              TR Peak grad:   30.7 mmHg MV PHT:        55.10 msec            TR Vmax:        296.00 cm/s MV Decel Time: 190 msec MV E velocity: 114.00 cm/s 103 cm/s  SHUNTS MV A velocity: 34.50 cm/s  70.3 cm/s Systemic VTI:  0.17 m MV E/A ratio:  3.30        1.5  Systemic Diam: 2.20 cm    Candee Furbish MD Electronically signed by Candee Furbish MD Signature Date/Time: 10/04/2019/12:50:29 PM   Physicians  Panel Physicians Referring Physician Case Authorizing Physician  Burnell Blanks, MD (Primary)    Procedures  RIGHT/LEFT HEART CATH AND CORONARY ANGIOGRAPHY  Conclusion    Prox RCA lesion is 20% stenosed.  Dist RCA lesion is 20% stenosed.  Mid RCA lesion is 20% stenosed.  Dist Cx lesion is 40% stenosed.  Previously placed 1st Mrg stent (unknown type) is widely patent.  Mid LAD lesion is 20% stenosed.   1. Patent stent obtuse marginal branch without restenosis 2. Mild non-obstructive disease in the LAD, Circumflex and RCA 3. Severe aortic valve stenosis (mean gradient 34.8 mmHg, peak to peak gradient 35 mmHg, AVA 0.72 cm2)  Will continue workup for TAVR.    Recommendations  Antiplatelet/Anticoag Continue  workup for TAVR  Indications  Severe aortic stenosis [I35.0 (ICD-10-CM)]  Procedural Details  Technical Details Indication: Severe aortic stenosis, prior bare metal stent OM  Procedure: The risks, benefits, complications, treatment options, and expected outcomes were discussed with the patient. The patient and/or family concurred with the proposed plan, giving informed consent. The patient was brought to the cath lab after IV hydration was given. The patient was not sedated. The IV catheter present in the right antecubital vein was changed for a 5 [place] sheath. Right heart catheterization performed with a balloon tipped catheter. The right wrist was prepped and draped in a sterile fashion. 1% lidocaine was used for local anesthesia. Using the modified Seldinger access technique, a 5 French sheath was placed in the right radial artery using u/s guidance. 3 mg Verapamil was given through the sheath. 4000 units IV heparin was given. Standard diagnostic catheters were used to perform selective coronary angiography. I crossed the aortic valve with an AL-1 catheter and a straight wire. The sheath was removed from the right radial artery and a Terumo hemostasis band was applied at the arteriotomy site on the right wrist.    Estimated blood loss <50 mL.   During this procedure no sedation was administered.  Medications (Filter: Administrations occurring from 11/10/19 0826 to 11/10/19 0938) (important)  Continuous medications are totaled by the amount administered until 11/10/19 0938.  Medication Rate/Dose/Volume Action  Date Time   Heparin (Porcine) in NaCl 1000-0.9 UT/500ML-% SOLN (mL) 500 mL Given 11/10/19 0842   Total dose as of 11/10/19 0938 500 mL Given 0842   1,000 mL        lidocaine (PF) (XYLOCAINE) 1 % injection (mL) 1 mL Given 11/10/19 0853   Total dose as of 11/10/19 0938 2 mL Given 0859   3 mL        Radial Cocktail/Verapamil only (mL) 10 mL Given 11/10/19 0901   Total dose as of  11/10/19 0938        10 mL        heparin injection (Units) 4,000 Units Given 11/10/19 0904   Total dose as of 11/10/19 0938        4,000 Units        iohexol (OMNIPAQUE) 350 MG/ML injection (mL) 100 mL Given 11/10/19 0929   Total dose as of 11/10/19 0938        100 mL        Contrast  Medication Name Total Dose  iohexol (OMNIPAQUE) 350 MG/ML injection 100 mL    Radiation/Fluoro  Fluoro time: 8.5 (min) DAP: 19637 (mGycm2) Cumulative Air Kerma: 341 (mGy)  Complications  Complications documented before study signed (11/10/2019 10:13 AM)   RIGHT/LEFT HEART CATH AND CORONARY ANGIOGRAPHY  None Documented by Burnell Blanks, MD 11/10/2019 9:29 AM  Date Found: 11/10/2019  Time Range: Intraprocedure     None Documented by Burnell Blanks, MD 11/10/2019 9:29 AM  Date Found: 11/10/2019  Time Range: Intraprocedure      Coronary Findings  Diagnostic Dominance: Right Left Anterior Descending  Vessel is large.  Mid LAD lesion 20% stenosed  Mid LAD lesion is 20% stenosed.  Left Circumflex  Vessel is large.  Dist Cx lesion 40% stenosed  Dist Cx lesion is 40% stenosed.  First Obtuse Marginal Branch  Vessel is moderate in size.  1st Mrg lesion 0% stenosed  Previously placed 1st Mrg stent (unknown type) is widely patent.  Right Coronary Artery  Vessel is large.  Prox RCA lesion 20% stenosed  Prox RCA lesion is 20% stenosed.  Mid RCA lesion 20% stenosed  Mid RCA lesion is 20% stenosed.  Dist RCA lesion 20% stenosed  Dist RCA lesion is 20% stenosed.  Intervention  No interventions have been documented. Coronary Diagrams  Diagnostic Dominance: Right  Intervention  Implants   No implant documentation for this case.  Syngo Images  Show images for CARDIAC CATHETERIZATION  Images on Long Term Storage  Show images for Sadielynn, Onion to Procedure Log  Procedure Log    Hemo Data   Most Recent Value  Fick Cardiac Output 5.28 L/min    Fick Cardiac Output Index 2.96 (L/min)/BSA  Aortic Mean Gradient 34.85 mmHg  Aortic Peak Gradient 35 mmHg  Aortic Valve Area 0.72  Aortic Value Area Index 0.41 cm2/BSA  RA A Wave 8 mmHg  RA V Wave 9 mmHg  RA Mean 8 mmHg  RV Systolic Pressure 48 mmHg  RV Diastolic Pressure 3 mmHg  RV EDP 8 mmHg  PA Systolic Pressure 47 mmHg  PA Diastolic Pressure 18 mmHg  PA Mean 30 mmHg  PW A Wave 19 mmHg  PW V Wave 19 mmHg  PW Mean 19 mmHg  AO Systolic Pressure [id number] mmHg  AO Diastolic Pressure 72 mmHg  AO Mean 96 mmHg  LV Systolic Pressure [id number] mmHg  LV Diastolic Pressure 10 mmHg  LV EDP 16 mmHg  AOp Systolic Pressure [id number] mmHg  AOp Diastolic Pressure 66 mmHg  AOp Mean Pressure 94 mmHg  LVp Systolic Pressure 123XX123 mmHg  LVp Diastolic Pressure 10 mmHg  LVp EDP Pressure 16 mmHg  QP/QS 1  TPVR Index 10.13 HRUI  TSVR Index 32.41 HRUI  PVR SVR Ratio 0.14  TPVR/TSVR Ratio 0.31   ADDENDUM REPORT: 11/16/2019 19:52  CLINICAL DATA:  85 year old female with severe aortic stenosis being evaluated for a TAVR procedure.  EXAM: Cardiac TAVR CT  TECHNIQUE: The patient was scanned on a [employer]. A 120 kV retrospective scan was triggered in the descending thoracic aorta at 111 HU's. Gantry rotation speed was 250 msecs and collimation was .6 mm. No beta blockade or nitro were given. The 3D data set was reconstructed in 5% intervals of the R-R cycle. Systolic and diastolic phases were analyzed on a dedicated work station using MPR, MIP and VRT modes. The patient received 80 cc of contrast.  FINDINGS: Aortic Valve: Trileaflet aortic valve with moderately thickened and calcified leaflets and no calcifications extending into the LVOT.  Aorta: Normal size with mild diffuse atherosclerotic plaque and no dissection.  Sinotubular Junction: 27 x 26 mm  Ascending  Thoracic Aorta: 32 x 31 mm  Aortic Arch: 25 x 24 mm  Descending Thoracic Aorta: 22 x 22 mm  Sinus of Valsalva  Measurements:  Non-coronary: 30 mm  Right -coronary: 30 mm  Left -coronary: 32 mm  Coronary Artery Height above Annulus:  Left Main: 11 mm  Right Coronary: 17 mm  Virtual Basal Annulus Measurements:  Maximum/Minimum Diameter: 26.4 x 20.7 mm  Mean Diameter: 23.4 mm  Perimeter: 75.5 mm  Area: 432 mm2  Optimum Fluoroscopic Angle for Delivery: LAO 35 CAU 23  IMPRESSION: 1. Trileaflet aortic valve with moderately thickened and calcified leaflets and no calcifications extending into the LVOT. Aortic valve calcium score 2044 consistent with severe aortic stenosis. Annular measurements are borderline between 23 and 26 mm Edwards-SAPIEN 3 Ultra valve.  2. Sufficient coronary to annulus distance.  3. Optimum Fluoroscopic Angle for Delivery: LAO 35 CAU 23  4. No thrombus in the left atrial appendage.  5. Dilated pulmonary artery measuring 34 mm.   Electronically Signed   By: Ena Dawley   On: 11/16/2019 19:52      CLINICAL DATA:  Severe symptomatic aortic stenosis. Pre-TAVR evaluation.  EXAM: CT ANGIOGRAPHY CHEST  CTA ABDOMEN AND PELVIS WITH CONTRAST  TECHNIQUE: Multidetector CT imaging through the chest was performed using the standard protocol during bolus administration of intravenous contrast. Multiplanar reconstructed images and MIPs were obtained and reviewed to evaluate the vascular anatomy.  Multidetector CT imaging of the abdomen and pelvis was performed using the standard protocol before and during bolus administration of intravenous contrast. Multiplanar reconstructed images and MIPs were obtained and reviewed to evaluate the vascular anatomy.  CONTRAST:  112mL OMNIPAQUE IOHEXOL 350 MG/ML SOLN  COMPARISON:  07/10/2019 CT abdomen/pelvis. 02/17/2015 chest CT angiogram.  FINDINGS: CTA CHEST FINDINGS  Cardiovascular: Moderate cardiomegaly. No significant pericardial effusion/thickening. Diffuse thickening and  calcification of the aortic valve. Three-vessel coronary atherosclerosis. Atherosclerotic nonaneurysmal thoracic aorta. Top-normal caliber main pulmonary artery (3.3 cm diameter). No central pulmonary emboli.  Mediastinum/Nodes: Dominant calcified 1.2 cm left thyroid nodule. Unremarkable esophagus. No axillary adenopathy. Mildly enlarged 1.3 cm subcarinal node (series 15/image 209), stable since 02/17/2015 chest CT. No new pathologically enlarged mediastinal nodes. No hilar adenopathy.  Lungs/Pleura: No pneumothorax. No pleural effusion. Mosaic attenuation throughout both lungs. Diffuse bronchial wall thickening. No acute consolidative airspace disease, lung masses or significant pulmonary nodules.  Musculoskeletal: No aggressive appearing focal osseous lesions. Moderate thoracic spondylosis. Intervertebral disc ankylosis in the lower thoracic spine.  CTA ABDOMEN AND PELVIS FINDINGS  Hepatobiliary: Normal liver with no liver mass. Cholelithiasis. No biliary ductal dilatation.  Pancreas: Normal, with no mass or duct dilation.  Spleen: Normal size. No mass.  Adrenals/Urinary Tract: Normal adrenals. No hydronephrosis. Parapelvic renal cysts in both kidneys. Exophytic simple 1.0 cm lower left renal cortical cyst. Additional scattered subcentimeter hypodense renal cortical lesions in both kidneys are too small to characterize and require no follow-up. Normal bladder.  Stomach/Bowel: Normal non-distended stomach. Normal caliber small bowel with no small bowel wall thickening. Normal appendix. Mild sigmoid diverticulosis, with no large bowel wall thickening or significant pericolonic fat stranding.  Vascular/Lymphatic: Atherosclerotic nonaneurysmal abdominal aorta. Patent portal, splenic and renal veins. No pathologically enlarged lymph nodes in the abdomen or pelvis.  Reproductive: Normal uterus. No discrete adnexal mass. Mild asymmetric soft tissue prominence in the  right adnexa is unchanged since 2005 CT, considered benign.  Other: No pneumoperitoneum, ascites or focal fluid collection.  Musculoskeletal: No aggressive appearing focal osseous lesions. Marked lumbar spondylosis.  VASCULAR MEASUREMENTS PERTINENT TO TAVR:  AORTA:  Minimal Aortic Diameter-14.3 x 14.2 mm  Severity of Aortic Calcification-moderate  RIGHT PELVIS:  Right Common Iliac Artery -  Minimal Diameter-7.3 x 6.3 mm  Tortuosity-mild  Calcification-moderate to severe  Right External Iliac Artery -  Minimal Diameter-7.5 x 7.4 mm  Tortuosity-moderate to severe  Calcification-none  Right Common Femoral Artery -  Minimal Diameter-7.0 x 5.4 mm  Tortuosity-mild  Calcification-mild  LEFT PELVIS:  Left Common Iliac Artery -  Minimal Diameter-9.1 x 7.9 mm  Tortuosity-moderate  Calcification-moderate  Left External Iliac Artery -  Minimal Diameter-8.6 x 8.3 mm  Tortuosity-moderate  Calcification-none  Left Common Femoral Artery -  Minimal Diameter-8.3 x 7.9 mm  Tortuosity-mild  Calcification-mild  Review of the MIP images confirms the above findings.  IMPRESSION: 1. Vascular findings and measurements pertinent to potential TAVR procedure, as detailed. 2. Diffuse thickening and calcification of the aortic valve, compatible with a reported history of severe symptomatic aortic stenosis. 3. Moderate cardiomegaly.  Three-vessel coronary atherosclerosis. 4. Mosaic attenuation throughout both lungs, nonspecific, which could be due to air trapping from small airways disease or mosaic perfusion from pulmonary vascular disease. 5. Cholelithiasis. 6. Mild sigmoid diverticulosis. 7. Aortic Atherosclerosis (ICD10-I70.0).   Electronically Signed   By: Ilona Sorrel M.D.   On: 11/16/2019 13:59  STS Risk Score:  AVR Risk of Mortality: 5.267% Renal Failure: 3.964% Permanent Stroke: 3.824% Prolonged  Ventilation: 11.136% DSW Infection: 0.088% Reoperation: 4.169% Morbidity or Mortality: 17.314% Short Length of Stay: 18.078% Long Length of Stay: 7.981%   Impression:  This 85 year old woman has stage D, severe, symptomatic aortic stenosis with New York Heart Association class III symptoms of exertional fatigue and shortness of breath consistent with chronic diastolic congestive heart failure.  She is chronically deconditioned and not very active due to obesity and arthritis, requiring a walker for ambulation.  I have personally reviewed her 2D echocardiogram, cardiac catheterization, and CTA studies.  Her echocardiogram shows a trileaflet aortic valve with severe calcification and restricted mobility with a mean gradient of 40.5 mmHg and a peak gradient of 60.2 mmHg.  Left ventricular systolic function is normal.  Cardiac catheterization showed a patent stent in the obtuse marginal branch and otherwise mild nonobstructive disease.  The mean gradient across aortic valve was measured at 35 mmHg with a calculated valve area of 0.72 cm.  It is difficult to tell how significant her symptoms are due to her decreased mobility and some dementia but her daughter said that she does get short of breath with walking in the house and sometimes at rest.  I do not think she would be a candidate for open surgical aortic valve replacement for transcatheter aortic valve replacement seems to be a reasonable alternative.  Her gated cardiac CTA shows anatomy suitable for transcatheter aortic valve replacement using a Sapien 3 valve.  Her abdominal and pelvic CTA shows adequate pelvic vascular anatomy to allow transfemoral insertion.  The patient and her daughter were counseled at length regarding treatment alternatives for management of severe symptomatic aortic stenosis. The risks and benefits of surgical intervention has been discussed in detail. Long-term prognosis with medical therapy was discussed. Alternative  approaches such as conventional surgical aortic valve replacement, transcatheter aortic valve replacement, and palliative medical therapy were compared and contrasted at length. This discussion was placed in the context of the patient's own specific clinical presentation and past medical history. All of their questions have been addressed.   Following the decision to proceed with  transcatheter aortic valve replacement, a discussion was held regarding what types of management strategies would be attempted intraoperatively in the event of life-threatening complications, including whether or not the patient would be considered a candidate for the use of cardiopulmonary bypass and/or conversion to open sternotomy for attempted surgical intervention.  I do not think she would be a candidate for emergent sternotomy to manage any intraoperative complications.  The patient has been advised of a variety of complications that might develop including but not limited to risks of death, stroke, paravalvular leak, aortic dissection or other major vascular complications, aortic annulus rupture, device embolization, cardiac rupture or perforation, mitral regurgitation, acute myocardial infarction, arrhythmia, heart block or bradycardia requiring permanent pacemaker placement, congestive heart failure, respiratory failure, renal failure, pneumonia, infection, other late complications related to structural valve deterioration or migration, or other complications that might ultimately cause a temporary or permanent loss of functional independence or other long term morbidity. The patient and her daughter provide full informed consent for the procedure as described and all questions were answered.     Plan:  She will be scheduled for transfemoral transcatheter aortic valve placement on Tuesday, 11/30/2019.  She will need to have her Coumadin stopped 5 days preoperatively.  I spent 60 minutes performing this consultation and >  50% of this time was spent face to face counseling and coordinating the care of this patient's severe symptomatic aortic stenosis.      Gaye Pollack, MD 11/17/2019

## 2019-11-17 NOTE — Therapy (Signed)
[place], [place], [address] Phone: [phone]   Fax:  [phone]  Physical Therapy Evaluation  Patient Details  Name: [name] MRN: [id number] Date of Birth: 05/10/1933 Referring Provider (PT): Dr Loran Senters    Encounter Date: 11/17/2019  PT End of Session - 11/17/19 1227    Visit Number  1    Number of Visits  1    Date for PT Re-Evaluation  11/17/19    Authorization Type  [employer] MCR    PT Start Time  1050    PT Stop Time  1127    PT Time Calculation (min)  37 min    Activity Tolerance  Patient tolerated treatment well    Behavior During Therapy  [facility] for tasks assessed/performed       Past Medical History:  Diagnosis Date  . A-fib ([place])    a. Dx 01/2015, CHA2DS2VASc = 5-->coumadin started.  Marland Kitchen CAD (coronary artery disease)    a. 01/2015 NSTEMI/PCI: LM nl, LAD 30p, LCX nondom, 30-40p, 50d, OM1 100p (2.75x16 Rebel BMS), RCA mild diff plaque, EF 55%.  . Candida infection, disseminated ([place])   . H/O echocardiogram    a. 01/2015 Echo: EF 50-55%, mild LVH, no rwma, mild MR, mildly dil LA, mod TR, PASP 53 mmHg.  Marland Kitchen Hyperlipidemia   . Hypertension   . Hypothyroid   . Osteoporosis   . Sleep apnea 04/2012   Mild/ AHI 10/CPAP 7cm h2o w/2 L o2    Past Surgical History:  Procedure Laterality Date  . CARDIAC CATHETERIZATION  11/10/2019   RIGHT/LEFT HEART CATH AND CORONARY ANGIOGRAPHY   . CARDIOVERSION N/A 08/22/2015   Procedure: CARDIOVERSION;  Surgeon: Skeet Latch, MD;  Location: [facility];  Service: Cardiovascular;  Laterality: N/A;  . KNEE SURGERY    . LEFT HEART CATHETERIZATION WITH CORONARY ANGIOGRAM N/A 02/20/2015   Procedure: LEFT HEART CATHETERIZATION WITH CORONARY ANGIOGRAM;  Surgeon: Jolaine Artist, MD;  Location: [facility] CATH LAB;  Service: Cardiovascular;  Laterality: N/A;  . PERCUTANEOUS CORONARY STENT INTERVENTION (PCI-S)  02/20/2015   Procedure: PERCUTANEOUS CORONARY  STENT INTERVENTION (PCI-S);  Surgeon: Jolaine Artist, MD;  Location: [facility] CATH LAB;  Service: Cardiovascular;;  OM1  (2.75/61mm Rebel)  . RIGHT/LEFT HEART CATH AND CORONARY ANGIOGRAPHY N/A 11/10/2019   Procedure: RIGHT/LEFT HEART CATH AND CORONARY ANGIOGRAPHY;  Surgeon: Burnell Blanks, MD;  Location: [facility] CV LAB;  Service: Cardiovascular;  Laterality: N/A;    There were no vitals filed for this visit.   Subjective Assessment - 11/17/19 1055    Subjective  Over the past year the patient has been becoming more and more short of breath with activity. She has pain from the back of her left leg into her buttock. She has a history of falls but it has been a while. She uses a walker for primary mobility.    Patient is accompained by:  Family member   daughter primary historian   Pertinent History  osteoperosis, CAD    Currently in Pain?  Yes   has alzhiemers will use faces pain scale   Pain Score  6    per faces pain scale with gait   Pain Location  Leg    Pain Orientation  Left    Pain Descriptors / Indicators  Aching    Pain Type  Chronic pain    Pain Radiating Towards  from buttock to knee    Pain Onset  More than a month  ago    Aggravating Factors   standing and walking    Pain Relieving Factors  rest    Effect of Pain on Daily Activities  difficulty perfroming daily activity         [facility] PT Assessment - 11/17/19 0001      Assessment   Medical Diagnosis  Severe Aortic Stenosis     Referring Provider (PT)  Dr Loran Senters     Onset Date/Surgical Date  --   1 year      Precautions   Precautions  None      Balance Screen   Has the patient fallen in the past 6 months  Yes    How many times?  1    in July. Has not fallen since    Has the patient had a decrease in activity level because of a fear of falling?   No    Is the patient reluctant to leave their home because of a fear of falling?   No      Home Environment   Additional Comments  Lives with  daughter       Prior Function   Level of Independence  Independent    Vocation  Retired      Associate Professor   Overall Cognitive Status  History of cognitive impairments - at baseline   Alzheimers: very pleasent    Memory  Impaired    Memory Impairment  Decreased long term memory;Decreased recall of new information    Decreased Long Term Memory  Verbal basic      Sensation   Light Touch  Appears Intact      Coordination   Gross Motor Movements are Fluid and Coordinated  Yes    Fine Motor Movements are Fluid and Coordinated  No      Posture/Postural Control   Posture Comments  rounded shoulders, forward head, flexed trunk       ROM / Strength   AROM / PROM / Strength  Strength      Strength   Strength Assessment Site  Hand    Right/Left hand  Right;Left    Right Hand Grip (lbs)  15    Left Hand Grip (lbs)  5      Ambulation/Gait   Gait Comments  decreased single leg stance time on the left. increased pain in posterior left knee with gait.        [facility] Pre-Surgical Assessment - 11/17/19 0001    5 Meter Walk Test- trial 1  12 sec    5 Meter Walk Test- trial 2  12 sec.     5 Meter Walk Test- trial 3  13 sec.    5 meter walk test average  12.33 sec    4 Stage Balance Test tolerated for:   10 sec.    4 Stage Balance Test Position  2    comment  could maintain tandem stance     Sit To Stand Test- trial 1  0 sec.    Comment  unable to stand without UE assist andgaurding    ADL/IADL Independent with:  Bathing;Dressing    ADL/IADL Needs Assistance with:  Meal prep    ADL/IADL Fraility Index  Vulnerable    6 Minute Walk- Baseline  yes    BP (mmHg)  120/74    HR (bpm)  66    02 Sat (%RA)  98 %    Modified Borg Scale for Dyspnea  1- Very mild shortness of breath  Perceived Rate of Exertion (Borg)  6-    6 Minute Walk Post Test  yes    BP (mmHg)  154/86    HR (bpm)  87    02 Sat (%RA)  82 %   came back up after a 2 min seated rest break    Modified Borg Scale for Dyspnea  2-  Mild shortness of breath    Perceived Rate of Exertion (Borg)  13- Somewhat hard    Aerobic Endurance Distance Walked  210    Endurance additional comments  91% disability  2 seated rest breaks               Objective measurements completed on examination: See above findings.              PT Education - 11/17/19 1221    Education Details  reviewed HEP and symptom management    Person(s) Educated  Patient    Methods  Explanation;Demonstration;Tactile cues;Verbal cues    Comprehension  Verbalized understanding;Returned demonstration;Verbal cues required;Tactile cues required                  Plan - 11/17/19 1229    Clinical Impression Statement  see below    Clinical Decision Making  Low    PT Frequency  One time visit    PT Treatment/Interventions  Patient/family education    Consulted and Agree with Plan of Care  Patient       Patient will benefit from skilled therapeutic intervention in order to improve the following deficits and impairments:  Abnormal gait, Decreased endurance  Visit Diagnosis: Other abnormalities of gait and mobility   Clinical Impression Statement: Pt is a 85 yo female presenting to OP PT for evaluation prior to possible TAVR surgery due to severe aortic stenosis. Pt reports onset of dyspne approximately 12 months ago. Symptoms are limiting ability to walk community distances. Pt presents with decreased ROM and strength, decreased balance and is at high fall risk 4 stage balance test, decreased walking speed and decreased aerobic endurance per 6 minute walk test. Pt ambulated 110 feet in 2 before requesting a seated rest beak lasting 2:06. At time of rest, patient's HR was 88 bpm and O2 was 88 on room air. Pt reported 3/10 shortness of breath on modified scale for dyspnea. Pt able to resume after rest and ambulate an additional 110 feet. Pt ambulated a total of 220 feet in 6 minute walk. She was limited by left LE pain. Final sao2 noted  at 82% but increased with seated rest break of 2 minutes. B/P  increased significantly with 6 minute walk test. Based on the Short Physical Performance Battery, patient has a frailty rating of 2/12 with </= 5/12 considered frail.    Problem List Patient Active Problem List   Diagnosis Date Noted  . Bleeding into the skin 11/10/2019  . Severe aortic stenosis   . Dementia ([place]) 09/16/2018  . Murmur 10/03/2016  . Aortic valve stenosis 10/03/2016  . Encounter for therapeutic drug monitoring 02/24/2015  . Hypothyroidism 02/21/2015  . Persistent atrial fibrillation ([place])   . CAD (coronary artery disease)   . Hypothyroid   . Candida infection, disseminated ([place])   . Ischemic chest pain ([place])   . NSTEMI, initial episode of care [facility])   . Hyperlipidemia   . Essential hypertension   . Osteoporosis   . Sleep apnea 04/29/2012    Carney Living PT DPT  11/17/2019, 12:41 PM  [place]  [facility] [address] [place], [place], [address] Phone: [phone]   Fax:  [phone]  Name: IVA BJORKMAN MRN: [id number] Date of Birth: 01-28-1933

## 2019-11-19 ENCOUNTER — Other Ambulatory Visit: Payer: Self-pay

## 2019-11-19 ENCOUNTER — Ambulatory Visit: Payer: Medicare Other | Admitting: *Deleted

## 2019-11-19 DIAGNOSIS — I4819 Other persistent atrial fibrillation: Secondary | ICD-10-CM | POA: Diagnosis not present

## 2019-11-19 DIAGNOSIS — Z5181 Encounter for therapeutic drug level monitoring: Secondary | ICD-10-CM | POA: Diagnosis not present

## 2019-11-19 DIAGNOSIS — I350 Nonrheumatic aortic (valve) stenosis: Secondary | ICD-10-CM

## 2019-11-19 LAB — POCT INR: INR: 1.8 — AB (ref 2.0–3.0)

## 2019-11-19 NOTE — Patient Instructions (Addendum)
Description   Take 3 tablets today, then continue taking 2 tablets daily except for 1 tablet on tuesdays. Hold Coumadin on 11/26 until 12/1 they day of your procedure when you are admitted to the hospital. Please call [phone], for any changes in medications or up coming procedures.

## 2019-11-26 ENCOUNTER — Other Ambulatory Visit (HOSPITAL_COMMUNITY)
Admission: RE | Admit: 2019-11-26 | Discharge: 2019-11-26 | Disposition: A | Discharge status: Home / Self Care | Payer: Medicare Other | Source: Ambulatory Visit | Attending: Cardiovascular Disease | Admitting: Cardiovascular Disease

## 2019-11-26 ENCOUNTER — Encounter (HOSPITAL_COMMUNITY): Payer: Self-pay

## 2019-11-26 ENCOUNTER — Other Ambulatory Visit: Payer: Self-pay

## 2019-11-26 ENCOUNTER — Encounter (HOSPITAL_COMMUNITY)
Admission: RE | Admit: 2019-11-26 | Discharge: 2019-11-26 | Disposition: A | Discharge status: Home / Self Care | Payer: Medicare Other | Source: Ambulatory Visit | Attending: Cardiovascular Disease | Admitting: Cardiovascular Disease

## 2019-11-26 DIAGNOSIS — G4730 Sleep apnea, unspecified: Secondary | ICD-10-CM | POA: Diagnosis not present

## 2019-11-26 DIAGNOSIS — I350 Nonrheumatic aortic (valve) stenosis: Secondary | ICD-10-CM | POA: Diagnosis not present

## 2019-11-26 DIAGNOSIS — F0390 Unspecified dementia without behavioral disturbance: Secondary | ICD-10-CM | POA: Diagnosis not present

## 2019-11-26 DIAGNOSIS — E785 Hyperlipidemia, unspecified: Secondary | ICD-10-CM | POA: Diagnosis not present

## 2019-11-26 DIAGNOSIS — Z7901 Long term (current) use of anticoagulants: Secondary | ICD-10-CM | POA: Insufficient documentation

## 2019-11-26 DIAGNOSIS — Z7902 Long term (current) use of antithrombotics/antiplatelets: Secondary | ICD-10-CM | POA: Insufficient documentation

## 2019-11-26 DIAGNOSIS — E039 Hypothyroidism, unspecified: Secondary | ICD-10-CM | POA: Diagnosis not present

## 2019-11-26 DIAGNOSIS — I2510 Atherosclerotic heart disease of native coronary artery without angina pectoris: Secondary | ICD-10-CM | POA: Insufficient documentation

## 2019-11-26 DIAGNOSIS — Z79899 Other long term (current) drug therapy: Secondary | ICD-10-CM | POA: Insufficient documentation

## 2019-11-26 DIAGNOSIS — Z20828 Contact with and (suspected) exposure to other viral communicable diseases: Secondary | ICD-10-CM | POA: Insufficient documentation

## 2019-11-26 DIAGNOSIS — Z01818 Encounter for other preprocedural examination: Secondary | ICD-10-CM | POA: Diagnosis not present

## 2019-11-26 DIAGNOSIS — Z87891 Personal history of nicotine dependence: Secondary | ICD-10-CM | POA: Diagnosis not present

## 2019-11-26 DIAGNOSIS — I4891 Unspecified atrial fibrillation: Secondary | ICD-10-CM | POA: Diagnosis not present

## 2019-11-26 DIAGNOSIS — I252 Old myocardial infarction: Secondary | ICD-10-CM | POA: Diagnosis not present

## 2019-11-26 DIAGNOSIS — I10 Essential (primary) hypertension: Secondary | ICD-10-CM | POA: Insufficient documentation

## 2019-11-26 HISTORY — DX: Malignant (primary) neoplasm, unspecified: C80.1

## 2019-11-26 HISTORY — DX: Unspecified dementia, unspecified severity, without behavioral disturbance, psychotic disturbance, mood disturbance, and anxiety: F03.90

## 2019-11-26 LAB — ABO/RH: ABO/RH(D): B POS

## 2019-11-26 LAB — COMPREHENSIVE METABOLIC PANEL
ALT: 14 U/L (ref 0–44)
AST: 18 U/L (ref 15–41)
Albumin: 3.1 g/dL — ABNORMAL LOW (ref 3.5–5.0)
Alkaline Phosphatase: 61 U/L (ref 38–126)
Anion gap: 12 (ref 5–15)
BUN: 22 mg/dL (ref 8–23)
CO2: 22 mmol/L (ref 22–32)
Calcium: 9.3 mg/dL (ref 8.9–10.3)
Chloride: 105 mmol/L (ref 98–111)
Creatinine, Ser: 0.99 mg/dL (ref 0.44–1.00)
GFR calc Af Amer: >60 mL/min (ref >60)
GFR calc non Af Amer: 52 mL/min — ABNORMAL LOW (ref >60)
Glucose, Bld: 102 mg/dL — ABNORMAL HIGH (ref 70–99)
Potassium: 3.9 mmol/L (ref 3.5–5.1)
Sodium: 139 mmol/L (ref 135–145)
Total Bilirubin: 1 mg/dL (ref 0.3–1.2)
Total Protein: 6.4 g/dL — ABNORMAL LOW (ref 6.5–8.1)

## 2019-11-26 LAB — CBC
HCT: 41.8 % (ref 36.0–46.0)
Hemoglobin: 13.4 g/dL (ref 12.0–15.0)
MCH: 28.8 pg (ref 26.0–34.0)
MCHC: 32.1 g/dL (ref 30.0–36.0)
MCV: 89.9 fL (ref 80.0–100.0)
Platelets: 227 10*3/uL (ref 150–400)
RBC: 4.65 MIL/uL (ref 3.87–5.11)
RDW: 13.6 % (ref 11.5–15.5)
WBC: 7.5 10*3/uL (ref 4.0–10.5)
nRBC: 0 % (ref 0.0–0.2)

## 2019-11-26 LAB — BLOOD GAS, ARTERIAL
Acid-Base Excess: 2.4 mmol/L — ABNORMAL HIGH (ref 0.0–2.0)
Bicarbonate: 26.1 mmol/L (ref 20.0–28.0)
FIO2: 21
O2 Saturation: 97.8 %
Patient temperature: 37
pCO2 arterial: 38 mmHg (ref 32.0–48.0)
pH, Arterial: 7.451 — ABNORMAL HIGH (ref 7.350–7.450)
pO2, Arterial: 94.8 mmHg (ref 83.0–108.0)

## 2019-11-26 LAB — URINALYSIS, ROUTINE W REFLEX MICROSCOPIC
Bilirubin Urine: NEGATIVE
Glucose, UA: NEGATIVE mg/dL
Hgb urine dipstick: NEGATIVE
Ketones, ur: NEGATIVE mg/dL
Leukocytes,Ua: NEGATIVE
Nitrite: NEGATIVE
Protein, ur: NEGATIVE mg/dL
Specific Gravity, Urine: 1.025 (ref 1.005–1.030)
pH: 6 (ref 5.0–8.0)

## 2019-11-26 LAB — TYPE AND SCREEN
ABO/RH(D): B POS
Antibody Screen: NEGATIVE

## 2019-11-26 LAB — PROTIME-INR
INR: 2.1 — ABNORMAL HIGH (ref 0.8–1.2)
Prothrombin Time: 23.8 seconds — ABNORMAL HIGH (ref 11.4–15.2)

## 2019-11-26 LAB — BRAIN NATRIURETIC PEPTIDE: B Natriuretic Peptide: 286.4 pg/mL — ABNORMAL HIGH (ref 0.0–100.0)

## 2019-11-26 LAB — SURGICAL PCR SCREEN
MRSA, PCR: POSITIVE — AB
Staphylococcus aureus: POSITIVE — AB

## 2019-11-26 LAB — APTT: aPTT: 43 seconds — ABNORMAL HIGH (ref 24–36)

## 2019-11-26 LAB — HEMOGLOBIN A1C
Hgb A1c MFr Bld: 5.2 % (ref 4.8–5.6)
Mean Plasma Glucose: 102.54 mg/dL

## 2019-11-26 LAB — SARS CORONAVIRUS 2 (TAT 6-24 HRS): SARS Coronavirus 2: NEGATIVE

## 2019-11-26 NOTE — Progress Notes (Signed)
[id number] [place], [place] [id number] [place] [id number] Phone: [phone] Fax: [phone]      Your procedure is scheduled on Tuesday, December 1st, 2020.   Report to [facility] Main Entrance "A" at 9:00 A.M., and check in at the Admitting office.   Call this number if you have problems the morning of surgery:  [phone]  Call [phone] if you have any questions prior to your surgery date Monday-Friday 8am-4pm    Remember:  Do not eat or drink after midnight the night before your surgery   Take these medicines the morning of surgery with A SIP OF WATER :  Amlodipine (Norvasc) Levothyroxine (Synthroid) Metoprolol Tartrate (Lopressor) Pantoprazole (Protonix) Tramadol (Ultram) - if needed  Aspirin, Plavix and Coumadin per the directions of your surgeon.  7 days prior to surgery STOP taking any Aspirin (unless otherwise instructed by your surgeon), Aleve, Naproxen, Ibuprofen, Motrin, Advil, Goody's, BC's, all herbal medications, fish oil, and all vitamins.    The Morning of Surgery  Do not wear jewelry, make-up or nail polish.  Do not wear lotions, powders, or perfumes/colognes, or deodorant  Do not shave 48 hours prior to surgery.  Men may shave face and neck.  Do not bring valuables to the hospital.  [facility] is not responsible for any belongings or valuables.  If you are a smoker, DO NOT Smoke 24 hours prior to surgery  If you wear a CPAP at night please bring your mask, tubing, and machine the morning of surgery   Remember that you must have someone to transport you home after your surgery, and remain with you for 24 hours if you are discharged the same day.   Please bring cases for contacts, glasses, hearing aids, dentures or bridgework because it cannot be worn into surgery.    Leave your suitcase in the car.  After surgery it may be brought to your room.  For  patients admitted to the hospital, discharge time will be determined by your treatment team.  Patients discharged the day of surgery will not be allowed to drive home.    Special instructions:   [place]- Preparing For Surgery  Before surgery, you can play an important role. Because skin is not sterile, your skin needs to be as free of germs as possible. You can reduce the number of germs on your skin by washing with CHG (chlorahexidine gluconate) Soap before surgery.  CHG is an antiseptic cleaner which kills germs and bonds with the skin to continue killing germs even after washing.    Oral Hygiene is also important to reduce your risk of infection.  Remember - BRUSH YOUR TEETH THE MORNING OF SURGERY WITH YOUR REGULAR TOOTHPASTE  Please do not use if you have an allergy to CHG or antibacterial soaps. If your skin becomes reddened/irritated stop using the CHG.  Do not shave (including legs and underarms) for at least 48 hours prior to first CHG shower. It is OK to shave your face.  Please follow these instructions carefully.   1. Shower the NIGHT BEFORE SURGERY and the MORNING OF SURGERY with CHG Soap.   2. If you chose to wash your hair, wash your hair first as usual with your normal shampoo.  3. After you shampoo, rinse your hair and body thoroughly to remove the shampoo.  4. Use CHG as you would any other liquid soap. You can apply CHG directly  to the skin and wash gently with a scrungie or a clean washcloth.   5. Apply the CHG Soap to your body ONLY FROM THE NECK DOWN.  Do not use on open wounds or open sores. Avoid contact with your eyes, ears, mouth and genitals (private parts). Wash Face and genitals (private parts)  with your normal soap.   6. Wash thoroughly, paying special attention to the area where your surgery will be performed.  7. Thoroughly rinse your body with warm water from the neck down.  8. DO NOT shower/wash with your normal soap after using and rinsing off the  CHG Soap.  9. Pat yourself dry with a CLEAN TOWEL.  10. Wear CLEAN PAJAMAS to bed the night before surgery, wear comfortable clothes the morning of surgery  11. Place CLEAN SHEETS on your bed the night of your first shower and DO NOT SLEEP WITH PETS.    Day of Surgery:  Please shower the morning of surgery with the CHG soap Do not apply any deodorants/lotions. Please wear clean clothes to the hospital/surgery center.   Remember to brush your teeth WITH YOUR REGULAR TOOTHPASTE.   Please read over the following fact sheets that you were given.

## 2019-11-26 NOTE — Progress Notes (Signed)
PCP - Jenna Luo, MD Cardiologist - Lauree Chandler, MD  Chest x-ray - 11/26/19 EKG - 11/26/19 Stress Test - denies ECHO - 10/04/19 Cardiac Cath - 11/10/19  Blood Thinner Instructions: Per surgeon Aspirin Instructions: N/A  COVID TEST- scheduled after PAT 11/26/19; aware of need for self-quarantine    Coronavirus Screening  Have you experienced the following symptoms:  Cough yes/no: No Fever (>100.42F)  yes/no: No Runny nose yes/no: No Sore throat yes/no: No Difficulty breathing/shortness of breath  yes/no: No  Have you or a family member traveled in the last 14 days and where? yes/no: No  If the patient indicates "YES" to the above questions, their PAT will be rescheduled to limit the exposure to others and, the surgeon will be notified. THE PATIENT WILL NEED TO BE ASYMPTOMATIC FOR 14 DAYS.   If the patient is not experiencing any of these symptoms, the PAT nurse will instruct them to NOT bring anyone with them to their appointment since they may have these symptoms or traveled as well.   Please remind your patients and families that hospital visitation restrictions are in effect and the importance of the restrictions.    Anesthesia review: Yes; Hx severe aortic stenosis; HTN; dementia   Patient denies shortness of breath, fever, cough and chest pain at PAT appointment   All instructions explained to the patient, with a verbal understanding of the material. Patient agrees to go over the instructions while at home for a better understanding. Patient also instructed to self quarantine after being tested for COVID-19. The opportunity to ask questions was provided.

## 2019-11-29 ENCOUNTER — Other Ambulatory Visit: Payer: Self-pay

## 2019-11-29 ENCOUNTER — Ambulatory Visit (INDEPENDENT_AMBULATORY_CARE_PROVIDER_SITE_OTHER): Payer: Medicare Other

## 2019-11-29 ENCOUNTER — Encounter (HOSPITAL_COMMUNITY): Payer: Self-pay

## 2019-11-29 DIAGNOSIS — M810 Age-related osteoporosis without current pathological fracture: Secondary | ICD-10-CM

## 2019-11-29 DIAGNOSIS — I350 Nonrheumatic aortic (valve) stenosis: Secondary | ICD-10-CM

## 2019-11-29 MED ORDER — DEXMEDETOMIDINE HCL IN NACL 400 MCG/100ML IV SOLN
0.1000 ug/kg/h | INTRAVENOUS | Status: DC
  Filled 2019-11-29 (×2): qty 100

## 2019-11-29 MED ORDER — SODIUM CHLORIDE 0.9 % IV SOLN
1.5000 g | INTRAVENOUS | Status: AC
  Administered 2019-11-30: 1.5 g via INTRAVENOUS
  Filled 2019-11-29 (×2): qty 1.5

## 2019-11-29 MED ORDER — VANCOMYCIN HCL 10 G IV SOLR
1250.0000 mg | INTRAVENOUS | Status: AC
  Administered 2019-11-30: 1250 mg via INTRAVENOUS
  Filled 2019-11-29 (×2): qty 1250

## 2019-11-29 MED ORDER — POTASSIUM CHLORIDE 2 MEQ/ML IV SOLN
80.0000 meq | INTRAVENOUS | Status: DC
  Filled 2019-11-29 (×2): qty 40

## 2019-11-29 MED ORDER — SODIUM CHLORIDE 0.9 % IV SOLN
INTRAVENOUS | Status: DC
  Filled 2019-11-29 (×2): qty 30

## 2019-11-29 MED ORDER — MAGNESIUM SULFATE 50 % IJ SOLN
40.0000 meq | INTRAMUSCULAR | Status: DC
  Filled 2019-11-29 (×2): qty 9.85

## 2019-11-29 MED ORDER — NOREPINEPHRINE 4 MG/250ML-% IV SOLN
0.0000 ug/min | INTRAVENOUS | Status: AC
  Administered 2019-11-30: 2 ug/min via INTRAVENOUS
  Filled 2019-11-29: qty 250

## 2019-11-29 NOTE — H&P (Signed)
[address]       [place] [address]             [phone]      Cardiothoracic Surgery Admission History and Physical   Referring Provider is Hilty, Nadean Corwin, MD  Primary Cardiologist is Pixie Casino, MD  PCP is [name], MD      Chief Complaint  Patient presents with   Aortic Stenosis       HPI:  The patient is an 85 year old woman with a history of hypertension, hyperlipidemia, atrial fibrillation on Coumadin, coronary artery disease status post non-ST segment elevation MI and stenting of an obtuse marginal branch with a bare-metal stent in 2016, dementia, osteoporosis, and aortic stenosis who was referred by Dr. Angelena Form for consideration of TAVR. She is here today with her daughter who lives with her. She is not very active and uses a walker to mobilize around her house and out to her front porch where she likes to sit. She has noted worsening dyspnea and fatigue. Her daughter reports that this is been present for years but seems to be getting worse. She has had no chest pain or pressure. She denies any orthopnea. She denies lower extremity edema. She reports some dizziness but no syncope. Her most recent echo on 10/04/2019 showed severe aortic stenosis with a mean gradient of 47 mmHg and a peak gradient of 64 mmHg. Aortic valve area of 0.69 cm. Dimensionless index of 0.18. Left ventricular ejection fraction was 60 to 65%.      Past Medical History:  Diagnosis Date   A-fib [facility])    a. Dx 01/2015, CHA2DS2VASc = 5-->coumadin started.   CAD (coronary artery disease)    a. 01/2015 NSTEMI/PCI: LM nl, LAD 30p, LCX nondom, 30-40p, 50d, OM1 100p (2.75x16 Rebel BMS), RCA mild diff plaque, EF 55%.   Candida infection, disseminated ([place])    H/O echocardiogram    a. 01/2015 Echo: EF 50-55%, mild LVH, no rwma, mild MR, mildly dil LA, mod TR, PASP 53 mmHg.   Hyperlipidemia    Hypertension    Hypothyroid    Osteoporosis    Sleep apnea 04/2012     Mild/ AHI 10/CPAP 7cm h2o w/2 L o2        Past Surgical History:  Procedure Laterality Date   CARDIAC CATHETERIZATION  11/10/2019   RIGHT/LEFT HEART CATH AND CORONARY ANGIOGRAPHY    CARDIOVERSION N/A 08/22/2015   Procedure: CARDIOVERSION; Surgeon: Skeet Latch, MD; Location: [facility]; Service: Cardiovascular; Laterality: N/A;   KNEE SURGERY     LEFT HEART CATHETERIZATION WITH CORONARY ANGIOGRAM N/A 02/20/2015   Procedure: LEFT HEART CATHETERIZATION WITH CORONARY ANGIOGRAM; Surgeon: Jolaine Artist, MD; Location: [facility] CATH LAB; Service: Cardiovascular; Laterality: N/A;   PERCUTANEOUS CORONARY STENT INTERVENTION (PCI-S)  02/20/2015   Procedure: PERCUTANEOUS CORONARY STENT INTERVENTION (PCI-S); Surgeon: Jolaine Artist, MD; Location: [facility] CATH LAB; Service: Cardiovascular;; OM1 (2.75/42mm Rebel)   RIGHT/LEFT HEART CATH AND CORONARY ANGIOGRAPHY N/A 11/10/2019   Procedure: RIGHT/LEFT HEART CATH AND CORONARY ANGIOGRAPHY; Surgeon: Burnell Blanks, MD; Location: [facility] CV LAB; Service: Cardiovascular; Laterality: N/A;        Family History  Problem Relation Age of Onset   Coronary artery disease Father    Diabetes Father    Stroke Mother    Arthritis Mother    Social History        Socioeconomic History   Marital status: Married    Spouse name:  Not on file   Number of children: Not on file   Years of education: Not on file   Highest education level: Not on file  Occupational History   Not on file  Social Needs   Financial resource strain: Not on file   Food insecurity    Worry: Not on file    Inability: Not on file   Transportation needs    Medical: Not on file    Non-medical: Not on file  Tobacco Use   Smoking status: Former Smoker    Quit date: 04/28/1995    Years since quitting: 24.5   Smokeless tobacco: Never Used  Substance and Sexual Activity   Alcohol use: No    Alcohol/week: 0.0 standard drinks   Drug use: No   Sexual  activity: Not on file  Lifestyle   Physical activity    Days per week: Not on file    Minutes per session: Not on file   Stress: Not on file  Relationships   Social connections    Talks on phone: Not on file    Gets together: Not on file    Attends religious service: Not on file    Active member of club or organization: Not on file    Attends meetings of clubs or organizations: Not on file    Relationship status: Not on file   Intimate partner violence    Fear of current or ex partner: Not on file    Emotionally abused: Not on file    Physically abused: Not on file    Forced sexual activity: Not on file  Other Topics Concern   Not on file  Social History Narrative   Not on file         Current Outpatient Medications  Medication Sig Dispense Refill   ALPRAZolam (XANAX) 0.25 MG tablet TAKE 1 TABLET BY MOUTH EVERY DAY AT BEDTIME AS NEEDED FOR SLEEP (Patient taking differently: Take 0.25 mg by mouth at bedtime. ) 30 tablet 1   amLODipine (NORVASC) 5 MG tablet TAKE 1 TABLET (5 MG TOTAL) DAILY BY MOUTH. 90 tablet 1   atorvastatin (LIPITOR) 80 MG tablet TAKE 1 TABLET BY MOUTH DAILY X 6PM (Patient taking differently: Take 80 mg by mouth daily at 6 PM. ) 90 tablet 2   calcium carbonate 1250 MG capsule Take 1,250 mg by mouth 2 (two) times daily with a meal.     cholecalciferol (VITAMIN D) 1000 UNITS tablet Take 1,000 Units by mouth daily.     clopidogrel (PLAVIX) 75 MG tablet TAKE 1 TABLET BY MOUTH EVERY DAY 90 tablet 2   cyanocobalamin 1000 MCG tablet Take 100 mcg by mouth daily.     levothyroxine (SYNTHROID) 137 MCG tablet TAKE 1 TABLET BY MOUTH EVERY DAY 90 tablet 3   lisinopril (ZESTRIL) 5 MG tablet Take 1 tablet (5 mg total) by mouth daily. 30 tablet 9   metoprolol tartrate (LOPRESSOR) 50 MG tablet TAKE 1 TABLET BY MOUTH TWICE A DAY 180 tablet 1   pantoprazole (PROTONIX) 40 MG tablet TAKE 1 TABLET BY MOUTH EVERY DAY 90 tablet 2   traMADol (ULTRAM) 50 MG tablet Take  1 tablet (50 mg total) by mouth every 8 (eight) hours as needed. 30 tablet 0   warfarin (COUMADIN) 1 MG tablet TAKE 2-3 TABLETS BY MOUTH DAILY AS DIRECTED BY COUMADIN CLINIC 240 tablet 1            Current Facility-Administered Medications  Medication Dose Route Frequency Provider  Last Rate Last Dose   denosumab (PROLIA) injection 60 mg 60 mg Subcutaneous Q6 months [name], MD  60 mg at 05/26/19 1055        Allergies  Allergen Reactions   Fosamax [Alendronate Sodium] Other (See Comments)    Unknown per daughter   Review of Systems:   General: normal appetite, + decreased energy, no weight gain, no weight loss, no fever  Cardiac: no chest pain with exertion, no chest pain at rest, +SOB with exertion, + occasional resting SOB, no PND, no orthopnea, no palpitations, no arrhythmia, + atrial fibrillation, no LE edema, + dizzy spells, no syncope  Respiratory: + shortness of breath, no home oxygen, no productive cough, no dry cough, no bronchitis, no wheezing, no hemoptysis, no asthma, no pain with inspiration or cough, + sleep apnea, no CPAP at night  GI: no difficulty swallowing, no reflux, no frequent heartburn, no hiatal hernia, no abdominal pain, + constipation, no diarrhea, no hematochezia, no hematemesis, no melena  GU: no dysuria, + frequency, no urinary tract infection, no hematuria, no kidney stones, no kidney disease  Vascular: no pain suggestive of claudication, no pain in feet, no leg cramps, no varicose veins, no DVT, no non-healing foot ulcer  Neuro: no stroke, no TIA's, no seizures, no headaches, no temporary blindness one eye, no slurred speech, no peripheral neuropathy, no chronic pain, + instability of gait, + memory/cognitive dysfunction  Musculoskeletal: + arthritis, no joint swelling, no myalgias, + difficulty walking, + reduced mobility and uses a walker  Skin: no rash, no itching, no skin infections, no pressure sores or ulcerations  Psych: + anxiety, no  depression, no nervousness, no unusual recent stress  Eyes: no blurry vision, no floaters, no recent vision changes, + wears glasses for reading.  ENT: no hearing loss, no loose or painful teeth, + dentures Hematologic: + easy bruising, no abnormal bleeding, no clotting disorder, no frequent epistaxis  Endocrine: no diabetes, does not check CBG's at home    Physical Exam:   BP 127/77 (BP Location: Right Arm, Patient Position: Sitting, Cuff Size: Normal)   Pulse 74   Temp 97.6 F (36.4 C)   Resp 16   Ht 4\' 9"  (1.448 m)   Wt 175 lb (79.4 kg)   SpO2 92% Comment: RA   BMI 37.87 kg/m  General: Obese, elderly  HEENT: Unremarkable, NCAT, PERLA, EOMI  Neck: no JVD, no bruits, no adenopathy  Chest: clear to auscultation, symmetrical breath sounds, no wheezes, no rhonchi  CV: RRR, grade III/VI crescendo/decrescendo murmur heard best at RSB, no diastolic murmur  Abdomen: soft, non-tender, no masses  Extremities: warm, well-perfused, pulses palpable in feet, no LE edema  Rectal/GU Deferred  Neuro: Grossly non-focal and symmetrical throughout  Skin: Clean and dry, no rashes, no breakdown    Diagnostic Tests:   ECHOCARDIOGRAM REPORT  Patient Name: [name] Date of Exam: 10/04/2019  Medical Rec #: [id number] Height: 62.0 in  Accession #: [id number] Weight: 170.0 lb  Date of Birth: 1933-09-11 BSA: 1.78 m  Patient Age: 67 years BP: 116/76 mmHg  Patient Gender: F HR: 77 bpm.  Exam Location: [address]  Procedure: 2D Echo, Cardiac Doppler and Color Doppler  Indications: I35.0 Aortic Stenosis  History: Patient has prior history of Echocardiogram examinations, most  recent 10/15/2018. Sleep apnea-CPAP. Coronary artery disease.  Hypothyroidism. NSTEMI. Atrial Fibrillation.  Sonographer: Wilford Sports Rodgers-Jones RDCS  Referring Phys: [id number] KENNETH C HILTY  IMPRESSIONS  1. Left ventricular ejection fraction, by  visual estimation, is 60 to 65%. The left ventricle has normal function. Normal left  ventricular size. There is mildly increased left ventricular hypertrophy.  2. Global right ventricle has normal systolic function.The right ventricular size is normal. No increase in right ventricular wall thickness.  3. Left atrial size was mild-moderately dilated.  4. Right atrial size was normal.  5. The mitral valve is normal in structure. No evidence of mitral valve regurgitation. No evidence of mitral stenosis.  6. The tricuspid valve is normal in structure. Tricuspid valve regurgitation is mild.  7. Aortic valve mean gradient measures 40.5 mmHg.  8. Aortic valve peak gradient measures 60.2 mmHg.  9. The aortic valve is tricuspid Aortic valve regurgitation was not visualized by color flow Doppler. Severe aortic valve stenosis.  10. Peak aortic velocity 2m/s. Mean 66mmHg. Severe AS.  11. The pulmonic valve was normal in structure. Pulmonic valve regurgitation is mild by color flow Doppler.  12. Moderately elevated pulmonary artery systolic pressure.  13. The inferior vena cava is normal in size with greater than 50% respiratory variability, suggesting right atrial pressure of 3 mmHg.  FINDINGS  Left Ventricle: Left ventricular ejection fraction, by visual estimation, is 60 to 65%. The left ventricle has normal function. There is mildly increased left ventricular hypertrophy. Normal left ventricular size.  Right Ventricle: The right ventricular size is normal. No increase in right ventricular wall thickness. Global RV systolic function is has normal systolic function. The tricuspid regurgitant velocity is 2.77 m/s, and with an assumed right atrial pressure  of 10 mmHg, the estimated right ventricular systolic pressure is moderately elevated at 40.7 mmHg.  Left Atrium: Left atrial size was mild-moderately dilated.  Right Atrium: Right atrial size was normal in size  Pericardium: There is no evidence of pericardial effusion.  Mitral Valve: The mitral valve is normal in structure. No evidence of  mitral valve stenosis by observation. No evidence of mitral valve regurgitation.  Tricuspid Valve: The tricuspid valve is normal in structure. Tricuspid valve regurgitation is mild by color flow Doppler.  Aortic Valve: The aortic valve is tricuspid. Aortic valve regurgitation was not visualized by color flow Doppler. Severe aortic stenosis is present. Aortic valve mean gradient measures 40.5 mmHg. Aortic valve peak gradient measures 60.2 mmHg. Aortic  valve area, by VTI measures 0.69 cm. Peak aortic velocity 67m/s. Mean 95mmHg. Severe AS.  Pulmonic Valve: The pulmonic valve was normal in structure. Pulmonic valve regurgitation is mild by color flow Doppler.  Aorta: The aortic root, ascending aorta and aortic arch are all structurally normal, with no evidence of dilitation or obstruction.  Venous: The inferior vena cava is normal in size with greater than 50% respiratory variability, suggesting right atrial pressure of 3 mmHg.  IAS/Shunts: No atrial level shunt detected by color flow Doppler. No ventricular septal defect is seen or detected. There is no evidence of an atrial septal defect.  LEFT VENTRICLE  PLAX 2D  LVIDd: 4.20 cm Diastology  LVIDs: 3.00 cm LV e' lateral: 11.30 cm/s  LV PW: 1.20 cm LV E/e' lateral: 10.1  LV IVS: 1.10 cm LV e' medial: 10.30 cm/s  LVOT diam: 2.20 cm LV E/e' medial: 11.1  LV SV: 44 ml  LV SV Index: 23.36  LVOT Area: 3.80 cm  RIGHT VENTRICLE  RV Basal diam: 3.10 cm  RV S prime: 10.10 cm/s  TAPSE (M-mode): 1.9 cm  LEFT ATRIUM Index RIGHT ATRIUM Index  LA diam: 5.30 cm 2.97 cm/m RA Area: 19.50 cm  LA  Vol [facility]): 91.6 ml 51.34 ml/m RA Volume: 57.40 ml 32.17 ml/m  LA Vol (A4C): 73.3 ml 41.09 ml/m  LA Biplane Vol: 83.2 ml 46.63 ml/m  AORTIC VALVE  AV Area (Vmax): 0.61 cm  AV Area (Vmean): 0.57 cm  AV Area (VTI): 0.69 cm  AV Vmax: 388.00 cm/s  AV Vmean: 304.500 cm/s  AV VTI: 0.922 m  AV Peak Grad: 60.2 mmHg  AV Mean Grad: 40.5 mmHg  LVOT Vmax: 61.83  cm/s  LVOT Vmean: 45.800 cm/s  LVOT VTI: 0.167 m  LVOT/AV VTI ratio: 0.18  AORTA  Ao Root diam: 2.60 cm  Ao Asc diam: 3.30 cm  MITRAL VALVE TRICUSPID VALVE  MV Area (PHT): 3.99 cm TR Peak grad: 30.7 mmHg  MV PHT: 55.10 msec TR Vmax: 296.00 cm/s  MV Decel Time: 190 msec  MV E velocity: 114.00 cm/s 103 cm/s SHUNTS  MV A velocity: 34.50 cm/s 70.3 cm/s Systemic VTI: 0.17 m  MV E/A ratio: 3.30 1.5 Systemic Diam: 2.20 cm  Candee Furbish MD  Electronically signed by Candee Furbish MD  Signature Date/Time: 10/04/2019/12:50:29 PM     Panel Physicians Referring Physician Case Authorizing Physician  Burnell Blanks, MD (Primary)    Procedures  RIGHT/LEFT HEART CATH AND CORONARY ANGIOGRAPHY  Conclusion  Prox RCA lesion is 20% stenosed.  Dist RCA lesion is 20% stenosed.  Mid RCA lesion is 20% stenosed.  Dist Cx lesion is 40% stenosed.  Previously placed 1st Mrg stent (unknown type) is widely patent.  Mid LAD lesion is 20% stenosed. 1. Patent stent obtuse marginal branch without restenosis  2. Mild non-obstructive disease in the LAD, Circumflex and RCA  3. Severe aortic valve stenosis (mean gradient 34.8 mmHg, peak to peak gradient 35 mmHg, AVA 0.72 cm2)  Will continue workup for TAVR.   Recommendations  Antiplatelet/Anticoag Continue workup for TAVR  Indications  Severe aortic stenosis [I35.0 (ICD-10-CM)]  Procedural Details  Technical Details Indication: Severe aortic stenosis, prior bare metal stent OM  Procedure: The risks, benefits, complications, treatment options, and expected outcomes were discussed with the patient. The patient and/or family concurred with the proposed plan, giving informed consent. The patient was brought to the cath lab after IV hydration was given. The patient was not sedated. The IV catheter present in the right antecubital vein was changed for a 5 [place] sheath. Right heart catheterization performed with a balloon tipped catheter. The right wrist was  prepped and draped in a sterile fashion. 1% lidocaine was used for local anesthesia. Using the modified Seldinger access technique, a 5 French sheath was placed in the right radial artery using u/s guidance. 3 mg Verapamil was given through the sheath. 4000 units IV heparin was given. Standard diagnostic catheters were used to perform selective coronary angiography. I crossed the aortic valve with an AL-1 catheter and a straight wire. The sheath was removed from the right radial artery and a Terumo hemostasis band was applied at the arteriotomy site on the right wrist.    Estimated blood loss <50 mL.   During this procedure no sedation was administered.  Medications  (Filter: Administrations occurring from 11/10/19 0826 to 11/10/19 0938)          (important) Continuous medications are totaled by the amount administered until 11/10/19 0938.  Medication Rate/Dose/Volume Action  Date Time   Heparin (Porcine) in NaCl 1000-0.9 UT/500ML-% SOLN (mL) 500 mL Given 11/10/19 0842   Total dose as of 11/10/19 0938 500 mL Given 0842   1,000 mL  lidocaine (PF) (XYLOCAINE) 1 % injection (mL) 1 mL Given 11/10/19 0853   Total dose as of 11/10/19 0938 2 mL Given 0859   3 mL        Radial Cocktail/Verapamil only (mL) 10 mL Given 11/10/19 0901   Total dose as of 11/10/19 0938        10 mL        heparin injection (Units) 4,000 Units Given 11/10/19 0904   Total dose as of 11/10/19 0938        4,000 Units        iohexol (OMNIPAQUE) 350 MG/ML injection (mL) 100 mL Given 11/10/19 0929   Total dose as of 11/10/19 0938        100 mL        Contrast  Medication Name Total Dose  iohexol (OMNIPAQUE) 350 MG/ML injection 100 mL  Radiation/Fluoro  Fluoro time: 8.5 (min)  DAP: 19637 (mGycm2)  Cumulative Air Kerma: [id number] (mGy)  Complications  Complications documented before study signed (11/10/2019 10:13 AM)   RIGHT/LEFT HEART CATH AND CORONARY ANGIOGRAPHY   None Documented by Burnell Blanks, MD  11/10/2019 9:29 AM  Date Found: 11/10/2019  Time Range: Intraprocedure     None Documented by Burnell Blanks, MD 11/10/2019 9:29 AM  Date Found: 11/10/2019  Time Range: Intraprocedure    Coronary Findings  Diagnostic  Dominance: Right  Left Anterior Descending  Vessel is large.  Mid LAD lesion 20% stenosed  Mid LAD lesion is 20% stenosed.  Left Circumflex  Vessel is large.  Dist Cx lesion 40% stenosed  Dist Cx lesion is 40% stenosed.  First Obtuse Marginal Branch  Vessel is moderate in size.  1st Mrg lesion 0% stenosed  Previously placed 1st Mrg stent (unknown type) is widely patent.  Right Coronary Artery  Vessel is large.  Prox RCA lesion 20% stenosed  Prox RCA lesion is 20% stenosed.  Mid RCA lesion 20% stenosed  Mid RCA lesion is 20% stenosed.  Dist RCA lesion 20% stenosed  Dist RCA lesion is 20% stenosed.  Intervention  No interventions have been documented.  Coronary Diagrams  Diagnostic  Dominance: Right   Intervention  Implants     No implant documentation for this case.  Syngo Images  Link to Procedure Log   Show images for CARDIAC CATHETERIZATION Procedure Log  Images on Long Term Storage    Show images for Kanetha, Piro   [facility] Data   Most Recent Value  Fick Cardiac Output 5.28 L/min  Fick Cardiac Output Index 2.96 (L/min)/BSA  Aortic Mean Gradient 34.85 mmHg  Aortic Peak Gradient 35 mmHg  Aortic Valve Area 0.72  Aortic Value Area Index 0.41 cm2/BSA  RA A Wave 8 mmHg  RA V Wave 9 mmHg  RA Mean 8 mmHg  RV Systolic Pressure 48 mmHg  RV Diastolic Pressure 3 mmHg  RV EDP 8 mmHg  PA Systolic Pressure 47 mmHg  PA Diastolic Pressure 18 mmHg  PA Mean 30 mmHg  PW A Wave 19 mmHg  PW V Wave 19 mmHg  PW Mean 19 mmHg  AO Systolic Pressure [id number] mmHg  AO Diastolic Pressure 72 mmHg  AO Mean 96 mmHg  LV Systolic Pressure [id number] mmHg  LV Diastolic Pressure 10 mmHg  LV EDP 16 mmHg  AOp Systolic Pressure [id number] mmHg  AOp Diastolic Pressure 66 mmHg    AOp Mean Pressure 94 mmHg  LVp Systolic Pressure 123XX123 mmHg  LVp Diastolic Pressure 10 mmHg  LVp EDP  Pressure 16 mmHg  QP/QS 1  TPVR Index 10.13 HRUI  TSVR Index 32.41 HRUI  PVR SVR Ratio 0.14  TPVR/TSVR Ratio 0.31     ADDENDUM REPORT: 11/16/2019 19:52  CLINICAL DATA: 85 year old female with severe aortic stenosis being  evaluated for a TAVR procedure.  EXAM:  Cardiac TAVR CT  TECHNIQUE:  The patient was scanned on a [employer]. A 120 kV  retrospective scan was triggered in the descending thoracic aorta at  111 HU's. Gantry rotation speed was 250 msecs and collimation was .6  mm. No beta blockade or nitro were given. The 3D data set was  reconstructed in 5% intervals of the R-R cycle. Systolic and  diastolic phases were analyzed on a dedicated work station using  MPR, MIP and VRT modes. The patient received 80 cc of contrast.  FINDINGS:  Aortic Valve: Trileaflet aortic valve with moderately thickened and  calcified leaflets and no calcifications extending into the LVOT.  Aorta: Normal size with mild diffuse atherosclerotic plaque and no  dissection.  Sinotubular Junction: 27 x 26 mm  Ascending Thoracic Aorta: 32 x 31 mm  Aortic Arch: 25 x 24 mm  Descending Thoracic Aorta: 22 x 22 mm  Sinus of Valsalva Measurements:  Non-coronary: 30 mm  Right -coronary: 30 mm  Left -coronary: 32 mm  Coronary Artery Height above Annulus:  Left Main: 11 mm  Right Coronary: 17 mm  Virtual Basal Annulus Measurements:  Maximum/Minimum Diameter: 26.4 x 20.7 mm  Mean Diameter: 23.4 mm  Perimeter: 75.5 mm  Area: 432 mm2  Optimum Fluoroscopic Angle for Delivery: LAO 35 CAU 23  IMPRESSION:  1. Trileaflet aortic valve with moderately thickened and calcified  leaflets and no calcifications extending into the LVOT. Aortic valve  calcium score 2044 consistent with severe aortic stenosis. Annular  measurements are borderline between 23 and 26 mm Edwards-SAPIEN 3  Ultra valve.  2.  Sufficient coronary to annulus distance.  3. Optimum Fluoroscopic Angle for Delivery: LAO 35 CAU 23  4. No thrombus in the left atrial appendage.  5. Dilated pulmonary artery measuring 34 mm.  Electronically Signed  By: Ena Dawley  On: 11/16/2019 19:52     CLINICAL DATA: Severe symptomatic aortic stenosis. Pre-TAVR  evaluation.  EXAM:  CT ANGIOGRAPHY CHEST  CTA ABDOMEN AND PELVIS WITH CONTRAST  TECHNIQUE:  Multidetector CT imaging through the chest was performed using the  standard protocol during bolus administration of intravenous  contrast. Multiplanar reconstructed images and MIPs were obtained  and reviewed to evaluate the vascular anatomy.  Multidetector CT imaging of the abdomen and pelvis was performed  using the standard protocol before and during bolus administration  of intravenous contrast. Multiplanar reconstructed images and MIPs  were obtained and reviewed to evaluate the vascular anatomy.  CONTRAST: 154mL OMNIPAQUE IOHEXOL 350 MG/ML SOLN  COMPARISON: 07/10/2019 CT abdomen/pelvis. 02/17/2015 chest CT  angiogram.  FINDINGS:  CTA CHEST FINDINGS  Cardiovascular: Moderate cardiomegaly. No significant pericardial  effusion/thickening. Diffuse thickening and calcification of the  aortic valve. Three-vessel coronary atherosclerosis. Atherosclerotic  nonaneurysmal thoracic aorta. Top-normal caliber main pulmonary  artery (3.3 cm diameter). No central pulmonary emboli.  Mediastinum/Nodes: Dominant calcified 1.2 cm left thyroid nodule.  Unremarkable esophagus. No axillary adenopathy. Mildly enlarged 1.3  cm subcarinal node (series 15/image 209), stable since 02/17/2015  chest CT. No new pathologically enlarged mediastinal nodes. No hilar  adenopathy.  Lungs/Pleura: No pneumothorax. No pleural effusion. Mosaic  attenuation throughout both lungs. Diffuse bronchial wall  thickening. No acute  consolidative airspace disease, lung masses or  significant pulmonary nodules.    Musculoskeletal: No aggressive appearing focal osseous lesions.  Moderate thoracic spondylosis. Intervertebral disc ankylosis in the  lower thoracic spine.  CTA ABDOMEN AND PELVIS FINDINGS  Hepatobiliary: Normal liver with no liver mass. Cholelithiasis. No  biliary ductal dilatation.  Pancreas: Normal, with no mass or duct dilation.  Spleen: Normal size. No mass.  Adrenals/Urinary Tract: Normal adrenals. No hydronephrosis.  Parapelvic renal cysts in both kidneys. Exophytic simple 1.0 cm  lower left renal cortical cyst. Additional scattered subcentimeter  hypodense renal cortical lesions in both kidneys are too small to  characterize and require no follow-up. Normal bladder.  Stomach/Bowel: Normal non-distended stomach. Normal caliber small  bowel with no small bowel wall thickening. Normal appendix. Mild  sigmoid diverticulosis, with no large bowel wall thickening or  significant pericolonic fat stranding.  Vascular/Lymphatic: Atherosclerotic nonaneurysmal abdominal aorta.  Patent portal, splenic and renal veins. No pathologically enlarged  lymph nodes in the abdomen or pelvis.  Reproductive: Normal uterus. No discrete adnexal mass. Mild  asymmetric soft tissue prominence in the right adnexa is unchanged  since 2005 CT, considered benign.  Other: No pneumoperitoneum, ascites or focal fluid collection.  Musculoskeletal: No aggressive appearing focal osseous lesions.  Marked lumbar spondylosis.  VASCULAR MEASUREMENTS PERTINENT TO TAVR:  AORTA:  Minimal Aortic Diameter-14.3 x 14.2 mm  Severity of Aortic Calcification-moderate  RIGHT PELVIS:  Right Common Iliac Artery -  Minimal Diameter-7.3 x 6.3 mm  Tortuosity-mild  Calcification-moderate to severe  Right External Iliac Artery -  Minimal Diameter-7.5 x 7.4 mm  Tortuosity-moderate to severe  Calcification-none  Right Common Femoral Artery -  Minimal Diameter-7.0 x 5.4 mm  Tortuosity-mild  Calcification-mild  LEFT PELVIS:   Left Common Iliac Artery -  Minimal Diameter-9.1 x 7.9 mm  Tortuosity-moderate  Calcification-moderate  Left External Iliac Artery -  Minimal Diameter-8.6 x 8.3 mm  Tortuosity-moderate  Calcification-none  Left Common Femoral Artery -  Minimal Diameter-8.3 x 7.9 mm  Tortuosity-mild  Calcification-mild  Review of the MIP images confirms the above findings.  IMPRESSION:  1. Vascular findings and measurements pertinent to potential TAVR  procedure, as detailed.  2. Diffuse thickening and calcification of the aortic valve,  compatible with a reported history of severe symptomatic aortic  stenosis.  3. Moderate cardiomegaly. Three-vessel coronary atherosclerosis.  4. Mosaic attenuation throughout both lungs, nonspecific, which  could be due to air trapping from small airways disease or mosaic  perfusion from pulmonary vascular disease.  5. Cholelithiasis.  6. Mild sigmoid diverticulosis.  7. Aortic Atherosclerosis (ICD10-I70.0).  Electronically Signed  By: Ilona Sorrel M.D.  On: 11/16/2019 13:59    STS Risk Score: AVR   Risk of Mortality:  5.267%  Renal Failure:  3.964%  Permanent Stroke:  3.824%  Prolonged Ventilation:  11.136%  DSW Infection:  0.088%  Reoperation:  4.169%  Morbidity or Mortality:  17.314%  Short Length of Stay:  18.078%  Long Length of Stay:  7.981%    Impression:   This 85 year old woman has stage D, severe, symptomatic aortic stenosis with New York Heart Association class III symptoms of exertional fatigue and shortness of breath consistent with chronic diastolic congestive heart failure. She is chronically deconditioned and not very active due to obesity and arthritis, requiring a walker for ambulation. I have personally reviewed her 2D echocardiogram, cardiac catheterization, and CTA studies. Her echocardiogram shows a trileaflet aortic valve with severe calcification and restricted mobility with a mean gradient of  40.5 mmHg and a peak  gradient of 60.2 mmHg. Left ventricular systolic function is normal. Cardiac catheterization showed a patent stent in the obtuse marginal branch and otherwise mild nonobstructive disease. The mean gradient across aortic valve was measured at 35 mmHg with a calculated valve area of 0.72 cm. It is difficult to tell how significant her symptoms are due to her decreased mobility and some dementia but her daughter said that she does get short of breath with walking in the house and sometimes at rest. I do not think she would be a candidate for open surgical aortic valve replacement for transcatheter aortic valve replacement seems to be a reasonable alternative. Her gated cardiac CTA shows anatomy suitable for transcatheter aortic valve replacement using a Sapien 3 valve. Her abdominal and pelvic CTA shows adequate pelvic vascular anatomy to allow transfemoral insertion.  The patient and her daughter were counseled at length regarding treatment alternatives for management of severe symptomatic aortic stenosis. The risks and benefits of surgical intervention has been discussed in detail. Long-term prognosis with medical therapy was discussed. Alternative approaches such as conventional surgical aortic valve replacement, transcatheter aortic valve replacement, and palliative medical therapy were compared and contrasted at length. This discussion was placed in the context of the patient's own specific clinical presentation and past medical history. All of their questions have been addressed.  Following the decision to proceed with transcatheter aortic valve replacement, a discussion was held regarding what types of management strategies would be attempted intraoperatively in the event of life-threatening complications, including whether or not the patient would be considered a candidate for the use of cardiopulmonary bypass and/or conversion to open sternotomy for attempted surgical intervention. I do not think she would  be a candidate for emergent sternotomy to manage any intraoperative complications. The patient has been advised of a variety of complications that might develop including but not limited to risks of death, stroke, paravalvular leak, aortic dissection or other major vascular complications, aortic annulus rupture, device embolization, cardiac rupture or perforation, mitral regurgitation, acute myocardial infarction, arrhythmia, heart block or bradycardia requiring permanent pacemaker placement, congestive heart failure, respiratory failure, renal failure, pneumonia, infection, other late complications related to structural valve deterioration or migration, or other complications that might ultimately cause a temporary or permanent loss of functional independence or other long term morbidity. The patient and her daughter provide full informed consent for the procedure as described and all questions were answered.   Plan:   Transfemoral transcatheter aortic valve placement.   Gaye Pollack, MD

## 2019-11-29 NOTE — Anesthesia Preprocedure Evaluation (Addendum)
Anesthesia Evaluation  Patient identified by MRN, date of birth, ID band Patient awake    Reviewed: Allergy & Precautions, NPO status , Patient's Chart, lab work & pertinent test results  Airway Mallampati: II  TM Distance: >3 FB     Dental  (+) Edentulous Upper, Edentulous Lower   Pulmonary former smoker,    breath sounds clear to auscultation       Cardiovascular hypertension,  Rhythm:Irregular Rate:Normal + Systolic murmurs    Neuro/Psych    GI/Hepatic   Endo/Other    Renal/GU      Musculoskeletal   Abdominal   Peds  Hematology   Anesthesia Other Findings   Reproductive/Obstetrics                            Anesthesia Physical Anesthesia Plan  ASA: III  Anesthesia Plan: MAC   Post-op Pain Management:    Induction: Intravenous  PONV Risk Score and Plan: Ondansetron and Dexamethasone  Airway Management Planned: Natural Airway and Simple Face Mask  Additional Equipment: Arterial line  Intra-op Plan:   Post-operative Plan:   Informed Consent: I have reviewed the patients History and Physical, chart, labs and discussed the procedure including the risks, benefits and alternatives for the proposed anesthesia with the patient or authorized representative who has indicated his/her understanding and acceptance.       Plan Discussed with: CRNA and Anesthesiologist  Anesthesia Plan Comments: (PAT note written 11/29/2019 by Myra Gianotti, PA-C. )       Anesthesia Quick Evaluation

## 2019-11-29 NOTE — Progress Notes (Signed)
Anesthesia Chart Review:  Case: [id number] Date/Time: 11/30/19 1045   Procedures:      TRANSCATHETER AORTIC VALVE REPLACEMENT, TRANSFEMORAL (N/A )     TRANSESOPHAGEAL ECHOCARDIOGRAM (TEE) (N/A )   Anesthesia type: General   Pre-op diagnosis: Severe Aortic Stenosis   Location: [facility] OR ROOM 16 / [place] [facility]   Surgeon: Burnell Blanks, MD      DISCUSSION: Patient is an 85 year old female scheduled for the above procedure.  History includes former smoker (quit 1996), severe aortic stenosis,CAD (NSTEMI, s/p BMS OM1 02/20/15), afib (s/p unsuccessful DCCV 08/22/15), HLD, HTN, hypothyroidism, dementia, cancer (not specified), OSA (mild, 2013)  Appears she is on Plavix (and not ASA) for CAD and warfarin for afib. Per records reviewed, she was to start holding Coumadin on 11/25/19. PT/PTT on 11/26/19 were 23.8 and 43 with INR 2.1.  Will repeat PT/PTT STAT on the day of surgery. 11/26/19 COVID-19 test negative.    VS: BP 104/84   Pulse 90   Temp 36.5 C   Resp 18   Ht 5\' 2"  (1.575 m)   Wt 78.6 kg   SpO2 100%   BMI 31.70 kg/m    PROVIDERS: [name], MD is PCP  Lyman Bishop, MD is cardiologist   LABS: Preoperative labs noted.  (all labs ordered are listed, but only abnormal results are displayed)  Labs Reviewed  SURGICAL PCR SCREEN - Abnormal; Notable for the following components:      Result Value   MRSA, PCR POSITIVE (*)    Staphylococcus aureus POSITIVE (*)    All other components within normal limits  APTT - Abnormal; Notable for the following components:   aPTT 43 (*)    All other components within normal limits  BLOOD GAS, ARTERIAL - Abnormal; Notable for the following components:   pH, Arterial 7.451 (*)    Acid-Base Excess 2.4 (*)    All other components within normal limits  BRAIN NATRIURETIC PEPTIDE - Abnormal; Notable for the following components:   B Natriuretic Peptide 286.4 (*)    All other components within normal limits  COMPREHENSIVE METABOLIC PANEL -  Abnormal; Notable for the following components:   Glucose, Bld 102 (*)    Total Protein 6.4 (*)    Albumin 3.1 (*)    GFR calc non Af Amer 52 (*)    All other components within normal limits  PROTIME-INR - Abnormal; Notable for the following components:   Prothrombin Time 23.8 (*)    INR 2.1 (*)    All other components within normal limits  CBC  HEMOGLOBIN A1C  URINALYSIS, ROUTINE W REFLEX MICROSCOPIC  TYPE AND SCREEN     IMAGES: CXR 11/26/19: IMPRESSION: Enlargement of cardiac silhouette. Mild chronic bronchitic changes without infiltrate.   EKG: 11/26/19: Atrial fibrillation with premature ventricular or aberrantly conducted complexes Low voltage QRS Inferior infarct , age undetermined Cannot rule out Anterior infarct , age undetermined Abnormal ECG Confirmed by Ida Rogue [id number]) on 11/26/2019 2:14:32 PM   CV: Carotid US 11/16/19: Summary: Right Carotid: Velocities in the right ICA are consistent with a 1-39% stenosis. Left Carotid: Velocities in the left ICA are consistent with a 1-39% stenosis. Vertebrals:  Bilateral vertebral arteries demonstrate antegrade flow. Subclavians: Normal flow hemodynamics were seen in bilateral subclavian              arteries.   CT Coronary 11/16/19: IMPRESSION: 1. Trileaflet aortic valve with moderately thickened and calcified leaflets and no calcifications extending into the LVOT. Aortic valve  calcium score 2044 consistent with severe aortic stenosis. Annular measurements are borderline between 23 and 26 mm Edwards-SAPIEN 3 Ultra valve. 2. Sufficient coronary to annulus distance. 3. Optimum Fluoroscopic Angle for Delivery: LAO 35 CAU 23 4. No thrombus in the left atrial appendage. 5. Dilated pulmonary artery measuring 34 mm.   Cardiac cath 11/10/19:  Prox RCA lesion is 20% stenosed.  Dist RCA lesion is 20% stenosed.  Mid RCA lesion is 20% stenosed.  Dist Cx lesion is 40% stenosed.  Previously placed 1st Mrg  stent (unknown type) is widely patent.  Mid LAD lesion is 20% stenosed. 1. Patent stent obtuse marginal branch without restenosis 2. Mild non-obstructive disease in the LAD, Circumflex and RCA 3. Severe aortic valve stenosis (mean gradient 34.8 mmHg, peak to peak gradient 35 mmHg, AVA 0.72 cm2) Will continue workup for TAVR.    Echo 10/04/19: IMPRESSIONS  1. Left ventricular ejection fraction, by visual estimation, is 60 to 65%. The left ventricle has normal function. Normal left ventricular size. There is mildly increased left ventricular hypertrophy.  2. Global right ventricle has normal systolic function.The right ventricular size is normal. No increase in right ventricular wall thickness.  3. Left atrial size was mild-moderately dilated.  4. Right atrial size was normal.  5. The mitral valve is normal in structure. No evidence of mitral valve regurgitation. No evidence of mitral stenosis.  6. The tricuspid valve is normal in structure. Tricuspid valve regurgitation is mild.  7. Aortic valve mean gradient measures 40.5 mmHg.  8. Aortic valve peak gradient measures 60.2 mmHg.  9. The aortic valve is tricuspid Aortic valve regurgitation was not visualized by color flow Doppler. Severe aortic valve stenosis. 10. Peak aortic velocity 10m/s. Mean 51mmHg. Severe AS. 11. The pulmonic valve was normal in structure. Pulmonic valve regurgitation is mild by color flow Doppler. 12. Moderately elevated pulmonary artery systolic pressure. 13. The inferior vena cava is normal in size with greater than 50% respiratory variability, suggesting right atrial pressure of 3 mmHg.   Past Medical History:  Diagnosis Date  . A-fib ([place])    a. Dx 01/2015, CHA2DS2VASc = 5-->coumadin started.  Marland Kitchen CAD (coronary artery disease)    a. 01/2015 NSTEMI/PCI: LM nl, LAD 30p, LCX nondom, 30-40p, 50d, OM1 100p (2.75x16 Rebel BMS), RCA mild diff plaque, EF 55%.  . Cancer ([place])   . Candida infection, disseminated ([place])   .  Dementia ([place])   . Dysrhythmia    Atrial fibrillation  . H/O echocardiogram    a. 01/2015 Echo: EF 50-55%, mild LVH, no rwma, mild MR, mildly dil LA, mod TR, PASP 53 mmHg.  Marland Kitchen Hyperlipidemia   . Hypertension   . Hypothyroid   . Myocardial infarction ([place])   . Osteoporosis   . Sleep apnea 04/2012   Mild/ AHI 10/CPAP 7cm h2o w/2 L o2    Past Surgical History:  Procedure Laterality Date  . CARDIAC CATHETERIZATION  11/10/2019   RIGHT/LEFT HEART CATH AND CORONARY ANGIOGRAPHY   . CARDIOVERSION N/A 08/22/2015   Procedure: CARDIOVERSION;  Surgeon: Skeet Latch, MD;  Location: [place];  Service: Cardiovascular;  Laterality: N/A;  . JOINT REPLACEMENT Left    knee  . KNEE SURGERY    . LEFT HEART CATHETERIZATION WITH CORONARY ANGIOGRAM N/A 02/20/2015   Procedure: LEFT HEART CATHETERIZATION WITH CORONARY ANGIOGRAM;  Surgeon: Jolaine Artist, MD;  Location: [facility] CATH LAB;  Service: Cardiovascular;  Laterality: N/A;  . PERCUTANEOUS CORONARY STENT INTERVENTION (PCI-S)  02/20/2015   Procedure: PERCUTANEOUS  CORONARY STENT INTERVENTION (PCI-S);  Surgeon: Jolaine Artist, MD;  Location: [facility] CATH LAB;  Service: Cardiovascular;;  OM1  (2.75/65mm Rebel)  . RIGHT/LEFT HEART CATH AND CORONARY ANGIOGRAPHY N/A 11/10/2019   Procedure: RIGHT/LEFT HEART CATH AND CORONARY ANGIOGRAPHY;  Surgeon: Burnell Blanks, MD;  Location: [place] CV LAB;  Service: Cardiovascular;  Laterality: N/A;  . TONSILLECTOMY      MEDICATIONS: . ALPRAZolam (XANAX) 0.25 MG tablet  . amLODipine (NORVASC) 5 MG tablet  . atorvastatin (LIPITOR) 80 MG tablet  . Calcium Citrate (CITRACAL PO)  . cholecalciferol (VITAMIN D) 1000 UNITS tablet  . clopidogrel (PLAVIX) 75 MG tablet  . cyanocobalamin 1000 MCG tablet  . levothyroxine (SYNTHROID) 137 MCG tablet  . lisinopril (ZESTRIL) 5 MG tablet  . metoprolol tartrate (LOPRESSOR) 50 MG tablet  . pantoprazole (PROTONIX) 40 MG tablet  . traMADol (ULTRAM) 50 MG tablet  .  warfarin (COUMADIN) 1 MG tablet   . denosumab (PROLIA) injection 60 mg    Myra Gianotti, PA-C Surgical Short Stay/Anesthesiology [facility] Phone [phone] [facility] Phone [phone] 11/29/2019 9:53 AM

## 2019-11-30 ENCOUNTER — Other Ambulatory Visit: Payer: Self-pay | Admitting: Physician Assistant

## 2019-11-30 ENCOUNTER — Encounter (HOSPITAL_COMMUNITY): Payer: Self-pay

## 2019-11-30 ENCOUNTER — Inpatient Hospital Stay (HOSPITAL_COMMUNITY): Payer: Medicare Other

## 2019-11-30 ENCOUNTER — Inpatient Hospital Stay (HOSPITAL_COMMUNITY): Payer: Medicare Other | Admitting: Vascular Surgery

## 2019-11-30 ENCOUNTER — Encounter (HOSPITAL_COMMUNITY)
Admission: RE | Disposition: A | Discharge status: Home / Self Care | Payer: Self-pay | Source: Home / Self Care | Attending: Cardiovascular Disease

## 2019-11-30 ENCOUNTER — Inpatient Hospital Stay (HOSPITAL_COMMUNITY)
Admission: RE | Admit: 2019-11-30 | Discharge: 2019-12-01 | DRG: 266 | Disposition: A | Discharge status: Home / Self Care | Payer: Medicare Other | Attending: Cardiovascular Disease | Admitting: Cardiovascular Disease

## 2019-11-30 ENCOUNTER — Other Ambulatory Visit: Payer: Self-pay

## 2019-11-30 ENCOUNTER — Inpatient Hospital Stay (HOSPITAL_COMMUNITY)
Admission: RE | Admit: 2019-11-30 | Discharge: 2019-11-30 | Disposition: A | Discharge status: Home / Self Care | Payer: Medicare Other | Source: Ambulatory Visit | Attending: Cardiovascular Disease | Admitting: Cardiovascular Disease

## 2019-11-30 DIAGNOSIS — I10 Essential (primary) hypertension: Secondary | ICD-10-CM | POA: Diagnosis not present

## 2019-11-30 DIAGNOSIS — Z7901 Long term (current) use of anticoagulants: Secondary | ICD-10-CM | POA: Diagnosis not present

## 2019-11-30 DIAGNOSIS — Z955 Presence of coronary angioplasty implant and graft: Secondary | ICD-10-CM | POA: Diagnosis not present

## 2019-11-30 DIAGNOSIS — G4730 Sleep apnea, unspecified: Secondary | ICD-10-CM | POA: Diagnosis not present

## 2019-11-30 DIAGNOSIS — Z8249 Family history of ischemic heart disease and other diseases of the circulatory system: Secondary | ICD-10-CM | POA: Diagnosis not present

## 2019-11-30 DIAGNOSIS — Z87891 Personal history of nicotine dependence: Secondary | ICD-10-CM | POA: Diagnosis not present

## 2019-11-30 DIAGNOSIS — F0390 Unspecified dementia without behavioral disturbance: Secondary | ICD-10-CM | POA: Diagnosis present

## 2019-11-30 DIAGNOSIS — E039 Hypothyroidism, unspecified: Secondary | ICD-10-CM | POA: Diagnosis not present

## 2019-11-30 DIAGNOSIS — Z6837 Body mass index (BMI) 37.0-37.9, adult: Secondary | ICD-10-CM | POA: Diagnosis not present

## 2019-11-30 DIAGNOSIS — E669 Obesity, unspecified: Secondary | ICD-10-CM | POA: Diagnosis present

## 2019-11-30 DIAGNOSIS — Z823 Family history of stroke: Secondary | ICD-10-CM

## 2019-11-30 DIAGNOSIS — E785 Hyperlipidemia, unspecified: Secondary | ICD-10-CM | POA: Diagnosis present

## 2019-11-30 DIAGNOSIS — I252 Old myocardial infarction: Secondary | ICD-10-CM | POA: Diagnosis not present

## 2019-11-30 DIAGNOSIS — Z006 Encounter for examination for normal comparison and control in clinical research program: Secondary | ICD-10-CM | POA: Diagnosis not present

## 2019-11-30 DIAGNOSIS — Z952 Presence of prosthetic heart valve: Secondary | ICD-10-CM

## 2019-11-30 DIAGNOSIS — M810 Age-related osteoporosis without current pathological fracture: Secondary | ICD-10-CM | POA: Diagnosis not present

## 2019-11-30 DIAGNOSIS — I4819 Other persistent atrial fibrillation: Secondary | ICD-10-CM | POA: Diagnosis present

## 2019-11-30 DIAGNOSIS — Z79890 Hormone replacement therapy (postmenopausal): Secondary | ICD-10-CM

## 2019-11-30 DIAGNOSIS — Z79899 Other long term (current) drug therapy: Secondary | ICD-10-CM | POA: Diagnosis not present

## 2019-11-30 DIAGNOSIS — I350 Nonrheumatic aortic (valve) stenosis: Secondary | ICD-10-CM | POA: Diagnosis not present

## 2019-11-30 DIAGNOSIS — J9811 Atelectasis: Secondary | ICD-10-CM | POA: Diagnosis not present

## 2019-11-30 DIAGNOSIS — R339 Retention of urine, unspecified: Secondary | ICD-10-CM | POA: Diagnosis not present

## 2019-11-30 DIAGNOSIS — I5033 Acute on chronic diastolic (congestive) heart failure: Secondary | ICD-10-CM | POA: Diagnosis present

## 2019-11-30 DIAGNOSIS — I2510 Atherosclerotic heart disease of native coronary artery without angina pectoris: Secondary | ICD-10-CM | POA: Diagnosis present

## 2019-11-30 DIAGNOSIS — Z888 Allergy status to other drugs, medicaments and biological substances status: Secondary | ICD-10-CM | POA: Diagnosis not present

## 2019-11-30 DIAGNOSIS — Z7902 Long term (current) use of antithrombotics/antiplatelets: Secondary | ICD-10-CM | POA: Diagnosis not present

## 2019-11-30 DIAGNOSIS — Z833 Family history of diabetes mellitus: Secondary | ICD-10-CM | POA: Diagnosis not present

## 2019-11-30 HISTORY — DX: Nonrheumatic aortic (valve) stenosis: I35.0

## 2019-11-30 HISTORY — PX: TEE WITHOUT CARDIOVERSION: SHX5443

## 2019-11-30 HISTORY — PX: TRANSCATHETER AORTIC VALVE REPLACEMENT, TRANSFEMORAL: SHX6400

## 2019-11-30 HISTORY — DX: Presence of prosthetic heart valve: Z95.2

## 2019-11-30 HISTORY — DX: Other persistent atrial fibrillation: I48.19

## 2019-11-30 LAB — POCT I-STAT, CHEM 8
BUN: 16 mg/dL (ref 8–23)
BUN: 15 mg/dL (ref 8–23)
BUN: 15 mg/dL (ref 8–23)
Calcium, Ion: 1.18 mmol/L (ref 1.15–1.40)
Calcium, Ion: 1.13 mmol/L — ABNORMAL LOW (ref 1.15–1.40)
Calcium, Ion: 1.16 mmol/L (ref 1.15–1.40)
Chloride: 105 mmol/L (ref 98–111)
Chloride: 106 mmol/L (ref 98–111)
Chloride: 107 mmol/L (ref 98–111)
Creatinine, Ser: 0.8 mg/dL (ref 0.44–1.00)
Creatinine, Ser: 0.8 mg/dL (ref 0.44–1.00)
Creatinine, Ser: 0.7 mg/dL (ref 0.44–1.00)
Glucose, Bld: 116 mg/dL — ABNORMAL HIGH (ref 70–99)
Glucose, Bld: 130 mg/dL — ABNORMAL HIGH (ref 70–99)
Glucose, Bld: 130 mg/dL — ABNORMAL HIGH (ref 70–99)
HCT: 34 % — ABNORMAL LOW (ref 36.0–46.0)
HCT: 34 % — ABNORMAL LOW (ref 36.0–46.0)
HCT: 34 % — ABNORMAL LOW (ref 36.0–46.0)
Hemoglobin: 11.6 g/dL — ABNORMAL LOW (ref 12.0–15.0)
Hemoglobin: 11.6 g/dL — ABNORMAL LOW (ref 12.0–15.0)
Hemoglobin: 11.6 g/dL — ABNORMAL LOW (ref 12.0–15.0)
Potassium: 4 mmol/L (ref 3.5–5.1)
Potassium: 3.9 mmol/L (ref 3.5–5.1)
Potassium: 4 mmol/L (ref 3.5–5.1)
Sodium: 141 mmol/L (ref 135–145)
Sodium: 140 mmol/L (ref 135–145)
Sodium: 141 mmol/L (ref 135–145)
TCO2: 25 mmol/L (ref 22–32)
TCO2: 26 mmol/L (ref 22–32)
TCO2: 23 mmol/L (ref 22–32)

## 2019-11-30 LAB — APTT: aPTT: 33 seconds (ref 24–36)

## 2019-11-30 LAB — PROTIME-INR
INR: 1.2 (ref 0.8–1.2)
Prothrombin Time: 15.1 seconds (ref 11.4–15.2)

## 2019-11-30 SURGERY — IMPLANTATION, AORTIC VALVE, TRANSCATHETER, FEMORAL APPROACH
Anesthesia: Monitor Anesthesia Care

## 2019-11-30 MED ORDER — PROTAMINE SULFATE 10 MG/ML IV SOLN
INTRAVENOUS | Status: AC
  Filled 2019-11-30: qty 25

## 2019-11-30 MED ORDER — ACETAMINOPHEN 325 MG PO TABS
650.0000 mg | ORAL_TABLET | Freq: Four times a day (QID) | ORAL | Status: DC | PRN
  Administered 2019-11-30: 650 mg via ORAL
  Filled 2019-11-30: qty 2

## 2019-11-30 MED ORDER — LIDOCAINE HCL 1 % IJ SOLN
INTRAMUSCULAR | Status: AC
  Filled 2019-11-30: qty 20

## 2019-11-30 MED ORDER — SODIUM CHLORIDE 0.9 % IV SOLN
INTRAVENOUS | Status: DC
  Administered 2019-11-30 (×2): via INTRAVENOUS

## 2019-11-30 MED ORDER — FENTANYL CITRATE (PF) 100 MCG/2ML IJ SOLN
INTRAMUSCULAR | Status: DC | PRN
  Administered 2019-11-30: 25 ug via INTRAVENOUS
  Administered 2019-11-30: 50 ug via INTRAVENOUS

## 2019-11-30 MED ORDER — CHLORHEXIDINE GLUCONATE 4 % EX LIQD: 60.0000 mL | Freq: Once | CUTANEOUS | Status: DC

## 2019-11-30 MED ORDER — SODIUM CHLORIDE 0.9 % IV SOLN
INTRAVENOUS | Status: DC | PRN
  Administered 2019-11-30: 500 mL via INTRAMUSCULAR

## 2019-11-30 MED ORDER — VANCOMYCIN HCL IN DEXTROSE 1-5 GM/200ML-% IV SOLN
1000.0000 mg | Freq: Once | INTRAVENOUS | Status: AC
  Administered 2019-12-01: 1000 mg via INTRAVENOUS
  Filled 2019-11-30: qty 200

## 2019-11-30 MED ORDER — CHLORHEXIDINE GLUCONATE 0.12 % MT SOLN: 15.0000 mL | Freq: Once | OROMUCOSAL | Status: DC

## 2019-11-30 MED ORDER — ROCURONIUM BROMIDE 10 MG/ML (PF) SYRINGE
PREFILLED_SYRINGE | INTRAVENOUS | Status: AC
  Filled 2019-11-30: qty 10

## 2019-11-30 MED ORDER — SODIUM CHLORIDE 0.9 % IV SOLN
INTRAVENOUS | Status: AC
  Filled 2019-11-30: qty 1.2

## 2019-11-30 MED ORDER — LACTATED RINGERS IV SOLN
INTRAVENOUS | Status: DC | PRN
  Administered 2019-11-30: 12:00:00 via INTRAVENOUS

## 2019-11-30 MED ORDER — IODIXANOL 320 MG/ML IV SOLN
INTRAVENOUS | Status: DC | PRN
  Administered 2019-11-30: 50.7 mL

## 2019-11-30 MED ORDER — SODIUM CHLORIDE 0.9 % IV SOLN
INTRAVENOUS | Status: AC
  Filled 2019-11-30 (×3): qty 1.2

## 2019-11-30 MED ORDER — ATORVASTATIN CALCIUM 80 MG PO TABS: 80.0000 mg | ORAL_TABLET | Freq: Every day | ORAL | Status: DC

## 2019-11-30 MED ORDER — ACETAMINOPHEN 325 MG PO TABS
ORAL_TABLET | ORAL | Status: AC
  Filled 2019-11-30: qty 2

## 2019-11-30 MED ORDER — LACTATED RINGERS IV SOLN
INTRAVENOUS | Status: DC
  Administered 2019-11-30: 10:00:00 via INTRAVENOUS

## 2019-11-30 MED ORDER — TRAMADOL HCL 50 MG PO TABS: 50.0000 mg | ORAL_TABLET | Freq: Three times a day (TID) | ORAL | Status: DC | PRN

## 2019-11-30 MED ORDER — FENTANYL CITRATE (PF) 250 MCG/5ML IJ SOLN
INTRAMUSCULAR | Status: AC
  Filled 2019-11-30: qty 5

## 2019-11-30 MED ORDER — LIDOCAINE 2% (20 MG/ML) 5 ML SYRINGE
INTRAMUSCULAR | Status: DC | PRN
  Administered 2019-11-30: 40 mg via INTRAVENOUS

## 2019-11-30 MED ORDER — CLOPIDOGREL BISULFATE 75 MG PO TABS
75.0000 mg | ORAL_TABLET | Freq: Every day | ORAL | Status: DC
  Administered 2019-11-30 – 2019-12-01 (×2): 75 mg via ORAL
  Filled 2019-11-30 (×2): qty 1

## 2019-11-30 MED ORDER — LEVOTHYROXINE SODIUM 25 MCG PO TABS
137.0000 ug | ORAL_TABLET | Freq: Every day | ORAL | Status: DC
  Administered 2019-12-01: 137 ug via ORAL
  Filled 2019-11-30: qty 1

## 2019-11-30 MED ORDER — CHLORHEXIDINE GLUCONATE 4 % EX LIQD: 30.0000 mL | CUTANEOUS | Status: DC

## 2019-11-30 MED ORDER — HEPARIN SODIUM (PORCINE) 1000 UNIT/ML IJ SOLN
INTRAMUSCULAR | Status: DC | PRN
  Administered 2019-11-30: 12000 [IU] via INTRAVENOUS

## 2019-11-30 MED ORDER — ASPIRIN 81 MG PO CHEW
81.0000 mg | CHEWABLE_TABLET | Freq: Every day | ORAL | Status: DC
  Administered 2019-12-01: 81 mg via ORAL
  Filled 2019-11-30: qty 1

## 2019-11-30 MED ORDER — LIDOCAINE HCL 1 % IJ SOLN
INTRAMUSCULAR | Status: DC | PRN
  Administered 2019-11-30: 10 mL

## 2019-11-30 MED ORDER — SODIUM CHLORIDE 0.9 % IV SOLN
1.5000 g | Freq: Two times a day (BID) | INTRAVENOUS | Status: DC
  Administered 2019-11-30 – 2019-12-01 (×2): 1.5 g via INTRAVENOUS
  Filled 2019-11-30 (×2): qty 1.5

## 2019-11-30 MED ORDER — TRAMADOL HCL 50 MG PO TABS: 50.0000 mg | ORAL_TABLET | ORAL | Status: DC | PRN

## 2019-11-30 MED ORDER — ONDANSETRON HCL 4 MG/2ML IJ SOLN
INTRAMUSCULAR | Status: DC | PRN
  Administered 2019-11-30: 4 mg via INTRAVENOUS

## 2019-11-30 MED ORDER — PHENYLEPHRINE HCL-NACL 20-0.9 MG/250ML-% IV SOLN
0.0000 ug/min | INTRAVENOUS | Status: DC
  Filled 2019-11-30: qty 250

## 2019-11-30 MED ORDER — PROTAMINE SULFATE 10 MG/ML IV SOLN
INTRAVENOUS | Status: DC | PRN
  Administered 2019-11-30: 120 mg via INTRAVENOUS

## 2019-11-30 MED ORDER — LIDOCAINE 2% (20 MG/ML) 5 ML SYRINGE
INTRAMUSCULAR | Status: AC
  Filled 2019-11-30: qty 5

## 2019-11-30 MED ORDER — ROCURONIUM BROMIDE 100 MG/10ML IV SOLN
INTRAVENOUS | Status: DC | PRN
  Administered 2019-11-30: 50 mg via INTRAVENOUS
  Administered 2019-11-30: 10 mg via INTRAVENOUS

## 2019-11-30 MED ORDER — DEXMEDETOMIDINE HCL 200 MCG/2ML IV SOLN
INTRAVENOUS | Status: DC | PRN
  Administered 2019-11-30: 78.6 ug via INTRAVENOUS

## 2019-11-30 MED ORDER — MORPHINE SULFATE (PF) 10 MG/ML IV SOLN: 1.0000 mg | INTRAVENOUS | Status: DC | PRN

## 2019-11-30 MED ORDER — PANTOPRAZOLE SODIUM 40 MG PO TBEC
40.0000 mg | DELAYED_RELEASE_TABLET | Freq: Every day | ORAL | Status: DC
  Administered 2019-12-01: 40 mg via ORAL
  Filled 2019-11-30: qty 1

## 2019-11-30 MED ORDER — SODIUM CHLORIDE 0.9 % IV SOLN: 250.0000 mL | INTRAVENOUS | Status: DC | PRN

## 2019-11-30 MED ORDER — PROPOFOL 10 MG/ML IV BOLUS
INTRAVENOUS | Status: DC | PRN
  Administered 2019-11-30: 80 mg via INTRAVENOUS

## 2019-11-30 MED ORDER — PROPOFOL 10 MG/ML IV BOLUS
INTRAVENOUS | Status: AC
  Filled 2019-11-30: qty 20

## 2019-11-30 MED ORDER — ACETAMINOPHEN 650 MG RE SUPP
650.0000 mg | Freq: Four times a day (QID) | RECTAL | Status: DC | PRN
  Administered 2019-11-30: 650 mg via RECTAL

## 2019-11-30 MED ORDER — SUGAMMADEX SODIUM 200 MG/2ML IV SOLN
INTRAVENOUS | Status: DC | PRN
  Administered 2019-11-30: 160 mg via INTRAVENOUS

## 2019-11-30 MED ORDER — HEPARIN SODIUM (PORCINE) 1000 UNIT/ML IJ SOLN
INTRAMUSCULAR | Status: AC
  Filled 2019-11-30: qty 1

## 2019-11-30 MED ORDER — SODIUM CHLORIDE 0.9% FLUSH
3.0000 mL | Freq: Two times a day (BID) | INTRAVENOUS | Status: DC
  Administered 2019-12-01: 3 mL via INTRAVENOUS

## 2019-11-30 MED ORDER — SODIUM CHLORIDE 0.9 % IV SOLN: INTRAVENOUS | Status: AC

## 2019-11-30 MED ORDER — ALPRAZOLAM 0.25 MG PO TABS
0.2500 mg | ORAL_TABLET | Freq: Every day | ORAL | Status: DC
  Administered 2019-11-30: 0.25 mg via ORAL
  Filled 2019-11-30: qty 1

## 2019-11-30 MED ORDER — ONDANSETRON HCL 4 MG/2ML IJ SOLN: 4.0000 mg | Freq: Four times a day (QID) | INTRAMUSCULAR | Status: DC | PRN

## 2019-11-30 MED ORDER — DEXMEDETOMIDINE HCL IN NACL 400 MCG/100ML IV SOLN
INTRAVENOUS | Status: DC | PRN
  Administered 2019-11-30: 1 ug/kg/h via INTRAVENOUS

## 2019-11-30 MED ORDER — SODIUM CHLORIDE 0.9% FLUSH: 3.0000 mL | INTRAVENOUS | Status: DC | PRN

## 2019-11-30 MED ORDER — NITROGLYCERIN IN D5W 200-5 MCG/ML-% IV SOLN: 0.0000 ug/min | INTRAVENOUS | Status: DC

## 2019-11-30 MED ORDER — OXYCODONE HCL 5 MG PO TABS: 5.0000 mg | ORAL_TABLET | ORAL | Status: DC | PRN

## 2019-11-30 SURGICAL SUPPLY — 83 items
BAG DECANTER FOR FLEXI CONT (MISCELLANEOUS) IMPLANT
BAG SNAP BAND KOVER 36X36 (MISCELLANEOUS) IMPLANT
BLADE CLIPPER SURG (BLADE) IMPLANT
BLADE OSCILLATING /SAGITTAL (BLADE) IMPLANT
BLADE STERNUM SYSTEM 6 (BLADE) IMPLANT
BLADE SURG 10 STRL SS (BLADE) IMPLANT
CABLE ADAPT CONN TEMP 6FT (ADAPTER) ×3 IMPLANT
CATH DIAG EXPO 6F AL1 (CATHETERS) IMPLANT
CATH DIAG EXPO 6F FR4 (CATHETERS) ×3 IMPLANT
CATH DIAG EXPO 6F VENT PIG 145 (CATHETERS) ×6 IMPLANT
CATH EXTERNAL FEMALE PUREWICK (CATHETERS) IMPLANT
CATH INFINITI 6F AL2 (CATHETERS) IMPLANT
CATH S G BIP PACING (CATHETERS) ×3 IMPLANT
CHLORAPREP W/TINT 26 (MISCELLANEOUS) ×3 IMPLANT
CLIP VESOCCLUDE MED 24/CT (CLIP) IMPLANT
CLIP VESOCCLUDE SM WIDE 24/CT (CLIP) IMPLANT
CLOSURE MYNX CONTROL 6F/7F (Vascular Products) ×3 IMPLANT
CONT SPEC 4OZ CLIKSEAL STRL BL (MISCELLANEOUS) ×6 IMPLANT
COVER BACK TABLE 80X110 HD (DRAPES) ×3 IMPLANT
COVER DOME SNAP 22 D (MISCELLANEOUS) ×3 IMPLANT
COVER WAND RF STERILE (DRAPES) IMPLANT
DECANTER SPIKE VIAL GLASS SM (MISCELLANEOUS) ×3 IMPLANT
DERMABOND ADVANCED (GAUZE/BANDAGES/DRESSINGS) ×4
DERMABOND ADVANCED .7 DNX12 (GAUZE/BANDAGES/DRESSINGS) ×2 IMPLANT
DEVICE CLOSURE PERCLS PRGLD 6F (VASCULAR PRODUCTS) ×3 IMPLANT
DRAPE INCISE IOBAN 66X45 STRL (DRAPES) IMPLANT
DRSG TEGADERM 4X4.75 (GAUZE/BANDAGES/DRESSINGS) ×6 IMPLANT
ELECT CAUTERY BLADE 6.4 (BLADE) IMPLANT
ELECT REM PT RETURN 9FT ADLT (ELECTROSURGICAL) ×6
ELECTRODE REM PT RTRN 9FT ADLT (ELECTROSURGICAL) ×2 IMPLANT
FELT TEFLON 6X6 (MISCELLANEOUS) IMPLANT
GAUZE SPONGE 4X4 12PLY STRL (GAUZE/BANDAGES/DRESSINGS) ×3 IMPLANT
GAUZE SPONGE 4X4 12PLY STRL LF (GAUZE/BANDAGES/DRESSINGS) IMPLANT
GLOVE BIO SURGEON STRL SZ7.5 (GLOVE) ×3 IMPLANT
GLOVE BIO SURGEON STRL SZ8 (GLOVE) IMPLANT
GLOVE EUDERMIC 7 POWDERFREE (GLOVE) IMPLANT
GLOVE ORTHO TXT STRL SZ7.5 (GLOVE) IMPLANT
GOWN STRL REUS W/ TWL LRG LVL3 (GOWN DISPOSABLE) IMPLANT
GOWN STRL REUS W/ TWL XL LVL3 (GOWN DISPOSABLE) ×1 IMPLANT
GOWN STRL REUS W/TWL LRG LVL3 (GOWN DISPOSABLE)
GOWN STRL REUS W/TWL XL LVL3 (GOWN DISPOSABLE) ×2
GUIDEWIRE SAFE TJ AMPLATZ EXST (WIRE) IMPLANT
INSERT FOGARTY SM (MISCELLANEOUS) IMPLANT
KIT BASIN OR (CUSTOM PROCEDURE TRAY) ×3 IMPLANT
KIT HEART LEFT (KITS) ×3 IMPLANT
KIT SUCTION CATH 14FR (SUCTIONS) IMPLANT
KIT TURNOVER KIT B (KITS) ×3 IMPLANT
LOOP VESSEL MAXI BLUE (MISCELLANEOUS) IMPLANT
LOOP VESSEL MINI RED (MISCELLANEOUS) IMPLANT
NS IRRIG 1000ML POUR BTL (IV SOLUTION) ×3 IMPLANT
PACK ENDO MINOR (CUSTOM PROCEDURE TRAY) ×3 IMPLANT
PAD ARMBOARD 7.5X6 YLW CONV (MISCELLANEOUS) ×6 IMPLANT
PAD ELECT DEFIB RADIOL ZOLL (MISCELLANEOUS) ×3 IMPLANT
PENCIL BUTTON HOLSTER BLD 10FT (ELECTRODE) IMPLANT
PERCLOSE PROGLIDE 6F (VASCULAR PRODUCTS) ×9
POSITIONER HEAD DONUT 9IN (MISCELLANEOUS) ×3 IMPLANT
SET MICROPUNCTURE 5F STIFF (MISCELLANEOUS) ×6 IMPLANT
SHEATH BRITE TIP 7FR 35CM (SHEATH) ×3 IMPLANT
SHEATH PINNACLE 6F 10CM (SHEATH) ×6 IMPLANT
SHEATH PINNACLE 8F 10CM (SHEATH) ×3 IMPLANT
SLEEVE REPOSITIONING LENGTH 30 (MISCELLANEOUS) ×3 IMPLANT
STOPCOCK MORSE 400PSI 3WAY (MISCELLANEOUS) ×6 IMPLANT
SUT ETHIBOND X763 2 0 SH 1 (SUTURE) IMPLANT
SUT GORETEX CV 4 TH 22 36 (SUTURE) IMPLANT
SUT GORETEX CV4 TH-18 (SUTURE) IMPLANT
SUT MNCRL AB 3-0 PS2 18 (SUTURE) IMPLANT
SUT PROLENE 5 0 C 1 36 (SUTURE) IMPLANT
SUT PROLENE 6 0 C 1 30 (SUTURE) IMPLANT
SUT SILK  1 MH (SUTURE) ×2
SUT SILK 1 MH (SUTURE) ×1 IMPLANT
SUT VIC AB 2-0 CT1 27 (SUTURE)
SUT VIC AB 2-0 CT1 TAPERPNT 27 (SUTURE) IMPLANT
SUT VIC AB 2-0 CTX 36 (SUTURE) IMPLANT
SUT VIC AB 3-0 SH 8-18 (SUTURE) IMPLANT
SYR 50ML LL SCALE MARK (SYRINGE) ×3 IMPLANT
SYR BULB IRRIGATION 50ML (SYRINGE) IMPLANT
SYR MEDRAD MARK V 150ML (SYRINGE) ×3 IMPLANT
TOWEL GREEN STERILE (TOWEL DISPOSABLE) ×6 IMPLANT
TRANSDUCER W/STOPCOCK (MISCELLANEOUS) ×6 IMPLANT
TRAY FOLEY SLVR 16FR TEMP STAT (SET/KITS/TRAYS/PACK) IMPLANT
VALVE 23 ULTRA SAPIEN KIT (Valve) ×3 IMPLANT
WIRE EMERALD 3MM-J .035X150CM (WIRE) ×6 IMPLANT
WIRE EMERALD 3MM-J .035X260CM (WIRE) ×3 IMPLANT

## 2019-11-30 NOTE — Anesthesia Procedure Notes (Signed)
Procedure Name: Intubation Date/Time: 11/30/2019 12:53 PM Performed by: Amadeo Garnet, CRNA Pre-anesthesia Checklist: Emergency Drugs available, Patient identified, Suction available, Patient being monitored and Timeout performed Patient Re-evaluated:Patient Re-evaluated prior to induction Oxygen Delivery Method: Circle system utilized Preoxygenation: Pre-oxygenation with 100% oxygen Ventilation: Mask ventilation without difficulty Laryngoscope Size: Mac and 3 Grade View: Grade I Tube type: Oral Tube size: 7.0 mm Number of attempts: 1 Airway Equipment and Method: Stylet Placement Confirmation: ETT inserted through vocal cords under direct vision,  positive ETCO2 and breath sounds checked- equal and bilateral Secured at: 22 cm Tube secured with: Tape Dental Injury: Teeth and Oropharynx as per pre-operative assessment

## 2019-11-30 NOTE — Progress Notes (Signed)
Patient arrived from PACU after TAVR w/[place] to 4E room 14.  Telemetry monitor applied and CCMD notified.  CHG bath completed.  Patient oriented to unit and room to include call light and phone.  Pt bladder scanned on arrival revealing 296 ml in bladder.  Pt educated as to the use of the purewick and subsequently urinated 100 ml.  Will continue to monitor.

## 2019-11-30 NOTE — Progress Notes (Signed)
  Echocardiogram Echocardiogram Transesophageal has been performed.  [name] 11/30/2019, 2:57 PM

## 2019-11-30 NOTE — Anesthesia Postprocedure Evaluation (Signed)
Anesthesia Post Note  Patient: [name]  Procedure(s) Performed: TRANSCATHETER AORTIC VALVE REPLACEMENT, TRANSFEMORAL (N/A ) TRANSESOPHAGEAL ECHOCARDIOGRAM (TEE) (N/A )     Patient location during evaluation: Cath Lab Anesthesia Type: General Level of consciousness: confused, awake and sedated Pain management: pain level controlled Vital Signs Assessment: post-procedure vital signs reviewed and stable Respiratory status: spontaneous breathing, nonlabored ventilation, respiratory function stable and patient connected to nasal cannula oxygen Cardiovascular status: blood pressure returned to baseline and stable Postop Assessment: no apparent nausea or vomiting Anesthetic complications: no    Last Vitals:  Vitals:   11/30/19 1435 11/30/19 1511  BP:  (!) 116/59  Pulse: (!) 127 (!) 57  Resp:  14  Temp:  (!) 36.2 C  SpO2:      Last Pain:  Vitals:   11/30/19 1511  TempSrc: Temporal  PainSc: 0-No pain                 Maevyn Riordan COKER

## 2019-11-30 NOTE — Op Note (Signed)
[facility]   MULTIDISCIPLINARY HEART VALVE TEAM   TAVR OPERATIVE NOTE   Date of Procedure:  11/30/2019  Preoperative Diagnosis: Severe Aortic Stenosis   Postoperative Diagnosis: Same   Procedure:    Transcatheter Aortic Valve Replacement - Percutaneous Right Transfemoral Approach  Edwards Sapien 3 Ultra THV (size 23 mm, model # 9750TFX, serial # [id number])   Co-Surgeons:  Gaye Pollack, MD and Lauree Chandler, MD   Anesthesiologist:  Roberts Gaudy, MD  Echocardiographer:  Ena Dawley, MD  Pre-operative Echo Findings:  Severe aortic stenosis  Normal left ventricular systolic function  Post-operative Echo Findings:  Trivial paravalvular leak  Normal left ventricular systolic function   BRIEF CLINICAL NOTE AND INDICATIONS FOR SURGERY  This 85 year old woman has stage D, severe, symptomatic aortic stenosis with New York Heart Association class III symptoms of exertional fatigue and shortness of breath consistent with chronic diastolic congestive heart failure. She is chronically deconditioned and not very active due to obesity and arthritis, requiring a walker for ambulation. I have personally reviewed her 2D echocardiogram, cardiac catheterization, and CTA studies. Her echocardiogram shows a trileaflet aortic valve with severe calcification and restricted mobility with a mean gradient of 40.5 mmHg and a peak gradient of 60.2 mmHg. Left ventricular systolic function is normal. Cardiac catheterization showed a patent stent in the obtuse marginal branch and otherwise mild nonobstructive disease. The mean gradient across aortic valve was measured at 35 mmHg with a calculated valve area of 0.72 cm. It is difficult to tell how significant her symptoms are due to her decreased mobility and some dementia but her daughter said that she does get short of breath with walking in the house and sometimes at rest. I do not think she would be a candidate for open  surgical aortic valve replacement for transcatheter aortic valve replacement seems to be a reasonable alternative. Her gated cardiac CTA shows anatomy suitable for transcatheter aortic valve replacement using a Sapien 3 valve. Her abdominal and pelvic CTA shows adequate pelvic vascular anatomy to allow transfemoral insertion.  The patient and her daughter were counseled at length regarding treatment alternatives for management of severe symptomatic aortic stenosis. The risks and benefits of surgical intervention has been discussed in detail. Long-term prognosis with medical therapy was discussed. Alternative approaches such as conventional surgical aortic valve replacement, transcatheter aortic valve replacement, and palliative medical therapy were compared and contrasted at length. This discussion was placed in the context of the patient's own specific clinical presentation and past medical history. All of their questions have been addressed.  Following the decision to proceed with transcatheter aortic valve replacement, a discussion was held regarding what types of management strategies would be attempted intraoperatively in the event of life-threatening complications, including whether or not the patient would be considered a candidate for the use of cardiopulmonary bypass and/or conversion to open sternotomy for attempted surgical intervention. I do not think she would be a candidate for emergent sternotomy to manage any intraoperative complications. The patient has been advised of a variety of complications that might develop including but not limited to risks of death, stroke, paravalvular leak, aortic dissection or other major vascular complications, aortic annulus rupture, device embolization, cardiac rupture or perforation, mitral regurgitation, acute myocardial infarction, arrhythmia, heart block or bradycardia requiring permanent pacemaker placement, congestive heart failure, respiratory failure, renal  failure, pneumonia, infection, other late complications related to structural valve deterioration or migration, or other complications that might ultimately cause a temporary or permanent loss of  functional independence or other long term morbidity. The patient and her daughter provide full informed consent for the procedure as described and all questions were answered.     DETAILS OF THE OPERATIVE PROCEDURE  PREPARATION:    The patient is brought to the operating room on the above mentioned date and appropriate monitoring was established by the anesthesia team. The patient is placed in the supine position on the operating table.  Intravenous antibiotics are administered.  General endotracheal anesthesia is induced uneventfully.  A Foley catheter is placed.  Baseline transesophageal echocardiogram was performed. The patient's chest, abdomen, and both groins are prepared and draped in a sterile manner. A time out procedure is performed.   PERIPHERAL ACCESS:    Using the modified Seldinger technique, femoral arterial access was obtained with placement of a 6 Fr sheath on the left side.  Femoral venous access was obtained with placement of a 6 Fr sheath on the right side. A pigtail diagnostic catheter was passed through the left arterial sheath under fluoroscopic guidance into the aortic root.  A temporary transvenous pacemaker catheter was passed through the right femoral venous sheath under fluoroscopic guidance into the right ventricle.  The pacemaker was tested to ensure stable lead placement and pacemaker capture. Aortic root angiography was performed in order to determine the optimal angiographic angle for valve deployment.   TRANSFEMORAL ACCESS:   Percutaneous transfemoral access and sheath placement was performed using ultrasound guidance.  The right common femoral artery was cannulated using a micropuncture needle and appropriate location was verified using hand injection angiogram.  A pair  of Abbott Perclose percutaneous closure devices were placed and a 6 French sheath replaced into the femoral artery.  The patient was heparinized systemically and ACT verified > 250 seconds.    A 14 Fr transfemoral E-sheath was introduced into the right common femoral artery after progressively dilating over an Amplatz superstiff wire. A JR-4 catheter was used to direct a straight-tip exchange length wire across the native aortic valve into the left ventricle. This was exchanged out for a pigtail catheter and position was confirmed in the LV apex. Simultaneous LV and Ao pressures were recorded.  The pigtail catheter was exchanged for an Amplatz Extra-stiff wire in the LV apex.  Echocardiography was utilized to confirm appropriate wire position and no sign of entanglement in the mitral subvalvular apparatus.   BALLOON AORTIC VALVULOPLASTY:   Not performed   TRANSCATHETER HEART VALVE DEPLOYMENT:   An Edwards Sapien 3 Ultra transcatheter heart valve (size 23 mm, model #9750TFX, serial [id number]) was prepared and crimped per manufacturer's guidelines, and the proper orientation of the valve is confirmed on the [employer] delivery system. The valve was advanced through the introducer sheath using normal technique until in an appropriate position in the abdominal aorta beyond the sheath tip. The balloon was then retracted and using the fine-tuning wheel was centered on the valve. The valve was then advanced across the aortic arch using appropriate flexion of the catheter. The valve was carefully positioned across the aortic valve annulus. The Commander catheter was retracted using normal technique. Once final position of the valve has been confirmed by angiographic assessment, the valve is deployed while temporarily holding ventilation and during rapid ventricular pacing to maintain systolic blood pressure < 50 mmHg and pulse pressure < 10 mmHg. The balloon inflation is held for >3 seconds after reaching  full deployment volume. Once the balloon has fully deflated the balloon is retracted into the ascending aorta and  valve function is assessed using echocardiography. There is felt to be trivial paravalvular leak and no central aortic insufficiency.  The patient's hemodynamic recovery following valve deployment is good.  The deployment balloon and guidewire are both removed.    PROCEDURE COMPLETION:   The sheath was removed and femoral artery closure performed.  Protamine was administered once femoral arterial repair was complete. The temporary pacemaker, pigtail catheters and femoral sheaths were removed with manual pressure used for hemostasis.  A Mynx femoral closure device was utilized following removal of the diagnostic sheath in the left femoral artery.  The patient tolerated the procedure well and is transported to the cath lab recovery area in stable condition. There were no immediate intraoperative complications. All sponge instrument and needle counts are verified correct at completion of the operation.   No blood products were administered during the operation.  The patient received a total of 50.7 mL of intravenous contrast during the procedure.   Gaye Pollack, MD 11/30/2019

## 2019-11-30 NOTE — Progress Notes (Signed)
@  approx 2030 pt pulled out R FA IV 2/2 confusion. Pt endorsed she was leaving and she meant to pull it out. Pt anxious but reoriented to hospitalization. Pt requested her children be called. @2048  this RN called pt's daughter, Gelene Mink. Santiago Glad confirmed pt does sundown 2/2 her baseline dementia and referred me to her brother, Barnabas Lister, for reorientation over the phone. @2050  this RN called pt's son, Barnabas Lister, and he spoke with pt to calm her. Pt remains disoriented but pleasant and less anxious. Pt endorsed she would remain in bed and call if assistance needed. Bed alarm set and room door left open to maintain pt safety. Will continue to monitor and assess.

## 2019-11-30 NOTE — Transfer of Care (Signed)
Immediate Anesthesia Transfer of Care Note  Patient: [name]  Procedure(s) Performed: TRANSCATHETER AORTIC VALVE REPLACEMENT, TRANSFEMORAL (N/A ) TRANSESOPHAGEAL ECHOCARDIOGRAM (TEE) (N/A )  Patient Location: PACU  Anesthesia Type:General  Level of Consciousness: awake, alert  and oriented  Airway & Oxygen Therapy: Patient Spontanous Breathing and Patient connected to nasal cannula oxygen  Post-op Assessment: Report given to RN, Post -op Vital signs reviewed and stable and Patient moving all extremities  Post vital signs: Reviewed and stable  Last Vitals:  Vitals Value Taken Time  BP    Temp    Pulse    Resp    SpO2      Last Pain:  Vitals:   11/30/19 0910  TempSrc:   PainSc: 0-No pain         Complications: No apparent anesthesia complications

## 2019-11-30 NOTE — Discharge Instructions (Signed)
ACTIVITY AND EXERCISE °• Daily activity and exercise are an important part of your recovery. People recover at different rates depending on their general health and type of valve procedure. °• Most people recovering from TAVR feel better relatively quickly  °• No lifting, pushing, pulling more than 10 pounds (examples to avoid: groceries, vacuuming, gardening, golfing): °            - For one week with a procedure through the groin. °            - For six weeks for procedures through the chest wall or neck. °NOTE: You will typically see one of our providers 7-14 days after your procedure to discuss WHEN TO RESUME the above activities.  °  °  °DRIVING °• Do not drive until you are seen for follow up and cleared by a provider. Generally, we ask patient to not drive for 1 week after their procedure. °• If you have been told by your doctor in the past that you may not drive, you must talk with him/her before you begin driving again. °  °DRESSING °• Groin site: you may leave the clear dressing over the site for up to one week or until it falls off. °  °HYGIENE °• If you had a femoral (leg) procedure, you may take a shower when you return home. After the shower, pat the site dry. Do NOT use powder, oils or lotions in your groin area until the site has completely healed. °• If you had a chest procedure, you may shower when you return home unless specifically instructed not to by your discharging practitioner. °            - DO NOT scrub incision; pat dry with a towel. °            - DO NOT apply any lotions, oils, powders to the incision. °            - No tub baths / swimming for at least 2 weeks. °• If you notice any fevers, chills, increased pain, swelling, bleeding or pus, please contact your doctor. °  °ADDITIONAL INFORMATION °• If you are going to have an upcoming dental procedure, please contact our office as you will require antibiotics ahead of time to prevent infection on your heart valve.  ° ° °If you have any  questions or concerns you can call the structural heart phone during normal business hours 8am-4pm. If you have an urgent need after hours or weekends please call [phone] to talk to the on call provider for general cardiology. If you have an emergency that requires immediate attention, please call 911.  ° ° °After TAVR Checklist ° °Check  Test Description  ° Follow up appointment in 1-2 weeks  You will see our structural heart physician assistant, Katie Etana Beets. Your incision sites will be checked and you will be cleared to drive and resume all normal activities if you are doing well.    ° 1 month echo and follow up  You will have an echo to check on your new heart valve and be seen back in the office by Katie Tedric Leeth. Many times the echo is not read by your appointment time, but Katie will call you later that day or the following day to report your results.  ° Follow up with your primary cardiologist You will need to be seen by your primary cardiologist in the following 3-6 months after your 1 month appointment in the valve   clinic. Often times your Plavix or Aspirin will be discontinued during this time, but this is decided on a case by case basis.   ° 1 year echo and follow up You will have another echo to check on your heart valve after 1 year and be seen back in the office by Katie Laurette Villescas. This your last structural heart visit.  ° Bacterial endocarditis prophylaxis  You will have to take antibiotics for the rest of your life before all dental procedures (even teeth cleanings) to protect your heart valve. Antibiotics are also required before some surgeries. Please check with your cardiologist before scheduling any surgeries. Also, please make sure to tell us if you have a penicillin allergy as you will require an alternative antibiotic.   ° ° °

## 2019-11-30 NOTE — Anesthesia Procedure Notes (Signed)
Arterial Line Insertion Start/End12/12/2018 9:53 AM, 11/30/2019 9:53 AM Performed by: Renato Shin, CRNA, CRNA  Patient location: Pre-op. Preanesthetic checklist: patient identified, IV checked, site marked, risks and benefits discussed, surgical consent, monitors and equipment checked, pre-op evaluation, timeout performed and anesthesia consent Lidocaine 1% used for infiltration Right, radial was placed Catheter size: 18 G Hand hygiene performed , maximum sterile barriers used  and Seldinger technique used Allen's test indicative of satisfactory collateral circulation Attempts: 1 Procedure performed without using ultrasound guided technique. Following insertion, dressing applied and Biopatch. Post procedure assessment: normal  Patient tolerated the procedure well with no immediate complications.

## 2019-11-30 NOTE — Progress Notes (Signed)
Patient complaining of needing to urinate and placed on bedpan multiple times with no success.  Notified Nell Range and received verbal order for an in and out cath.  Two nurses attempted in and out cath with no success but patient did urinate during attempted procedure.  Katie notified that in and out cath was not successful but patient stated the felt better after urinating.  Patient was again placed on bedpan and repositioned.    Nurse spoke with daughter and daughter stated that patient has this issue some times at home - that patient will make several attempts to urinate with no success.

## 2019-11-30 NOTE — Interval H&P Note (Signed)
History and Physical Interval Note:  11/30/2019 12:51 PM  [name]  has presented today for surgery, with the diagnosis of Severe Aortic Stenosis.  The various methods of treatment have been discussed with the patient and family. After consideration of risks, benefits and other options for treatment, the patient has consented to  Procedure(s): TRANSCATHETER AORTIC VALVE REPLACEMENT, TRANSFEMORAL (N/A) TRANSESOPHAGEAL ECHOCARDIOGRAM (TEE) (N/A) as a surgical intervention.  The patient's history has been reviewed, patient examined, no change in status, stable for surgery.  I have reviewed the patient's chart and labs.  Questions were answered to the patient's satisfaction.     Gaye Pollack

## 2019-11-30 NOTE — CV Procedure (Signed)
[facility]  TAVR OPERATIVE NOTE   Date of Procedure:  11/30/2019  Preoperative Diagnosis: Severe Aortic Stenosis   Postoperative Diagnosis: Same   Procedure:    Transcatheter Aortic Valve Replacement - Transfemoral Approach  Edwards Sapien 3 THV (size 23 mm, model # [id number], serial # [id number])   Co-Surgeons:  Lauree Chandler, MD and Gaye Pollack, MD   Anesthesiologist:  Linna Caprice  Echocardiographer:  Meda Coffee  Pre-operative Echo Findings:  Severe aortic stenosis  Normal left ventricular systolic function  Post-operative Echo Findings:  Trivial paravalvular leak  Normal left ventricular systolic function  BRIEF CLINICAL NOTE AND INDICATIONS FOR SURGERY  85 yo with history of HTN, hyperlipidemia, hypothyroidism, sleep apnea, dementia, osteoporosis, persistent atrial fibrillation, CAD and severe aortic stenosis. She is known to have CAD and had an obtuse marginal treated with a bare metal stent in February 2016. Mild disease in the LAD and RCA. She has persistent atrial fibrillation and has been on coumadin. She has failed previous cardioversion attempts. She has been followed for moderate aortic stenosis for several years. Most recent echo 10/04/19 with LVEF=60-65%. The aortic valve leaflets are thickened with poor leaflet excursion, mean gradient 47 mmHg, peak gradient 64 mmHg, AVA 0.69 cm2, dimensionless index 0.18. Cardiac cath 11/10/19 with widely patent OM stent and mild disease in the LAD and RCA.   During the course of the patient's preoperative work up they have been evaluated comprehensively by a multidisciplinary team of specialists coordinated through the [facility] in the [facility].  They have been demonstrated to suffer from symptomatic severe aortic stenosis as noted above. The patient has been counseled extensively as to the relative risks and benefits of all options for the treatment of severe  aortic stenosis including long term medical therapy, conventional surgery for aortic valve replacement, and transcatheter aortic valve replacement.  The patient has been independently evaluated by Dr. Cyndia Bent with CT surgery and they are felt to be at high risk for conventional surgical aortic valve replacement. The surgeon indicated the patient would be a poor candidate for conventional surgery. Based upon review of all of the patient's preoperative diagnostic tests they are felt to be candidate for transcatheter aortic valve replacement using the transfemoral approach as an alternative to high risk conventional surgery.    Following the decision to proceed with transcatheter aortic valve replacement, a discussion has been held regarding what types of management strategies would be attempted intraoperatively in the event of life-threatening complications, including whether or not the patient would be considered a candidate for the use of cardiopulmonary bypass and/or conversion to open sternotomy for attempted surgical intervention.  The patient has been advised of a variety of complications that might develop peculiar to this approach including but not limited to risks of death, stroke, paravalvular leak, aortic dissection or other major vascular complications, aortic annulus rupture, device embolization, cardiac rupture or perforation, acute myocardial infarction, arrhythmia, heart block or bradycardia requiring permanent pacemaker placement, congestive heart failure, respiratory failure, renal failure, pneumonia, infection, other late complications related to structural valve deterioration or migration, or other complications that might ultimately cause a temporary or permanent loss of functional independence or other long term morbidity.  The patient provides full informed consent for the procedure as described and all questions were answered preoperatively.    DETAILS OF THE OPERATIVE  PROCEDURE  PREPARATION:   The patient is brought to the operating room on the above mentioned date and central  monitoring was established by the anesthesia team including placement of a radial arterial line. The patient is placed in the supine position on the operating table.  Intravenous antibiotics are administered. General anesthesia is used.    Baseline transesophageal echocardiogram was performed. The patient's chest, abdomen, both groins, and both lower extremities are prepared and draped in a sterile manner. A time out procedure is performed.   PERIPHERAL ACCESS:   Using the modified Seldinger technique, femoral arterial access was obtained with placement of a 6 Fr sheath on the left side and femoral venous access obtained on the right side with placement of a 6 French sheath. U/S guidance was used. A pigtail diagnostic catheter was passed through the femoral arterial sheath under fluoroscopic guidance into the aortic root.  A temporary transvenous pacemaker catheter was passed through the femoral venous sheath under fluoroscopic guidance into the right ventricle.  The pacemaker was tested to ensure stable lead placement and pacemaker capture. Aortic root angiography was performed in order to determine the optimal angiographic angle for valve deployment.  TRANSFEMORAL ACCESS:  A micropuncture kit was used to gain access to the right femoral artery. Position confirmed with angiography. Pre-closure with double ProGlide closure devices. The patient was heparinized systemically and ACT verified > 250 seconds.    A 14 Fr transfemoral E-sheath was introduced into the right femoral artery after progressively dilating over an Amplatz superstiff wire. A JR4 catheter was used to direct a straight-tip exchange length wire across the native aortic valve into the left ventricle. This was exchanged out for a pigtail catheter and position was confirmed in the LV apex. Simultaneous LV and Ao pressures were  recorded.  The pigtail catheter was then exchanged for an Amplatz Extra-stiff wire in the LV apex.   TRANSCATHETER HEART VALVE DEPLOYMENT:  An Edwards Sapien 3 THV (size 23 mm) was prepared and crimped per manufacturer's guidelines, and the proper orientation of the valve is confirmed on the [employer] delivery system. The valve was advanced through the introducer sheath using normal technique until in an appropriate position in the abdominal aorta beyond the sheath tip. The balloon was then retracted and using the fine-tuning wheel was centered on the valve. The valve was then advanced across the aortic arch using appropriate flexion of the catheter. The valve was carefully positioned across the aortic valve annulus. The Commander catheter was retracted using normal technique. Once final position of the valve has been confirmed by angiographic assessment, the valve is deployed while temporarily holding ventilation and during rapid ventricular pacing to maintain systolic blood pressure < 50 mmHg and pulse pressure < 10 mmHg. The balloon inflation is held for >3 seconds after reaching full deployment volume. Once the balloon has fully deflated the balloon is retracted into the ascending aorta and valve function is assessed using TTE. There is felt to be trivial paravalvular leak and no central aortic insufficiency.  The patient's hemodynamic recovery following valve deployment is good.  The deployment balloon and guidewire are both removed. Echo demostrated acceptable post-procedural gradients, stable mitral valve function, and trivial AI.   PROCEDURE COMPLETION:  The sheath was then removed and closure devices were completed. Protamine was administered once femoral arterial repair was complete. The temporary pacemaker, pigtail catheters and femoral sheaths were removed with a Mynx closure device placed in the left femoral artery. The right femoral venous sheath was removed and manual pressure was used  for hemostasis.   The patient tolerated the procedure well and is transported  to the surgical intensive care in stable condition. There were no immediate intraoperative complications. All sponge instrument and needle counts are verified correct at completion of the operation.   No blood products were administered during the operation.  The patient received a total of 50.7 mL of intravenous contrast during the procedure.  Lauree Chandler MD 11/30/2019 2:56 PM

## 2019-11-30 NOTE — Progress Notes (Signed)
  [facility] VALVE TEAM  Patient doing well s/p TAVR. She is hemodynamically stable. Groin sites stable. ECG with afib and no high grade block. Arterial line to be discontinued and transfer to 4E. Patient has had ongoing issues with urinary retention. Can in and out cath as needed. Plan for bladder scan when she arrives to 4E. Plan for early ambulation after bedrest completed and hopeful discharge over the next 24-48 hours.   Angelena Form PA-C  [facility]  Pager [phone]

## 2019-12-01 ENCOUNTER — Encounter (HOSPITAL_COMMUNITY): Payer: Self-pay | Admitting: Cardiovascular Disease

## 2019-12-01 ENCOUNTER — Inpatient Hospital Stay (HOSPITAL_COMMUNITY): Payer: Medicare Other

## 2019-12-01 DIAGNOSIS — Z952 Presence of prosthetic heart valve: Secondary | ICD-10-CM

## 2019-12-01 DIAGNOSIS — I350 Nonrheumatic aortic (valve) stenosis: Principal | ICD-10-CM

## 2019-12-01 LAB — CBC
HCT: 35.6 % — ABNORMAL LOW (ref 36.0–46.0)
Hemoglobin: 11.4 g/dL — ABNORMAL LOW (ref 12.0–15.0)
MCH: 28.6 pg (ref 26.0–34.0)
MCHC: 32 g/dL (ref 30.0–36.0)
MCV: 89.2 fL (ref 80.0–100.0)
Platelets: 171 10*3/uL (ref 150–400)
RBC: 3.99 MIL/uL (ref 3.87–5.11)
RDW: 13.8 % (ref 11.5–15.5)
WBC: 6.2 10*3/uL (ref 4.0–10.5)
nRBC: 0 % (ref 0.0–0.2)

## 2019-12-01 LAB — BASIC METABOLIC PANEL
Anion gap: 8 (ref 5–15)
BUN: 14 mg/dL (ref 8–23)
CO2: 23 mmol/L (ref 22–32)
Calcium: 8 mg/dL — ABNORMAL LOW (ref 8.9–10.3)
Chloride: 108 mmol/L (ref 98–111)
Creatinine, Ser: 0.88 mg/dL (ref 0.44–1.00)
GFR calc Af Amer: >60 mL/min (ref >60)
GFR calc non Af Amer: >60 mL/min (ref >60)
Glucose, Bld: 122 mg/dL — ABNORMAL HIGH (ref 70–99)
Potassium: 3.8 mmol/L (ref 3.5–5.1)
Sodium: 139 mmol/L (ref 135–145)

## 2019-12-01 LAB — MAGNESIUM: Magnesium: 1.4 mg/dL — ABNORMAL LOW (ref 1.7–2.4)

## 2019-12-01 MED ORDER — METOPROLOL TARTRATE 50 MG PO TABS
50.0000 mg | ORAL_TABLET | Freq: Two times a day (BID) | ORAL | Status: DC
  Administered 2019-12-01: 50 mg via ORAL
  Filled 2019-12-01: qty 1

## 2019-12-01 MED ORDER — AMLODIPINE BESYLATE 5 MG PO TABS
5.0000 mg | ORAL_TABLET | Freq: Every day | ORAL | Status: DC
  Administered 2019-12-01: 5 mg via ORAL
  Filled 2019-12-01: qty 1

## 2019-12-01 MED ORDER — CHLORHEXIDINE GLUCONATE CLOTH 2 % EX PADS
6.0000 | MEDICATED_PAD | Freq: Every day | CUTANEOUS | Status: DC
  Administered 2019-12-01: 6 via TOPICAL

## 2019-12-01 MED ORDER — MUPIROCIN 2 % EX OINT
1.0000 "application " | TOPICAL_OINTMENT | Freq: Two times a day (BID) | CUTANEOUS | Status: DC
  Administered 2019-12-01: 1 via NASAL

## 2019-12-01 MED FILL — Magnesium Sulfate Inj 50%: INTRAMUSCULAR | Qty: 10 | Status: AC

## 2019-12-01 MED FILL — Heparin Sodium (Porcine) Inj 1000 Unit/ML: INTRAMUSCULAR | Qty: 30 | Status: AC

## 2019-12-01 MED FILL — Potassium Chloride Inj 2 mEq/ML: INTRAVENOUS | Qty: 40 | Status: AC

## 2019-12-01 NOTE — Progress Notes (Signed)
1300 Came to see pt to walk. Pt in wheelchair and ready for d/c.Marland Kitchen  Pt appears confused and not appropriate for ed or CRP 2 referral at this time. RN to educate with daughter who is picking pt up. Graylon Good RN BSN 12/01/2019 1:04 PM

## 2019-12-01 NOTE — Progress Notes (Signed)
Echocardiogram 2D Echocardiogram has been performed.  Oneal Deputy Ellisyn Icenhower 12/01/2019, 8:47 AM

## 2019-12-01 NOTE — Progress Notes (Signed)
D/C instructions given to Santiago Glad (daughter). Wound care and medications reviewed. All questions answered. IV removed, clean and intact. Daughter to escort pt home.  Clyde Canterbury, RN

## 2019-12-01 NOTE — Discharge Summary (Addendum)
[name]  Discharge Summary    Patient ID: [name] MRN: [id number]; DOB: 05/21/1933  Admit date: 11/30/2019 Discharge date: 12/01/2019  Primary Care Provider: [name], MD  Primary Cardiologist: Pixie Casino, MD / Dr. Angelena Form & Dr. Cyndia Bent (TAVR)   Discharge Diagnoses    Principal Problem:   S/P TAVR (transcatheter aortic valve replacement) Active Problems:   Hyperlipidemia   Essential hypertension   Sleep apnea   Persistent atrial fibrillation (HCC)   CAD (coronary artery disease)   Hypothyroidism   Dementia (HCC)   Severe aortic stenosis   Urinary retention   Allergies Allergies  Allergen Reactions   Fosamax [Alendronate Sodium] Other (See Comments)    Unknown per daughter    Diagnostic Studies/Procedures     TAVR OPERATIVE NOTE   Date of Procedure:                11/30/2019  Preoperative Diagnosis:      Severe Aortic Stenosis   Postoperative Diagnosis:    Same   Procedure:        Transcatheter Aortic Valve Replacement - Percutaneous Right Transfemoral Approach             Edwards Sapien 3 Ultra THV (size 23 mm, model # [id number], serial # [id number])              Co-Surgeons:                        Gaye Pollack, MD and Lauree Chandler, MD   Anesthesiologist:                  Roberts Gaudy, MD  Echocardiographer:              Ena Dawley, MD  Pre-operative Echo Findings: ? Severe aortic stenosis ? Normal left ventricular systolic function  Post-operative Echo Findings: ? Trivial paravalvular leak ? Normal left ventricular systolic function  _____________   Echo 12/01/19: completed but pending formal read at the time of discharge    History of Present Illness     [name] is a 85 y.o. female with a history of HTN, HLD, hypothyroidism, sleep apnea, dementia, persistent atrial fibrillation on warfarin, CAD s/p NSTEMI (OM treated with a BMS  01/2015), limited mobility and severe aortic stenosis who presented to [facility] on 11/30/19 for planned TAVR.   She is known to have CAD and had an obtuse marginal treated with a bare metal stent in February 2016. Mild disease in the LAD and RCA. She has persistent atrial fibrillation and has been on coumadin. She has failed previous cardioversion attempts. She has been followed for moderate aortic stenosis for several years which recently progressed to severe. Most recent echo 10/04/19 with LVEF=60-65%. The aortic valve leaflets are thickened with poor leaflet excursion, mean gradient 47 mmHg, peak gradient 64 mmHg, AVA 0.69 cm2, dimensionless index 0.18. Pre TAVR cath showed mild non obst CAD with a patent OM stent as well as severe AS with a mean gradient of 34.8 mm Hg.   The patient has been evaluated by the multidisciplinary valve team and felt to have severe, symptomatic aortic stenosis and to be a suitable candidate for TAVR, which was set up for 11/30/19.   Hospital Course     Consultants: none  Severe AS: s/p successful TAVR with a 23 mm Edwards S3U THV via the TF approach on 11/30/19. Post  operative echo completed but pending formal read. Groin sites are stable. ECG with afib and no high grade heart block. She has been on chronic coumadin and plavix, which will be continued at discharge. Plan for discharge home today with close follow up in the office next week.   Chronic atrial fibrillation: has had some elevated rates. Will plan to resume home Lopressor 50mg  BID now. Resume home coumadin tonight (on normal schedule). She has a coumadin clinic appointment scheduled for next Monday.   HTN: BP well controlled. Resume home meds at discharge.   Acute on chronic diastolic CHF: as evidenced by an elevated BNP on pre admission lab work and cardiomegaly on CXR. This has been treated with TAVR.   CAD: pre TAVR cath showed  mild non obst CAD with a patent OM stent. Continue medical therapy with plavix,  statin and bb.   _____________  Discharge Vitals Blood pressure 140/81, pulse (!) 133, temperature 97.9 F (36.6 C), temperature source Oral, resp. rate 14, height 5\' 2"  (1.575 m), weight 79 kg, SpO2 98 %.  Filed Weights   11/30/19 0900 11/30/19 1815 12/01/19 0232  Weight: 78.6 kg 82 kg 79 kg    Labs & Radiologic Studies    CBC Recent Labs    11/30/19 1538 12/01/19 0302  WBC  --  6.2  HGB 11.6* 11.4*  HCT 34.0* 35.6*  MCV  --  89.2  PLT  --  [id number]   Basic Metabolic Panel Recent Labs    11/30/19 1538 12/01/19 0302  NA 141 139  K 4.0 3.8  CL 107 108  CO2  --  23  GLUCOSE 130* 122*  BUN 15 14  CREATININE 0.70 0.88  CALCIUM  --  8.0*  MG  --  1.4*   Liver Function Tests No results for input(s): AST, ALT, ALKPHOS, BILITOT, PROT, ALBUMIN in the last 72 hours. No results for input(s): LIPASE, AMYLASE in the last 72 hours. Cardiac Enzymes No results for input(s): CKTOTAL, CKMB, CKMBINDEX, TROPONINI in the last 72 hours. BNP Invalid input(s): POCBNP D-Dimer No results for input(s): DDIMER in the last 72 hours. Hemoglobin A1C No results for input(s): HGBA1C in the last 72 hours. Fasting Lipid Panel No results for input(s): CHOL, HDL, LDLCALC, TRIG, CHOLHDL, LDLDIRECT in the last 72 hours. Thyroid Function Tests No results for input(s): TSH, T4TOTAL, T3FREE, THYROIDAB in the last 72 hours.  Invalid input(s): FREET3 _____________  Dg Chest 2 View  Result Date: 11/26/2019 CLINICAL DATA:  Preoperative evaluation for open heart surgery, coronary artery disease post MI, hypertension, atrial fibrillation, dementia EXAM: CHEST - 2 VIEW COMPARISON:  07/10/2019 FINDINGS: Enlargement of cardiac silhouette. Mediastinal contours and pulmonary vascularity normal. Mild bronchitic changes. No acute infiltrate, pleural effusion, or pneumothorax. Bones demineralized. IMPRESSION: Enlargement of cardiac silhouette. Mild chronic bronchitic changes without infiltrate. Electronically Signed    By: Lavonia Dana M.D.   On: 11/26/2019 12:22   Ct Coronary Morph W/cta Cor W/score W/ca W/cm &/or Wo/cm  Addendum Date: 11/16/2019   ADDENDUM REPORT: 11/16/2019 19:52 CLINICAL DATA:  85 year old female with severe aortic stenosis being evaluated for a TAVR procedure. EXAM: Cardiac TAVR CT TECHNIQUE: The patient was scanned on a [employer]. A 120 kV retrospective scan was triggered in the descending thoracic aorta at 111 HU's. Gantry rotation speed was 250 msecs and collimation was .6 mm. No beta blockade or nitro were given. The 3D data set was reconstructed in 5% intervals of the R-R cycle. Systolic and diastolic  phases were analyzed on a dedicated work station using MPR, MIP and VRT modes. The patient received 80 cc of contrast. FINDINGS: Aortic Valve: Trileaflet aortic valve with moderately thickened and calcified leaflets and no calcifications extending into the LVOT. Aorta: Normal size with mild diffuse atherosclerotic plaque and no dissection. Sinotubular Junction: 27 x 26 mm Ascending Thoracic Aorta: 32 x 31 mm Aortic Arch: 25 x 24 mm Descending Thoracic Aorta: 22 x 22 mm Sinus of Valsalva Measurements: Non-coronary: 30 mm Right -coronary: 30 mm Left -coronary: 32 mm Coronary Artery Height above Annulus: Left Main: 11 mm Right Coronary: 17 mm Virtual Basal Annulus Measurements: Maximum/Minimum Diameter: 26.4 x 20.7 mm Mean Diameter: 23.4 mm Perimeter: 75.5 mm Area: 432 mm2 Optimum Fluoroscopic Angle for Delivery: LAO 35 CAU 23 IMPRESSION: 1. Trileaflet aortic valve with moderately thickened and calcified leaflets and no calcifications extending into the LVOT. Aortic valve calcium score 2044 consistent with severe aortic stenosis. Annular measurements are borderline between 23 and 26 mm Edwards-SAPIEN 3 Ultra valve. 2. Sufficient coronary to annulus distance. 3. Optimum Fluoroscopic Angle for Delivery: LAO 35 CAU 23 4. No thrombus in the left atrial appendage. 5. Dilated pulmonary artery  measuring 34 mm. Electronically Signed   By: Ena Dawley   On: 11/16/2019 19:52   Result Date: 11/16/2019 EXAM: OVER-READ INTERPRETATION  CT CHEST The following report is an over-read performed by radiologist Dr. Salvatore Marvel of [facility], [place] on 11/16/2019. This over-read does not include interpretation of cardiac or coronary anatomy or pathology. The CTA interpretation by the cardiologist is attached. COMPARISON:  02/17/2015 chest CT angiogram. FINDINGS: Please see the separate concurrent chest CT angiogram report for details. IMPRESSION: Please see the separate concurrent chest CT angiogram report for details. Electronically Signed: By: Ilona Sorrel M.D. On: 11/16/2019 16:23   Dg Chest Port 1 View  Result Date: 11/30/2019 CLINICAL DATA:  85 year old female status post aortic valve replacement. EXAM: PORTABLE CHEST 1 VIEW COMPARISON:  Chest radiograph dated 11/26/2019. FINDINGS: Status post aortic valve replacement. Minimal left lung base densities may represent atelectasis. Developing infiltrate is less likely but not excluded. Clinical correlation is recommended. No large pleural effusion or pneumothorax. Stable cardiomediastinal silhouette. No acute osseous pathology. IMPRESSION: 1. Status post aortic valve replacement. No pneumothorax. 2. Minimal left lung base atelectasis. Developing infiltrate is less likely but not excluded. Electronically Signed   By: Anner Crete M.D.   On: 11/30/2019 18:43   Ct Angio Chest Aorta W &/or Wo Contrast  Result Date: 11/16/2019 CLINICAL DATA:  Severe symptomatic aortic stenosis. Pre-TAVR evaluation. EXAM: CT ANGIOGRAPHY CHEST CTA ABDOMEN AND PELVIS WITH CONTRAST TECHNIQUE: Multidetector CT imaging through the chest was performed using the standard protocol during bolus administration of intravenous contrast. Multiplanar reconstructed images and MIPs were obtained and reviewed to evaluate the vascular anatomy. Multidetector CT imaging of the abdomen  and pelvis was performed using the standard protocol before and during bolus administration of intravenous contrast. Multiplanar reconstructed images and MIPs were obtained and reviewed to evaluate the vascular anatomy. CONTRAST:  193mL OMNIPAQUE IOHEXOL 350 MG/ML SOLN COMPARISON:  07/10/2019 CT abdomen/pelvis. 02/17/2015 chest CT angiogram. FINDINGS: CTA CHEST FINDINGS Cardiovascular: Moderate cardiomegaly. No significant pericardial effusion/thickening. Diffuse thickening and calcification of the aortic valve. Three-vessel coronary atherosclerosis. Atherosclerotic nonaneurysmal thoracic aorta. Top-normal caliber main pulmonary artery (3.3 cm diameter). No central pulmonary emboli. Mediastinum/Nodes: Dominant calcified 1.2 cm left thyroid nodule. Unremarkable esophagus. No axillary adenopathy. Mildly enlarged 1.3 cm subcarinal node (series 15/image 209), stable since  02/17/2015 chest CT. No new pathologically enlarged mediastinal nodes. No hilar adenopathy. Lungs/Pleura: No pneumothorax. No pleural effusion. Mosaic attenuation throughout both lungs. Diffuse bronchial wall thickening. No acute consolidative airspace disease, lung masses or significant pulmonary nodules. Musculoskeletal: No aggressive appearing focal osseous lesions. Moderate thoracic spondylosis. Intervertebral disc ankylosis in the lower thoracic spine. CTA ABDOMEN AND PELVIS FINDINGS Hepatobiliary: Normal liver with no liver mass. Cholelithiasis. No biliary ductal dilatation. Pancreas: Normal, with no mass or duct dilation. Spleen: Normal size. No mass. Adrenals/Urinary Tract: Normal adrenals. No hydronephrosis. Parapelvic renal cysts in both kidneys. Exophytic simple 1.0 cm lower left renal cortical cyst. Additional scattered subcentimeter hypodense renal cortical lesions in both kidneys are too small to characterize and require no follow-up. Normal bladder. Stomach/Bowel: Normal non-distended stomach. Normal caliber small bowel with no small  bowel wall thickening. Normal appendix. Mild sigmoid diverticulosis, with no large bowel wall thickening or significant pericolonic fat stranding. Vascular/Lymphatic: Atherosclerotic nonaneurysmal abdominal aorta. Patent portal, splenic and renal veins. No pathologically enlarged lymph nodes in the abdomen or pelvis. Reproductive: Normal uterus. No discrete adnexal mass. Mild asymmetric soft tissue prominence in the right adnexa is unchanged since 2005 CT, considered benign. Other: No pneumoperitoneum, ascites or focal fluid collection. Musculoskeletal: No aggressive appearing focal osseous lesions. Marked lumbar spondylosis. VASCULAR MEASUREMENTS PERTINENT TO TAVR: AORTA: Minimal Aortic Diameter-14.3 x 14.2 mm Severity of Aortic Calcification-moderate RIGHT PELVIS: Right Common Iliac Artery - Minimal Diameter-7.3 x 6.3 mm Tortuosity-mild Calcification-moderate to severe Right External Iliac Artery - Minimal Diameter-7.5 x 7.4 mm Tortuosity-moderate to severe Calcification-none Right Common Femoral Artery - Minimal Diameter-7.0 x 5.4 mm Tortuosity-mild Calcification-mild LEFT PELVIS: Left Common Iliac Artery - Minimal Diameter-9.1 x 7.9 mm Tortuosity-moderate Calcification-moderate Left External Iliac Artery - Minimal Diameter-8.6 x 8.3 mm Tortuosity-moderate Calcification-none Left Common Femoral Artery - Minimal Diameter-8.3 x 7.9 mm Tortuosity-mild Calcification-mild Review of the MIP images confirms the above findings. IMPRESSION: 1. Vascular findings and measurements pertinent to potential TAVR procedure, as detailed. 2. Diffuse thickening and calcification of the aortic valve, compatible with a reported history of severe symptomatic aortic stenosis. 3. Moderate cardiomegaly.  Three-vessel coronary atherosclerosis. 4. Mosaic attenuation throughout both lungs, nonspecific, which could be due to air trapping from small airways disease or mosaic perfusion from pulmonary vascular disease. 5. Cholelithiasis. 6. Mild  sigmoid diverticulosis. 7. Aortic Atherosclerosis (ICD10-I70.0). Electronically Signed   By: Ilona Sorrel M.D.   On: 11/16/2019 13:59   Vas US Carotid  Result Date: 11/16/2019 Carotid Arterial Duplex Study Indications:       Pre-surgical evaluation. Risk Factors:      Hypertension, hyperlipidemia, coronary artery disease. Other Factors:     Aortic stenosis. Comparison Study:  No prior study Performing Technologist: Maudry Mayhew MHA, RDMS, RVT, RDCS  Examination Guidelines: A complete evaluation includes B-mode imaging, spectral Doppler, color Doppler, and power Doppler as needed of all accessible portions of each vessel. Bilateral testing is considered an integral part of a complete examination. Limited examinations for reoccurring indications may be performed as noted.  Right Carotid Findings: +----------+--------+--------+--------+--------------------------+--------+             PSV cm/s EDV cm/s Stenosis Plaque Description         Comments  +----------+--------+--------+--------+--------------------------+--------+  CCA Prox   51       8                                                      +----------+--------+--------+--------+--------------------------+--------+  CCA Distal 46       10                                                     +----------+--------+--------+--------+--------------------------+--------+  ICA Prox   23       5                                                      +----------+--------+--------+--------+--------------------------+--------+  ICA Distal 37       9                                                      +----------+--------+--------+--------+--------------------------+--------+  ECA        42                         heterogenous and irregular           +----------+--------+--------+--------+--------------------------+--------+ +----------+--------+-------+----------------+-------------------+             PSV cm/s EDV cms Describe         Arm Pressure (mmHG)   +----------+--------+-------+----------------+-------------------+  Subclavian 93               Multiphasic, WNL                      +----------+--------+-------+----------------+-------------------+ +---------+--------+--+--------+-+---------+  Vertebral PSV cm/s 40 EDV cm/s 9 Antegrade  +---------+--------+--+--------+-+---------+  Left Carotid Findings: +----------+--------+--------+--------+------------------+--------+             PSV cm/s EDV cm/s Stenosis Plaque Description Comments  +----------+--------+--------+--------+------------------+--------+  CCA Prox   54       5                                              +----------+--------+--------+--------+------------------+--------+  CCA Distal 47       9                                              +----------+--------+--------+--------+------------------+--------+  ICA Prox   26       7                                              +----------+--------+--------+--------+------------------+--------+  ICA Distal 38       10                                             +----------+--------+--------+--------+------------------+--------+  ECA        36                                                      +----------+--------+--------+--------+------------------+--------+ +----------+--------+--------+----------------+-------------------+  PSV cm/s EDV cm/s Describe         Arm Pressure (mmHG)  +----------+--------+--------+----------------+-------------------+  Subclavian 55                Multiphasic, WNL                      +----------+--------+--------+----------------+-------------------+ +---------+--------+--+--------+-+---------+  Vertebral PSV cm/s 36 EDV cm/s 8 Antegrade  +---------+--------+--+--------+-+---------+  Summary: Right Carotid: Velocities in the right ICA are consistent with a 1-39% stenosis. Left Carotid: Velocities in the left ICA are consistent with a 1-39% stenosis. Vertebrals:  Bilateral vertebral arteries demonstrate  antegrade flow. Subclavians: Normal flow hemodynamics were seen in bilateral subclavian              arteries. *See table(s) above for measurements and observations.  Electronically signed by Deitra Mayo MD on 11/16/2019 at 1:05:18 PM.    Final    Ct Angio Abd/pel W/ And/or W/o  Result Date: 11/16/2019 CLINICAL DATA:  Severe symptomatic aortic stenosis. Pre-TAVR evaluation. EXAM: CT ANGIOGRAPHY CHEST CTA ABDOMEN AND PELVIS WITH CONTRAST TECHNIQUE: Multidetector CT imaging through the chest was performed using the standard protocol during bolus administration of intravenous contrast. Multiplanar reconstructed images and MIPs were obtained and reviewed to evaluate the vascular anatomy. Multidetector CT imaging of the abdomen and pelvis was performed using the standard protocol before and during bolus administration of intravenous contrast. Multiplanar reconstructed images and MIPs were obtained and reviewed to evaluate the vascular anatomy. CONTRAST:  113mL OMNIPAQUE IOHEXOL 350 MG/ML SOLN COMPARISON:  07/10/2019 CT abdomen/pelvis. 02/17/2015 chest CT angiogram. FINDINGS: CTA CHEST FINDINGS Cardiovascular: Moderate cardiomegaly. No significant pericardial effusion/thickening. Diffuse thickening and calcification of the aortic valve. Three-vessel coronary atherosclerosis. Atherosclerotic nonaneurysmal thoracic aorta. Top-normal caliber main pulmonary artery (3.3 cm diameter). No central pulmonary emboli. Mediastinum/Nodes: Dominant calcified 1.2 cm left thyroid nodule. Unremarkable esophagus. No axillary adenopathy. Mildly enlarged 1.3 cm subcarinal node (series 15/image 209), stable since 02/17/2015 chest CT. No new pathologically enlarged mediastinal nodes. No hilar adenopathy. Lungs/Pleura: No pneumothorax. No pleural effusion. Mosaic attenuation throughout both lungs. Diffuse bronchial wall thickening. No acute consolidative airspace disease, lung masses or significant pulmonary nodules.  Musculoskeletal: No aggressive appearing focal osseous lesions. Moderate thoracic spondylosis. Intervertebral disc ankylosis in the lower thoracic spine. CTA ABDOMEN AND PELVIS FINDINGS Hepatobiliary: Normal liver with no liver mass. Cholelithiasis. No biliary ductal dilatation. Pancreas: Normal, with no mass or duct dilation. Spleen: Normal size. No mass. Adrenals/Urinary Tract: Normal adrenals. No hydronephrosis. Parapelvic renal cysts in both kidneys. Exophytic simple 1.0 cm lower left renal cortical cyst. Additional scattered subcentimeter hypodense renal cortical lesions in both kidneys are too small to characterize and require no follow-up. Normal bladder. Stomach/Bowel: Normal non-distended stomach. Normal caliber small bowel with no small bowel wall thickening. Normal appendix. Mild sigmoid diverticulosis, with no large bowel wall thickening or significant pericolonic fat stranding. Vascular/Lymphatic: Atherosclerotic nonaneurysmal abdominal aorta. Patent portal, splenic and renal veins. No pathologically enlarged lymph nodes in the abdomen or pelvis. Reproductive: Normal uterus. No discrete adnexal mass. Mild asymmetric soft tissue prominence in the right adnexa is unchanged since 2005 CT, considered benign. Other: No pneumoperitoneum, ascites or focal fluid collection. Musculoskeletal: No aggressive appearing focal osseous lesions. Marked lumbar spondylosis. VASCULAR MEASUREMENTS PERTINENT TO TAVR: AORTA: Minimal Aortic Diameter-14.3 x 14.2 mm Severity of Aortic Calcification-moderate RIGHT PELVIS: Right Common Iliac Artery - Minimal Diameter-7.3 x 6.3 mm Tortuosity-mild Calcification-moderate to severe Right External Iliac Artery - Minimal Diameter-7.5 x 7.4 mm Tortuosity-moderate to  severe Calcification-none Right Common Femoral Artery - Minimal Diameter-7.0 x 5.4 mm Tortuosity-mild Calcification-mild LEFT PELVIS: Left Common Iliac Artery - Minimal Diameter-9.1 x 7.9 mm Tortuosity-moderate  Calcification-moderate Left External Iliac Artery - Minimal Diameter-8.6 x 8.3 mm Tortuosity-moderate Calcification-none Left Common Femoral Artery - Minimal Diameter-8.3 x 7.9 mm Tortuosity-mild Calcification-mild Review of the MIP images confirms the above findings. IMPRESSION: 1. Vascular findings and measurements pertinent to potential TAVR procedure, as detailed. 2. Diffuse thickening and calcification of the aortic valve, compatible with a reported history of severe symptomatic aortic stenosis. 3. Moderate cardiomegaly.  Three-vessel coronary atherosclerosis. 4. Mosaic attenuation throughout both lungs, nonspecific, which could be due to air trapping from small airways disease or mosaic perfusion from pulmonary vascular disease. 5. Cholelithiasis. 6. Mild sigmoid diverticulosis. 7. Aortic Atherosclerosis (ICD10-I70.0). Electronically Signed   By: Ilona Sorrel M.D.   On: 11/16/2019 13:59   Disposition   Pt is being discharged home today in good condition.  Follow-up Plans & Appointments    Follow-up Information    Eileen Stanford, PA-C. Go on 12/08/2019.   Specialties: Cardiology, Radiology Why: @ 4pm, please arrive at least 10 minutes early Contact information: [place] [id number] [phone]        [facility]. Go on 12/06/2019.   Why: @ 11:30am for your coumadin clinic appointment.  Contact information: [place] [phone]           Discharge Medications   Allergies as of 12/01/2019      Reactions   Fosamax [alendronate Sodium] Other (See Comments)   Unknown per daughter      Medication List    TAKE these medications   ALPRAZolam 0.25 MG tablet Commonly known as: XANAX TAKE 1 TABLET BY MOUTH EVERY DAY AT BEDTIME AS NEEDED FOR SLEEP What changed: See the new instructions.   amLODipine 5 MG tablet Commonly known as:  NORVASC TAKE 1 TABLET (5 MG TOTAL) DAILY BY MOUTH.   atorvastatin 80 MG tablet Commonly known as: LIPITOR TAKE 1 TABLET BY MOUTH DAILY X 6PM What changed: See the new instructions.   cholecalciferol 1000 units tablet Commonly known as: VITAMIN D Take 1,000 Units by mouth daily.   CITRACAL PO Take 1 tablet by mouth 2 (two) times daily.   clopidogrel 75 MG tablet Commonly known as: PLAVIX TAKE 1 TABLET BY MOUTH EVERY DAY   cyanocobalamin 1000 MCG tablet Take 1,000 mcg by mouth daily.   levothyroxine 137 MCG tablet Commonly known as: SYNTHROID TAKE 1 TABLET BY MOUTH EVERY DAY What changed: when to take this   lisinopril 5 MG tablet Commonly known as: ZESTRIL Take 1 tablet (5 mg total) by mouth daily.   metoprolol tartrate 50 MG tablet Commonly known as: LOPRESSOR TAKE 1 TABLET BY MOUTH TWICE A DAY   pantoprazole 40 MG tablet Commonly known as: PROTONIX TAKE 1 TABLET BY MOUTH EVERY DAY   traMADol 50 MG tablet Commonly known as: ULTRAM Take 1 tablet (50 mg total) by mouth every 8 (eight) hours as needed.   warfarin 1 MG tablet Commonly known as: COUMADIN Take as directed. If you are unsure how to take this medication, talk to your nurse or doctor. Original instructions: TAKE 2-3 TABLETS BY MOUTH DAILY AS DIRECTED BY COUMADIN CLINIC What changed: See the new instructions.          Outstanding Labs/Studies   None   Duration of Discharge  Encounter   Greater than 30 minutes including physician time.  Signed, Angelena Form, PA-C 12/01/2019, 11:39 AM [phone]  I have personally seen and examined this patient. I agree with the assessment and plan as outlined above.  She is doing well post TAVR. Bilateral groins without hematoma. Labs reviewed by me. EKG reviewed by me. Atrial fib. The bioprosthetic AVR is working well by echo today. BP stable. Will resume coumadin and Plavix.  Discharge home today.   Lauree Chandler 12/01/2019 11:53 AM

## 2019-12-02 ENCOUNTER — Telehealth: Payer: Self-pay | Admitting: Physician Assistant

## 2019-12-02 LAB — ECHOCARDIOGRAM COMPLETE
Height: 62 in
Weight: 2787.2 oz

## 2019-12-02 NOTE — Telephone Encounter (Signed)
  [facility]   MULTIDISCIPLINARY HEART VALVE TEAM   Attempted TOC call. No answer. Will try calling back tomorrow.   Angelena Form PA-C  [facility]

## 2019-12-03 NOTE — Telephone Encounter (Signed)
  [facility] VALVE TEAM   Patient contacted regarding discharge from [facility] on 12/01/19  Patient understands to follow up with provider Nell Range on 12/9 at [facility].  Patient understands discharge instructions? yes Patient understands medications and regimen? yes Patient understands to bring all medications to this visit? Yes   I spoke to Gelene Mink, daughter, who says that she is doing okay but generally not feeling very well. She has been in bed mostly and has no appetite. She did improve a little when her son came over. No chest pain or shortness of breath. No fever or chills. Groin sites are healing well. She has not been able to check her BP or HR. I have asked her to make sure she is staying hydrated and asked her to hold lisinopril if not eating or drinking well. She will closely monitor her and call back if she worsens.   Angelena Form PA-C  [facility]

## 2019-12-06 ENCOUNTER — Other Ambulatory Visit: Payer: Self-pay

## 2019-12-06 ENCOUNTER — Ambulatory Visit: Payer: Medicare Other | Admitting: *Deleted

## 2019-12-06 DIAGNOSIS — I4819 Other persistent atrial fibrillation: Secondary | ICD-10-CM | POA: Diagnosis not present

## 2019-12-06 DIAGNOSIS — Z5181 Encounter for therapeutic drug level monitoring: Secondary | ICD-10-CM

## 2019-12-06 LAB — POCT INR: INR: 1.6 — AB (ref 2.0–3.0)

## 2019-12-06 NOTE — Patient Instructions (Signed)
Description   Take 3 tablets today and 1.5 tablets tomorrow,  then continue taking 2 tablets daily except for 1 tablet on tuesdays.  Please call [phone], for any changes in medications or up coming procedures.

## 2019-12-07 NOTE — Progress Notes (Addendum)
[facility] [place]                                       Cardiology Office Note    Date:  12/08/2019   ID:  [name], DOB 11/25/1933, MRN [id number]  PCP:  Carolyn Frizzle, MD  Cardiologist: Pixie Casino, MD / Dr. Angelena Form & Dr. Cyndia Bent (TAVR)   CC: [facility] s/p TAVR  History of Present Illness:  [name] is a 85 y.o. female with a history of HTN, HLD, hypothyroidism, sleep apnea, dementia, persistent atrial fibrillation on warfarin, CAD s/p NSTEMI (OM treated with a BMS 01/2015), limited mobility and severe aortic stenosis s/p TAVR (11/30/19) who presents to clinic for follow up.   She is known to have CAD and had an obtuse marginal treated with a bare metal stent in February 2016. Mild disease in the LAD and RCA. She has persistent atrial fibrillation and has been on coumadin. She has failed previous cardioversion attempts. She has been followed for moderate aortic stenosis for several years which recently progressed to severe. Most recent echo 10/04/19 with LVEF=60-65%. The aortic valve leaflets are thickened with poor leaflet excursion, mean gradient 47 mmHg,peak gradient 21mmHg, AVA 0.69 cm2, dimensionless index 0.18. Pre TAVR cath showed mild non obst CAD with a patent OM stent as well as severe AS with a mean gradient of 34.8 mm Hg.   She was evaluated by the multidisciplinary valve team and underwent successful TAVR with a 23 mm Edwards S3U THV via the TF approach on 11/30/19. Post op echo showed EF 65%, normally functioning TAVR with mean gradient of 15 mm Hg and trivial PVL. She was continued on her chronic coumadin and plavix.  Today she presents to clinic for follow up. Here with daughter. No CP. Still has dyspnea that is unchagned after TAVR. No LE edema, orthopnea or PND. No dizziness or syncope. No blood in stool or urine. No palpitations. She has not been able to sleep at all at night and then is profoundly fatigued  during the day.    Past Medical History:  Diagnosis Date  . Atrial fibrillation, persistent (HCC)    Atrial fibrillation  . CAD (coronary artery disease)    a. 01/2015 NSTEMI/PCI: LM nl, LAD 30p, LCX nondom, 30-40p, 50d, OM1 100p (2.75x16 Rebel BMS), RCA mild diff plaque, EF 55%.  . Cancer ([place])   . Candida infection, disseminated ([place])   . Dementia ([place])   . Hyperlipidemia   . Hypertension   . Hypothyroid   . Osteoporosis   . S/P TAVR (transcatheter aortic valve replacement)    s/p TAVR with 40mm Edwards S3U THV via the TF approach  . Severe aortic stenosis   . Sleep apnea 04/2012   Mild/ AHI 10/CPAP 7cm h2o w/2 L o2    Past Surgical History:  Procedure Laterality Date  . CARDIAC CATHETERIZATION  11/10/2019   RIGHT/LEFT HEART CATH AND CORONARY ANGIOGRAPHY   . CARDIOVERSION N/A 08/22/2015   Procedure: CARDIOVERSION;  Surgeon: Skeet Latch, MD;  Location: [place];  Service: Cardiovascular;  Laterality: N/A;  . JOINT REPLACEMENT Left    knee  . KNEE SURGERY    . LEFT HEART CATHETERIZATION WITH CORONARY ANGIOGRAM N/A 02/20/2015   Procedure: LEFT HEART CATHETERIZATION WITH CORONARY ANGIOGRAM;  Surgeon: Jolaine Artist, MD;  Location: [facility] CATH LAB;  Service: Cardiovascular;  Laterality: N/A;  . PERCUTANEOUS CORONARY STENT INTERVENTION (PCI-S)  02/20/2015   Procedure: PERCUTANEOUS CORONARY STENT INTERVENTION (PCI-S);  Surgeon: Jolaine Artist, MD;  Location: [facility] CATH LAB;  Service: Cardiovascular;;  OM1  (2.75/36mm Rebel)  . RIGHT/LEFT HEART CATH AND CORONARY ANGIOGRAPHY N/A 11/10/2019   Procedure: RIGHT/LEFT HEART CATH AND CORONARY ANGIOGRAPHY;  Surgeon: Burnell Blanks, MD;  Location: [place] CV LAB;  Service: Cardiovascular;  Laterality: N/A;  . TEE WITHOUT CARDIOVERSION N/A 11/30/2019   Procedure: TRANSESOPHAGEAL ECHOCARDIOGRAM (TEE);  Surgeon: Burnell Blanks, MD;  Location: [place];  Service: Open Heart Surgery;  Laterality: N/A;  . TONSILLECTOMY     . TRANSCATHETER AORTIC VALVE REPLACEMENT, TRANSFEMORAL N/A 11/30/2019   Procedure: TRANSCATHETER AORTIC VALVE REPLACEMENT, TRANSFEMORAL;  Surgeon: Burnell Blanks, MD;  Location: [place];  Service: Open Heart Surgery;  Laterality: N/A;    Current Medications: Outpatient Medications Prior to Visit  Medication Sig Dispense Refill  . ALPRAZolam (XANAX) 0.25 MG tablet Take 0.25 mg by mouth at bedtime.    Marland Kitchen amLODipine (NORVASC) 5 MG tablet TAKE 1 TABLET (5 MG TOTAL) DAILY BY MOUTH. 90 tablet 1  . atorvastatin (LIPITOR) 80 MG tablet Take 80 mg by mouth daily.    . Calcium Citrate (CITRACAL PO) Take 1 tablet by mouth 2 (two) times daily.    . cholecalciferol (VITAMIN D) 1000 UNITS tablet Take 1,000 Units by mouth daily.    . clopidogrel (PLAVIX) 75 MG tablet Take 75 mg by mouth daily.    . cyanocobalamin 1000 MCG tablet Take 1,000 mcg by mouth daily.     Marland Kitchen levothyroxine (SYNTHROID) 137 MCG tablet Take 137 mcg by mouth daily before breakfast.    . lisinopril (ZESTRIL) 5 MG tablet Take 1 tablet (5 mg total) by mouth daily. 30 tablet 9  . metoprolol tartrate (LOPRESSOR) 50 MG tablet Take 50 mg by mouth 2 (two) times daily.    . pantoprazole (PROTONIX) 40 MG tablet Take 40 mg by mouth daily.    . traMADol (ULTRAM) 50 MG tablet Take 1 tablet (50 mg total) by mouth every 8 (eight) hours as needed. 30 tablet 0  . warfarin (COUMADIN) 1 MG tablet TAKE 2-3 TABLETS BY MOUTH DAILY AS DIRECTED BY COUMADIN CLINIC (Patient taking differently: Take 1-2 mg by mouth See admin instructions. Take 2 mg by mouth everyday except Tuesday at 1800, on Tuesday take 1 mg at 1800) 240 tablet 1  . ALPRAZolam (XANAX) 0.25 MG tablet TAKE 1 TABLET BY MOUTH EVERY DAY AT BEDTIME AS NEEDED FOR SLEEP (Patient taking differently: Take 0.25 mg by mouth at bedtime. ) 30 tablet 1  . atorvastatin (LIPITOR) 80 MG tablet TAKE 1 TABLET BY MOUTH DAILY X 6PM (Patient taking differently: Take 80 mg by mouth daily at 6 PM. ) 90 tablet 2  .  clopidogrel (PLAVIX) 75 MG tablet TAKE 1 TABLET BY MOUTH EVERY DAY (Patient taking differently: Take 75 mg by mouth daily. ) 90 tablet 2  . levothyroxine (SYNTHROID) 137 MCG tablet TAKE 1 TABLET BY MOUTH EVERY DAY (Patient taking differently: Take 137 mcg by mouth daily before breakfast. ) 90 tablet 3  . metoprolol tartrate (LOPRESSOR) 50 MG tablet TAKE 1 TABLET BY MOUTH TWICE A DAY (Patient taking differently: Take 50 mg by mouth 2 (two) times daily. ) 180 tablet 1  . pantoprazole (PROTONIX) 40 MG tablet TAKE 1 TABLET BY MOUTH EVERY DAY (Patient taking differently: Take 40 mg by mouth daily. ) 90  tablet 2   Facility-Administered Medications Prior to Visit  Medication Dose Route Frequency Provider Last Rate Last Dose  . denosumab (PROLIA) injection 60 mg  60 mg Subcutaneous Q6 months Carolyn Frizzle, MD   60 mg at 11/29/19 [id number]     Allergies:   Fosamax [alendronate sodium]   Social History   Socioeconomic History  . Marital status: Married    Spouse name: Not on file  . Number of children: Not on file  . Years of education: Not on file  . Highest education level: Not on file  Occupational History  . Not on file  Social Needs  . Financial resource strain: Not on file  . Food insecurity    Worry: Not on file    Inability: Not on file  . Transportation needs    Medical: Not on file    Non-medical: Not on file  Tobacco Use  . Smoking status: Former Smoker    Quit date: 04/28/1995    Years since quitting: 24.6  . Smokeless tobacco: Never Used  Substance and Sexual Activity  . Alcohol use: No    Alcohol/week: 0.0 standard drinks  . Drug use: No  . Sexual activity: Not on file  Lifestyle  . Physical activity    Days per week: Not on file    Minutes per session: Not on file  . Stress: Not on file  Relationships  . Social Herbalist on phone: Not on file    Gets together: Not on file    Attends religious service: Not on file    Active member of club or  organization: Not on file    Attends meetings of clubs or organizations: Not on file    Relationship status: Not on file  Other Topics Concern  . Not on file  Social History Narrative  . Not on file     Family History:  The patient's family history includes Arthritis in her mother; Coronary artery disease in her father; Diabetes in her father; Stroke in her mother.     ROS:   Please see the history of present illness.    ROS All other systems reviewed and are negative.   PHYSICAL EXAM:   VS:  BP 108/68   Pulse 76   Ht 5\' 2"  (1.575 m)   Wt 179 lb (81.2 kg)   SpO2 95%   BMI 32.74 kg/m    GEN: Well nourished, well developed, in no acute distress, elderly and frail appearing, obese HEENT: normal Neck: no JVD or masses Cardiac: RRR; soft flow murmur. No, rubs, or gallops,no edema  Respiratory:  clear to auscultation bilaterally, normal work of breathing GI: soft, nontender, nondistended, + BS MS: no deformity or atrophy Skin: warm and dry, no rash.  Groin sites clear without hematoma but extensive ecchymosis down left leg. Neuro:  Alert and Oriented x 3, Strength and sensation are intact Psych: euthymic mood, full affect   Wt Readings from Last 3 Encounters:  12/08/19 179 lb (81.2 kg)  12/01/19 174 lb 3.2 oz (79 kg)  11/26/19 173 lb 4.8 oz (78.6 kg)      Studies/Labs Reviewed:   EKG:  EKG is ordered today.  The ekg ordered today demonstrates afib with LBBB (QRS 162ms) HR 86 bpm.  Recent Labs: 11/26/2019: ALT 14; B Natriuretic Peptide 286.4 12/01/2019: BUN 14; Creatinine, Ser 0.88; Hemoglobin 11.4; Magnesium 1.4; Platelets 171; Potassium 3.8; Sodium 139   Lipid Panel    Component Value  Date/Time   CHOL 116 09/30/2017 0826   TRIG 134 09/30/2017 0826   HDL 41 (L) 09/30/2017 0826   CHOLHDL 2.8 09/30/2017 0826   VLDL 18 09/19/2016 1036   LDLCALC 53 09/30/2017 0826    Additional studies/ records that were reviewed today include:  TAVR OPERATIVE NOTE   Date of  Procedure:11/30/2019  Preoperative Diagnosis:Severe Aortic Stenosis   Postoperative Diagnosis:Same   Procedure:   Transcatheter Aortic Valve Replacement - PercutaneousRightTransfemoral Approach Edwards Sapien 3 Ultra THV (size 32mm, model # 9750TFX, serial # [id number])  Co-Surgeons:Bryan Alveria Apley, MD and Lauree Chandler, MD   Anesthesiologist:David Linna Caprice, MD  Dala Dock, MD  Pre-operative Echo Findings: ? Severe aortic stenosis ? Normalleft ventricular systolic function  Post-operative Echo Findings: ? Trivialparavalvular leak ? Normalleft ventricular systolic function  _____________   Echo 12/01/19: IMPRESSIONS  1. Left ventricular ejection fraction, by visual estimation, is 65 to 70%. The left ventricle has normal function. There is mildly increased left ventricular hypertrophy.  2. Elevated left ventricular end-diastolic pressure.  3. Left ventricular diastolic parameters are consistent with Grade II diastolic dysfunction (pseudonormalization).  4. Global right ventricle has normal systolic function.The right ventricular size is normal. No increase in right ventricular wall thickness.  5. Left atrial size was moderately dilated.  6. Right atrial size was mildly dilated.  7. The mitral valve is normal in structure. Trace mitral valve regurgitation. No evidence of mitral stenosis.  8. The tricuspid valve is normal in structure. Tricuspid valve regurgitation moderate.  9. The aortic valve is normal in structure. Aortic valve regurgitation is trivial. No evidence of aortic valve sclerosis or stenosis. 10. The pulmonic valve was normal in structure. Pulmonic valve regurgitation is mild. 11. Mildly elevated pulmonary artery systolic pressure. 12. The inferior vena cava is normal in size with greater than 50% respiratory  variability, suggesting right atrial pressure of 3 mmHg.  Aortic Valve: The aortic valve is normal in structure. Aortic valve regurgitation is trivial. The aortic valve is structurally normal, with no evidence of sclerosis or stenosis. Aortic valve mean gradient measures 15.0 mmHg. Aortic valve peak gradient measures 27.6 mmHg. Aortic valve area, by VTI measures 0.74 cm   ASSESSMENT & PLAN:   Severe AS s/p TAVR: doing well. Groin sites healing well. ECG with chronic afib and LBBB with QRS 164ms but no HAVB. Continued on chronic plavix and coumadin. SBE prophylaxis discussed; the patient is edentulous and does not go to the dentist. I will see her back at the end of this month for 1 month follow up and echo.    Chronic atrial fibrillation: rate well controlled. Continue Lopresor 50mg  BID. Back on coumadin. INR followed by our coumadin clinic.   HTN: BP well controlled today   Chronic diastolic CHF: appears euvolemic off diuretics.   CAD: pre TAVR cath showed  mild non obst CAD with a patent OM stent. Continue medical therapy with plavix, statin and bb.  Insomnia: she hasn't slept at night in days and then has to nap in the day. She takes alprazolam 0.25 mg QHS that hasn't helped. I recommended she try doubling the dose to 0.5mg  for a few days to get her sleep cycle back on track. If she has further issues I have asked her to call Dr. Dennard Schaumann.    Medication Adjustments/Labs and Tests Ordered: Current medicines are reviewed at length with the patient today.  Concerns regarding medicines are outlined above.  Medication changes, Labs and Tests ordered today are listed in the  Patient Instructions below. Patient Instructions  Medication Instructions:  Take 2 doses of Xanax 1 hour prior to bedtime. If this dose works for you, call Dr. Dennard Schaumann to adjust your prescription.   Follow-Up: Please keep your follow-up appointments as scheduled!    Signed, Angelena Form, PA-C  12/08/2019 4:40  PM    [facility], [place], [place]  [address] Phone: [phone]; Fax: [phone]

## 2019-12-08 ENCOUNTER — Ambulatory Visit: Payer: Medicare Other | Admitting: Physician Assistant

## 2019-12-08 ENCOUNTER — Encounter: Payer: Self-pay | Admitting: Physician Assistant

## 2019-12-08 ENCOUNTER — Other Ambulatory Visit: Payer: Self-pay

## 2019-12-08 VITALS — BP 108/68 | HR 76 | Ht 62.0 in | Wt 179.0 lb

## 2019-12-08 DIAGNOSIS — I5032 Chronic diastolic (congestive) heart failure: Secondary | ICD-10-CM

## 2019-12-08 DIAGNOSIS — I2510 Atherosclerotic heart disease of native coronary artery without angina pectoris: Secondary | ICD-10-CM | POA: Diagnosis not present

## 2019-12-08 DIAGNOSIS — I10 Essential (primary) hypertension: Secondary | ICD-10-CM

## 2019-12-08 DIAGNOSIS — I4819 Other persistent atrial fibrillation: Secondary | ICD-10-CM | POA: Diagnosis not present

## 2019-12-08 DIAGNOSIS — Z952 Presence of prosthetic heart valve: Secondary | ICD-10-CM | POA: Diagnosis not present

## 2019-12-08 NOTE — Patient Instructions (Signed)
Medication Instructions:  Take 2 doses of Xanax 1 hour prior to bedtime. If this dose works for you, call Dr. Dennard Schaumann to adjust your prescription.   Follow-Up: Please keep your follow-up appointments as scheduled!

## 2019-12-10 ENCOUNTER — Telehealth: Payer: Self-pay | Admitting: Family Medicine

## 2019-12-10 NOTE — Telephone Encounter (Signed)
Daughter called and states that the pt had not bee sleeping well at night. She saw cardiology the other day and they recommended that she double the does of her Xanax to .5mg . She did this and the pt is sleeping much much better. She would like to get a RX for the increased dose if possible?   Note from Cardiology, Marylene Land PA-C Insomnia: she hasn't slept at night in days and then has to nap in the day. She takes alprazolam 0.25 mg QHS that hasn't helped. I recommended she try doubling the dose to 0.5mg  for a few days to get her sleep cycle back on track. If she has further issues I have asked her to call Dr. Dennard Schaumann.

## 2019-12-13 ENCOUNTER — Other Ambulatory Visit: Payer: Self-pay | Admitting: Family Medicine

## 2019-12-13 MED ORDER — ALPRAZOLAM 0.5 MG PO TABS: 0.5000 mg | ORAL_TABLET | Freq: Every evening | ORAL | 0 refills | Status: DC | PRN

## 2019-12-13 NOTE — Telephone Encounter (Signed)
[name] aware

## 2019-12-13 NOTE — Telephone Encounter (Signed)
ok 

## 2019-12-14 ENCOUNTER — Ambulatory Visit: Payer: Medicare Other | Admitting: *Deleted

## 2019-12-14 ENCOUNTER — Other Ambulatory Visit: Payer: Self-pay

## 2019-12-14 DIAGNOSIS — Z5181 Encounter for therapeutic drug level monitoring: Secondary | ICD-10-CM | POA: Diagnosis not present

## 2019-12-14 DIAGNOSIS — I4819 Other persistent atrial fibrillation: Secondary | ICD-10-CM

## 2019-12-14 LAB — POCT INR: INR: 2.8 (ref 2.0–3.0)

## 2019-12-14 NOTE — Patient Instructions (Signed)
Description   Continue taking 2 tablets daily except for 1 tablet on Tuesdays.  Recheck INR in 2 weeks. Please call [phone], for any changes in medications or up coming procedures.

## 2019-12-29 ENCOUNTER — Encounter: Payer: Self-pay | Admitting: Physician Assistant

## 2019-12-29 ENCOUNTER — Ambulatory Visit: Payer: Medicare Other | Admitting: Physician Assistant

## 2019-12-29 ENCOUNTER — Ambulatory Visit (HOSPITAL_COMMUNITY): Payer: Medicare Other | Attending: Cardiology

## 2019-12-29 ENCOUNTER — Other Ambulatory Visit: Payer: Self-pay

## 2019-12-29 ENCOUNTER — Ambulatory Visit (INDEPENDENT_AMBULATORY_CARE_PROVIDER_SITE_OTHER): Payer: Medicare Other

## 2019-12-29 VITALS — BP 102/60 | HR 69 | Ht 62.0 in | Wt 167.0 lb

## 2019-12-29 DIAGNOSIS — I5032 Chronic diastolic (congestive) heart failure: Secondary | ICD-10-CM | POA: Diagnosis not present

## 2019-12-29 DIAGNOSIS — Z952 Presence of prosthetic heart valve: Secondary | ICD-10-CM

## 2019-12-29 DIAGNOSIS — I10 Essential (primary) hypertension: Secondary | ICD-10-CM | POA: Diagnosis not present

## 2019-12-29 DIAGNOSIS — I4819 Other persistent atrial fibrillation: Secondary | ICD-10-CM

## 2019-12-29 DIAGNOSIS — I2510 Atherosclerotic heart disease of native coronary artery without angina pectoris: Secondary | ICD-10-CM

## 2019-12-29 DIAGNOSIS — Z5181 Encounter for therapeutic drug level monitoring: Secondary | ICD-10-CM | POA: Diagnosis not present

## 2019-12-29 LAB — POCT INR: INR: 2.4 (ref 2.0–3.0)

## 2019-12-29 NOTE — Patient Instructions (Addendum)
Medication Instructions:  Your provider recommends that you continue on your current medications as directed. Please refer to the Current Medication list given to you today.    Testing/Procedures: We will call you to arrange your 1 year TAVR echocardiogram and subsequent office visit.  Follow-Up: You have an appointment with Dr. Debara Pickett scheduled 03/29/2020 at 11:15AM.

## 2019-12-29 NOTE — Patient Instructions (Signed)
Description   Continue on same dosage 2 tablets daily except for 1 tablet on Tuesdays.  Recheck INR in 4 weeks. Please call [phone], for any changes in medications or up coming procedures.

## 2019-12-29 NOTE — Progress Notes (Signed)
[facility]                                       Cardiology Office Note    Date:  12/29/2019   ID:  [name], DOB 09-Apr-1933, MRN [id number]  PCP:  [name], MD  Cardiologist: Pixie Casino, MD / Dr. Angelena Form & Dr. Cyndia Bent (TAVR)   CC: 1 month s/p TAVR  History of Present Illness:  [name] is a 85 y.o. female with a history of HTN, HLD, hypothyroidism, sleep apnea, dementia, persistent atrial fibrillation on warfarin, CAD s/p NSTEMI (OM treated with a BMS 01/2015), limited mobility and severe aortic stenosis s/p TAVR (11/30/19) who presents to clinic for follow up.   She is known to have CAD and had an obtuse marginal treated with a bare metal stent in February 2016. Mild disease in the LAD and RCA. She has persistent atrial fibrillation and has been on coumadin. She has failed previous cardioversion attempts. She has been followed for moderate aortic stenosis for several years which recently progressed to severe. Most recent echo 10/04/19 with LVEF=60-65%. The aortic valve leaflets are thickened with poor leaflet excursion, mean gradient 47 mmHg,peak gradient 71mmHg, AVA 0.69 cm2, dimensionless index 0.18. Pre TAVR cath showed mild non obst CAD with a patent OM stent as well as severe AS with a mean gradient of 34.8 mm Hg.   She was evaluated by the multidisciplinary valve team and underwent successful TAVR with a 23 mm Edwards S3U THV via the TF approach on 11/30/19. Post op echo showed EF 65%, normally functioning TAVR with mean gradient of 15 mm Hg and trivial PVL. She was continued on her chronic coumadin and plavix.  Today she presents to clinic for follow up. Here with her daughter. Still not sleeping well but has good and bad days. No CP or SOB. No LE edema. She has chronic orthopnea but no PND. No dizziness or syncope. No blood in stool or urine. No palpitations.    Past Medical History:  Diagnosis Date    . Atrial fibrillation, persistent (HCC)    Atrial fibrillation  . CAD (coronary artery disease)    a. 01/2015 NSTEMI/PCI: LM nl, LAD 30p, LCX nondom, 30-40p, 50d, OM1 100p (2.75x16 Rebel BMS), RCA mild diff plaque, EF 55%.  . Cancer ([place])   . Candida infection, disseminated ([facility])   . Dementia ([place])   . Hyperlipidemia   . Hypertension   . Hypothyroid   . Osteoporosis   . S/P TAVR (transcatheter aortic valve replacement)    s/p TAVR with 79mm Edwards S3U THV via the TF approach  . Severe aortic stenosis   . Sleep apnea 04/2012   Mild/ AHI 10/CPAP 7cm h2o w/2 L o2    Past Surgical History:  Procedure Laterality Date  . CARDIAC CATHETERIZATION  11/10/2019   RIGHT/LEFT HEART CATH AND CORONARY ANGIOGRAPHY   . CARDIOVERSION N/A 08/22/2015   Procedure: CARDIOVERSION;  Surgeon: Skeet Latch, MD;  Location: [facility];  Service: Cardiovascular;  Laterality: N/A;  . JOINT REPLACEMENT Left    knee  . KNEE SURGERY    . LEFT HEART CATHETERIZATION WITH CORONARY ANGIOGRAM N/A 02/20/2015   Procedure: LEFT HEART CATHETERIZATION WITH CORONARY ANGIOGRAM;  Surgeon: Jolaine Artist, MD;  Location[facility] C[facility] LAB;  Service: Cardiovascular;  Laterality: N/A;  . PERCUTANEOUS  CORONARY STENT INTERVENTION (PCI-S)  02/20/2015   Procedure: PERCUTANEOUS CORONARY STENT INTERVENTION (PCI-S);  Surgeon: Jolaine Artist, MD;  Location: [facility] C[facility] LAB;  Service: Cardiovascular;;  OM1  (2.75/54mm Rebel)  . RIGHT/LEFT HEART CATH AND CORONARY ANGIOGRAPHY N/A 11/10/2019   Procedure: RIGHT/LEFT HEART CATH AND CORONARY ANGIOGRAPHY;  Surgeon: Burnell Blanks, MD;  Location: [facility] CV LAB;  Service: Cardiovascular;  Laterality: N/A;  . TEE WITHOUT CARDIOVERSION N/A 11/30/2019   Procedure: TRANSESOPHAGEAL ECHOCARDIOGRAM (TEE);  Surgeon: Burnell Blanks, MD;  Location: [facility];  Service: Open Heart Surgery;  Laterality: N/A;  . TONSILLECTOMY    . TRANSCATHETER AORTIC VALVE REPLACEMENT, TRANSFEMORAL N/A  11/30/2019   Procedure: TRANSCATHETER AORTIC VALVE REPLACEMENT, TRANSFEMORAL;  Surgeon: Burnell Blanks, MD;  Location: [facility];  Service: Open Heart Surgery;  Laterality: N/A;    Current Medications: Outpatient Medications Prior to Visit  Medication Sig Dispense Refill  . ALPRAZolam (XANAX) 0.5 MG tablet Take 1 tablet (0.5 mg total) by mouth at bedtime as needed for sleep. 30 tablet 0  . amLODipine (NORVASC) 5 MG tablet TAKE 1 TABLET (5 MG TOTAL) DAILY BY MOUTH. 90 tablet 1  . atorvastatin (LIPITOR) 80 MG tablet Take 80 mg by mouth daily.    . Calcium Citrate (CITRACAL PO) Take 1 tablet by mouth 2 (two) times daily.    . cholecalciferol (VITAMIN D) 1000 UNITS tablet Take 1,000 Units by mouth daily.    . clopidogrel (PLAVIX) 75 MG tablet Take 75 mg by mouth daily.    . cyanocobalamin 1000 MCG tablet Take 1,000 mcg by mouth daily.     Marland Kitchen levothyroxine (SYNTHROID) 137 MCG tablet Take 137 mcg by mouth daily before breakfast.    . lisinopril (ZESTRIL) 5 MG tablet Take 1 tablet (5 mg total) by mouth daily. 30 tablet 9  . metoprolol tartrate (LOPRESSOR) 50 MG tablet Take 50 mg by mouth 2 (two) times daily.    . pantoprazole (PROTONIX) 40 MG tablet Take 40 mg by mouth daily.    . traMADol (ULTRAM) 50 MG tablet Take 1 tablet (50 mg total) by mouth every 8 (eight) hours as needed. 30 tablet 0  . warfarin (COUMADIN) 1 MG tablet TAKE 2-3 TABLETS BY MOUTH DAILY AS DIRECTED BY COUMADIN CLINIC (Patient taking differently: Take 1-2 mg by mouth See admin instructions. Take 2 mg by mouth everyday except Tuesday at 1800, on Tuesday take 1 mg at 1800) 240 tablet 1   Facility-Administered Medications Prior to Visit  Medication Dose Route Frequency Provider Last Rate Last Admin  . denosumab (PROLIA) injection 60 mg  60 mg Subcutaneous Q6 months [name], MD   60 mg at 11/29/19 [id number]     Allergies:   Fosamax [alendronate sodium]   Social History   Socioeconomic History  . Marital status: Married     Spouse name: Not on file  . Number of children: Not on file  . Years of education: Not on file  . Highest education level: Not on file  Occupational History  . Not on file  Tobacco Use  . Smoking status: Former Smoker    Quit date: 04/28/1995    Years since quitting: 24.6  . Smokeless tobacco: Never Used  Substance and Sexual Activity  . Alcohol use: No    Alcohol/week: 0.0 standard drinks  . Drug use: No  . Sexual activity: Not on file  Other Topics Concern  . Not on file  Social History Narrative  . Not on  file   Social Determinants of Health   Financial Resource Strain:   . Difficulty of Paying Living Expenses: Not on file  Food Insecurity:   . Worried About Charity fundraiser in the Last Year: Not on file  . Ran Out of Food in the Last Year: Not on file  Transportation Needs:   . Lack of Transportation (Medical): Not on file  . Lack of Transportation (Non-Medical): Not on file  Physical Activity:   . Days of Exercise per Week: Not on file  . Minutes of Exercise per Session: Not on file  Stress:   . Feeling of Stress : Not on file  Social Connections:   . Frequency of Communication with Friends and Family: Not on file  . Frequency of Social Gatherings with Friends and Family: Not on file  . Attends Religious Services: Not on file  . Active Member of Clubs or Organizations: Not on file  . Attends Archivist Meetings: Not on file  . Marital Status: Not on file     Family History:  The patient's family history includes Arthritis in her mother; Coronary artery disease in her father; Diabetes in her father; Stroke in her mother.     ROS:   Please see the history of present illness.    ROS All other systems reviewed and are negative.   PHYSICAL EXAM:   VS:  BP 102/60   Pulse 69   Ht 5\' 2"  (1.575 m)   Wt 167 lb (75.8 kg)   SpO2 100%   BMI 30.54 kg/m    GEN: Well nourished, well developed, in no acute distress HEENT: normal Neck: no JVD or  masses Cardiac: irreg irreg; no murmurs, rubs, or gallops,no edema  Respiratory:  clear to auscultation bilaterally, normal work of breathing GI: soft, nontender, nondistended, + BS MS: no deformity or atrophy Skin: warm and dry, no rash Neuro:  Alert and Oriented x 3, Strength and sensation are intact Psych: euthymic mood, full affect   Wt Readings from Last 3 Encounters:  12/29/19 167 lb (75.8 kg)  12/08/19 179 lb (81.2 kg)  12/01/19 174 lb 3.2 oz (79 kg)      Studies/Labs Reviewed:   EKG:  EKG is NOT ordered today.    Recent Labs: 11/26/2019: ALT 14; B Natriuretic Peptide 286.4 12/01/2019: BUN 14; Creatinine, Ser 0.88; Hemoglobin 11.4; Magnesium 1.4; Platelets 171; Potassium 3.8; Sodium 139   Lipid Panel    Component Value Date/Time   CHOL 116 09/30/2017 0826   TRIG 134 09/30/2017 0826   HDL 41 (L) 09/30/2017 0826   CHOLHDL 2.8 09/30/2017 0826   VLDL 18 09/19/2016 1036   LDLCALC 53 09/30/2017 0826    Additional studies/ records that were reviewed today include:  TAVR OPERATIVE NOTE   Date of Procedure:11/30/2019  Preoperative Diagnosis:Severe Aortic Stenosis   Postoperative Diagnosis:Same   Procedure:   Transcatheter Aortic Valve Replacement - PercutaneousRightTransfemoral Approach Edwards Sapien 3 Ultra THV (size 38mm, model # 9750TFX, serial # [id number])  Co-Surgeons:Bryan Alveria Apley, MD and Lauree Chandler, MD   Anesthesiologist:David Linna Caprice, MD  Dala Dock, MD  Pre-operative Echo Findings: ? Severe aortic stenosis ? Normalleft ventricular systolic function  Post-operative Echo Findings: ? Trivialparavalvular leak ? Normalleft ventricular systolic function  _____________   Echo 12/01/19: IMPRESSIONS  1. Left ventricular ejection fraction, by visual estimation, is 65 to 70%. The left ventricle  has normal function. There is mildly increased left ventricular hypertrophy.  2. Elevated left ventricular end-diastolic  pressure.  3. Left ventricular diastolic parameters are consistent with Grade II diastolic dysfunction (pseudonormalization).  4. Global right ventricle has normal systolic function.The right ventricular size is normal. No increase in right ventricular wall thickness.  5. Left atrial size was moderately dilated.  6. Right atrial size was mildly dilated.  7. The mitral valve is normal in structure. Trace mitral valve regurgitation. No evidence of mitral stenosis.  8. The tricuspid valve is normal in structure. Tricuspid valve regurgitation moderate.  9. The aortic valve is normal in structure. Aortic valve regurgitation is trivial. No evidence of aortic valve sclerosis or stenosis. 10. The pulmonic valve was normal in structure. Pulmonic valve regurgitation is mild. 11. Mildly elevated pulmonary artery systolic pressure. 12. The inferior vena cava is normal in size with greater than 50% respiratory variability, suggesting right atrial pressure of 3 mmHg.  Aortic Valve: The aortic valve is normal in structure. Aortic valve regurgitation is trivial. The aortic valve is structurally normal, with no evidence of sclerosis or stenosis. Aortic valve mean gradient measures 15.0 mmHg. Aortic valve peak gradient measures 27.6 mmHg. Aortic valve area, by VTI measures 0.74 cm  _____________  Echo 12/29/19 IMPRESSIONS  1. Left ventricular ejection fraction, by visual estimation, is 65 to 70%. The left ventricle has hyperdynamic function. There is no left ventricular hypertrophy.  2. Left ventricular diastolic function could not be evaluated.  3. The left ventricle has no regional wall motion abnormalities.  4. Global right ventricle has normal systolic function.The right ventricular size is normal. No increase in right ventricular wall thickness.  5. Left atrial size was severely  dilated.  6. Right atrial size was moderately dilated.  7. The mitral valve is normal in structure. Mild mitral valve regurgitation. No evidence of mitral stenosis.  8. The tricuspid valve is normal in structure.  9. Aortic valve regurgitation is not visualized. No evidence of aortic valve sclerosis or stenosis. 10. The pulmonic valve was normal in structure. Pulmonic valve regurgitation is not visualized. 11. Moderately elevated pulmonary artery systolic pressure. 12. The tricuspid regurgitant velocity is 3.24 m/s, and with an assumed right atrial pressure of 3 mmHg, the estimated right ventricular systolic pressure is moderately elevated at 45.0 mmHg. 13. The inferior vena cava is normal in size with greater than 50% respiratory variability, suggesting right atrial pressure of 3 mmHg. 14. Stable post TAVR findings, mean transaortic gradient 7 mmHg, trivial paravalvular leak.  ASSESSMENT & PLAN:   Severe AS s/p TAVR: echo today shows EF 65%, normally functioning TAVR with a mean gradient of 7 mm Hg and trivial PVL. She has NYHA class I symptoms, but is not very active. SBE prophylaxis discussed; the patient is edentulous and does not go to the dentist. She has been chronically treated with plavix and coumadin which will be continued. I will see her back in 1 year for follow up and echo.    Chronic atrial fibrillation: rate well controlled today. Continue coumadin for thromboembolic prophylaxis.   HTN: BP well controlled today. Continue current regimen.   Chronic diastolic CHF:  CAD: pre TAVR cath showed  mild non obst CAD with a patent OM stent. Continue medical therapy with plavix, statin and bb.   Medication Adjustments/Labs and Tests Ordered: Current medicines are reviewed at length with the patient today.  Concerns regarding medicines are outlined above.  Medication changes, Labs and Tests ordered today are listed in the Patient Instructions below. Patient Instructions  Medication  Instructions:  Your provider recommends that  you continue on your current medications as directed. Please refer to the Current Medication list given to you today.    Testing/Procedures: We will call you to arrange your 1 year TAVR echocardiogram and subsequent office visit.  Follow-Up: You have an appointment with Dr. Debara Pickett scheduled 03/29/2020 at 11:15AM.     Signed, Angelena Form, PA-C  12/29/2019 3:39 PM    [address], [place], [place]  [address] Phone: [phone]; Fax: [phone]

## 2020-01-04 ENCOUNTER — Ambulatory Visit (INDEPENDENT_AMBULATORY_CARE_PROVIDER_SITE_OTHER): Payer: Medicare Other | Admitting: Family Medicine

## 2020-01-04 ENCOUNTER — Encounter: Payer: Self-pay | Admitting: Family Medicine

## 2020-01-04 ENCOUNTER — Other Ambulatory Visit: Payer: Self-pay

## 2020-01-04 VITALS — BP 92/62 | HR 93 | Temp 96.8°F | Resp 18 | Ht <= 58 in | Wt 166.0 lb

## 2020-01-04 DIAGNOSIS — F05 Delirium due to known physiological condition: Secondary | ICD-10-CM | POA: Diagnosis not present

## 2020-01-04 LAB — URINALYSIS, ROUTINE W REFLEX MICROSCOPIC
Bilirubin Urine: NEGATIVE
Glucose, UA: NEGATIVE
Hyaline Cast: NONE SEEN /LPF
Ketones, ur: NEGATIVE
Nitrite: POSITIVE — AB
Specific Gravity, Urine: 1.02 (ref 1.001–1.03)
pH: 7.5 (ref 5.0–8.0)

## 2020-01-04 LAB — MICROSCOPIC MESSAGE

## 2020-01-04 MED ORDER — QUETIAPINE FUMARATE 25 MG PO TABS: 25.0000 mg | ORAL_TABLET | Freq: Every day | ORAL | 0 refills | Status: DC

## 2020-01-04 NOTE — Progress Notes (Signed)
Subjective:    Patient ID: [name], female    DOB: 08/20/1933, 86 y.o.   MRN: [id number]  HPI  Patient is an 86 year old Caucasian female who was hospitalized 12/1-12/2  for a transcatheter aortic valve replacement with a bioprosthetic valve.  Past medical history is complicated by atrial fibrillation on Coumadin along with dementia.  She also has underlying history of coronary artery disease with a non-ST elevation myocardial infarction in 2016 and chronic diastolic HF.   She is here today with her daughter.  Her daughter states that ever since her surgery, her confusion at night has gotten worse.  The confusion predated the surgery but it has definitely worsened since the surgery.  The daughter states that she is trying to wander from the home at night even though she is living in her current home.  Her daughter lives with her.  She has 24/7 supervision however the daughter is confused that she may leave the house accidentally at night.  She is also becoming more combative at night.  She is waking up every 10 to 15 minutes trying to get out of bed, etc.  She has fallen on one occasion.  Daughter independently increase Xanax to 0.5 mg however this seemed to have an opposite effect and cause the patient to become more confused and more delirious.  She denies any fever or chills or signs of any infection.  She is not fluid overloaded on today's exam and appears completely normal.  Her lungs are completely clear.  She denies any dysuria  Past Medical History:  Diagnosis Date  . Atrial fibrillation, persistent (HCC)    Atrial fibrillation  . CAD (coronary artery disease)    a. 01/2015 NSTEMI/PCI: LM nl, LAD 30p, LCX nondom, 30-40p, 50d, OM1 100p (2.75x16 Rebel BMS), RCA mild diff plaque, EF 55%.  . Cancer ([place])   . Candida infection, disseminated ([place])   . Dementia ([place])   . Hyperlipidemia   . Hypertension   . Hypothyroid   . Osteoporosis   . S/P TAVR (transcatheter aortic valve  replacement)    s/p TAVR with 12mm Edwards S3U THV via the TF approach  . Severe aortic stenosis   . Sleep apnea 04/2012   Mild/ AHI 10/CPAP 7cm h2o w/2 L o2   Past Surgical History:  Procedure Laterality Date  . CARDIAC CATHETERIZATION  11/10/2019   RIGHT/LEFT HEART CATH AND CORONARY ANGIOGRAPHY   . CARDIOVERSION N/A 08/22/2015   Procedure: CARDIOVERSION;  Surgeon: Skeet Latch, MD;  Location: [facility];  Service: Cardiovascular;  Laterality: N/A;  . JOINT REPLACEMENT Left    knee  . KNEE SURGERY    . LEFT HEART CATHETERIZATION WITH CORONARY ANGIOGRAM N/A 02/20/2015   Procedure: LEFT HEART CATHETERIZATION WITH CORONARY ANGIOGRAM;  Surgeon: Jolaine Artist, MD;  Location: [facility] CATH LAB;  Service: Cardiovascular;  Laterality: N/A;  . PERCUTANEOUS CORONARY STENT INTERVENTION (PCI-S)  02/20/2015   Procedure: PERCUTANEOUS CORONARY STENT INTERVENTION (PCI-S);  Surgeon: Jolaine Artist, MD;  Location: [facility] CATH LAB;  Service: Cardiovascular;;  OM1  (2.75/71mm Rebel)  . RIGHT/LEFT HEART CATH AND CORONARY ANGIOGRAPHY N/A 11/10/2019   Procedure: RIGHT/LEFT HEART CATH AND CORONARY ANGIOGRAPHY;  Surgeon: Burnell Blanks, MD;  Location: [place] CV LAB;  Service: Cardiovascular;  Laterality: N/A;  . TEE WITHOUT CARDIOVERSION N/A 11/30/2019   Procedure: TRANSESOPHAGEAL ECHOCARDIOGRAM (TEE);  Surgeon: Burnell Blanks, MD;  Location: [facility];  Service: Open Heart Surgery;  Laterality: N/A;  . TONSILLECTOMY    .  TRANSCATHETER AORTIC VALVE REPLACEMENT, TRANSFEMORAL N/A 11/30/2019   Procedure: TRANSCATHETER AORTIC VALVE REPLACEMENT, TRANSFEMORAL;  Surgeon: Burnell Blanks, MD;  Location: [facility];  Service: Open Heart Surgery;  Laterality: N/A;   Current Outpatient Medications on File Prior to Visit  Medication Sig Dispense Refill  . ALPRAZolam (XANAX) 0.5 MG tablet Take 1 tablet (0.5 mg total) by mouth at bedtime as needed for sleep. 30 tablet 0  . amLODipine (NORVASC) 5 MG  tablet TAKE 1 TABLET (5 MG TOTAL) DAILY BY MOUTH. 90 tablet 1  . atorvastatin (LIPITOR) 80 MG tablet Take 80 mg by mouth daily.    . Calcium Citrate (CITRACAL PO) Take 1 tablet by mouth 2 (two) times daily.    . cholecalciferol (VITAMIN D) 1000 UNITS tablet Take 1,000 Units by mouth daily.    . clopidogrel (PLAVIX) 75 MG tablet Take 75 mg by mouth daily.    . cyanocobalamin 1000 MCG tablet Take 1,000 mcg by mouth daily.     Marland Kitchen levothyroxine (SYNTHROID) 137 MCG tablet Take 137 mcg by mouth daily before breakfast.    . lisinopril (ZESTRIL) 5 MG tablet Take 1 tablet (5 mg total) by mouth daily. 30 tablet 9  . metoprolol tartrate (LOPRESSOR) 50 MG tablet Take 50 mg by mouth 2 (two) times daily.    . pantoprazole (PROTONIX) 40 MG tablet Take 40 mg by mouth daily.    . traMADol (ULTRAM) 50 MG tablet Take 1 tablet (50 mg total) by mouth every 8 (eight) hours as needed. 30 tablet 0  . warfarin (COUMADIN) 1 MG tablet TAKE 2-3 TABLETS BY MOUTH DAILY AS DIRECTED BY COUMADIN CLINIC (Patient taking differently: Take 1-2 mg by mouth See admin instructions. Take 2 mg by mouth everyday except Tuesday at 1800, on Tuesday take 1 mg at 1800) 240 tablet 1   Current Facility-Administered Medications on File Prior to Visit  Medication Dose Route Frequency Provider Last Rate Last Admin  . denosumab (PROLIA) injection 60 mg  60 mg Subcutaneous Q6 months [name], MD   60 mg at 11/29/19 0950    Allergies  Allergen Reactions  . Fosamax [Alendronate Sodium] Other (See Comments)    Unknown per daughter   Social History   Socioeconomic History  . Marital status: Married    Spouse name: Not on file  . Number of children: Not on file  . Years of education: Not on file  . Highest education level: Not on file  Occupational History  . Not on file  Tobacco Use  . Smoking status: Former Smoker    Quit date: 04/28/1995    Years since quitting: 24.7  . Smokeless tobacco: Never Used  Substance and Sexual  Activity  . Alcohol use: No    Alcohol/week: 0.0 standard drinks  . Drug use: No  . Sexual activity: Not on file  Other Topics Concern  . Not on file  Social History Narrative  . Not on file   Social Determinants of Health   Financial Resource Strain:   . Difficulty of Paying Living Expenses: Not on file  Food Insecurity:   . Worried About Charity fundraiser in the Last Year: Not on file  . Ran Out of Food in the Last Year: Not on file  Transportation Needs:   . Lack of Transportation (Medical): Not on file  . Lack of Transportation (Non-Medical): Not on file  Physical Activity:   . Days of Exercise per Week: Not on file  . Minutes  of Exercise per Session: Not on file  Stress:   . Feeling of Stress : Not on file  Social Connections:   . Frequency of Communication with Friends and Family: Not on file  . Frequency of Social Gatherings with Friends and Family: Not on file  . Attends Religious Services: Not on file  . Active Member of Clubs or Organizations: Not on file  . Attends Archivist Meetings: Not on file  . Marital Status: Not on file  Intimate Partner Violence:   . Fear of Current or Ex-Partner: Not on file  . Emotionally Abused: Not on file  . Physically Abused: Not on file  . Sexually Abused: Not on file     Review of Systems  All other systems reviewed and are negative.      Objective:   Physical Exam  Constitutional: She appears well-developed and well-nourished. No distress.  Eyes: Conjunctivae are normal.  Neck: No JVD present.  Cardiovascular: Normal rate. An irregularly irregular rhythm present.  Murmur heard. Pulmonary/Chest: Effort normal. No respiratory distress. She has no wheezes. She has no rhonchi.  Abdominal: Soft. Bowel sounds are normal. She exhibits no distension. There is no abdominal tenderness. There is no rebound and no guarding.  Musculoskeletal:        General: No edema.     Right lower leg: No edema.  Lymphadenopathy:     She has no cervical adenopathy.  Skin: She is not diaphoretic. No erythema.  Vitals reviewed.         Assessment & Plan:  Sundowning - Plan: CBC with Differential, COMPLETE METABOLIC PANEL WITH GFR, Urinalysis, Routine w reflex microscopic  Patient appears to be sundowning.  We will begin Seroquel 25 mg p.o. nightly and then reassess in 1 week.  Obtain urinalysis to rule out urinary tract infection.  There is no evidence of a stroke on today's exam.  She denies any headaches.  There is no evidence of any increased intracranial pressure to make me suspect an intracranial hemorrhage due to Coumadin.  Her QTc interval was 500 on her most recent EKG.  If we need to increase beyond 50 mg of Seroquel at night I would definitely repeat an EKG prior to doing that.  Otherwise we discussed the increased risk of stroke and the patient's daughter is willing to accept the risk due to the other potential risk of her wandering from her home due to confusion.

## 2020-01-05 ENCOUNTER — Telehealth: Payer: Self-pay | Admitting: Family Medicine

## 2020-01-05 ENCOUNTER — Other Ambulatory Visit: Payer: Self-pay | Admitting: Family Medicine

## 2020-01-05 LAB — COMPLETE METABOLIC PANEL WITH GFR
AG Ratio: 1.3 (calc) (ref 1.0–2.5)
ALT: 12 U/L (ref 6–29)
AST: 22 U/L (ref 10–35)
Albumin: 4 g/dL (ref 3.6–5.1)
Alkaline phosphatase (APISO): 60 U/L (ref 37–153)
BUN/Creatinine Ratio: 20 (calc) (ref 6–22)
BUN: 25 mg/dL (ref 7–25)
CO2: 25 mmol/L (ref 20–32)
Calcium: 8.7 mg/dL (ref 8.6–10.4)
Chloride: 105 mmol/L (ref 98–110)
Creat: 1.27 mg/dL — ABNORMAL HIGH (ref 0.60–0.88)
GFR, Est African American: 45 mL/min/{1.73_m2} — ABNORMAL LOW (ref >=60)
GFR, Est Non African American: 39 mL/min/{1.73_m2} — ABNORMAL LOW (ref >=60)
Globulin: 3 g/dL (calc) (ref 1.9–3.7)
Glucose, Bld: 97 mg/dL (ref 65–99)
Potassium: 3.9 mmol/L (ref 3.5–5.3)
Sodium: 139 mmol/L (ref 135–146)
Total Bilirubin: 0.9 mg/dL (ref 0.2–1.2)
Total Protein: 7 g/dL (ref 6.1–8.1)

## 2020-01-05 LAB — CBC WITH DIFFERENTIAL/PLATELET
Absolute Monocytes: 530 cells/uL (ref 200–950)
Basophils Absolute: 39 cells/uL (ref 0–200)
Basophils Relative: 0.5 %
Eosinophils Absolute: 140 cells/uL (ref 15–500)
Eosinophils Relative: 1.8 %
HCT: 39.4 % (ref 35.0–45.0)
Hemoglobin: 12.8 g/dL (ref 11.7–15.5)
Lymphs Abs: 1334 cells/uL (ref 850–3900)
MCH: 29 pg (ref 27.0–33.0)
MCHC: 32.5 g/dL (ref 32.0–36.0)
MCV: 89.1 fL (ref 80.0–100.0)
MPV: 10.6 fL (ref 7.5–12.5)
Monocytes Relative: 6.8 %
Neutro Abs: 5756 cells/uL (ref 1500–7800)
Neutrophils Relative %: 73.8 %
Platelets: 212 10*3/uL (ref 140–400)
RBC: 4.42 10*6/uL (ref 3.80–5.10)
RDW: 13.4 % (ref 11.0–15.0)
Total Lymphocyte: 17.1 %
WBC: 7.8 10*3/uL (ref 3.8–10.8)

## 2020-01-05 MED ORDER — CEPHALEXIN 500 MG PO CAPS: 500.0000 mg | ORAL_CAPSULE | Freq: Three times a day (TID) | ORAL | 0 refills | Status: DC

## 2020-01-05 NOTE — Telephone Encounter (Signed)
Pt's daughter called and states that the seroquel seemed to hype up her mom and she got no sleep at all. I informed her to give her the Xanax tonight and do not let her sleep until it gets dark, no napping during the day. She states that she has not slept all night and has been awake most of the day.  She wanted to know what else we could do for her to help her sleep at night?

## 2020-01-06 NOTE — Telephone Encounter (Signed)
Called and lmovm with provider recommendations

## 2020-01-06 NOTE — Telephone Encounter (Signed)
Lets treat the uti found in the urine sample. This may be causing her increasing confusion. I would still try the seroquel as one night may be due to uti.  If not improving we could stop seroquel and try haldol 0.5 mg poqhs

## 2020-01-11 ENCOUNTER — Encounter: Payer: Self-pay | Admitting: Family Medicine

## 2020-01-11 ENCOUNTER — Other Ambulatory Visit: Payer: Self-pay

## 2020-01-11 ENCOUNTER — Ambulatory Visit (INDEPENDENT_AMBULATORY_CARE_PROVIDER_SITE_OTHER): Payer: Medicare Other | Admitting: Family Medicine

## 2020-01-11 VITALS — BP 100/60 | HR 76 | Temp 96.3°F | Resp 12 | Ht <= 58 in

## 2020-01-11 DIAGNOSIS — F05 Delirium due to known physiological condition: Secondary | ICD-10-CM

## 2020-01-11 DIAGNOSIS — R000 Tachycardia, unspecified: Secondary | ICD-10-CM

## 2020-01-11 MED ORDER — SULFAMETHOXAZOLE-TRIMETHOPRIM 800-160 MG PO TABS: 1.0000 | ORAL_TABLET | Freq: Two times a day (BID) | ORAL | 0 refills | Status: DC

## 2020-01-11 NOTE — Progress Notes (Signed)
Subjective:    Patient ID: [name], female    DOB: [birth year], 86 y.o.   MRN: [id number]  HPI  01/04/20 Patient is an 86 year old Caucasian female who was hospitalized 12/1-12/2  for a transcatheter aortic valve replacement with a bioprosthetic valve.  Past medical history is complicated by atrial fibrillation on Coumadin along with dementia.  She also has underlying history of coronary artery disease with a non-ST elevation myocardial infarction in 2016 and chronic diastolic HF.   She is here today with her daughter.  Her daughter states that ever since her surgery, her confusion at night has gotten worse.  The confusion predated the surgery but it has definitely worsened since the surgery.  The daughter states that she is trying to wander from the home at night even though she is living in her current home.  Her daughter lives with her.  She has 24/7 supervision however the daughter is confused that she may leave the house accidentally at night.  She is also becoming more combative at night.  She is waking up every 10 to 15 minutes trying to get out of bed, etc.  She has fallen on one occasion.  Daughter independently increase Xanax to 0.5 mg however this seemed to have an opposite effect and cause the patient to become more confused and more delirious.  She denies any fever or chills or signs of any infection.  She is not fluid overloaded on today's exam and appears completely normal.  Her lungs are completely clear.  She denies any dysuria  At that time, my plan was: Patient appears to be sundowning.  We will begin Seroquel 25 mg p.o. nightly and then reassess in 1 week.  Obtain urinalysis to rule out urinary tract infection.  There is no evidence of a stroke on today's exam.  She denies any headaches.  There is no evidence of any increased intracranial pressure to make me suspect an intracranial hemorrhage due to Coumadin.  Her QTc interval was 500 on her most recent EKG.  If we need to increase  beyond 50 mg of Seroquel at night I would definitely repeat an EKG prior to doing that.  Otherwise we discussed the increased risk of stroke and the patient's daughter is willing to accept the risk due to the other potential risk of her wandering from her home due to confusion.  01/11/20 Patient's daughter discontinued Seroquel after three doses because the medication was causing her to "be delusional and hallucinating". She is becoming increasingly confused. Please see the urinalysis from her last visit which appear to show a urinary tract infection. I treated the patient with Keflex 500 mg 3 times daily for 7 days given the fact that she was on Coumadin. The patient has not seen any benefit. Her daughter states that she is constantly going to the bathroom to urinate however when she goes she does not have to pee. Instead she has the feel that she needs to take but she will simply sit on the toilet and produce very little urine. Therefore to summarize, the patient is having increased urgency and frequency and hesitancy with decreased urine output and she is also becoming increasingly more confused. The patient is unable to provide a urine sample today. She went to the restroom on three separate occasions and was unable to produce any urine. On exam, the patient is tachycardic. Heart rate is between 101 110 bpm. EKG was obtained today which shows atrial fibrillation. There is nonspecific mild  downsloping ST depression in lead I and aVL possibly showing strain from tachycardia. The patient is drinking very little. Her appetite is very poor. She is more confused at home. She is also constipated. The daughter states that she has not had a bowel movement in the last week. She has had small pebble-like bowel movements. At times she sits on the toilet and "digs out small stool balls".  Past Medical History:  Diagnosis Date  . Atrial fibrillation, persistent (HCC)    Atrial fibrillation  . CAD (coronary artery  disease)    a. 01/2015 NSTEMI/PCI: LM nl, LAD 30p, LCX nondom, 30-40p, 50d, OM1 100p (2.75x16 Rebel BMS), RCA mild diff plaque, EF 55%.  . Cancer ([place])   . Candida infection, disseminated ([place])   . Dementia ([place])   . Hyperlipidemia   . Hypertension   . Hypothyroid   . Osteoporosis   . S/P TAVR (transcatheter aortic valve replacement)    s/p TAVR with 64mm Edwards S3U THV via the TF approach  . Severe aortic stenosis   . Sleep apnea 04/2012   Mild/ AHI 10/CPAP 7cm h2o w/2 L o2   Past Surgical History:  Procedure Laterality Date  . CARDIAC CATHETERIZATION  11/10/2019   RIGHT/LEFT HEART CATH AND CORONARY ANGIOGRAPHY   . CARDIOVERSION N/A 08/22/2015   Procedure: CARDIOVERSION;  Surgeon: Skeet Latch, MD;  Location: [place];  Service: Cardiovascular;  Laterality: N/A;  . JOINT REPLACEMENT Left    knee  . KNEE SURGERY    . LEFT HEART CATHETERIZATION WITH CORONARY ANGIOGRAM N/A 02/20/2015   Procedure: LEFT HEART CATHETERIZATION WITH CORONARY ANGIOGRAM;  Surgeon: Jolaine Artist, MD;  Location: [facility] CATH LAB;  Service: Cardiovascular;  Laterality: N/A;  . PERCUTANEOUS CORONARY STENT INTERVENTION (PCI-S)  02/20/2015   Procedure: PERCUTANEOUS CORONARY STENT INTERVENTION (PCI-S);  Surgeon: Jolaine Artist, MD;  Location[facility] CATH LAB;  Service: Cardiovascular;;  OM1  (2.75/58mm Rebel)  . RIGHT/LEFT HEART CATH AND CORONARY ANGIOGRAPHY N/A 11/10/2019   Procedure: RIGHT/LEFT HEART CATH AND CORONARY ANGIOGRAPHY;  Surgeon: Burnell Blanks, MD;  Location: [place] CV LAB;  Service: Cardiovascular;  Laterality: N/A;  . TEE WITHOUT CARDIOVERSION N/A 11/30/2019   Procedure: TRANSESOPHAGEAL ECHOCARDIOGRAM (TEE);  Surgeon: Burnell Blanks, MD;  Location: [place];  Service: Open Heart Surgery;  Laterality: N/A;  . TONSILLECTOMY    . TRANSCATHETER AORTIC VALVE REPLACEMENT, TRANSFEMORAL N/A 11/30/2019   Procedure: TRANSCATHETER AORTIC VALVE REPLACEMENT, TRANSFEMORAL;  Surgeon:  Burnell Blanks, MD;  Location: [place];  Service: Open Heart Surgery;  Laterality: N/A;   Current Outpatient Medications on File Prior to Visit  Medication Sig Dispense Refill  . ALPRAZolam (XANAX) 0.5 MG tablet Take 1 tablet (0.5 mg total) by mouth at bedtime as needed for sleep. 30 tablet 0  . amLODipine (NORVASC) 5 MG tablet TAKE 1 TABLET (5 MG TOTAL) DAILY BY MOUTH. 90 tablet 1  . atorvastatin (LIPITOR) 80 MG tablet Take 80 mg by mouth daily.    . Calcium Citrate (CITRACAL PO) Take 1 tablet by mouth 2 (two) times daily.    . cephALEXin (KEFLEX) 500 MG capsule Take 1 capsule (500 mg total) by mouth 3 (three) times daily. 21 capsule 0  . cholecalciferol (VITAMIN D) 1000 UNITS tablet Take 1,000 Units by mouth daily.    . clopidogrel (PLAVIX) 75 MG tablet Take 75 mg by mouth daily.    . cyanocobalamin 1000 MCG tablet Take 1,000 mcg by mouth daily.     Marland Kitchen levothyroxine (  SYNTHROID) 137 MCG tablet Take 137 mcg by mouth daily before breakfast.    . lisinopril (ZESTRIL) 5 MG tablet Take 1 tablet (5 mg total) by mouth daily. 30 tablet 9  . metoprolol tartrate (LOPRESSOR) 50 MG tablet Take 50 mg by mouth 2 (two) times daily.    . pantoprazole (PROTONIX) 40 MG tablet Take 40 mg by mouth daily.    . traMADol (ULTRAM) 50 MG tablet Take 1 tablet (50 mg total) by mouth every 8 (eight) hours as needed. 30 tablet 0  . warfarin (COUMADIN) 1 MG tablet TAKE 2-3 TABLETS BY MOUTH DAILY AS DIRECTED BY COUMADIN CLINIC (Patient taking differently: Take 1-2 mg by mouth See admin instructions. Take 2 mg by mouth everyday except Tuesday at 1800, on Tuesday take 1 mg at 1800) 240 tablet 1  . QUEtiapine (SEROQUEL) 25 MG tablet Take 1 tablet (25 mg total) by mouth at bedtime. (Patient not taking: Reported on 01/11/2020) 30 tablet 0   Current Facility-Administered Medications on File Prior to Visit  Medication Dose Route Frequency Provider Last Rate Last Admin  . denosumab (PROLIA) injection 60 mg  60 mg Subcutaneous  Q6 months [name], MD   60 mg at 11/29/19 0950    Allergies  Allergen Reactions  . Fosamax [Alendronate Sodium] Other (See Comments)    Unknown per daughter   Social History   Socioeconomic History  . Marital status: Married    Spouse name: Not on file  . Number of children: Not on file  . Years of education: Not on file  . Highest education level: Not on file  Occupational History  . Not on file  Tobacco Use  . Smoking status: Former Smoker    Quit date: 04/28/1995    Years since quitting: 24.7  . Smokeless tobacco: Never Used  Substance and Sexual Activity  . Alcohol use: No    Alcohol/week: 0.0 standard drinks  . Drug use: No  . Sexual activity: Not on file  Other Topics Concern  . Not on file  Social History Narrative  . Not on file   Social Determinants of Health   Financial Resource Strain:   . Difficulty of Paying Living Expenses: Not on file  Food Insecurity:   . Worried About Charity fundraiser in the Last Year: Not on file  . Ran Out of Food in the Last Year: Not on file  Transportation Needs:   . Lack of Transportation (Medical): Not on file  . Lack of Transportation (Non-Medical): Not on file  Physical Activity:   . Days of Exercise per Week: Not on file  . Minutes of Exercise per Session: Not on file  Stress:   . Feeling of Stress : Not on file  Social Connections:   . Frequency of Communication with Friends and Family: Not on file  . Frequency of Social Gatherings with Friends and Family: Not on file  . Attends Religious Services: Not on file  . Active Member of Clubs or Organizations: Not on file  . Attends Archivist Meetings: Not on file  . Marital Status: Not on file  Intimate Partner Violence:   . Fear of Current or Ex-Partner: Not on file  . Emotionally Abused: Not on file  . Physically Abused: Not on file  . Sexually Abused: Not on file     Review of Systems  All other systems reviewed and are negative.        Objective:   Physical Exam  Constitutional: She appears well-developed and well-nourished. No distress.  Eyes: Conjunctivae are normal.  Neck: No JVD present.  Cardiovascular: An irregularly irregular rhythm present. Tachycardia present.  Murmur heard. Pulmonary/Chest: Effort normal. No respiratory distress. She has no wheezes. She has no rhonchi.  Abdominal: Soft. Bowel sounds are normal. She exhibits no distension. There is no abdominal tenderness. There is no rebound and no guarding.  Musculoskeletal:        General: No edema.     Right lower leg: No edema.  Lymphadenopathy:    She has no cervical adenopathy.  Skin: She is not diaphoretic. No erythema.  Vitals reviewed.         Assessment & Plan:  Delirium due to another medical condition - Plan: Urinalysis, Routine w reflex microscopic  Tachycardia - Plan: EKG 12-Lead Constipation, urinary tract infection, dehydration  Patient's daughter states that ever since her open heart surgery she is become increasingly confused with increased sundowning. Her last urinalysis suggested a urinary tract infection. I believe that the strain from her surgery coupled with a possible urinary tract infection has exacerbated her dementia and contributed to her delirium. She is now becoming constipated and dehydrated. Based on her symptoms, I believe she may have a persistent urinary tract infection however I am unable to obtain a urinalysis today to evaluate. Therefore I have recommended that we treat the patient for possible persistent UTI with Bactrim double strength tablets twice daily for 7 days. Offered the patient and her daughter to place an IV and bolus her with IV fluids for her dehydration. The patient's daughter and the patient elected to go home and try to push oral fluids such as Gatorade and water to try to rehydrate her. I believe this will help her tachycardia. We will treat her constipation with MiraLAX and improving her hydration.  They can do the MiraLAX 1-2 times a day as needed for the constipation. Reassess over the next 24 to 48 hours. If worsening she needs to go to the hospital for dehydration, delirium, and possible persistent urinary tract infection. Patient's daughter is comfortable with this plan.

## 2020-01-12 ENCOUNTER — Inpatient Hospital Stay (HOSPITAL_COMMUNITY)
Admission: EM | Admit: 2020-01-12 | Discharge: 2020-01-18 | DRG: 812 | Disposition: A | Discharge status: Skilled Nursing Facility | Payer: Medicare Other | Attending: Internal Medicine | Admitting: Internal Medicine

## 2020-01-12 ENCOUNTER — Telehealth: Payer: Self-pay | Admitting: Family Medicine

## 2020-01-12 ENCOUNTER — Emergency Department (HOSPITAL_COMMUNITY): Payer: Medicare Other

## 2020-01-12 ENCOUNTER — Encounter (HOSPITAL_COMMUNITY): Payer: Self-pay | Admitting: Emergency Medicine

## 2020-01-12 ENCOUNTER — Other Ambulatory Visit: Payer: Self-pay

## 2020-01-12 DIAGNOSIS — Z743 Need for continuous supervision: Secondary | ICD-10-CM | POA: Diagnosis not present

## 2020-01-12 DIAGNOSIS — R627 Adult failure to thrive: Secondary | ICD-10-CM | POA: Diagnosis not present

## 2020-01-12 DIAGNOSIS — F0390 Unspecified dementia without behavioral disturbance: Secondary | ICD-10-CM | POA: Diagnosis present

## 2020-01-12 DIAGNOSIS — E860 Dehydration: Secondary | ICD-10-CM | POA: Diagnosis not present

## 2020-01-12 DIAGNOSIS — D62 Acute posthemorrhagic anemia: Principal | ICD-10-CM | POA: Diagnosis present

## 2020-01-12 DIAGNOSIS — I4891 Unspecified atrial fibrillation: Secondary | ICD-10-CM | POA: Diagnosis not present

## 2020-01-12 DIAGNOSIS — Z20822 Contact with and (suspected) exposure to covid-19: Secondary | ICD-10-CM | POA: Diagnosis not present

## 2020-01-12 DIAGNOSIS — M2550 Pain in unspecified joint: Secondary | ICD-10-CM | POA: Diagnosis not present

## 2020-01-12 DIAGNOSIS — Z96652 Presence of left artificial knee joint: Secondary | ICD-10-CM | POA: Diagnosis not present

## 2020-01-12 DIAGNOSIS — R531 Weakness: Secondary | ICD-10-CM | POA: Diagnosis not present

## 2020-01-12 DIAGNOSIS — Z7902 Long term (current) use of antithrombotics/antiplatelets: Secondary | ICD-10-CM

## 2020-01-12 DIAGNOSIS — D649 Anemia, unspecified: Secondary | ICD-10-CM

## 2020-01-12 DIAGNOSIS — E039 Hypothyroidism, unspecified: Secondary | ICD-10-CM | POA: Diagnosis present

## 2020-01-12 DIAGNOSIS — N179 Acute kidney failure, unspecified: Secondary | ICD-10-CM

## 2020-01-12 DIAGNOSIS — K5900 Constipation, unspecified: Secondary | ICD-10-CM | POA: Diagnosis not present

## 2020-01-12 DIAGNOSIS — G309 Alzheimer's disease, unspecified: Secondary | ICD-10-CM | POA: Diagnosis not present

## 2020-01-12 DIAGNOSIS — G9341 Metabolic encephalopathy: Secondary | ICD-10-CM | POA: Diagnosis not present

## 2020-01-12 DIAGNOSIS — I252 Old myocardial infarction: Secondary | ICD-10-CM | POA: Diagnosis not present

## 2020-01-12 DIAGNOSIS — K8020 Calculus of gallbladder without cholecystitis without obstruction: Secondary | ICD-10-CM | POA: Diagnosis not present

## 2020-01-12 DIAGNOSIS — R5381 Other malaise: Secondary | ICD-10-CM | POA: Diagnosis not present

## 2020-01-12 DIAGNOSIS — Z7901 Long term (current) use of anticoagulants: Secondary | ICD-10-CM | POA: Diagnosis not present

## 2020-01-12 DIAGNOSIS — I4819 Other persistent atrial fibrillation: Secondary | ICD-10-CM | POA: Diagnosis present

## 2020-01-12 DIAGNOSIS — Z87891 Personal history of nicotine dependence: Secondary | ICD-10-CM | POA: Diagnosis not present

## 2020-01-12 DIAGNOSIS — R0689 Other abnormalities of breathing: Secondary | ICD-10-CM | POA: Diagnosis not present

## 2020-01-12 DIAGNOSIS — Z79891 Long term (current) use of opiate analgesic: Secondary | ICD-10-CM

## 2020-01-12 DIAGNOSIS — A419 Sepsis, unspecified organism: Secondary | ICD-10-CM | POA: Diagnosis present

## 2020-01-12 DIAGNOSIS — I350 Nonrheumatic aortic (valve) stenosis: Secondary | ICD-10-CM | POA: Diagnosis not present

## 2020-01-12 DIAGNOSIS — N3000 Acute cystitis without hematuria: Secondary | ICD-10-CM | POA: Diagnosis present

## 2020-01-12 DIAGNOSIS — Z888 Allergy status to other drugs, medicaments and biological substances status: Secondary | ICD-10-CM

## 2020-01-12 DIAGNOSIS — Z87440 Personal history of urinary (tract) infections: Secondary | ICD-10-CM

## 2020-01-12 DIAGNOSIS — E538 Deficiency of other specified B group vitamins: Secondary | ICD-10-CM | POA: Diagnosis not present

## 2020-01-12 DIAGNOSIS — I2510 Atherosclerotic heart disease of native coronary artery without angina pectoris: Secondary | ICD-10-CM | POA: Diagnosis not present

## 2020-01-12 DIAGNOSIS — E785 Hyperlipidemia, unspecified: Secondary | ICD-10-CM | POA: Diagnosis present

## 2020-01-12 DIAGNOSIS — R000 Tachycardia, unspecified: Secondary | ICD-10-CM | POA: Diagnosis not present

## 2020-01-12 DIAGNOSIS — Z8249 Family history of ischemic heart disease and other diseases of the circulatory system: Secondary | ICD-10-CM | POA: Diagnosis not present

## 2020-01-12 DIAGNOSIS — E46 Unspecified protein-calorie malnutrition: Secondary | ICD-10-CM | POA: Diagnosis not present

## 2020-01-12 DIAGNOSIS — Z7401 Bed confinement status: Secondary | ICD-10-CM | POA: Diagnosis not present

## 2020-01-12 DIAGNOSIS — Z952 Presence of prosthetic heart valve: Secondary | ICD-10-CM

## 2020-01-12 DIAGNOSIS — R791 Abnormal coagulation profile: Secondary | ICD-10-CM | POA: Diagnosis present

## 2020-01-12 DIAGNOSIS — I10 Essential (primary) hypertension: Secondary | ICD-10-CM | POA: Diagnosis not present

## 2020-01-12 DIAGNOSIS — I959 Hypotension, unspecified: Secondary | ICD-10-CM | POA: Diagnosis not present

## 2020-01-12 DIAGNOSIS — K922 Gastrointestinal hemorrhage, unspecified: Secondary | ICD-10-CM | POA: Diagnosis not present

## 2020-01-12 DIAGNOSIS — Z79890 Hormone replacement therapy (postmenopausal): Secondary | ICD-10-CM

## 2020-01-12 DIAGNOSIS — N200 Calculus of kidney: Secondary | ICD-10-CM | POA: Diagnosis not present

## 2020-01-12 DIAGNOSIS — Z79899 Other long term (current) drug therapy: Secondary | ICD-10-CM

## 2020-01-12 DIAGNOSIS — R404 Transient alteration of awareness: Secondary | ICD-10-CM | POA: Diagnosis not present

## 2020-01-12 DIAGNOSIS — G4730 Sleep apnea, unspecified: Secondary | ICD-10-CM | POA: Diagnosis present

## 2020-01-12 DIAGNOSIS — I4821 Permanent atrial fibrillation: Secondary | ICD-10-CM | POA: Diagnosis not present

## 2020-01-12 DIAGNOSIS — Z823 Family history of stroke: Secondary | ICD-10-CM | POA: Diagnosis not present

## 2020-01-12 DIAGNOSIS — K2971 Gastritis, unspecified, with bleeding: Secondary | ICD-10-CM | POA: Diagnosis not present

## 2020-01-12 DIAGNOSIS — Z955 Presence of coronary angioplasty implant and graft: Secondary | ICD-10-CM | POA: Diagnosis not present

## 2020-01-12 DIAGNOSIS — M810 Age-related osteoporosis without current pathological fracture: Secondary | ICD-10-CM | POA: Diagnosis present

## 2020-01-12 LAB — POC OCCULT BLOOD, ED: Fecal Occult Bld: POSITIVE — AB

## 2020-01-12 LAB — URINALYSIS, ROUTINE W REFLEX MICROSCOPIC
Bilirubin Urine: NEGATIVE
Glucose, UA: NEGATIVE mg/dL
Hgb urine dipstick: NEGATIVE
Ketones, ur: NEGATIVE mg/dL
Leukocytes,Ua: NEGATIVE
Nitrite: NEGATIVE
Protein, ur: NEGATIVE mg/dL
Specific Gravity, Urine: 1.01 (ref 1.005–1.030)
pH: 5 (ref 5.0–8.0)

## 2020-01-12 LAB — PROTIME-INR
INR: 4.9 (ref 0.8–1.2)
Prothrombin Time: 45.8 seconds — ABNORMAL HIGH (ref 11.4–15.2)

## 2020-01-12 LAB — CBC WITH DIFFERENTIAL/PLATELET
Abs Immature Granulocytes: 0.08 10*3/uL — ABNORMAL HIGH (ref 0.00–0.07)
Basophils Absolute: 0 10*3/uL (ref 0.0–0.1)
Basophils Relative: 0 %
Eosinophils Absolute: 0 10*3/uL (ref 0.0–0.5)
Eosinophils Relative: 0 %
HCT: 27.9 % — ABNORMAL LOW (ref 36.0–46.0)
Hemoglobin: 8.9 g/dL — ABNORMAL LOW (ref 12.0–15.0)
Immature Granulocytes: 1 %
Lymphocytes Relative: 7 %
Lymphs Abs: 0.9 10*3/uL (ref 0.7–4.0)
MCH: 29 pg (ref 26.0–34.0)
MCHC: 31.9 g/dL (ref 30.0–36.0)
MCV: 90.9 fL (ref 80.0–100.0)
Monocytes Absolute: 0.9 10*3/uL (ref 0.1–1.0)
Monocytes Relative: 7 %
Neutro Abs: 11.2 10*3/uL — ABNORMAL HIGH (ref 1.7–7.7)
Neutrophils Relative %: 85 %
Platelets: 169 10*3/uL (ref 150–400)
RBC: 3.07 MIL/uL — ABNORMAL LOW (ref 3.87–5.11)
RDW: 15.8 % — ABNORMAL HIGH (ref 11.5–15.5)
WBC: 13.2 10*3/uL — ABNORMAL HIGH (ref 4.0–10.5)
nRBC: 0 % (ref 0.0–0.2)

## 2020-01-12 LAB — BASIC METABOLIC PANEL
Anion gap: 11 (ref 5–15)
BUN: 50 mg/dL — ABNORMAL HIGH (ref 8–23)
CO2: 23 mmol/L (ref 22–32)
Calcium: 8 mg/dL — ABNORMAL LOW (ref 8.9–10.3)
Chloride: 100 mmol/L (ref 98–111)
Creatinine, Ser: 2.65 mg/dL — ABNORMAL HIGH (ref 0.44–1.00)
GFR calc Af Amer: 18 mL/min — ABNORMAL LOW (ref >60)
GFR calc non Af Amer: 16 mL/min — ABNORMAL LOW (ref >60)
Glucose, Bld: 79 mg/dL (ref 70–99)
Potassium: 4 mmol/L (ref 3.5–5.1)
Sodium: 134 mmol/L — ABNORMAL LOW (ref 135–145)

## 2020-01-12 LAB — PROCALCITONIN: Procalcitonin: <0.1 ng/mL

## 2020-01-12 LAB — SARS CORONAVIRUS 2 (TAT 6-24 HRS): SARS Coronavirus 2: NEGATIVE

## 2020-01-12 LAB — LACTIC ACID, PLASMA: Lactic Acid, Venous: 1.8 mmol/L (ref 0.5–1.9)

## 2020-01-12 MED ORDER — WARFARIN - PHARMACIST DOSING INPATIENT: Freq: Every day | Status: DC

## 2020-01-12 MED ORDER — VITAMIN B-12 1000 MCG PO TABS
1000.0000 ug | ORAL_TABLET | Freq: Every day | ORAL | Status: DC
  Administered 2020-01-12 – 2020-01-18 (×7): 1000 ug via ORAL
  Filled 2020-01-12 (×7): qty 1

## 2020-01-12 MED ORDER — VITAMIN D 25 MCG (1000 UNIT) PO TABS
1000.0000 [IU] | ORAL_TABLET | Freq: Every day | ORAL | Status: DC
  Administered 2020-01-12 – 2020-01-18 (×7): 1000 [IU] via ORAL
  Filled 2020-01-12 (×7): qty 1

## 2020-01-12 MED ORDER — ALPRAZOLAM 0.5 MG PO TABS
0.5000 mg | ORAL_TABLET | Freq: Every evening | ORAL | Status: DC | PRN
  Administered 2020-01-13 – 2020-01-18 (×5): 0.5 mg via ORAL
  Filled 2020-01-12 (×5): qty 1

## 2020-01-12 MED ORDER — SODIUM CHLORIDE 0.9 % IV SOLN: INTRAVENOUS | Status: DC

## 2020-01-12 MED ORDER — ACETAMINOPHEN 325 MG PO TABS
650.0000 mg | ORAL_TABLET | Freq: Four times a day (QID) | ORAL | Status: DC | PRN
  Administered 2020-01-14 – 2020-01-17 (×5): 650 mg via ORAL
  Filled 2020-01-12 (×5): qty 2

## 2020-01-12 MED ORDER — CLOPIDOGREL BISULFATE 75 MG PO TABS
75.0000 mg | ORAL_TABLET | Freq: Every day | ORAL | Status: DC
  Administered 2020-01-12 – 2020-01-14 (×2): 75 mg via ORAL
  Filled 2020-01-12 (×3): qty 1

## 2020-01-12 MED ORDER — ACETAMINOPHEN 650 MG RE SUPP: 650.0000 mg | Freq: Four times a day (QID) | RECTAL | Status: DC | PRN

## 2020-01-12 MED ORDER — SODIUM CHLORIDE 0.9 % IV SOLN
1.0000 g | INTRAVENOUS | Status: DC
  Administered 2020-01-12 – 2020-01-13 (×2): 1 g via INTRAVENOUS
  Filled 2020-01-12: qty 1
  Filled 2020-01-12: qty 10
  Filled 2020-01-12: qty 1

## 2020-01-12 MED ORDER — URELLE 81 MG PO TABS
1.0000 | ORAL_TABLET | Freq: Three times a day (TID) | ORAL | Status: AC
  Administered 2020-01-12 – 2020-01-15 (×9): 81 mg via ORAL
  Filled 2020-01-12 (×9): qty 1

## 2020-01-12 MED ORDER — SODIUM CHLORIDE 0.9 % IV BOLUS
1000.0000 mL | Freq: Once | INTRAVENOUS | Status: AC
  Administered 2020-01-12: 1000 mL via INTRAVENOUS

## 2020-01-12 MED ORDER — PANTOPRAZOLE SODIUM 40 MG PO TBEC
40.0000 mg | DELAYED_RELEASE_TABLET | Freq: Every day | ORAL | Status: DC
  Administered 2020-01-12 – 2020-01-18 (×7): 40 mg via ORAL
  Filled 2020-01-12 (×7): qty 1

## 2020-01-12 MED ORDER — ONDANSETRON HCL 4 MG/2ML IJ SOLN: 4.0000 mg | Freq: Four times a day (QID) | INTRAMUSCULAR | Status: DC | PRN

## 2020-01-12 MED ORDER — LEVOTHYROXINE SODIUM 25 MCG PO TABS
137.0000 ug | ORAL_TABLET | Freq: Every day | ORAL | Status: DC
  Administered 2020-01-13 – 2020-01-18 (×6): 137 ug via ORAL
  Filled 2020-01-12 (×6): qty 1

## 2020-01-12 MED ORDER — ATORVASTATIN CALCIUM 40 MG PO TABS
80.0000 mg | ORAL_TABLET | Freq: Every day | ORAL | Status: DC
  Administered 2020-01-12 – 2020-01-18 (×7): 80 mg via ORAL
  Filled 2020-01-12 (×7): qty 2

## 2020-01-12 MED ORDER — ONDANSETRON HCL 4 MG PO TABS: 4.0000 mg | ORAL_TABLET | Freq: Four times a day (QID) | ORAL | Status: DC | PRN

## 2020-01-12 NOTE — ED Notes (Signed)
ED Provider at bedside. 
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
## 2020-01-12 NOTE — Progress Notes (Signed)
CRITICAL VALUE ALERT  Critical Value:  INR 4.9  Date & Time Notied:  01/11/20 @ [id number]  Provider Notified: Dr. Tana Coast  Orders Received/Actions taken: MD made aware via amion

## 2020-01-12 NOTE — ED Notes (Signed)
Patient transported to CT 

## 2020-01-12 NOTE — ED Provider Notes (Addendum)
[facility] DEPT Provider Note   CSN: [id number] Arrival date & time: 01/12/20  1035     History Chief Complaint  Patient presents with  . Failure To Thrive    [name] is a 86 y.o. female.  HPI Patient presents to the emergency department with weakness and failure to thrive at home.  The patient is unable to give me much history.  Patient does have dementia.  The daughter states that the patient has been getting weaker.  Patient was seen by her doctor yesterday and prescribed antibiotics for UTI.  Patient has been difficult for the daughter to take care of she states. Past Medical History:  Diagnosis Date  . Atrial fibrillation, persistent (HCC)    Atrial fibrillation  . CAD (coronary artery disease)    a. 01/2015 NSTEMI/PCI: LM nl, LAD 30p, LCX nondom, 30-40p, 50d, OM1 100p (2.75x16 Rebel BMS), RCA mild diff plaque, EF 55%.  . Cancer ([place])   . Candida infection, disseminated ([place])   . Dementia ([place])   . Hyperlipidemia   . Hypertension   . Hypothyroid   . Osteoporosis   . S/P TAVR (transcatheter aortic valve replacement)    s/p TAVR with 41mm Edwards S3U THV via the TF approach  . Severe aortic stenosis   . Sleep apnea 04/2012   Mild/ AHI 10/CPAP 7cm h2o w/2 L o2    Patient Active Problem List   Diagnosis Date Noted  . Sepsis ([place]) 01/12/2020  . AKI (acute kidney injury) ([place]) 01/12/2020  . Acute cystitis 01/12/2020  . Urinary retention 11/30/2019  . S/P TAVR (transcatheter aortic valve replacement)   . Severe aortic stenosis   . Dementia ([place]) 09/16/2018  . Hypothyroidism 02/21/2015  . Persistent atrial fibrillation ([place])   . CAD (coronary artery disease)   . Hyperlipidemia   . Essential hypertension   . Osteoporosis   . Sleep apnea 04/29/2012    Past Surgical History:  Procedure Laterality Date  . CARDIAC CATHETERIZATION  11/10/2019   RIGHT/LEFT HEART CATH AND CORONARY ANGIOGRAPHY   . CARDIOVERSION N/A 08/22/2015   Procedure: CARDIOVERSION;  Surgeon: Skeet Latch, MD;  Location: [facility];  Service: Cardiovascular;  Laterality: N/A;  . JOINT REPLACEMENT Left    knee  . KNEE SURGERY    . LEFT HEART CATHETERIZATION WITH CORONARY ANGIOGRAM N/A 02/20/2015   Procedure: LEFT HEART CATHETERIZATION WITH CORONARY ANGIOGRAM;  Surgeon: Jolaine Artist, MD;  Location: [facility] CATH LAB;  Service: Cardiovascular;  Laterality: N/A;  . PERCUTANEOUS CORONARY STENT INTERVENTION (PCI-S)  02/20/2015   Procedure: PERCUTANEOUS CORONARY STENT INTERVENTION (PCI-S);  Surgeon: Jolaine Artist, MD;  Location: [facility] CATH LAB;  Service: Cardiovascular;;  OM1  (2.75/53mm Rebel)  . RIGHT/LEFT HEART CATH AND CORONARY ANGIOGRAPHY N/A 11/10/2019   Procedure: RIGHT/LEFT HEART CATH AND CORONARY ANGIOGRAPHY;  Surgeon: Burnell Blanks, MD;  Location: [facility] CV LAB;  Service: Cardiovascular;  Laterality: N/A;  . TEE WITHOUT CARDIOVERSION N/A 11/30/2019   Procedure: TRANSESOPHAGEAL ECHOCARDIOGRAM (TEE);  Surgeon: Burnell Blanks, MD;  Location: [facility];  Service: Open Heart Surgery;  Laterality: N/A;  . TONSILLECTOMY    . TRANSCATHETER AORTIC VALVE REPLACEMENT, TRANSFEMORAL N/A 11/30/2019   Procedure: TRANSCATHETER AORTIC VALVE REPLACEMENT, TRANSFEMORAL;  Surgeon: Burnell Blanks, MD;  Location: [facility];  Service: Open Heart Surgery;  Laterality: N/A;     OB History   No obstetric history on file.     Family History  Problem Relation Age of Onset  .  Coronary artery disease Father   . Diabetes Father   . Stroke Mother   . Arthritis Mother     Social History   Tobacco Use  . Smoking status: Former Smoker    Quit date: 04/28/1995    Years since quitting: 24.7  . Smokeless tobacco: Never Used  Substance Use Topics  . Alcohol use: No    Alcohol/week: 0.0 standard drinks  . Drug use: No    Home Medications Prior to Admission medications   Medication Sig Start Date End Date Taking? Authorizing Provider    ALPRAZolam Duanne Moron) 0.5 MG tablet Take 1 tablet (0.5 mg total) by mouth at bedtime as needed for sleep. 12/13/19  Yes [name], MD  amLODipine (NORVASC) 5 MG tablet TAKE 1 TABLET (5 MG TOTAL) DAILY BY MOUTH. 08/09/19  Yes Hilty, Nadean Corwin, MD  atorvastatin (LIPITOR) 80 MG tablet Take 80 mg by mouth daily.   Yes [provider]  Calcium Citrate (CITRACAL PO) Take 1 tablet by mouth 2 (two) times daily.   Yes [provider]  cholecalciferol (VITAMIN D) 1000 UNITS tablet Take 1,000 Units by mouth daily.   Yes [provider]  clopidogrel (PLAVIX) 75 MG tablet Take 75 mg by mouth daily.   Yes [provider]  cyanocobalamin 1000 MCG tablet Take 1,000 mcg by mouth daily.    Yes [provider]  levothyroxine (SYNTHROID) 137 MCG tablet Take 137 mcg by mouth daily before breakfast.   Yes [provider]  lisinopril (ZESTRIL) 5 MG tablet Take 1 tablet (5 mg total) by mouth daily. 07/13/19  Yes Hilty, Nadean Corwin, MD  metoprolol tartrate (LOPRESSOR) 50 MG tablet Take 50 mg by mouth 2 (two) times daily.   Yes [provider]  pantoprazole (PROTONIX) 40 MG tablet Take 40 mg by mouth daily.   Yes [provider]  sulfamethoxazole-trimethoprim (BACTRIM DS) 800-160 MG tablet Take 1 tablet by mouth 2 (two) times daily. 01/11/20  Yes [name], MD  traMADol (ULTRAM) 50 MG tablet Take 1 tablet (50 mg total) by mouth every 8 (eight) hours as needed. 11/02/18  Yes [name], MD  warfarin (COUMADIN) 1 MG tablet TAKE 2-3 TABLETS BY MOUTH DAILY AS DIRECTED BY COUMADIN CLINIC Patient taking differently: Take 1-2 mg by mouth See admin instructions. Take 2 mg by mouth everyday except Tuesday at 1800, on Tuesday take 1 mg at 1800 10/25/19  Yes Hilty, Nadean Corwin, MD  cephALEXin (KEFLEX) 500 MG capsule Take 1 capsule (500 mg total) by mouth 3 (three) times daily. Patient not taking: Reported on 01/12/2020 01/05/20   [name], MD   QUEtiapine (SEROQUEL) 25 MG tablet Take 1 tablet (25 mg total) by mouth at bedtime. Patient not taking: Reported on 01/11/2020 01/04/20   [name], MD    Allergies    Fosamax [alendronate sodium]  Review of Systems   Review of Systems Level 5 caveat applies due to dementia Physical Exam Updated Vital Signs BP (!) 96/43   Pulse 76   Temp (!) 96.5 F (35.8 C) (Rectal)   Resp 17   SpO2 98%   Physical Exam Vitals and nursing note reviewed.  Constitutional:      General: She is not in acute distress.    Appearance: She is well-developed.  HENT:     Head: Normocephalic and atraumatic.  Eyes:     Pupils: Pupils are equal, round, and reactive to light.  Cardiovascular:  Rate and Rhythm: Normal rate and regular rhythm.     Heart sounds: Normal heart sounds. No murmur. No friction rub. No gallop.   Pulmonary:     Effort: Pulmonary effort is normal. No respiratory distress.     Breath sounds: Normal breath sounds. No wheezing.  Abdominal:     General: Bowel sounds are normal. There is no distension.     Palpations: Abdomen is soft.     Tenderness: There is no abdominal tenderness.  Musculoskeletal:     Cervical back: Normal range of motion and neck supple.  Skin:    General: Skin is warm and dry.     Capillary Refill: Capillary refill takes less than 2 seconds.     Findings: No erythema or rash.  Neurological:     Mental Status: She is alert. She is disoriented.     Motor: No abnormal muscle tone.     Coordination: Coordination normal.  Psychiatric:        Behavior: Behavior normal.     ED Results / Procedures / Treatments   Labs (all labs ordered are listed, but only abnormal results are displayed) Labs Reviewed  BASIC METABOLIC PANEL - Abnormal; Notable for the following components:      Result Value   Sodium 134 (*)    BUN 50 (*)    Creatinine, Ser 2.65 (*)    Calcium 8.0 (*)    GFR calc non Af Amer 16 (*)    GFR calc Af Amer 18 (*)    All other  components within normal limits  CBC WITH DIFFERENTIAL/PLATELET - Abnormal; Notable for the following components:   WBC 13.2 (*)    RBC 3.07 (*)    Hemoglobin 8.9 (*)    HCT 27.9 (*)    RDW 15.8 (*)    Neutro Abs 11.2 (*)    Abs Immature Granulocytes 0.08 (*)    All other components within normal limits  POC OCCULT BLOOD, ED - Abnormal; Notable for the following components:   Fecal Occult Bld POSITIVE (*)    All other components within normal limits  URINE CULTURE  CULTURE, BLOOD (SINGLE)  CULTURE, BLOOD (ROUTINE X 2)  CULTURE, BLOOD (ROUTINE X 2)  SARS CORONAVIRUS 2 (TAT 6-24 HRS)  URINALYSIS, ROUTINE W REFLEX MICROSCOPIC  PROCALCITONIN  LACTIC ACID, PLASMA  PROTIME-INR    EKG EKG Interpretation  Date/Time:  Wednesday January 12 2020 12:03:33 EST Ventricular Rate:  81 PR Interval:    QRS Duration: 171 QT Interval:  415 QTC Calculation: 482 R Axis:   33 Text Interpretation: Atrial fibrillation Ventricular premature complex IVCD, consider atypical LBBB Artifact in lead(s) I II III aVR aVL aVF V2 Confirmed by Gerlene Fee [phone]) on 01/12/2020 12:32:32 PM   Radiology CT ABDOMEN PELVIS WO CONTRAST  Result Date: 01/12/2020 CLINICAL DATA:  Acute generalized abdominal pain with neutropenia. EXAM: CT ABDOMEN AND PELVIS WITHOUT CONTRAST TECHNIQUE: Multidetector CT imaging of the abdomen and pelvis was performed following the standard protocol without IV contrast. COMPARISON:  11/16/2019. FINDINGS: Lower chest: Lung bases are clear. Atherosclerotic calcification of the aorta and coronary arteries. Aortic valve replacement. Heart is enlarged. No pericardial or pleural effusion. Distal esophagus is unremarkable. Hepatobiliary: Liver is unremarkable. A 1.6 cm stone is seen in the gallbladder. No biliary ductal dilatation. Pancreas: Negative. Spleen: Negative. Adrenals/Urinary Tract: Adrenal glands are unremarkable. Tiny stone in the lower pole right kidney. Probable renal sinus cysts on  the right. Subcentimeter low-attenuation lesions in the left kidney  are too small to characterize. Ureters are decompressed. Bladder is low in volume. Stomach/Bowel: Stomach, small bowel, appendix and colon are unremarkable. Vascular/Lymphatic: Atherosclerotic calcification of the aorta without abdominal aortic aneurysm. There is a new presumed pseudoaneurysm associated with the left common femoral artery, measuring 3.6 cm. No pathologically enlarged lymph nodes. Reproductive: Uterus is visualized.  No adnexal mass. Other: Umbilical hernia contains fat. Postoperative changes along the ventral abdominal wall. No free fluid. Mesenteries and peritoneum are otherwise unremarkable. Musculoskeletal: Advanced degenerative changes in the spine. No worrisome lytic or sclerotic lesions. IMPRESSION: 1. No acute findings to explain the patient's abdominal pain. 2. 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 Please correlate clinically. 3. Cholelithiasis. 4. Tiny right renal stone. 5. Aortic atherosclerosis (ICD10-I70.0). Coronary artery calcification. Electronically Signed   By: Lorin Picket M.D.   On: 01/12/2020 13:36   DG Chest Portable 1 View  Result Date: 01/12/2020 CLINICAL DATA:  Hypotension. EXAM: PORTABLE CHEST 1 VIEW COMPARISON:  Chest x-ray dated November 30, 2019. FINDINGS: Unchanged mild cardiomegaly status post TAVR. Normal pulmonary vascularity. No focal consolidation, pleural effusion, or pneumothorax. No acute osseous abnormality. IMPRESSION: No active disease. Electronically Signed   By: Titus Dubin M.D.   On: 01/12/2020 13:39    Procedures Procedures (including critical care time)  Medications Ordered in ED Medications  cefTRIAXone (ROCEPHIN) 1 g in sodium chloride 0.9 % 100 mL IVPB (has no administration in time range)  0.9 %  sodium chloride infusion (has no administration in time range)  sodium chloride  0.9 % bolus 1,000 mL (1,000 mLs Intravenous New Bag/Given (Non-Interop) 01/12/20 1151)    ED Course  I have reviewed the triage vital signs and the nursing notes.  Pertinent labs & imaging results that were available during my care of the patient were reviewed by me and considered in my medical decision making (see chart for details).    MDM Rules/Calculators/A&P                      Patient need admission to the hospital for her AKI and drop in hemoglobin.  Patient on rectal exam does not have any blood or gross blood noted on exam.  The patient's blood pressures do run in the low 100s it appears.  Final Clinical Impression(s) / ED Diagnoses Final diagnoses:  AKI (acute kidney injury) ([place])  Anemia, unspecified type    Rx / DC Orders ED Discharge Orders    None       Rebeca Allegra 01/12/20 1533    Maudie Flakes, MD 01/13/20 0809    Dalia Heading, PA-C 01/25/20 1555    Maudie Flakes, MD 01/26/20 [phone]
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
## 2020-01-12 NOTE — H&P (Signed)
History and Physical        Hospital Admission Note Date: 01/12/2020  Patient name: [name] Medical record number: [id number] Date of birth: September 01, 1933 Age: 86 y.o. Gender: female  PCP: Donita Brooks, MD    Patient coming from: Home  I have reviewed all records in the [facility].    Chief Complaint:  Confused, however reports dysuria  HPI: Patient is a 86 year old female with history of persistent A. fib on Coumadin, coronary disease, dementia, hypertension, hyperlipidemia, aortic stenosis, history of TAVR presented via EMS from home where she lives with her daughter.  Patient was seen by her PCP on 1/12 for increasing confusion and was started on Bactrim DS for 7 days.  Patient was also found to be somewhat dehydrated.  She was started on laxative for constipation and today had diarrhea.  Patient's daughter informed the EMS that she was not able to take care of the patient anymore and needs to be admitted to nursing facility. At the time of my encounter, patient is oriented to self and place, reports dysuria.  Otherwise she was able to not provide much history.  No reports of fevers, chills, nausea or vomiting. COVID-19 test pending  ED work-up/course:  Patient had reported abdominal discomfort.  CT abdomen showed no acute findings to explain the patient's abdominal pain.  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  Cholelithiasis Temp 97.7, respiratory rate 34, improved to 20.  Pulse 91 Initially BP 83/31, improved to 120/102 after IV fluids Sodium 134, BUN 50, creatinine 2.65.  Creatinine was 1.2 on 01/04/2020 UA and culture pending WBC is 13.2, hemoglobin 8.9, hematocrit 27.9 Chest x-ray showed no active disease  Review of Systems: Positives marked in 'bold' Difficult to obtain review of system from the  patient due to her mental status, dementia  Past Medical History: Past Medical History:  Diagnosis Date  . Atrial fibrillation, persistent (HCC)    Atrial fibrillation  . CAD (coronary artery disease)    a. 01/2015 NSTEMI/PCI: LM nl, LAD 30p, LCX nondom, 30-40p, 50d, OM1 100p (2.75x16 Rebel BMS), RCA mild diff plaque, EF 55%.  . Cancer (HCC)   . Candida infection, disseminated (HCC)   . Dementia (HCC)   . Hyperlipidemia   . Hypertension   . Hypothyroid   . Osteoporosis   . S/P TAVR (transcatheter aortic valve replacement)    s/p TAVR with 23mm Edwards S3U THV via the TF approach  . Severe aortic stenosis   . Sleep apnea 04/2012   Mild/ AHI 10/CPAP 7cm h2o w/2 L o2    Past Surgical History:  Procedure Laterality Date  . CARDIAC CATHETERIZATION  11/10/2019   RIGHT/LEFT HEART CATH AND CORONARY ANGIOGRAPHY   . CARDIOVERSION N/A 08/22/2015   Procedure: CARDIOVERSION;  Surgeon: Chilton Si, MD;  Location: [facility] ENDOSCOPY;  Service: Cardiovascular;  Laterality: N/A;  . JOINT REPLACEMENT Left    knee  . KNEE SURGERY    . LEFT HEART CATHETERIZATION WITH CORONARY ANGIOGRAM N/A 02/20/2015   Procedure: LEFT HEART CATHETERIZATION WITH CORONARY ANGIOGRAM;  Surgeon: Dolores Patty, MD;  Location: [facility] CATH LAB;  Service: Cardiovascular;  Laterality: N/A;  . PERCUTANEOUS CORONARY STENT INTERVENTION (PCI-S)  02/20/2015   Procedure: PERCUTANEOUS CORONARY STENT INTERVENTION (PCI-S);  Surgeon: Dolores Patty, MD;  Location: [facility] CATH LAB;  Service: Cardiovascular;;  OM1  (2.75/82mm Rebel)  . RIGHT/LEFT HEART CATH AND CORONARY ANGIOGRAPHY N/A 11/10/2019   Procedure: RIGHT/LEFT HEART CATH AND CORONARY ANGIOGRAPHY;  Surgeon: Kathleene Hazel, MD;  Location: [facility] INVASIVE CV LAB;  Service: Cardiovascular;  Laterality: N/A;  . TEE WITHOUT CARDIOVERSION N/A 11/30/2019   Procedure: TRANSESOPHAGEAL ECHOCARDIOGRAM (TEE);  Surgeon: Kathleene Hazel, MD;  Location: [facility] OR;  Service: Open Heart  Surgery;  Laterality: N/A;  . TONSILLECTOMY    . TRANSCATHETER AORTIC VALVE REPLACEMENT, TRANSFEMORAL N/A 11/30/2019   Procedure: TRANSCATHETER AORTIC VALVE REPLACEMENT, TRANSFEMORAL;  Surgeon: Kathleene Hazel, MD;  Location: [facility] OR;  Service: Open Heart Surgery;  Laterality: N/A;    Medications: Prior to Admission medications   Medication Sig Start Date End Date Taking? Authorizing Provider  ALPRAZolam Prudy Feeler) 0.5 MG tablet Take 1 tablet (0.5 mg total) by mouth at bedtime as needed for sleep. 12/13/19   Donita Brooks, MD  amLODipine (NORVASC) 5 MG tablet TAKE 1 TABLET (5 MG TOTAL) DAILY BY MOUTH. 08/09/19   Hilty, Lisette Abu, MD  atorvastatin (LIPITOR) 80 MG tablet Take 80 mg by mouth daily.    [provider]  Calcium Citrate (CITRACAL PO) Take 1 tablet by mouth 2 (two) times daily.    [provider]  cephALEXin (KEFLEX) 500 MG capsule Take 1 capsule (500 mg total) by mouth 3 (three) times daily. 01/05/20   Donita Brooks, MD  cholecalciferol (VITAMIN D) 1000 UNITS tablet Take 1,000 Units by mouth daily.    [provider]  clopidogrel (PLAVIX) 75 MG tablet Take 75 mg by mouth daily.    [provider]  cyanocobalamin 1000 MCG tablet Take 1,000 mcg by mouth daily.     [provider]  levothyroxine (SYNTHROID) 137 MCG tablet Take 137 mcg by mouth daily before breakfast.    [provider]  lisinopril (ZESTRIL) 5 MG tablet Take 1 tablet (5 mg total) by mouth daily. 07/13/19   Hilty, Lisette Abu, MD  metoprolol tartrate (LOPRESSOR) 50 MG tablet Take 50 mg by mouth 2 (two) times daily.    [provider]  pantoprazole (PROTONIX) 40 MG tablet Take 40 mg by mouth daily.    [provider]  QUEtiapine (SEROQUEL) 25 MG tablet Take 1 tablet (25 mg total) by mouth at bedtime. Patient not taking: Reported on 01/11/2020 01/04/20   Donita Brooks, MD  sulfamethoxazole-trimethoprim (BACTRIM DS) 800-160 MG tablet Take 1 tablet  by mouth 2 (two) times daily. 01/11/20   Donita Brooks, MD  traMADol (ULTRAM) 50 MG tablet Take 1 tablet (50 mg total) by mouth every 8 (eight) hours as needed. 11/02/18   Donita Brooks, MD  warfarin (COUMADIN) 1 MG tablet TAKE 2-3 TABLETS BY MOUTH DAILY AS DIRECTED BY COUMADIN CLINIC Patient taking differently: Take 1-2 mg by mouth See admin instructions. Take 2 mg by mouth everyday except Tuesday at 1800, on Tuesday take 1 mg at 1800 10/25/19   Hilty, Lisette Abu, MD    Allergies:   Allergies  Allergen Reactions  . Fosamax [Alendronate Sodium] Other (See Comments)    Unknown per daughter    Social History:  reports that she quit smoking about 24 years ago. She has never used smokeless tobacco. She reports that she does not drink alcohol  or use drugs.  Family History: Family History  Problem Relation Age of Onset  . Coronary artery disease Father   . Diabetes Father   . Stroke Mother   . Arthritis Mother     Physical Exam: Blood pressure (!) 96/43, pulse 76, temperature (!) 96.5 F (35.8 C), temperature source Rectal, resp. rate 17, SpO2 98 %. General: Alert, awake, oriented x2, to self and place, no acute distress. Eyes: pink conjunctiva,anicteric sclera, pupils equal and reactive to light and accomodation, HEENT: normocephalic, atraumatic, oropharynx clear Neck: supple, no masses or lymphadenopathy, no goiter, no bruits, no JVD CVS: Regular rate and rhythm, without murmurs, rubs or gallops. No lower extremity edema Resp : Clear to auscultation bilaterally, no wheezing, rales or rhonchi. GI : Soft, nontender, nondistended, positive bowel sounds, no masses. No hepatomegaly. Musculoskeletal: No clubbing or cyanosis, positive pedal pulses. No contracture. ROM intact  Neuro: Moving all 4 extremities Psych: Dementia, alert and oriented to self and place Skin: no rashes or lesions, warm and dry   LABS on Admission: I have personally reviewed all the labs and imagings below      Basic Metabolic Panel: Recent Labs  Lab 01/12/20 1200  NA 134*  K 4.0  CL 100  CO2 23  GLUCOSE 79  BUN 50*  CREATININE 2.65*  CALCIUM 8.0*   Liver Function Tests: No results for input(s): AST, ALT, ALKPHOS, BILITOT, PROT, ALBUMIN in the last 168 hours. No results for input(s): LIPASE, AMYLASE in the last 168 hours. No results for input(s): AMMONIA in the last 168 hours. CBC: Recent Labs  Lab 01/12/20 1200  WBC 13.2*  NEUTROABS 11.2*  HGB 8.9*  HCT 27.9*  MCV 90.9  PLT 169   Cardiac Enzymes: No results for input(s): CKTOTAL, CKMB, CKMBINDEX, TROPONINI in the last 168 hours. BNP: Invalid input(s): POCBNP CBG: No results for input(s): GLUCAP in the last 168 hours.  Radiological Exams on Admission:  CT ABDOMEN PELVIS WO CONTRAST  Result Date: 01/12/2020 CLINICAL DATA:  Acute generalized abdominal pain with neutropenia. EXAM: CT ABDOMEN AND PELVIS WITHOUT CONTRAST TECHNIQUE: Multidetector CT imaging of the abdomen and pelvis was performed following the standard protocol without IV contrast. COMPARISON:  11/16/2019. FINDINGS: Lower chest: Lung bases are clear. Atherosclerotic calcification of the aorta and coronary arteries. Aortic valve replacement. Heart is enlarged. No pericardial or pleural effusion. Distal esophagus is unremarkable. Hepatobiliary: Liver is unremarkable. A 1.6 cm stone is seen in the gallbladder. No biliary ductal dilatation. Pancreas: Negative. Spleen: Negative. Adrenals/Urinary Tract: Adrenal glands are unremarkable. Tiny stone in the lower pole right kidney. Probable renal sinus cysts on the right. Subcentimeter low-attenuation lesions in the left kidney are too small to characterize. Ureters are decompressed. Bladder is low in volume. Stomach/Bowel: Stomach, small bowel, appendix and colon are unremarkable. Vascular/Lymphatic: Atherosclerotic calcification of the aorta without abdominal aortic aneurysm. There is a new presumed pseudoaneurysm associated  with the left common femoral artery, measuring 3.6 cm. No pathologically enlarged lymph nodes. Reproductive: Uterus is visualized.  No adnexal mass. Other: Umbilical hernia contains fat. Postoperative changes along the ventral abdominal wall. No free fluid. Mesenteries and peritoneum are otherwise unremarkable. Musculoskeletal: Advanced degenerative changes in the spine. No worrisome lytic or sclerotic lesions. IMPRESSION: 1. No acute findings to explain the patient's abdominal pain. 2. 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 Please correlate clinically. 3. Cholelithiasis. 4. Tiny right renal stone. 5. Aortic atherosclerosis (ICD10-I70.0). Coronary artery calcification. Electronically  Signed   By: Leanna Battles M.D.   On: 01/12/2020 13:36   DG Chest Portable 1 View  Result Date: 01/12/2020 CLINICAL DATA:  Hypotension. EXAM: PORTABLE CHEST 1 VIEW COMPARISON:  Chest x-ray dated November 30, 2019. FINDINGS: Unchanged mild cardiomegaly status post TAVR. Normal pulmonary vascularity. No focal consolidation, pleural effusion, or pneumothorax. No acute osseous abnormality. IMPRESSION: No active disease. Electronically Signed   By: Obie Dredge M.D.   On: 01/12/2020 13:39      EKG: Independently reviewed.  Rate 81, atrial fibrillation   Assessment/Plan Principal Problem:   Sepsis (HCC) possibly due to acute cystitis -Presenting with acute kidney injury, leukocytosis, hypotension BP 83/31 at the time of admission, complaining of cystitis, dysuria.  -Procalcitonin, lactic acid UA and blood cultures still pending -Given patient's symptoms, start IV Rocephin after cultures obtained and symptomatic treatment with urelle  Active Problems:   AKI (acute kidney injury) (HCC) -Hold antihypertensives, lisinopril -Continue IV fluid hydration, follow urine culture    Hyperlipidemia -Continue Lipitor    Essential  hypertension -BP borderline hypotensive, continue IV fluid hydration -Hold metoprolol, lisinopril, amlodipine    Persistent atrial fibrillation (HCC) -Rate currently controlled, continue Coumadin per pharmacy    CAD (coronary artery disease) -Currently no chest pain, continue Plavix, statin    Hypothyroidism -Continue levothyroxine    Dementia (HCC) -Currently oriented to self and place, continue Xanax as needed -PT OT evaluation, daughter requesting nursing facility    Severe aortic stenosis  S/P TAVR (transcatheter aortic valve replacement)  DVT prophylaxis: Warfarin  CODE STATUS: Full CODE STATUS  Consults called: None  Family Communication: No family member at the bedside  Admission status:   The medical decision making on this patient was of high complexity and the patient is at high risk for clinical deterioration, therefore this is a level 3 admission.  Severity of Illness:     The appropriate patient status for this patient is INPATIENT. Inpatient status is judged to be reasonable and necessary in order to provide the required intensity of service to ensure the patient's safety. The patient's presenting symptoms, physical exam findings, and initial radiographic and laboratory data in the context of their chronic comorbidities is felt to place them at high risk for further clinical deterioration. Furthermore, it is not anticipated that the patient will be medically stable for discharge from the hospital within 2 midnights of admission. The following factors support the patient status of inpatient.   " The patient's presenting symptoms include confused, sepsis, hypotensive, acute kidney injury " The worrisome physical exam findings include cystitis " The initial radiographic and laboratory data are worrisome because of acute kidney injury, leukocytosis " The chronic co-morbidities include dementia, CAD, history of UTIs, debility   * I certify that at the point of  admission it is my clinical judgment that the patient will require inpatient hospital care spanning beyond 2 midnights from the point of admission due to high intensity of service, high risk for further deterioration and high frequency of surveillance required.*    Time Spent on Admission:      Citlally Captain M.D. [facility] 01/12/2020, 2:51 PM

## 2020-01-12 NOTE — ED Notes (Signed)
Stuck patient again for blood work, but unsuccessful.

## 2020-01-12 NOTE — Progress Notes (Signed)
ANTICOAGULATION CONSULT NOTE - Initial Consult  Pharmacy Consult for warfarin Indication: atrial fibrillation  Allergies  Allergen Reactions  . Fosamax [Alendronate Sodium] Other (See Comments)    Unknown per daughter    Patient Measurements:   Heparin Dosing Weight:   Vital Signs: Temp: 96.5 F (35.8 C) (01/13 1239) Temp Source: Rectal (01/13 1239) BP: 96/43 (01/13 1400) Pulse Rate: 76 (01/13 1215)  Labs: Recent Labs    01/12/20 1200  HGB 8.9*  HCT 27.9*  PLT 169  CREATININE 2.65*    Estimated Creatinine Clearance: 13.3 mL/min (A) (by C-G formula based on SCr of 2.65 mg/dL (H)).   Medical History: Past Medical History:  Diagnosis Date  . Atrial fibrillation, persistent (HCC)    Atrial fibrillation  . CAD (coronary artery disease)    a. 01/2015 NSTEMI/PCI: LM nl, LAD 30p, LCX nondom, 30-40p, 50d, OM1 100p (2.75x16 Rebel BMS), RCA mild diff plaque, EF 55%.  . Cancer ([place])   . Candida infection, disseminated ([place])   . Dementia ([place])   . Hyperlipidemia   . Hypertension   . Hypothyroid   . Osteoporosis   . S/P TAVR (transcatheter aortic valve replacement)    s/p TAVR with 11mm Edwards S3U THV via the TF approach  . Severe aortic stenosis   . Sleep apnea 04/2012   Mild/ AHI 10/CPAP 7cm h2o w/2 L o2    Assessment: 86 yo F on warfarin PTA for Afib.  Pharmacy to dose warfarin while in hospital.  Coumadin clinic pt: last visit 12/29/2019: INR 2.4. PTA dose 2 mg daily X 1 mg on Tuesdays. Last dose 1/12 at 6 pm Admission INR is above goal at 4.9. Hg 8.9, pltc 169.  No bleeding reported.   Goal of Therapy:  INR 2-3 Monitor platelets by anticoagulation protocol: Yes   Plan:  Hold coumadin today Daily INR  Eudelia Bunch, Pharm.D [phone] 01/12/2020 6:02 PM

## 2020-01-12 NOTE — ED Notes (Signed)
Was only able to get enough blood for BMP and CBC.
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
## 2020-01-12 NOTE — ED Notes (Signed)
Called main phlebotomy to come stick patient for lab work pending, since stuck three time by this RN and not able to get enough for all labs ordered.

## 2020-01-13 DIAGNOSIS — N179 Acute kidney failure, unspecified: Secondary | ICD-10-CM

## 2020-01-13 LAB — CBC
HCT: 26 % — ABNORMAL LOW (ref 36.0–46.0)
Hemoglobin: 8 g/dL — ABNORMAL LOW (ref 12.0–15.0)
MCH: 28.5 pg (ref 26.0–34.0)
MCHC: 30.8 g/dL (ref 30.0–36.0)
MCV: 92.5 fL (ref 80.0–100.0)
Platelets: 152 10*3/uL (ref 150–400)
RBC: 2.81 MIL/uL — ABNORMAL LOW (ref 3.87–5.11)
RDW: 15.9 % — ABNORMAL HIGH (ref 11.5–15.5)
WBC: 8.8 10*3/uL (ref 4.0–10.5)
nRBC: 0 % (ref 0.0–0.2)

## 2020-01-13 LAB — PROTIME-INR
INR: 5.1 (ref 0.8–1.2)
Prothrombin Time: 47.5 seconds — ABNORMAL HIGH (ref 11.4–15.2)

## 2020-01-13 LAB — BASIC METABOLIC PANEL
Anion gap: 12 (ref 5–15)
BUN: 45 mg/dL — ABNORMAL HIGH (ref 8–23)
CO2: 18 mmol/L — ABNORMAL LOW (ref 22–32)
Calcium: 7.7 mg/dL — ABNORMAL LOW (ref 8.9–10.3)
Chloride: 107 mmol/L (ref 98–111)
Creatinine, Ser: 2.33 mg/dL — ABNORMAL HIGH (ref 0.44–1.00)
GFR calc Af Amer: 22 mL/min — ABNORMAL LOW (ref >60)
GFR calc non Af Amer: 19 mL/min — ABNORMAL LOW (ref >60)
Glucose, Bld: 79 mg/dL (ref 70–99)
Potassium: 3.6 mmol/L (ref 3.5–5.1)
Sodium: 137 mmol/L (ref 135–145)

## 2020-01-13 LAB — URINE CULTURE: Culture: NO GROWTH

## 2020-01-13 MED ORDER — CHLORHEXIDINE GLUCONATE CLOTH 2 % EX PADS
6.0000 | MEDICATED_PAD | Freq: Every day | CUTANEOUS | Status: DC
  Administered 2020-01-13 – 2020-01-18 (×5): 6 via TOPICAL

## 2020-01-13 MED ORDER — PHYTONADIONE 5 MG PO TABS
5.0000 mg | ORAL_TABLET | Freq: Once | ORAL | Status: AC
  Administered 2020-01-13: 10:00:00 5 mg via ORAL
  Filled 2020-01-13: qty 1

## 2020-01-13 MED ORDER — METOPROLOL TARTRATE 50 MG PO TABS
50.0000 mg | ORAL_TABLET | Freq: Two times a day (BID) | ORAL | Status: DC
  Administered 2020-01-13 – 2020-01-18 (×10): 50 mg via ORAL
  Filled 2020-01-13 (×3): qty 1
  Filled 2020-01-13: qty 2
  Filled 2020-01-13 (×7): qty 1

## 2020-01-13 NOTE — Consult Note (Signed)
[facility] Consultation Note  Referring Provider: [facility] Primary Care Physician:  [name], MD  Reason for Consultation:  Anemia, hemoccult positive stools  HPI: [name] is a 86 y.o. female admitted from home with dysuria and urosepsis. We were called for worsening anemia and hemoccult positive stools.  Patient is awake and conversant, but unable to provide much meaningful history.  Patient denies abdominal pain or hematemesis or overt blood in stool.  Doesn't think she's ever had an endoscopy or colonoscopy.  Is confused:  Not aware of where she is, what month it is, what year it is, why she's in hospital.  On both clopidigrel and warfarin for atrial fibrillation and aortic stenosis s/p TAVR.   Past Medical History:  Diagnosis Date  . Atrial fibrillation, persistent (HCC)    Atrial fibrillation  . CAD (coronary artery disease)    a. 01/2015 NSTEMI/PCI: LM nl, LAD 30p, LCX nondom, 30-40p, 50d, OM1 100p (2.75x16 Rebel BMS), RCA mild diff plaque, EF 55%.  . Cancer ([place])   . Candida infection, disseminated ([place])   . Dementia ([place])   . Hyperlipidemia   . Hypertension   . Hypothyroid   . Osteoporosis   . S/P TAVR (transcatheter aortic valve replacement)    s/p TAVR with 29mm Edwards S3U THV via the TF approach  . Severe aortic stenosis   . Sleep apnea 04/2012   Mild/ AHI 10/CPAP 7cm h2o w/2 L o2    Past Surgical History:  Procedure Laterality Date  . CARDIAC CATHETERIZATION  11/10/2019   RIGHT/LEFT HEART CATH AND CORONARY ANGIOGRAPHY   . CARDIOVERSION N/A 08/22/2015   Procedure: CARDIOVERSION;  Surgeon: Skeet Latch, MD;  Location: [place];  Service: Cardiovascular;  Laterality: N/A;  . JOINT REPLACEMENT Left    knee  . KNEE SURGERY    . LEFT HEART CATHETERIZATION WITH CORONARY ANGIOGRAM N/A 02/20/2015   Procedure: LEFT HEART CATHETERIZATION WITH CORONARY ANGIOGRAM;  Surgeon: Jolaine Artist, MD;  Location: [facility] CATH LAB;  Service:  Cardiovascular;  Laterality: N/A;  . PERCUTANEOUS CORONARY STENT INTERVENTION (PCI-S)  02/20/2015   Procedure: PERCUTANEOUS CORONARY STENT INTERVENTION (PCI-S);  Surgeon: Jolaine Artist, MD;  Location: [facility] CATH LAB;  Service: Cardiovascular;;  OM1  (2.75/67mm Rebel)  . RIGHT/LEFT HEART CATH AND CORONARY ANGIOGRAPHY N/A 11/10/2019   Procedure: RIGHT/LEFT HEART CATH AND CORONARY ANGIOGRAPHY;  Surgeon: Burnell Blanks, MD;  Location: [place] CV LAB;  Service: Cardiovascular;  Laterality: N/A;  . TEE WITHOUT CARDIOVERSION N/A 11/30/2019   Procedure: TRANSESOPHAGEAL ECHOCARDIOGRAM (TEE);  Surgeon: Burnell Blanks, MD;  Location: [place];  Service: Open Heart Surgery;  Laterality: N/A;  . TONSILLECTOMY    . TRANSCATHETER AORTIC VALVE REPLACEMENT, TRANSFEMORAL N/A 11/30/2019   Procedure: TRANSCATHETER AORTIC VALVE REPLACEMENT, TRANSFEMORAL;  Surgeon: Burnell Blanks, MD;  Location: [place];  Service: Open Heart Surgery;  Laterality: N/A;    Prior to Admission medications   Medication Sig Start Date End Date Taking? Authorizing Provider  ALPRAZolam Duanne Moron) 0.5 MG tablet Take 1 tablet (0.5 mg total) by mouth at bedtime as needed for sleep. 12/13/19  Yes [name], MD  amLODipine (NORVASC) 5 MG tablet TAKE 1 TABLET (5 MG TOTAL) DAILY BY MOUTH. 08/09/19  Yes Hilty, Nadean Corwin, MD  atorvastatin (LIPITOR) 80 MG tablet Take 80 mg by mouth daily.   Yes [provider]  Calcium Citrate (CITRACAL PO) Take 1 tablet by mouth 2 (two) times daily.   Yes [provider]  cholecalciferol (VITAMIN D) 1000 UNITS tablet Take 1,000 Units by mouth daily.   Yes [provider]  clopidogrel (PLAVIX) 75 MG tablet Take 75 mg by mouth daily.   Yes [provider]  cyanocobalamin 1000 MCG tablet Take 1,000 mcg by mouth daily.    Yes [provider]  levothyroxine (SYNTHROID) 137 MCG tablet Take 137 mcg by mouth daily before breakfast.   Yes [provider]  lisinopril (ZESTRIL) 5 MG tablet Take 1 tablet (5 mg total) by mouth daily. 07/13/19  Yes Hilty, Nadean Corwin, MD  metoprolol tartrate (LOPRESSOR) 50 MG tablet Take 50 mg by mouth 2 (two) times daily.   Yes [provider]  pantoprazole (PROTONIX) 40 MG tablet Take 40 mg by mouth daily.   Yes [provider]  sulfamethoxazole-trimethoprim (BACTRIM DS) 800-160 MG tablet Take 1 tablet by mouth 2 (two) times daily. 01/11/20  Yes [name], MD  traMADol (ULTRAM) 50 MG tablet Take 1 tablet (50 mg total) by mouth every 8 (eight) hours as needed. 11/02/18  Yes [name], MD  warfarin (COUMADIN) 1 MG tablet TAKE 2-3 TABLETS BY MOUTH DAILY AS DIRECTED BY COUMADIN CLINIC Patient taking differently: Take 1-2 mg by mouth See admin instructions. Take 2 mg by mouth everyday except Tuesday at 1800, on Tuesday take 1 mg at 1800 10/25/19  Yes Hilty, Nadean Corwin, MD  cephALEXin (KEFLEX) 500 MG capsule Take 1 capsule (500 mg total) by mouth 3 (three) times daily. Patient not taking: Reported on 01/12/2020 01/05/20   [name], MD  QUEtiapine (SEROQUEL) 25 MG tablet Take 1 tablet (25 mg total) by mouth at bedtime. Patient not taking: Reported on 01/11/2020 01/04/20   [name], MD    Current Facility-Administered Medications  Medication Dose Route Frequency Provider Last Rate Last Admin  . 0.9 %  sodium chloride infusion   Intravenous Continuous Rai, Ripudeep K, MD 100 mL/hr at 01/12/20 1800 New Bag at 01/12/20 1800  . acetaminophen (TYLENOL) tablet 650 mg  650 mg Oral Q6H PRN Rai, Ripudeep K, MD       Or  . acetaminophen (TYLENOL) suppository 650 mg  650 mg Rectal Q6H PRN Rai, Ripudeep K, MD      . ALPRAZolam Duanne Moron) tablet 0.5 mg  0.5 mg Oral QHS PRN Rai, Ripudeep K, MD      . atorvastatin (LIPITOR) tablet 80 mg  80 mg Oral Daily Rai, Ripudeep K, MD   80 mg at 01/13/20 1014  . cefTRIAXone (ROCEPHIN) 1 g in sodium chloride 0.9 % 100 mL IVPB  1 g Intravenous Q24H  Rai, Ripudeep K, MD 200 mL/hr at 01/12/20 1858 1 g at 01/12/20 1858  . Chlorhexidine Gluconate Cloth 2 % PADS 6 each  6 each Topical Daily Rai, Ripudeep K, MD   6 each at 01/13/20 1016  . cholecalciferol (VITAMIN D3) tablet 1,000 Units  1,000 Units Oral Daily Rai, Ripudeep K, MD   1,000 Units at 01/13/20 1015  . clopidogrel (PLAVIX) tablet 75 mg  75 mg Oral Daily Rai, Ripudeep K, MD   75 mg at 01/12/20 1805  . levothyroxine (SYNTHROID) tablet 137 mcg  137 mcg Oral Q0600 Rai, Ripudeep Raliegh Ip, MD   137 mcg at 01/13/20 0559  . ondansetron (ZOFRAN) tablet 4 mg  4 mg Oral Q6H PRN Rai, Ripudeep K, MD       Or  . ondansetron (ZOFRAN) injection 4 mg  4 mg Intravenous Q6H PRN Rai, Ripudeep K,  MD      . pantoprazole (PROTONIX) EC tablet 40 mg  40 mg Oral Daily Rai, Ripudeep K, MD   40 mg at 01/13/20 1015  . Urelle (URELLE/URISED) 81 MG tablet 81 mg  1 tablet Oral TID Rai, Ripudeep K, MD   81 mg at 01/13/20 1015  . vitamin B-12 (CYANOCOBALAMIN) tablet 1,000 mcg  1,000 mcg Oral Daily Rai, Ripudeep K, MD   1,000 mcg at 01/13/20 1014  . Warfarin - Pharmacist Dosing Inpatient   Does not apply q1800 Eudelia Bunch, [facility]   Stopped at 01/12/20 1804    Allergies as of 01/12/2020 - Review Complete 01/12/2020  Allergen Reaction Noted  . Fosamax [alendronate sodium] Other (See Comments) 03/05/2013    Family History  Problem Relation Age of Onset  . Coronary artery disease Father   . Diabetes Father   . Stroke Mother   . Arthritis Mother     Social History   Socioeconomic History  . Marital status: Married    Spouse name: Not on file  . Number of children: Not on file  . Years of education: Not on file  . Highest education level: Not on file  Occupational History  . Not on file  Tobacco Use  . Smoking status: Former Smoker    Quit date: 04/28/1995    Years since quitting: 24.7  . Smokeless tobacco: Never Used  Substance and Sexual Activity  . Alcohol use: No    Alcohol/week: 0.0 standard drinks  .  Drug use: No  . Sexual activity: Not on file  Other Topics Concern  . Not on file  Social History Narrative  . Not on file   Social Determinants of Health   Financial Resource Strain:   . Difficulty of Paying Living Expenses: Not on file  Food Insecurity:   . Worried About Charity fundraiser in the Last Year: Not on file  . Ran Out of Food in the Last Year: Not on file  Transportation Needs:   . Lack of Transportation (Medical): Not on file  . Lack of Transportation (Non-Medical): Not on file  Physical Activity:   . Days of Exercise per Week: Not on file  . Minutes of Exercise per Session: Not on file  Stress:   . Feeling of Stress : Not on file  Social Connections:   . Frequency of Communication with Friends and Family: Not on file  . Frequency of Social Gatherings with Friends and Family: Not on file  . Attends Religious Services: Not on file  . Active Member of Clubs or Organizations: Not on file  . Attends Archivist Meetings: Not on file  . Marital Status: Not on file  Intimate Partner Violence:   . Fear of Current or Ex-Partner: Not on file  . Emotionally Abused: Not on file  . Physically Abused: Not on file  . Sexually Abused: Not on file    Review of Systems: Patient is confused:  Unable to obtain  Physical Exam: Vital signs in last 24 hours: Temp:  [96.5 F (35.8 C)-97.8 F (36.6 C)] 97.8 F (36.6 C) (01/14 0459) Pulse Rate:  [76-100] 100 (01/14 0459) Resp:  [12-31] 16 (01/14 0459) BP: (88-124)/(36-102) 124/67 (01/14 0459) SpO2:  [98 %-100 %] 100 % (01/14 0459) Last BM Date: 01/11/20 General:   Alert,  Conversant but confused, pleasant but somewhat agitated but in NAD Head:  Normocephalic and atraumatic. Eyes:  Sclera clear, no icterus.   Conjunctiva pale Ears:  Normal auditory acuity. Nose:  No deformity, discharge,  or lesions. Mouth:  No deformity or lesions.  Oropharynx pale and dry Neck:  Supple; no masses or thyromegaly. Abdomen:   Soft, nontender and nondistended. No masses, hepatosplenomegaly or hernias noted. Normal bowel sounds, without guarding, and without rebound.     Msk:  Symmetrical without gross deformities. Normal posture. Pulses:  Normal pulses noted. Extremities:  Without clubbing or edema. Neurologic:  Demented and confused; no focal motor deficits Skin:  Scattered ecchymoses, otherwise intact without significant lesions or rashes. Psych:  Alert, conversant but confused; agitated but not combative   Lab Results: Recent Labs    01/12/20 1200 01/13/20 0411  WBC 13.2* 8.8  HGB 8.9* 8.0*  HCT 27.9* 26.0*  PLT 169 152   BMET Recent Labs    01/12/20 1200 01/13/20 0411  NA 134* 137  K 4.0 3.6  CL 100 107  CO2 23 18*  GLUCOSE 79 79  BUN 50* 45*  CREATININE 2.65* 2.33*  CALCIUM 8.0* 7.7*   LFT No results for input(s): PROT, ALBUMIN, AST, ALT, ALKPHOS, BILITOT, BILIDIR, IBILI in the last 72 hours. PT/INR Recent Labs    01/12/20 1617 01/13/20 0411  LABPROT 45.8* 47.5*  INR 4.9* 5.1*    Studies/Results: CT ABDOMEN PELVIS WO CONTRAST  Result Date: 01/12/2020 CLINICAL DATA:  Acute generalized abdominal pain with neutropenia. EXAM: CT ABDOMEN AND PELVIS WITHOUT CONTRAST TECHNIQUE: Multidetector CT imaging of the abdomen and pelvis was performed following the standard protocol without IV contrast. COMPARISON:  11/16/2019. FINDINGS: Lower chest: Lung bases are clear. Atherosclerotic calcification of the aorta and coronary arteries. Aortic valve replacement. Heart is enlarged. No pericardial or pleural effusion. Distal esophagus is unremarkable. Hepatobiliary: Liver is unremarkable. A 1.6 cm stone is seen in the gallbladder. No biliary ductal dilatation. Pancreas: Negative. Spleen: Negative. Adrenals/Urinary Tract: Adrenal glands are unremarkable. Tiny stone in the lower pole right kidney. Probable renal sinus cysts on the right. Subcentimeter low-attenuation lesions in the left kidney are too small  to characterize. Ureters are decompressed. Bladder is low in volume. Stomach/Bowel: Stomach, small bowel, appendix and colon are unremarkable. Vascular/Lymphatic: Atherosclerotic calcification of the aorta without abdominal aortic aneurysm. There is a new presumed pseudoaneurysm associated with the left common femoral artery, measuring 3.6 cm. No pathologically enlarged lymph nodes. Reproductive: Uterus is visualized.  No adnexal mass. Other: Umbilical hernia contains fat. Postoperative changes along the ventral abdominal wall. No free fluid. Mesenteries and peritoneum are otherwise unremarkable. Musculoskeletal: Advanced degenerative changes in the spine. No worrisome lytic or sclerotic lesions. IMPRESSION: 1. No acute findings to explain the patient's abdominal pain. 2. 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 Please correlate clinically. 3. Cholelithiasis. 4. Tiny right renal stone. 5. Aortic atherosclerosis (ICD10-I70.0). Coronary artery calcification. Electronically Signed   By: Lorin Picket M.D.   On: 01/12/2020 13:36   DG Chest Portable 1 View  Result Date: 01/12/2020 CLINICAL DATA:  Hypotension. EXAM: PORTABLE CHEST 1 VIEW COMPARISON:  Chest x-ray dated November 30, 2019. FINDINGS: Unchanged mild cardiomegaly status post TAVR. Normal pulmonary vascularity. No focal consolidation, pleural effusion, or pneumothorax. No acute osseous abnormality. IMPRESSION: No active disease. Electronically Signed   By: Titus Dubin M.D.   On: 01/12/2020 13:39    Impression:  1.  Acute on chronic anemia.  Likely multifactorial:  Renal dysfunction, hydration related changes.  Patient could certainly have some diffuse GI related losses in setting of coagulopathy,  but she has no overt bleeding (heme positive stools are fairly meaningless in patient with INR 5).  CT scan showed no retroperitoneal hematoma or any obvious luminal GI  tract solid lesions. 2.  Urinary tract infection, suspected urosepsis. 3.  Dementia with acute on chronic confusion. 4.  Severe aortic stenosis with TAVR.  Atrial fibrillation. 5.  Chronic anticoagulation, warfarin and clopidigrel, INR 5.  Plan:  1.  Treatment of UTI/urosepsis. 2.  Follow CBCs, transfuse as needed. 3.  Gradual correction of INR favored, given patient's lack of overt GI hemorrhage. 4.  Patient is very poor candidate for endoscopy or colonoscopy, now or ever, given her age, dementia with now acutely altered mental status, and multiple cardiac comorbidities, in absence of overt life-threatening GI tract hemorrhage. 5.  So long as patient remains on anticoagulation, would pursue indefinite PPI prophylaxis (pantoprazole 40 mg po qd, or equivalent).   6.  Question at this point is when or if warfarin should be resumed (patient remains on clopidigrel); this risk:benefit analysis is best left to cardiology and hospitalist specialists; but if risk of cessation of warfarin is felt to be too high from cardiac/neurologic perspective, then I would recommend stricter INR intervals (2-3) versus use of only newer oral anticoagulant/antiplatelet agents (if feasible) where concerns with therapeutic range are less cogent. 6.  [facility] will sign-off; please call with questions; thank you for the consultation.   LOS: 1 day   Shardae Kleinman M  01/13/2020, 11:58 AM  Cell [phone] If no answer or after 5 PM call [phone]

## 2020-01-13 NOTE — Progress Notes (Signed)
Pt became aggitated and anxious with HR sustaining 110s-120s. MD notified. PRN xanax given at 1722.   Pt remains anxious with HR sustaining in the 120s-130s. Pt states that she is having bladder pain and "wants to go home". MD Adhikari paged. See new orders.

## 2020-01-13 NOTE — Progress Notes (Signed)
[place] for warfarin Indication: atrial fibrillation  Allergies  Allergen Reactions  . Fosamax [Alendronate Sodium] Other (See Comments)    Unknown per daughter    Patient Measurements: Height: 5\' 2"  (157.5 cm) IBW/kg (Calculated) : 50.1   Vital Signs: Temp: 97.8 F (36.6 C) (01/14 0459) Temp Source: Oral (01/14 0459) BP: 124/67 (01/14 0459) Pulse Rate: 100 (01/14 0459)  Labs: Recent Labs    01/12/20 1200 01/12/20 1617 01/13/20 0411  HGB 8.9*  --  8.0*  HCT 27.9*  --  26.0*  PLT 169  --  152  LABPROT  --  45.8* 47.5*  INR  --  4.9* 5.1*  CREATININE 2.65*  --   --     Estimated Creatinine Clearance: 15 mL/min (A) (by C-G formula based on SCr of 2.65 mg/dL (H)).   Medical History: Past Medical History:  Diagnosis Date  . Atrial fibrillation, persistent (HCC)    Atrial fibrillation  . CAD (coronary artery disease)    a. 01/2015 NSTEMI/PCI: LM nl, LAD 30p, LCX nondom, 30-40p, 50d, OM1 100p (2.75x16 Rebel BMS), RCA mild diff plaque, EF 55%.  . Cancer ([place])   . Candida infection, disseminated ([place])   . Dementia ([place])   . Hyperlipidemia   . Hypertension   . Hypothyroid   . Osteoporosis   . S/P TAVR (transcatheter aortic valve replacement)    s/p TAVR with 40mm Edwards S3U THV via the TF approach  . Severe aortic stenosis   . Sleep apnea 04/2012   Mild/ AHI 10/CPAP 7cm h2o w/2 L o2    Assessment: 86 y/o F on warfarin PTA for atrial fibrillation. Pharmacy consulted to dose warfarin while in hospital.  Per anti-coag clinic notes (last visit 12/29/2019), PTA dose 2mg  daily except 1mg  on Tuesdays. Per PTA medication list, last dose 01/11/2020 at 1800. Admission INR elevated at 4.9. Fecal occult blood positive.   Today, 01/13/20:  INR = 5.1, supratherapeutic   CBC: Hgb decreased to 8, Pltc WNL  Goal of Therapy:  INR 2-3 Monitor platelets by anticoagulation protocol: Yes   Plan:   No warfarin today  Vitamin K 5mg  PO  x 1 ordered per MD to reverse INR  Daily PT/INR   Lindell Spar, PharmD, BCPS Clinical Pharmacist 01/13/2020 9:04 AM

## 2020-01-13 NOTE — Progress Notes (Signed)
CRITICAL VALUE ALERT  Critical Value:  INR 5.1  Date & Time Notied:  01/13/20 0740  Provider Notified: Tawanna Solo   Orders Received/Actions taken: No new orders at this time.

## 2020-01-13 NOTE — Progress Notes (Signed)
PROGRESS NOTE    [name]  [name] DOB: 02/04/1933 DOA: 01/12/2020 PCP: [name], MD   Brief Narrative:  Patient is 86 year old female with history of permanent A. fib on Coumadin, coronary artery disease on Plavix, dementia, hypertension, hyperlipidemia, aortic stenosis, history of TAVR who presented from home with complaints of confusion, dehydration.  She lives with her daughter and her daughter feels that she is not able to take care of her anymore.She was also complaining of abdominal discomfort on presentation.  CT abdomen/pelvis did not show any acute findings.  She was hypotensive on presentation with improved after IV fluids.  Found to have acute kidney injury, anemia with hemoglobin in the range of 8, leukocytosis.  Chest x-ray did not show any acute process.  Started on antibiotics for possible UTI.  Culture sent.  GI consulted today for anemia with Hemoccult stool.  Assessment & Plan:   Principal Problem:   Sepsis ([place]) Active Problems:   Hyperlipidemia   Essential hypertension   Persistent atrial fibrillation (HCC)   CAD (coronary artery disease)   Hypothyroidism   Dementia (HCC)   Severe aortic stenosis   S/P TAVR (transcatheter aortic valve replacement)   AKI (acute kidney injury) ([place])   Acute cystitis    Suspected sepsis secondary to urinary tract infection: Presented with AKI, leukocytosis.  Hypertensive on presentation.  Complained of dysuria, abdominal discomfort.  Procalcitonin, lactic acid normal.  UA was not that suggestive of UTI.  Blood cultures, urine culture sent.  Started on ceftriaxone.  I will continue antibiotic for today. She also has mild leukocytosis.  Acute blood loss normocytic anemia: Her hemoglobin was in the range of 12 on 01/04/2020.  Presented with hemoglobin in the range of 8.  FOBT positive.  INR elevated on presentation.  She was on warfarin and Plavix at home.  GI consulted.  Not planning for EGD or colonoscopy due to  her mental status, advanced age.  Recommended Protonix.  Continue to monitor CBC.  No active bleeding at present  Elevated INR: INR supratherapeutic in the range of 5 today.  We will give a dose of vitamin K 5 mg once because of Hemoccult positive stool.  Check INR tomorrow.  AKI : Baseline creatinine is normal.Presented with AKI.  Continue IV fluids.  Check BMP tomorrow.  Permanent A. fib: Currently rate is controlled.  On Coumadin at home.  Continue to monitor INR.  Difficult situation regarding her continuation of Coumadin due to GI bleed.  Coronary artery disease: No chest pain at present.  On Plavix, statin at home.  Most likely will discontinue Plavix permanently.  Continue statin  Hyperlipidemia: On Lipitor.  Hypothyroidism: Levothyroxine  History of severe aortic stenosis: Status post TAVR.  Advanced dementia: Oriented to self only.  Confused at baseline.  PT/OT evaluation ordered.    Hypertension: On hypertensive on arrival.  She was on metoprolol, lisinopril, amlodipine at home which are on hold.        DVT prophylaxis:SCD Code Status: Full code Family Communication: called daughter for update on 01/13/20 Disposition Plan: Likely skilled nursing facility.  Pending PT/OT evaluation   Consultants: GI  Procedures: None  Antimicrobials:  Anti-infectives (From admission, onward)   Start     Dose/Rate Route Frequency Ordered Stop   01/12/20 1700  Urelle (URELLE/URISED) 81 MG tablet 81 mg     1 tablet Oral 3 times daily 01/12/20 1649 01/15/20 1559   01/12/20 1445  cefTRIAXone (ROCEPHIN) 1 g in sodium chloride 0.9 %  100 mL IVPB     1 g 200 mL/hr over 30 Minutes Intravenous Every 24 hours 01/12/20 1440        Subjective:  Patient seen and examined at the bedside this morning.  Hemodynamically stable during my evaluation.  Appears comfortable.  Confused.  Not in any distress.  Denies any complaints.  Objective: Vitals:   01/12/20 1545 01/12/20 1600 01/12/20 2040  01/13/20 0459  BP: 114/65 (!) 98/51 (!) 111/46 124/67  Pulse:  87 79 100  Resp: 12 16 16 16   Temp:  97.7 F (36.5 C) (!) 97.4 F (36.3 C) 97.8 F (36.6 C)  TempSrc:  Oral Oral Oral  SpO2:  100% 100% 100%  Height:  5\' 2"  (1.575 m)      Intake/Output Summary (Last 24 hours) at 01/13/2020 1218 Last data filed at 01/13/2020 0500 Gross per 24 hour  Intake 1401.76 ml  Output 900 ml  Net 501.76 ml   There were no vitals filed for this visit.  Examination:  General exam: Appears calm and comfortable ,Not in distress, pleasantly confused, obese HEENT:PERRL,Oral mucosa moist, Ear/Nose normal on gross exam Respiratory system: Bilateral equal air entry, normal vesicular breath sounds, no wheezes or crackles  Cardiovascular system: Aifb. No JVD, murmurs, rubs, gallops or clicks. No pedal edema. Gastrointestinal system: Abdomen is nondistended, soft and nontender. No organomegaly or masses felt. Normal bowel sounds heard. Central nervous system: Alert ,awake but not oriented. No focal neurological deficits. Extremities: No edema, no clubbing ,no cyanosis, distal peripheral pulses palpable. Skin: No rashes, lesions or ulcers,no icterus ,no pallor Psychiatry: Judgement and insight appear impaired   Data Reviewed: I have personally reviewed following labs and imaging studies  CBC: Recent Labs  Lab 01/12/20 1200 01/13/20 0411  WBC 13.2* 8.8  NEUTROABS 11.2*  --   HGB 8.9* 8.0*  HCT 27.9* 26.0*  MCV 90.9 92.5  PLT 169 0000000   Basic Metabolic Panel: Recent Labs  Lab 01/12/20 1200 01/13/20 0411  NA 134* 137  K 4.0 3.6  CL 100 107  CO2 23 18*  GLUCOSE 79 79  BUN 50* 45*  CREATININE 2.65* 2.33*  CALCIUM 8.0* 7.7*   GFR: Estimated Creatinine Clearance: 17.1 mL/min (A) (by C-G formula based on SCr of 2.33 mg/dL (H)). Liver Function Tests: No results for input(s): AST, ALT, ALKPHOS, BILITOT, PROT, ALBUMIN in the last 168 hours. No results for input(s): LIPASE, AMYLASE in the last  168 hours. No results for input(s): AMMONIA in the last 168 hours. Coagulation Profile: Recent Labs  Lab 01/12/20 1617 01/13/20 0411  INR 4.9* 5.1*   Cardiac Enzymes: No results for input(s): CKTOTAL, CKMB, CKMBINDEX, TROPONINI in the last 168 hours. BNP (last 3 results) No results for input(s): PROBNP in the last 8760 hours. HbA1C: No results for input(s): HGBA1C in the last 72 hours. CBG: No results for input(s): GLUCAP in the last 168 hours. Lipid Profile: No results for input(s): CHOL, HDL, LDLCALC, TRIG, CHOLHDL, LDLDIRECT in the last 72 hours. Thyroid Function Tests: No results for input(s): TSH, T4TOTAL, FREET4, T3FREE, THYROIDAB in the last 72 hours. Anemia Panel: No results for input(s): VITAMINB12, FOLATE, FERRITIN, TIBC, IRON, RETICCTPCT in the last 72 hours. Sepsis Labs: Recent Labs  Lab 01/12/20 1617  PROCALCITON <0.10  LATICACIDVEN 1.8    Recent Results (from the past 240 hour(s))  SARS CORONAVIRUS 2 (TAT 6-24 HRS) Nasopharyngeal Nasopharyngeal Swab     Status: None   Collection Time: 01/12/20  2:13 PM   Specimen:  Nasopharyngeal Swab  Result Value Ref Range Status   SARS Coronavirus 2 NEGATIVE NEGATIVE Final    Comment: (NOTE) SARS-CoV-2 target nucleic acids are NOT DETECTED. The SARS-CoV-2 RNA is generally detectable in upper and lower respiratory specimens during the acute phase of infection. Negative results do not preclude SARS-CoV-2 infection, do not rule out co-infections with other pathogens, and should not be used as the sole basis for treatment or other patient management decisions. Negative results must be combined with clinical observations, patient history, and epidemiological information. The expected result is Negative. Fact Sheet for Patients: SugarRoll.be Fact Sheet for Healthcare Providers: https://www.woods-mathews.com/ This test is not yet approved or cleared by the [place] FDA and  has  been authorized for detection and/or diagnosis of SARS-CoV-2 by FDA under an Emergency Use Authorization (EUA). This EUA will remain  in effect (meaning this test can be used) for the duration of the COVID-19 declaration under Section 56 4(b)(1) of the Act, 21 U.S.C. section 360bbb-3(b)(1), unless the authorization is terminated or revoked sooner. Performed at [facility], [place] [address] [place], [place] [address]   Culture, blood (routine x 2)     Status: None (Preliminary result)   Collection Time: 01/12/20  4:17 PM   Specimen: BLOOD LEFT HAND  Result Value Ref Range Status   Specimen Description   Final    BLOOD LEFT HAND Performed at [facility] [address] [place], [place] [address]    Special Requests   Final    BOTTLES DRAWN AEROBIC AND ANAEROBIC Blood Culture adequate volume Performed at [facility] [address] [place], [place] [address]    Culture   Final    NO GROWTH < 24 HOURS Performed at [facility] [address], [place], [place] [address]    Report Status PENDING  Incomplete  Culture, blood (routine x 2)     Status: None (Preliminary result)   Collection Time: 01/12/20  4:17 PM   Specimen: BLOOD  Result Value Ref Range Status   Specimen Description   Final    BLOOD LEFT ANTECUBITAL Performed at [facility] [address] [place], [place] [address]    Special Requests   Final    BOTTLES DRAWN AEROBIC AND ANAEROBIC Blood Culture adequate volume Performed at [facility] [address], [place], [place] [address]    Culture   Final    NO GROWTH < 24 HOURS Performed at [facility] [address], [place], [place] [address]    Report Status PENDING  Incomplete  Blood Culture x 1     Status: None (Preliminary result)   Collection Time: 01/12/20  5:28 PM   Specimen: BLOOD LEFT ARM  Result Value Ref Range Status   Specimen Description   Final     BLOOD LEFT ARM Performed at [facility] [address], [place], [place] [address]    Special Requests   Final    BOTTLES DRAWN AEROBIC AND ANAEROBIC Blood Culture adequate volume Performed at [facility] [address], [place], [place] [address]    Culture   Final    NO GROWTH < 24 HOURS Performed at [facility] [address], [place], [place] [address]    Report Status PENDING  Incomplete         Radiology Studies: CT ABDOMEN PELVIS WO CONTRAST  Result Date: 01/12/2020 CLINICAL DATA:  Acute  generalized abdominal pain with neutropenia. EXAM: CT ABDOMEN AND PELVIS WITHOUT CONTRAST TECHNIQUE: Multidetector CT imaging of the abdomen and pelvis was performed following the standard protocol without IV contrast. COMPARISON:  11/16/2019. FINDINGS: Lower chest: Lung bases are clear. Atherosclerotic calcification of the aorta and coronary arteries. Aortic valve replacement. Heart is enlarged. No pericardial or pleural effusion. Distal esophagus is unremarkable. Hepatobiliary: Liver is unremarkable. A 1.6 cm stone is seen in the gallbladder. No biliary ductal dilatation. Pancreas: Negative. Spleen: Negative. Adrenals/Urinary Tract: Adrenal glands are unremarkable. Tiny stone in the lower pole right kidney. Probable renal sinus cysts on the right. Subcentimeter low-attenuation lesions in the left kidney are too small to characterize. Ureters are decompressed. Bladder is low in volume. Stomach/Bowel: Stomach, small bowel, appendix and colon are unremarkable. Vascular/Lymphatic: Atherosclerotic calcification of the aorta without abdominal aortic aneurysm. There is a new presumed pseudoaneurysm associated with the left common femoral artery, measuring 3.6 cm. No pathologically enlarged lymph nodes. Reproductive: Uterus is visualized.  No adnexal mass. Other: Umbilical hernia contains fat. Postoperative changes along the ventral abdominal wall. No free  fluid. Mesenteries and peritoneum are otherwise unremarkable. Musculoskeletal: Advanced degenerative changes in the spine. No worrisome lytic or sclerotic lesions. IMPRESSION: 1. No acute findings to explain the patient's abdominal pain. 2. 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 Please correlate clinically. 3. Cholelithiasis. 4. Tiny right renal stone. 5. Aortic atherosclerosis (ICD10-I70.0). Coronary artery calcification. Electronically Signed   By: Lorin Picket M.D.   On: 01/12/2020 13:36   DG Chest Portable 1 View  Result Date: 01/12/2020 CLINICAL DATA:  Hypotension. EXAM: PORTABLE CHEST 1 VIEW COMPARISON:  Chest x-ray dated November 30, 2019. FINDINGS: Unchanged mild cardiomegaly status post TAVR. Normal pulmonary vascularity. No focal consolidation, pleural effusion, or pneumothorax. No acute osseous abnormality. IMPRESSION: No active disease. Electronically Signed   By: Titus Dubin M.D.   On: 01/12/2020 13:39        Scheduled Meds: . atorvastatin  80 mg Oral Daily  . Chlorhexidine Gluconate Cloth  6 each Topical Daily  . cholecalciferol  1,000 Units Oral Daily  . clopidogrel  75 mg Oral Daily  . levothyroxine  137 mcg Oral Q0600  . pantoprazole  40 mg Oral Daily  . Urelle  1 tablet Oral TID  . cyanocobalamin  1,000 mcg Oral Daily  . Warfarin - Pharmacist Dosing Inpatient   Does not apply q1800   Continuous Infusions: . sodium chloride 100 mL/hr at 01/12/20 1800  . cefTRIAXone (ROCEPHIN)  IV 1 g (01/12/20 1858)     LOS: 1 day    Time spent: 35 mins.More than 50% of that time was spent in counseling and/or coordination of care.      Shelly Coss, MD [facility] Pager [phone]  If 7PM-7AM, please contact night-coverage [email or web address] Password [facility] 01/13/2020, 12:18 PM

## 2020-01-13 NOTE — Evaluation (Signed)
Occupational Therapy Evaluation Patient Details Name: [name] MRN: [id number] DOB: 1933/08/22 Today's Date: 01/13/2020    History of Present Illness Patient is 86 year old female with history of permanent A. fib on Coumadin, coronary artery disease on Plavix, dementia, hypertension, hyperlipidemia, aortic stenosis, history of TAVR who presented from home with complaints of confusion, dehydration.  She lives with her daughter and her daughter feels that she is not able to take care of her anymore.She was also complaining of abdominal discomfort on presentation.  CT abdomen/pelvis did not show any acute findings.  She was hypotensive on presentation with improved after IV fluids.  Found to have acute kidney injury, anemia with hemoglobin in the range of 8, leukocytosis.  Chest x-ray did not show any acute process.  Started on antibiotics for possible UTI.  Culture sent.  GI consulted today for anemia with Hemoccult stool.   Clinical Impression   Pt admitted with sepsis. Pt currently with functional limitations due to the deficits listed below (see OT Problem List).  Pt will benefit from skilled OT to increase their safety and independence with ADL and functional mobility for ADL to facilitate discharge to venue listed below.      Follow Up Recommendations  SNF    Equipment Recommendations  None recommended by OT    Recommendations for Other Services       Precautions / Restrictions Precautions Precautions: Fall      Mobility Bed Mobility Overal bed mobility: Needs Assistance Bed Mobility: Supine to Sit;Sit to Supine     Supine to sit: Mod assist Sit to supine: Max assist   General bed mobility comments: max A getting legs back up on bed  Transfers Overall transfer level: Needs assistance Equipment used: Rolling walker (2 wheeled);1 person hand held assist Transfers: Sit to/from Stand Sit to Stand: Max assist         General transfer comment: Pt stood EOB brielly  but sat VERY quickly.  Spoke with RN regarding 2 person A if pt gets to chair    Balance Overall balance assessment: Needs assistance Sitting-balance support: Single extremity supported Sitting balance-Leahy Scale: Fair     Standing balance support: Bilateral upper extremity supported Standing balance-Leahy Scale: Zero                             ADL either performed or assessed with clinical judgement   ADL Overall ADL's : Needs assistance/impaired Eating/Feeding: Minimal assistance;Sitting   Grooming: Minimal assistance;Sitting   Upper Body Bathing: Minimal assistance   Lower Body Bathing: Maximal assistance;Total assistance;Sit to/from stand;Cueing for safety   Upper Body Dressing : Minimal assistance;Sitting   Lower Body Dressing: Total assistance;+2 for safety/equipment;+2 for physical assistance;Cueing for safety                 General ADL Comments: Pt agreed to sitting EOB with OT and grooming. Pt pleasantly confused. Pt stood briefly EOB briefly and sat very quickly     Vision Patient Visual Report: No change from baseline              Pertinent Vitals/Pain Pain Assessment: Faces Faces Pain Scale: No hurt     Hand Dominance     Extremity/Trunk Assessment Upper Extremity Assessment Upper Extremity Assessment: Generalized weakness           Communication Communication Communication: No difficulties   Cognition Arousal/Alertness: Awake/alert Behavior During Therapy: WFL for tasks assessed/performed Overall Cognitive Status: Impaired/Different  from baseline                                 General Comments: pt confused as to where she is , reason she is here and situation as a whole.   General Comments               Home Living Family/patient expects to be discharged to:: Private residence Living Arrangements: Children Available Help at Discharge: Family;Friend(s) Type of Home: House       Home Layout: One  level     Bathroom Shower/Tub: Teacher, early years/pre: [place]: Environmental consultant - 2 wheels          Prior Functioning/Environment Level of Independence: Needs assistance        Comments: family will A as needed        OT Problem List: Decreased strength;Decreased activity tolerance;Impaired balance (sitting and/or standing);Decreased safety awareness;Decreased knowledge of use of DME or AE;Obesity;Decreased cognition      OT Treatment/Interventions: Self-care/ADL training;Patient/family education;Therapeutic activities;DME and/or AE instruction    OT Goals(Current goals can be found in the care plan section) Acute Rehab OT Goals Patient Stated Goal: did not state OT Goal Formulation: With patient Time For Goal Achievement: 01/20/20 Potential to Achieve Goals: Good  OT Frequency: Min 2X/week    AM-PAC OT "6 Clicks" Daily Activity     Outcome Measure Help from another person eating meals?: A Little Help from another person taking care of personal grooming?: A Little   Help from another person bathing (including washing, rinsing, drying)?: Total Help from another person to put on and taking off regular upper body clothing?: A Lot Help from another person to put on and taking off regular lower body clothing?: Total 6 Click Score: 10   End of Session Equipment Utilized During Treatment: Rolling walker;Gait belt Nurse Communication: Mobility status;Precautions  Activity Tolerance: Patient limited by fatigue Patient left: in bed  OT Visit Diagnosis: Unsteadiness on feet (R26.81);Other abnormalities of gait and mobility (R26.89);Muscle weakness (generalized) (M62.81)                Time: [id number] OT Time Calculation (min): 20 min Charges:  OT General Charges $OT Visit: 1 Visit OT Evaluation $OT Eval Moderate Complexity: 1 Mod  Kari Baars, [place] [phone] Office- [phone], Edwena Felty  D 01/13/2020, 5:39 PM

## 2020-01-13 NOTE — Plan of Care (Signed)
  Problem: Pain Managment: Goal: General experience of comfort will improve Outcome: Progressing   Problem: Safety: Goal: Ability to remain free from injury will improve Outcome: Progressing   Problem: Skin Integrity: Goal: Risk for impaired skin integrity will decrease Outcome: Progressing   

## 2020-01-14 LAB — CBC
HCT: 28.1 % — ABNORMAL LOW (ref 36.0–46.0)
Hemoglobin: 8.6 g/dL — ABNORMAL LOW (ref 12.0–15.0)
MCH: 29.1 pg (ref 26.0–34.0)
MCHC: 30.6 g/dL (ref 30.0–36.0)
MCV: 94.9 fL (ref 80.0–100.0)
Platelets: 160 10*3/uL (ref 150–400)
RBC: 2.96 MIL/uL — ABNORMAL LOW (ref 3.87–5.11)
RDW: 16 % — ABNORMAL HIGH (ref 11.5–15.5)
WBC: 8.3 10*3/uL (ref 4.0–10.5)
nRBC: 0 % (ref 0.0–0.2)

## 2020-01-14 LAB — BASIC METABOLIC PANEL
Anion gap: 10 (ref 5–15)
BUN: 29 mg/dL — ABNORMAL HIGH (ref 8–23)
CO2: 20 mmol/L — ABNORMAL LOW (ref 22–32)
Calcium: 7.8 mg/dL — ABNORMAL LOW (ref 8.9–10.3)
Chloride: 111 mmol/L (ref 98–111)
Creatinine, Ser: 1.38 mg/dL — ABNORMAL HIGH (ref 0.44–1.00)
GFR calc Af Amer: 41 mL/min — ABNORMAL LOW (ref >60)
GFR calc non Af Amer: 35 mL/min — ABNORMAL LOW (ref >60)
Glucose, Bld: 91 mg/dL (ref 70–99)
Potassium: 4 mmol/L (ref 3.5–5.1)
Sodium: 141 mmol/L (ref 135–145)

## 2020-01-14 LAB — URINE CULTURE: Culture: NO GROWTH

## 2020-01-14 LAB — PROTIME-INR
INR: 3.1 — ABNORMAL HIGH (ref 0.8–1.2)
Prothrombin Time: 31.8 seconds — ABNORMAL HIGH (ref 11.4–15.2)

## 2020-01-14 MED ORDER — SODIUM CHLORIDE 0.9 % IV SOLN: INTRAVENOUS | Status: AC

## 2020-01-14 MED ORDER — HALOPERIDOL LACTATE 5 MG/ML IJ SOLN
2.5000 mg | Freq: Once | INTRAMUSCULAR | Status: AC
  Administered 2020-01-14: 2.5 mg via INTRAVENOUS
  Filled 2020-01-14: qty 1

## 2020-01-14 NOTE — Progress Notes (Signed)
Received call back from MD. Entering order for sitter.

## 2020-01-14 NOTE — Evaluation (Signed)
Physical Therapy Evaluation Patient Details Name: [name] MRN: [id number] DOB: 1933/06/06 Today's Date: 01/14/2020   History of Present Illness  Patient is 86 year old female with history of permanent A. fib on Coumadin, coronary artery disease on Plavix, dementia, hypertension, hyperlipidemia, aortic stenosis, history of TAVR who presented from home with complaints of confusion, dehydration.  She lives with her daughter and her daughter feels that she is not able to take care of her anymore.She was also complaining of abdominal discomfort on presentation.  CT abdomen/pelvis did not show any acute findings.  She was hypotensive on presentation with improved after IV fluids.  Found to have acute kidney injury, anemia with hemoglobin in the range of 8, leukocytosis.  Chest x-ray did not show any acute process.  Started on antibiotics for possible UTI.  Culture sent.  GI consulted today for anemia with Hemoccult stool.  Clinical Impression  Pt admitted with above diagnosis.  Pt currently with functional limitations due to the deficits listed below (see PT Problem List). Pt will benefit from skilled PT to increase their independence and safety with mobility to allow discharge to the venue listed below.  Pt with decreased safety awareness and is impulsive.  She needs +2 for safety at this time. Recommend SNF.     Follow Up Recommendations SNF;Supervision/Assistance - 24 hour    Equipment Recommendations  None recommended by PT    Recommendations for Other Services       Precautions / Restrictions Precautions Precautions: Fall Restrictions Weight Bearing Restrictions: No      Mobility  Bed Mobility Overal bed mobility: Needs Assistance Bed Mobility: Supine to Sit;Sit to Supine     Supine to sit: Min assist;+2 for physical assistance;+2 for safety/equipment     General bed mobility comments: A for trunk and some to get to EOB, pt giving forth good effort  Transfers Overall  transfer level: Needs assistance Equipment used: Rolling walker (2 wheeled) Transfers: Sit to/from Stand Sit to Stand: Min assist; Mod assist; +2 safety/equipment;+2 physical assistance         General transfer comment: pt pulling on RW and not responding to cues for hand placement and moving quickly and impulsevily.  Ambulation/Gait Ambulation/Gait assistance: Min assist; Mod assist;+2 safety/equipment;+2 physical assistance Gait Distance (Feet): 5 Feet Assistive device: Rolling walker (2 wheeled) Gait Pattern/deviations: Decreased step length - right;Decreased step length - left;Wide base of support     General Gait Details: Weakness noted in L stance phase with some increased knee flexion, but no buckeling. Pt with decreased safety awareness  Stairs            Wheelchair Mobility    Modified Rankin (Stroke Patients Only)       Balance Overall balance assessment: Needs assistance   Sitting balance-Leahy Scale: Fair     Standing balance support: Bilateral upper extremity supported Standing balance-Leahy Scale: Poor Standing balance comment: Requires UE support                             Pertinent Vitals/Pain Pain Assessment: Faces Faces Pain Scale: No hurt    Home Living Family/patient expects to be discharged to:: Private residence Living Arrangements: Children Available Help at Discharge: Family;Friend(s) Type of Home: House       Home Layout: One level Home Equipment: Walker - 2 wheels      Prior Function Level of Independence: Needs assistance         Comments:  family A as needed     Hand Dominance        Extremity/Trunk Assessment   Upper Extremity Assessment Upper Extremity Assessment: Defer to OT evaluation    Lower Extremity Assessment Lower Extremity Assessment: Generalized weakness;LLE deficits/detail LLE Deficits / Details: L foot deformity with decreased ankle dorsiflexion       Communication    Communication: No difficulties  Cognition Arousal/Alertness: Awake/alert Behavior During Therapy: WFL for tasks assessed/performed Overall Cognitive Status: No family/caregiver present to determine baseline cognitive functioning Area of Impairment: Orientation;Memory;Attention;Problem solving;Safety/judgement;Awareness                 Orientation Level: Disoriented to;Place;Time;Situation Current Attention Level: Focused Memory: Decreased recall of precautions;Decreased short-term memory   Safety/Judgement: Decreased awareness of safety;Decreased awareness of deficits Awareness: Intellectual Problem Solving: Requires verbal cues;Requires tactile cues General Comments: Pt unaware she is in a hospital, continued to think different family members were present although no family there.      General Comments      Exercises     Assessment/Plan    PT Assessment Patient needs continued PT services  PT Problem List Decreased strength;Decreased balance;Decreased mobility;Decreased cognition;Decreased knowledge of use of DME;Decreased safety awareness       PT Treatment Interventions DME instruction;Gait training;Functional mobility training;Balance training;Therapeutic exercise;Therapeutic activities    PT Goals (Current goals can be found in the Care Plan section)  Acute Rehab PT Goals Patient Stated Goal: did not state PT Goal Formulation: Patient unable to participate in goal setting Time For Goal Achievement: 01/28/20 Potential to Achieve Goals: Fair    Frequency Min 2X/week   Barriers to discharge        Co-evaluation               AM-PAC PT "6 Clicks" Mobility  Outcome Measure Help needed turning from your back to your side while in a flat bed without using bedrails?: A Little Help needed moving from lying on your back to sitting on the side of a flat bed without using bedrails?: A Little Help needed moving to and from a bed to a chair (including a  wheelchair)?: A Little Help needed standing up from a chair using your arms (e.g., wheelchair or bedside chair)?: A Little Help needed to walk in hospital room?: A Lot Help needed climbing 3-5 steps with a railing? : A Lot 6 Click Score: 16    End of Session Equipment Utilized During Treatment: Gait belt Activity Tolerance: Patient tolerated treatment well Patient left: in chair;with call bell/phone within reach;with chair alarm set Nurse Communication: Mobility status PT Visit Diagnosis: Unsteadiness on feet (R26.81);Other abnormalities of gait and mobility (R26.89)    Time: 6578-4696 PT Time Calculation (min) (ACUTE ONLY): 17 min   Charges:   PT Evaluation $PT Eval Moderate Complexity: 1 Mod          Bronnie Vasseur L. Katrinka Blazing, [place] Pager [phone] 01/14/2020   Enzo Montgomery 01/14/2020, 11:19 AM

## 2020-01-14 NOTE — TOC Progression Note (Signed)
Transition of Care [facility]) - Progression Note    Patient Details  Name: [name] MRN: [id number] Date of Birth: [birth year]  Transition of Care [facility]) CM/SW Contact  Miquela Costabile, Juliann Pulse, RN Phone Number: 01/14/2020, 5:04 PM  Clinical Narrative:  Alvis Lemmings for HHPT/OT/aide/csw only.     Expected Discharge Plan: [place] Barriers to Discharge: Continued Medical Work up  Expected Discharge Plan and Services Expected Discharge Plan: [place]   Discharge Planning Services: CM Consult   Living arrangements for the past 2 months: Single Family Home                                       Social Determinants of Health (SDOH) Interventions    Readmission Risk Interventions Readmission Risk Prevention Plan 12/01/2019  Post Dischage Appt Complete  Medication Screening Complete  Transportation Screening Complete  Some recent data might be hidden

## 2020-01-14 NOTE — TOC Initial Note (Signed)
Transition of Care [facility]) - Initial/Assessment Note    Patient Details  Name: [name] MRN: [id number] Date of Birth: January 08, 1933  Transition of Care [facility]) CM/SW Contact:    Dessa Phi, RN Phone Number: 01/14/2020, 4:31 PM  Clinical Narrative: Damaris Schooner to dtr [place] Capri will talk to her brother prior to deciding if agreeable to SNF or Home w/HHC.                  Expected Discharge Plan: Skilled Nursing Facility Barriers to Discharge: Continued Medical Work up   Patient Goals and CMS Choice   CMS Medicare.gov Compare Post Acute Care list provided to:: Patient Represenative (must comment) Choice offered to / list presented to : Adult Children  Expected Discharge Plan and Services Expected Discharge Plan: [place]   Discharge Planning Services: CM Consult   Living arrangements for the past 2 months: Single Family Home                                      Prior Living Arrangements/Services Living arrangements for the past 2 months: Single Family Home Lives with:: Self   Do you feel safe going back to the place where you live?: Yes      Need for Family Participation in Patient Care: No (Comment) Care giver support system in place?: Yes (comment)   Criminal Activity/Legal Involvement Pertinent to Current Situation/Hospitalization: No - Comment as needed  Activities of Daily Living Home Assistive Devices/Equipment: Dentures (specify type), Eyeglasses, Walker (specify type)(upper/lower dentures, front wheeled walker) ADL Screening (condition at time of admission) Patient's cognitive ability adequate to safely complete daily activities?: No Is the patient deaf or have difficulty hearing?: Yes(slight hoh) Does the patient have difficulty seeing, even when wearing glasses/contacts?: No Does the patient have difficulty concentrating, remembering, or making decisions?: Yes Patient able to express need for assistance with ADLs?: Yes Does the patient  have difficulty dressing or bathing?: Yes Independently performs ADLs?: No Communication: Independent Dressing (OT): Needs assistance Is this a change from baseline?: Pre-admission baseline Grooming: Needs assistance Is this a change from baseline?: Pre-admission baseline Feeding: Needs assistance Bathing: Needs assistance Is this a change from baseline?: Pre-admission baseline Toileting: Needs assistance Is this a change from baseline?: Pre-admission baseline In/Out Bed: Needs assistance Is this a change from baseline?: Pre-admission baseline Walks in Home: Needs assistance Is this a change from baseline?: Pre-admission baseline Does the patient have difficulty walking or climbing stairs?: Yes(secondary to weakness) Weakness of Legs: Both Weakness of Arms/Hands: None  Permission Sought/Granted Permission sought to share information with : Case Manager Permission granted to share information with : Yes, Verbal Permission Granted  Share Information with NAME: Gelene Mink [phone]           Emotional Assessment Appearance:: Appears stated age Attitude/Demeanor/Rapport: Gracious Affect (typically observed): Accepting Orientation: : Oriented to Self Alcohol / Substance Use: Not Applicable Psych Involvement: No (comment)  Admission diagnosis:  AKI (acute kidney injury) ([place]) [N17.9] Sepsis ([place]) [A41.9] Anemia, unspecified type [D64.9] Patient Active Problem List   Diagnosis Date Noted  . Sepsis ([place]) 01/12/2020  . AKI (acute kidney injury) ([place]) 01/12/2020  . Acute cystitis 01/12/2020  . Urinary retention 11/30/2019  . S/P TAVR (transcatheter aortic valve replacement)   . Severe aortic stenosis   . Dementia ([place]) 09/16/2018  . Hypothyroidism 02/21/2015  . Persistent atrial fibrillation ([place])   .  CAD (coronary artery disease)   . Hyperlipidemia   . Essential hypertension   . Osteoporosis   . Sleep apnea 04/29/2012   PCP:  [name], MD Pharmacy:    [id number] - [place], [place] [id number] [place] [id number] Phone: [phone] Fax: [phone]     Social Determinants of Health (SDOH) Interventions    Readmission Risk Interventions Readmission Risk Prevention Plan 12/01/2019  Post Dischage Appt Complete  Medication Screening Complete  Transportation Screening Complete  Some recent data might be hidden

## 2020-01-14 NOTE — Progress Notes (Signed)
Placed a call to MD to request an 1:1 sitter for pt. Pt continuously trying to get out bed. Pulling at lines and IV tubing. Pt gets agitated when redirected. Awaiting call back.

## 2020-01-14 NOTE — Progress Notes (Signed)
PROGRESS NOTE    [name]  [name] DOB: 1933-12-01 DOA: 01/12/2020 PCP: [name], MD   Brief Narrative:  Patient is 86 year old female with history of permanent A. fib on Coumadin, coronary artery disease on Plavix, dementia, hypertension, hyperlipidemia, aortic stenosis, history of TAVR who presented from home with complaints of confusion, dehydration.  She lives with her daughter and her daughter feels that she is not able to take care of her anymore.She was also complaining of abdominal discomfort on presentation.  CT abdomen/pelvis did not show any acute findings.  She was hypotensive on presentation with improved after IV fluids.  Found to have acute kidney injury, anemia with hemoglobin in the range of 8, leukocytosis.  Chest x-ray did not show any acute process.  Started on antibiotics for possible UTI but he stopped after urine culture did not show any growth.   GI consulted  for anemia with Hemoccult stool but due to her history of dementia, they are not doing any EGD or colonoscopy.  Recommended conservative management.  Hemoglobin has remained stable .  PT/OT recommended skilled nursing facility.  Assessment & Plan:   Principal Problem:   Sepsis ([place]) Active Problems:   Hyperlipidemia   Essential hypertension   Persistent atrial fibrillation (HCC)   CAD (coronary artery disease)   Hypothyroidism   Dementia (HCC)   Severe aortic stenosis   S/P TAVR (transcatheter aortic valve replacement)   AKI (acute kidney injury) ([place])   Acute cystitis    Suspected sepsis secondary to urinary tract infection:Ruled out. Presented with AKI, leukocytosis.  Hypertensive on presentation.  Complained of dysuria, abdominal discomfort on presentation.  Procalcitonin, lactic acid normal.  UA was not that suggestive of UTI.  Blood cultures, urine culture sent,No growth.  Antibiotics stopped.  Acute blood loss normocytic anemia/upper GI bleed: Her hemoglobin was in the range of  12 on 01/04/2020.  Presented with hemoglobin in the range of 8.  FOBT positive.  INR elevated on presentation.  She was on warfarin and Plavix at home.  GI consulted.  Not planning for EGD or colonoscopy due to her mental status, advanced age.  Recommended Protonix.  Continue to monitor CBC.Hb stable in the range of 8.  No active bleeding at present.  Continue Protonix  Elevated INR: INR supratherapeutic in the range of 5 . Give  a dose of vitamin K 5 mg once because of Hemoccult positive stool. INR in the range of 3 today  AKI : Baseline creatinine is normal.Presented with AKI.  Improving with IV fluids  Permanent A. fib: Currently rate is controlled.  On Coumadin at home.  Continue to monitor INR.  Difficult situation regarding her continuation of Coumadin due to GI bleed.  GI recommended either stopping or considering novel anticoagulants.  Most likely she will be put on Eliquis on discharge.  Will hold anticoagulation for a week.  She needs to follow-up with her cardiologist as an outpatient.  Coronary artery disease: No chest pain at present.  On Plavix, statin at home.  Most likely will discontinue Plavix permanently.  Continue statin  Hyperlipidemia: On Lipitor.  Hypothyroidism: Levothyroxine  History of severe aortic stenosis: Status post TAVR.  Advanced dementia: Oriented to self only.  Confused at baseline.  PT/OT evaluation done and recommended skilled nursing facility  Hypertension: She was  Hypotensive on arrival.  She was on metoprolol, lisinopril, amlodipine at home .  Metoprolol restarted.        DVT prophylaxis:SCD Code Status: Full code  Family Communication: called daughter for update on 01/13/20 Disposition Plan: SNF .  She will be ready for discharge likely tomorrow.  Consultants: GI  Procedures: None  Antimicrobials:  Anti-infectives (From admission, onward)   Start     Dose/Rate Route Frequency Ordered Stop   01/12/20 1700  Urelle (URELLE/URISED) 81 MG tablet 81  mg     1 tablet Oral 3 times daily 01/12/20 1649 01/15/20 1559   01/12/20 1445  cefTRIAXone (ROCEPHIN) 1 g in sodium chloride 0.9 % 100 mL IVPB     1 g 200 mL/hr over 30 Minutes Intravenous Every 24 hours 01/12/20 1440        Subjective:  Patient seen and examined at the bedside this morning.  Hemodynamically stable.  Remains confused.  No active issues.  Objective: Vitals:   01/13/20 2141 01/14/20 0158 01/14/20 0443 01/14/20 0910  BP: (!) 122/55 90/73 (!) 110/46 (!) 112/57  Pulse: (!) 108 77 77 93  Resp: 18 20 18    Temp: 97.6 F (36.4 C) 98.5 F (36.9 C) (!) 97.3 F (36.3 C)   TempSrc: Oral Oral Oral   SpO2: 93% 92% 96%   Weight:      Height:        Intake/Output Summary (Last 24 hours) at 01/14/2020 1241 Last data filed at 01/14/2020 0447 Gross per 24 hour  Intake 1077.94 ml  Output 1175 ml  Net -97.06 ml   Filed Weights   01/13/20 1515  Weight: 75.2 kg    Examination:  General exam: Appears calm and comfortable ,Not in distress, pleasantly confused, obese HEENT:PERRL,Oral mucosa moist, Ear/Nose normal on gross exam Respiratory system: Bilateral equal air entry, normal vesicular breath sounds, no wheezes or crackles  Cardiovascular system: Aifb. No JVD, murmurs, rubs, gallops or clicks. No pedal edema. Gastrointestinal system: Abdomen is nondistended, soft and nontender. No organomegaly or masses felt. Normal bowel sounds heard. Central nervous system: Alert ,awake but not oriented. No focal neurological deficits. Extremities: No edema, no clubbing ,no cyanosis, distal peripheral pulses palpable. Skin: No rashes, lesions or ulcers,no icterus ,no pallor Psychiatry: Judgement and insight appear impaired   Data Reviewed: I have personally reviewed following labs and imaging studies  CBC: Recent Labs  Lab 01/12/20 1200 01/13/20 0411 01/14/20 0427  WBC 13.2* 8.8 8.3  NEUTROABS 11.2*  --   --   HGB 8.9* 8.0* 8.6*  HCT 27.9* 26.0* 28.1*  MCV 90.9 92.5 94.9    PLT 169 152 0000000   Basic Metabolic Panel: Recent Labs  Lab 01/12/20 1200 01/13/20 0411 01/14/20 0427  NA 134* 137 141  K 4.0 3.6 4.0  CL 100 107 111  CO2 23 18* 20*  GLUCOSE 79 79 91  BUN 50* 45* 29*  CREATININE 2.65* 2.33* 1.38*  CALCIUM 8.0* 7.7* 7.8*   GFR: Estimated Creatinine Clearance: 28.8 mL/min (A) (by C-G formula based on SCr of 1.38 mg/dL (H)). Liver Function Tests: No results for input(s): AST, ALT, ALKPHOS, BILITOT, PROT, ALBUMIN in the last 168 hours. No results for input(s): LIPASE, AMYLASE in the last 168 hours. No results for input(s): AMMONIA in the last 168 hours. Coagulation Profile: Recent Labs  Lab 01/12/20 1617 01/13/20 0411 01/14/20 0427  INR 4.9* 5.1* 3.1*   Cardiac Enzymes: No results for input(s): CKTOTAL, CKMB, CKMBINDEX, TROPONINI in the last 168 hours. BNP (last 3 results) No results for input(s): PROBNP in the last 8760 hours. HbA1C: No results for input(s): HGBA1C in the last 72 hours. CBG: No results for  input(s): GLUCAP in the last 168 hours. Lipid Profile: No results for input(s): CHOL, HDL, LDLCALC, TRIG, CHOLHDL, LDLDIRECT in the last 72 hours. Thyroid Function Tests: No results for input(s): TSH, T4TOTAL, FREET4, T3FREE, THYROIDAB in the last 72 hours. Anemia Panel: No results for input(s): VITAMINB12, FOLATE, FERRITIN, TIBC, IRON, RETICCTPCT in the last 72 hours. Sepsis Labs: Recent Labs  Lab 01/12/20 1617  PROCALCITON <0.10  LATICACIDVEN 1.8    Recent Results (from the past 240 hour(s))  SARS CORONAVIRUS 2 (TAT 6-24 HRS) Nasopharyngeal Nasopharyngeal Swab     Status: None   Collection Time: 01/12/20  2:13 PM   Specimen: Nasopharyngeal Swab  Result Value Ref Range Status   SARS Coronavirus 2 NEGATIVE NEGATIVE Final    Comment: (NOTE) SARS-CoV-2 target nucleic acids are NOT DETECTED. The SARS-CoV-2 RNA is generally detectable in upper and lower respiratory specimens during the acute phase of infection.  Negative results do not preclude SARS-CoV-2 infection, do not rule out co-infections with other pathogens, and should not be used as the sole basis for treatment or other patient management decisions. Negative results must be combined with clinical observations, patient history, and epidemiological information. The expected result is Negative. Fact Sheet for Patients: SugarRoll.be Fact Sheet for Healthcare Providers: https://www.woods-mathews.com/ This test is not yet approved or cleared by the [place] FDA and  has been authorized for detection and/or diagnosis of SARS-CoV-2 by FDA under an Emergency Use Authorization (EUA). This EUA will remain  in effect (meaning this test can be used) for the duration of the COVID-19 declaration under Section 56 4(b)(1) of the Act, 21 U.S.C. section 360bbb-3(b)(1), unless the authorization is terminated or revoked sooner. Performed at [facility], [place] [address], [place], [place] [address]   Urine culture     Status: None   Collection Time: 01/12/20  3:53 PM   Specimen: Urine, Clean Catch  Result Value Ref Range Status   Specimen Description   Final    URINE, CLEAN CATCH Performed at [facility], [place] [address] [place], [place], [place] [address]    Special Requests   Final    NONE Performed at [facility], [place] [address], [place], [place] [address]    Culture   Final    NO GROWTH Performed at [facility], [place] [address], [place], [place] [address]    Report Status 01/13/2020 FINAL  Final  Culture, blood (routine x 2)     Status: None (Preliminary result)   Collection Time: 01/12/20  4:17 PM   Specimen: BLOOD LEFT HAND  Result Value Ref Range Status   Specimen Description   Final    BLOOD LEFT HAND Performed at [place] [address] [place], [place] [address]    Special Requests   Final    BOTTLES DRAWN AEROBIC AND  ANAEROBIC Blood Culture adequate volume Performed at [place] [facility] [address], [place], [place] [address]    Culture   Final    NO GROWTH < 24 HOURS Performed at [place] [address], [place],  [address]    Report Status PENDING  Incomplete  Culture, blood (routine x 2)     Status: None (Preliminary result)   Collection Time: 01/12/20  4:17 PM   Specimen: BLOOD  Result Value Ref Range Status   Specimen Description   Final    BLOOD LEFT ANTECUBITAL Performed at [place] [address]  [place], [place] [address]    Special Requests   Final    BOTTLES DRAWN AEROBIC AND ANAEROBIC Blood Culture adequate volume Performed at [place] [address] [place], [place] [address]    Culture   Final    NO GROWTH < 24 HOURS Performed at [facility] [address], [place], [place] [address]    Report Status PENDING  Incomplete  Urine culture     Status: None   Collection Time: 01/12/20  4:50 PM   Specimen: Urine, Clean Catch  Result Value Ref Range Status   Specimen Description   Final    URINE, CLEAN CATCH Performed at [facility], [place] [address], [place], [place] [address]    Special Requests   Final    NONE Performed at [facility], [place] [address], [place], [place] [address]    Culture   Final    NO GROWTH Performed at [facility], [place] [address] [place], [place] [address]    Report Status 01/14/2020 FINAL  Final  Blood Culture x 1     Status: None (Preliminary result)   Collection Time: 01/12/20  5:28 PM   Specimen: BLOOD LEFT ARM  Result Value Ref Range Status   Specimen Description   Final    BLOOD LEFT ARM Performed at [facility] [address], [place], [place] [address]    Special Requests   Final    BOTTLES DRAWN AEROBIC AND ANAEROBIC Blood Culture adequate volume Performed at [place] [address], [place], [place] [address]    Culture   Final    NO GROWTH < 24 HOURS Performed at [place] [address], [place], [place] [address]    Report Status PENDING  Incomplete         Radiology Studies: CT ABDOMEN PELVIS WO CONTRAST  Result Date: 01/12/2020 CLINICAL DATA:  Acute generalized abdominal pain with neutropenia. EXAM: CT ABDOMEN AND PELVIS WITHOUT CONTRAST TECHNIQUE: Multidetector CT imaging of the abdomen and pelvis was performed following the standard protocol without IV contrast. COMPARISON:  11/16/2019. FINDINGS: Lower chest: Lung bases are clear. Atherosclerotic calcification of the aorta and coronary arteries. Aortic valve replacement. Heart is enlarged. No pericardial or pleural effusion. Distal esophagus is unremarkable. Hepatobiliary: Liver is unremarkable. A 1.6 cm stone is seen in the gallbladder. No biliary ductal dilatation. Pancreas: Negative. Spleen: Negative. Adrenals/Urinary Tract: Adrenal glands are unremarkable. Tiny stone in the lower pole right kidney. Probable renal sinus cysts on the right. Subcentimeter low-attenuation lesions in the left kidney are too small to characterize. Ureters are decompressed. Bladder is low in volume. Stomach/Bowel: Stomach, small bowel, appendix and colon are unremarkable. Vascular/Lymphatic: Atherosclerotic calcification of the aorta without abdominal aortic aneurysm. There is a new presumed pseudoaneurysm associated with the left common femoral artery, measuring 3.6 cm. No pathologically enlarged lymph nodes. Reproductive: Uterus is visualized.  No adnexal mass. Other: Umbilical hernia contains fat. Postoperative changes along the ventral abdominal wall. No free fluid. Mesenteries and peritoneum are otherwise unremarkable. Musculoskeletal: Advanced degenerative changes in the spine. No worrisome lytic or sclerotic lesions. IMPRESSION: 1. No acute findings to explain the patient's abdominal pain. 2.  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 Please correlate clinically. 3. Cholelithiasis. 4. Tiny right renal stone. 5. Aortic atherosclerosis (ICD10-I70.0). Coronary artery calcification. Electronically Signed   By: Lorin Picket  M.D.   On: 01/12/2020 13:36   DG Chest Portable 1 View  Result Date: 01/12/2020 CLINICAL DATA:  Hypotension. EXAM: PORTABLE CHEST 1 VIEW COMPARISON:  Chest x-ray dated November 30, 2019. FINDINGS: Unchanged mild cardiomegaly status post TAVR. Normal pulmonary vascularity. No focal consolidation, pleural effusion, or pneumothorax. No acute osseous abnormality. IMPRESSION: No active disease. Electronically Signed   By: Titus Dubin M.D.   On: 01/12/2020 13:39        Scheduled Meds: . atorvastatin  80 mg Oral Daily  . Chlorhexidine Gluconate Cloth  6 each Topical Daily  . cholecalciferol  1,000 Units Oral Daily  . clopidogrel  75 mg Oral Daily  . levothyroxine  137 mcg Oral Q0600  . metoprolol tartrate  50 mg Oral BID  . pantoprazole  40 mg Oral Daily  . Urelle  1 tablet Oral TID  . cyanocobalamin  1,000 mcg Oral Daily  . Warfarin - Pharmacist Dosing Inpatient   Does not apply q1800   Continuous Infusions: . sodium chloride 100 mL/hr at 01/14/20 0244  . cefTRIAXone (ROCEPHIN)  IV 1 g (01/13/20 1723)     LOS: 2 days    Time spent: 35 mins.More than 50% of that time was spent in counseling and/or coordination of care.      Shelly Coss, MD [facility] Pager [phone]  If 7PM-7AM, please contact night-coverage [email or web address] Password [facility] 01/14/2020, 12:41 PM

## 2020-01-14 NOTE — Progress Notes (Signed)
[place] for warfarin Indication: atrial fibrillation  Allergies  Allergen Reactions  . Fosamax [Alendronate Sodium] Other (See Comments)    Unknown per daughter    Patient Measurements: Height: 5\' 2"  (157.5 cm) Weight: 165 lb 12.6 oz (75.2 kg) IBW/kg (Calculated) : 50.1   Vital Signs: Temp: 97.3 F (36.3 C) (01/15 0443) Temp Source: Oral (01/15 0443) BP: 110/46 (01/15 0443) Pulse Rate: 77 (01/15 0443)  Labs: Recent Labs    01/12/20 1200 01/12/20 1200 01/12/20 1617 01/13/20 0411 01/14/20 0427  HGB 8.9*   < >  --  8.0* 8.6*  HCT 27.9*  --   --  26.0* 28.1*  PLT 169  --   --  152 160  LABPROT  --   --  45.8* 47.5* 31.8*  INR  --   --  4.9* 5.1* 3.1*  CREATININE 2.65*  --   --  2.33* 1.38*   < > = values in this interval not displayed.    Estimated Creatinine Clearance: 28.8 mL/min (A) (by C-G formula based on SCr of 1.38 mg/dL (H)).   Medical History: Past Medical History:  Diagnosis Date  . Atrial fibrillation, persistent (HCC)    Atrial fibrillation  . CAD (coronary artery disease)    a. 01/2015 NSTEMI/PCI: LM nl, LAD 30p, LCX nondom, 30-40p, 50d, OM1 100p (2.75x16 Rebel BMS), RCA mild diff plaque, EF 55%.  . Cancer ([place])   . Candida infection, disseminated ([place])   . Dementia ([place])   . Hyperlipidemia   . Hypertension   . Hypothyroid   . Osteoporosis   . S/P TAVR (transcatheter aortic valve replacement)    s/p TAVR with 17mm Edwards S3U THV via the TF approach  . Severe aortic stenosis   . Sleep apnea 04/2012   Mild/ AHI 10/CPAP 7cm h2o w/2 L o2    Assessment: 86 y/o F on warfarin PTA for atrial fibrillation. Pharmacy consulted to dose warfarin while in hospital.  Per anti-coag clinic notes (last visit 12/29/2019), PTA dose 2mg  daily except 1mg  on Tuesdays. Per PTA medication list, last dose 01/11/2020 at 1800. Admission INR elevated at 4.9. Fecal occult blood positive.   Today, 01/14/20:  INR = 3.1, trending  down but remains slightly above goal s/p Vit 5mg  yesterday  CBC: Hgb 8.6, Pltc WNL - back to baseline  No overt bleeding noted, no plans for further GI work-up noted  Also on Plavix.  Continue Protonix indefinitely while on anticoagulation per GI  Goal of Therapy:  INR 2-3   Plan:   No warfarin today  Monitor for s/sx of bleeding  Daily PT/INR  Netta Cedars, PharmD, BCPS 01/14/2020 8:17 AM

## 2020-01-14 NOTE — TOC Progression Note (Signed)
Transition of Care [facility]) - Progression Note    Patient Details  Name: [name] MRN: [id number] Date of Birth: [birth year]  Transition [facility] CM/SW Contact  Shajuan Musso, Juliann Pulse, RN Phone Number: 01/14/2020, 4:39 PM  Clinical Narrative:   dtr Santiago Glad wants home w/HHC-will explore [facility] agencies willing to accept. Has rw,lives w/dtr Santiago Glad.Family will transport home on own.    Expected Discharge Plan: [place] Barriers to Discharge: Continued Medical Work up  Expected Discharge Plan and Services Expected Discharge Plan: [place]   Discharge Planning Services: CM Consult   Living arrangements for the past 2 months: Single Family Home                                       Social Determinants of Health (SDOH) Interventions    Readmission Risk Interventions Readmission Risk Prevention Plan 12/01/2019  Post Dischage Appt Complete  Medication Screening Complete  Transportation Screening Complete  Some recent data might be hidden

## 2020-01-14 NOTE — Progress Notes (Signed)
[place] for warfarin - change to apixaban once INR<2 Indication: atrial fibrillation  Allergies  Allergen Reactions  . Fosamax [Alendronate Sodium] Other (See Comments)    Unknown per daughter    Patient Measurements: Height: 5\' 2"  (157.5 cm) Weight: 165 lb 12.6 oz (75.2 kg) IBW/kg (Calculated) : 50.1   Vital Signs: Temp: 97.5 F (36.4 C) (01/15 1412) Temp Source: Axillary (01/15 1412) BP: 111/76 (01/15 1412) Pulse Rate: 89 (01/15 1412)  Labs: Recent Labs    01/12/20 1200 01/12/20 1200 01/12/20 1617 01/13/20 0411 01/14/20 0427  HGB 8.9*   < >  --  8.0* 8.6*  HCT 27.9*  --   --  26.0* 28.1*  PLT 169  --   --  152 160  LABPROT  --   --  45.8* 47.5* 31.8*  INR  --   --  4.9* 5.1* 3.1*  CREATININE 2.65*  --   --  2.33* 1.38*   < > = values in this interval not displayed.    Estimated Creatinine Clearance: 28.8 mL/min (A) (by C-G formula based on SCr of 1.38 mg/dL (H)).   Medical History: Past Medical History:  Diagnosis Date  . Atrial fibrillation, persistent (HCC)    Atrial fibrillation  . CAD (coronary artery disease)    a. 01/2015 NSTEMI/PCI: LM nl, LAD 30p, LCX nondom, 30-40p, 50d, OM1 100p (2.75x16 Rebel BMS), RCA mild diff plaque, EF 55%.  . Cancer ([place])   . Candida infection, disseminated ([place])   . Dementia ([place])   . Hyperlipidemia   . Hypertension   . Hypothyroid   . Osteoporosis   . S/P TAVR (transcatheter aortic valve replacement)    s/p TAVR with 37mm Edwards S3U THV via the TF approach  . Severe aortic stenosis   . Sleep apnea 04/2012   Mild/ AHI 10/CPAP 7cm h2o w/2 L o2    Assessment: 86 y/o F on warfarin PTA for atrial fibrillation. Pharmacy consulted to dose warfarin while in hospital.  Per anti-coag clinic notes (last visit 12/29/2019), PTA dose 2mg  daily except 1mg  on Tuesdays. Per PTA medication list, last dose 01/11/2020 at 1800. Admission INR elevated at 4.9. Fecal occult blood positive.   Today,  01/14/20, PM:  Per Md, plan to change warfarin to apixaban once INR < 2 (currently 3.1)  Goal of Therapy:  INR 2-3   Plan:   Start apixaban 5mg  PO BID once INR < 2 (note that patient only meets 1 criteria for reduced dose 2.5mg  of which she would need 2 in order to qualify for reduced dose 2.5mg  apixaban (age>80, wt<60kg and SCr>1.5)   Monitor for s/sx of bleeding  Daily PT/INR   Adrian Saran, PharmD, BCPS 01/14/2020 5:35 PM

## 2020-01-15 LAB — PROTIME-INR
INR: 3.2 — ABNORMAL HIGH (ref 0.8–1.2)
Prothrombin Time: 32.6 seconds — ABNORMAL HIGH (ref 11.4–15.2)

## 2020-01-15 LAB — CBC WITH DIFFERENTIAL/PLATELET
Abs Immature Granulocytes: 0.04 10*3/uL (ref 0.00–0.07)
Basophils Absolute: 0 10*3/uL (ref 0.0–0.1)
Basophils Relative: 0 %
Eosinophils Absolute: 0.3 10*3/uL (ref 0.0–0.5)
Eosinophils Relative: 4 %
HCT: 26.7 % — ABNORMAL LOW (ref 36.0–46.0)
Hemoglobin: 8.4 g/dL — ABNORMAL LOW (ref 12.0–15.0)
Immature Granulocytes: 1 %
Lymphocytes Relative: 10 %
Lymphs Abs: 0.7 10*3/uL (ref 0.7–4.0)
MCH: 29.2 pg (ref 26.0–34.0)
MCHC: 31.5 g/dL (ref 30.0–36.0)
MCV: 92.7 fL (ref 80.0–100.0)
Monocytes Absolute: 0.5 10*3/uL (ref 0.1–1.0)
Monocytes Relative: 7 %
Neutro Abs: 4.9 10*3/uL (ref 1.7–7.7)
Neutrophils Relative %: 78 %
Platelets: 159 10*3/uL (ref 150–400)
RBC: 2.88 MIL/uL — ABNORMAL LOW (ref 3.87–5.11)
RDW: 16.2 % — ABNORMAL HIGH (ref 11.5–15.5)
WBC: 6.4 10*3/uL (ref 4.0–10.5)
nRBC: 0 % (ref 0.0–0.2)

## 2020-01-15 LAB — BASIC METABOLIC PANEL
Anion gap: 6 (ref 5–15)
BUN: 17 mg/dL (ref 8–23)
CO2: 21 mmol/L — ABNORMAL LOW (ref 22–32)
Calcium: 7.6 mg/dL — ABNORMAL LOW (ref 8.9–10.3)
Chloride: 113 mmol/L — ABNORMAL HIGH (ref 98–111)
Creatinine, Ser: 1.01 mg/dL — ABNORMAL HIGH (ref 0.44–1.00)
GFR calc Af Amer: 59 mL/min — ABNORMAL LOW (ref >60)
GFR calc non Af Amer: 51 mL/min — ABNORMAL LOW (ref >60)
Glucose, Bld: 100 mg/dL — ABNORMAL HIGH (ref 70–99)
Potassium: 3.6 mmol/L (ref 3.5–5.1)
Sodium: 140 mmol/L (ref 135–145)

## 2020-01-15 MED ORDER — PANTOPRAZOLE SODIUM 40 MG PO TBEC: 40.0000 mg | DELAYED_RELEASE_TABLET | Freq: Every day | ORAL | 1 refills | Status: DC

## 2020-01-15 MED ORDER — WARFARIN SODIUM 1 MG PO TABS: 1.0000 mg | ORAL_TABLET | ORAL | Status: DC

## 2020-01-15 NOTE — Progress Notes (Signed)
Per CM Beverlee Nims, [facility] is arranged and pt is ready for D/C from their standpoint. Family to provide transportation home.

## 2020-01-15 NOTE — Discharge Summary (Addendum)
Physician Discharge Summary  LIANI Burke [name] DOB: 1933/07/17 DOA: 01/12/2020  PCP: [name], MD  Admit date: 01/12/2020 Discharge date: 01/18/20 Admitted From: Home Disposition:  SNF  Discharge Condition:Stable CODE STATUS:FULL Diet recommendation: Heart Healthy   Brief/Interim Summary:  Patient is 86 year old female with history of permanent A. fib on Coumadin, coronary artery disease on Plavix, dementia, hypertension, hyperlipidemia, aortic stenosis, history of TAVR who presented from home with complaints of confusion, dehydration.  She lives with her daughter and her daughter feels that she is not able to take care of her anymore.She was also complaining of abdominal discomfort on presentation.  CT abdomen/pelvis did not show any acute findings.  She was hypotensive on presentation with improved after being given  IV fluids.  Found to have acute kidney injury, anemia with hemoglobin in the range of 8, leukocytosis.  Chest x-ray did not show any acute process.  Started on antibiotics for possible UTI but  stopped after urine culture did not show any growth.   GI consulted  for anemia with Hemoccult stool but due to her history of dementia, they did not recommend  any EGD or colonoscopy. Recommended conservative management.  Hemoglobin has remained stable in the range of 7-8 .  PT/OT recommended skilled nursing facility .  Coumadin and plavix will be discontinued until she is seen by her cardiologist as an outpatient. Started on aspirin.   She is hemodynamically stable for discharge to skilled nursing facility .  Following problems were addressed during her hospitalization:  Suspected sepsis secondary to urinary tract infection:Ruled out. Presented with AKI, leukocytosis.  Hypotensive on presentation.  Complained of dysuria, abdominal discomfort on presentation.  Procalcitonin, lactic acid normal.  UA was not that suggestive of UTI.  Blood cultures, urine culture sent,No  growth.  Antibiotics stopped.  Acute blood loss normocytic anemia/upper GI bleed: Her hemoglobin was in the range of 12 on 01/04/2020.  Presented with hemoglobin in the range of 8.  FOBT positive.  INR elevated on presentation.  She was on warfarin and Plavix at home.  GI consulted.  Not planning for EGD or colonoscopy due to her mental status, advanced age.  Recommended Protonix.  Continue to monitor CBC.Hb stable in the range of 7-8.  No active bleeding at present.    Elevated INR: INR supratherapeutic in the range of 5 on presentation . Give  a dose of vitamin K 5 mg once because of Hemoccult positive stool. INR in the range of 2 today.  Check INR in 2 to 3 days.  AKI : Baseline creatinine is normal.Presented with AKI.  resolved  Permanent A. fib: Currently rate is controlled.  On Coumadin at home.  Continue to monitor INR.  Difficult situation regarding her continuation of Coumadin due to GI bleed.She was seen by her cardiologist Dr Debara Pickett today and recommended to stop plavix and warfarin for now.Started on aspirin. She will be followed by her cardiologist as an outpatient .She might need to be started on coumadin at some point but her cardiologist will decide with outpatient follow up.    Coronary artery disease: No chest pain at present.  On Plavix, statin at home.  We will discontinue Plavix permanently.  Continue statin.Started on aspirin.  Hyperlipidemia: On Lipitor.  Hypothyroidism: Levothyroxine  History of severe aortic stenosis: Status post TAVR.  Advanced dementia: Oriented to self only.  Confused at baseline.  PT/OT evaluation done and recommended skilled nursing facility but family wants to take her home.  Home  health arranged.  Hypertension: She was  Hypotensive on arrival.  She was on metoprolol, lisinopril, amlodipine at home .  Metoprolol restarted.  Blood pressure stable without lisinopril and amlodipine.   Discharge Diagnoses:  Principal Problem:   Sepsis  ([place]) Active Problems:   Hyperlipidemia   Essential hypertension   Persistent atrial fibrillation (HCC)   CAD (coronary artery disease)   Hypothyroidism   Dementia (HCC)   Severe aortic stenosis   S/P TAVR (transcatheter aortic valve replacement)   AKI (acute kidney injury) ([facility])   Acute cystitis   Acute upper GI bleed    Discharge Instructions  Discharge Instructions    Diet - low sodium heart healthy   Complete by: As directed    Discharge instructions   Complete by: As directed    1)Follow up with your cardiologist in 1 to 2 weeks.  Discuss if there is  need for continuation of coumadin in the future. 2)Do CBC, BMP tests in a week. 3   Increase activity slowly   Complete by: As directed      Allergies as of 01/18/2020      Reactions   Fosamax [alendronate Sodium] Other (See Comments)   Unknown per daughter      Medication List    STOP taking these medications   amLODipine 5 MG tablet Commonly known as: NORVASC   clopidogrel 75 MG tablet Commonly known as: PLAVIX   lisinopril 5 MG tablet Commonly known as: ZESTRIL   sulfamethoxazole-trimethoprim 800-160 MG tablet Commonly known as: BACTRIM DS   warfarin 1 MG tablet Commonly known as: COUMADIN     TAKE these medications   ALPRAZolam 0.5 MG tablet Commonly known as: Xanax Take 1 tablet (0.5 mg total) by mouth at bedtime as needed for sleep.   aspirin 81 MG chewable tablet Chew 1 tablet (81 mg total) by mouth daily.   atorvastatin 80 MG tablet Commonly known as: LIPITOR Take 80 mg by mouth daily.   cholecalciferol 1000 units tablet Commonly known as: VITAMIN D Take 1,000 Units by mouth daily.   CITRACAL PO Take 1 tablet by mouth 2 (two) times daily.   cyanocobalamin 1000 MCG tablet Take 1,000 mcg by mouth daily.   levothyroxine 137 MCG tablet Commonly known as: SYNTHROID Take 137 mcg by mouth daily before breakfast.   metoprolol tartrate 50 MG tablet Commonly known as: LOPRESSOR Take 50  mg by mouth 2 (two) times daily.   pantoprazole 40 MG tablet Commonly known as: PROTONIX Take 1 tablet (40 mg total) by mouth daily.   traMADol 50 MG tablet Commonly known as: ULTRAM Take 1 tablet (50 mg total) by mouth every 8 (eight) hours as needed.      Follow-up Information    [name], MD. Schedule an appointment as soon as possible for a visit in 1 week(s).   Specialty: Family Medicine Contact information: [id number] [place] [address] [phone]          Allergies  Allergen Reactions  . Fosamax [Alendronate Sodium] Other (See Comments)    Unknown per daughter    Consultations:  GI   Procedures/Studies: CT ABDOMEN PELVIS WO CONTRAST  Result Date: 01/12/2020 CLINICAL DATA:  Acute generalized abdominal pain with neutropenia. EXAM: CT ABDOMEN AND PELVIS WITHOUT CONTRAST TECHNIQUE: Multidetector CT imaging of the abdomen and pelvis was performed following the standard protocol without IV contrast. COMPARISON:  11/16/2019. FINDINGS: Lower chest: Lung bases are clear. Atherosclerotic calcification of the aorta and  coronary arteries. Aortic valve replacement. Heart is enlarged. No pericardial or pleural effusion. Distal esophagus is unremarkable. Hepatobiliary: Liver is unremarkable. A 1.6 cm stone is seen in the gallbladder. No biliary ductal dilatation. Pancreas: Negative. Spleen: Negative. Adrenals/Urinary Tract: Adrenal glands are unremarkable. Tiny stone in the lower pole right kidney. Probable renal sinus cysts on the right. Subcentimeter low-attenuation lesions in the left kidney are too small to characterize. Ureters are decompressed. Bladder is low in volume. Stomach/Bowel: Stomach, small bowel, appendix and colon are unremarkable. Vascular/Lymphatic: Atherosclerotic calcification of the aorta without abdominal aortic aneurysm. There is a new presumed pseudoaneurysm associated with the left common femoral artery, measuring 3.6 cm. No  pathologically enlarged lymph nodes. Reproductive: Uterus is visualized.  No adnexal mass. Other: Umbilical hernia contains fat. Postoperative changes along the ventral abdominal wall. No free fluid. Mesenteries and peritoneum are otherwise unremarkable. Musculoskeletal: Advanced degenerative changes in the spine. No worrisome lytic or sclerotic lesions. IMPRESSION: 1. No acute findings to explain the patient's abdominal pain. 2. 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 Please correlate clinically. 3. Cholelithiasis. 4. Tiny right renal stone. 5. Aortic atherosclerosis (ICD10-I70.0). Coronary artery calcification. Electronically Signed   By: Lorin Picket M.D.   On: 01/12/2020 13:36   DG Chest Portable 1 View  Result Date: 01/12/2020 CLINICAL DATA:  Hypotension. EXAM: PORTABLE CHEST 1 VIEW COMPARISON:  Chest x-ray dated November 30, 2019. FINDINGS: Unchanged mild cardiomegaly status post TAVR. Normal pulmonary vascularity. No focal consolidation, pleural effusion, or pneumothorax. No acute osseous abnormality. IMPRESSION: No active disease. Electronically Signed   By: Titus Dubin M.D.   On: 01/12/2020 13:39   ECHOCARDIOGRAM COMPLETE  Result Date: 12/29/2019   ECHOCARDIOGRAM REPORT   Patient Name:   [name] Date of Exam: 12/29/2019 Medical Rec #:  [id number]        Height:       62.0 in Accession #:    [id number]       Weight:       179.0 lb Date of Birth:  [birth year]         BSA:          1.82 m Patient Age:    67 years         BP:           108/68 mmHg Patient Gender: F                HR:           111 bpm. Exam Location:  [address] Procedure: 2D Echo, Color Doppler and Cardiac Doppler Indications:    Z95.2 Status Post TAVR  History:        Patient has prior history of Echocardiogram examinations, most                 recent 12/01/2019. CAD and Previous Myocardial Infarction, Aortic                 Valve  Disease, Arrythmias:Atrial Fibrillation; Risk                 Factors:Hypertension, Dyslipidemia, Sleep Apnea, Family History                 of Coronary Artery Disease and Former Smoker. Severe AS status                 Post TAVR (61mm Edwards S3).  Sonographer:    Deliah Boston  RDCS Referring Phys: [id number] [place]  1. Left ventricular ejection fraction, by visual estimation, is 65 to 70%. The left ventricle has hyperdynamic function. There is no left ventricular hypertrophy.  2. Left ventricular diastolic function could not be evaluated.  3. The left ventricle has no regional wall motion abnormalities.  4. Global right ventricle has normal systolic function.The right ventricular size is normal. No increase in right ventricular wall thickness.  5. Left atrial size was severely dilated.  6. Right atrial size was moderately dilated.  7. The mitral valve is normal in structure. Mild mitral valve regurgitation. No evidence of mitral stenosis.  8. The tricuspid valve is normal in structure.  9. Aortic valve regurgitation is not visualized. No evidence of aortic valve sclerosis or stenosis. 10. The pulmonic valve was normal in structure. Pulmonic valve regurgitation is not visualized. 11. Moderately elevated pulmonary artery systolic pressure. 12. The tricuspid regurgitant velocity is 3.24 m/s, and with an assumed right atrial pressure of 3 mmHg, the estimated right ventricular systolic pressure is moderately elevated at 45.0 mmHg. 13. The inferior vena cava is normal in size with greater than 50% respiratory variability, suggesting right atrial pressure of 3 mmHg. 14. Stable post TAVR findings, mean transaortic gradient 7 mmHg, trivial paravalvular leak. FINDINGS  Left Ventricle: Left ventricular ejection fraction, by visual estimation, is 65 to 70%. The left ventricle has hyperdynamic function. The left ventricle has no regional wall motion abnormalities. There is no left ventricular  hypertrophy. The left ventricular diastology could not be evaluated due to atrial fibrillation. Left ventricular diastolic function could not be evaluated. Normal left atrial pressure. Right Ventricle: The right ventricular size is normal. No increase in right ventricular wall thickness. Global RV systolic function is has normal systolic function. The tricuspid regurgitant velocity is 3.24 m/s, and with an assumed right atrial pressure  of 3 mmHg, the estimated right ventricular systolic pressure is moderately elevated at 45.0 mmHg. Left Atrium: Left atrial size was severely dilated. Right Atrium: Right atrial size was moderately dilated Pericardium: There is no evidence of pericardial effusion. Mitral Valve: The mitral valve is normal in structure. Mild mitral valve regurgitation. No evidence of mitral valve stenosis by observation. Tricuspid Valve: The tricuspid valve is normal in structure. Tricuspid valve regurgitation is mild. Aortic Valve: The aortic valve has been repaired/replaced. Aortic valve regurgitation is not visualized. The aortic valve is structurally normal, with no evidence of sclerosis or stenosis. Aortic valve mean gradient measures 8.7 mmHg. Aortic valve peak gradient measures 19.8 mmHg. 50mm Edwards [employer], stented aortic valve (TAVR) valve is present in the aortic position. Pulmonic Valve: The pulmonic valve was normal in structure. Pulmonic valve regurgitation is not visualized. Pulmonic regurgitation is not visualized. Aorta: The aortic root, ascending aorta and aortic arch are all structurally normal, with no evidence of dilitation or obstruction. Venous: The inferior vena cava is normal in size with greater than 50% respiratory variability, suggesting right atrial pressure of 3 mmHg. IAS/Shunts: No atrial level shunt detected by color flow Doppler. There is no evidence of a patent foramen ovale. No ventricular septal defect is seen or detected. There is no evidence of an atrial septal  defect.  AORTIC VALVE AV Vmax:      222.33 cm/s AV Vmean:     134.667 cm/s AV VTI:       0.437 m AV Peak Grad: 19.8 mmHg AV Mean Grad: 8.7 mmHg TRICUSPID VALVE TR Peak grad:   42.0 mmHg TR Vmax:  324.00 cm/s  Ena Dawley MD Electronically signed by Ena Dawley MD Signature Date/Time: 12/29/2019/9:53:39 PM    Final       Subjective: Patient seen and examined at the bedside this morning.  Hemodynamically stable for discharge today.  Discharge Exam: Vitals:   01/17/20 2133 01/18/20 0424  BP: 128/63 116/60  Pulse: 99 83  Resp: 18 18  Temp: (!) 97.5 F (36.4 C) (!) 97.3 F (36.3 C)  SpO2: 97% 98%   Vitals:   01/17/20 0610 01/17/20 1322 01/17/20 2133 01/18/20 0424  BP: 137/65 (!) 132/57 128/63 116/60  Pulse: 89 83 99 83  Resp: (!) 22 18 18 18   Temp: 97.6 F (36.4 C) 97.7 F (36.5 C) (!) 97.5 F (36.4 C) (!) 97.3 F (36.3 C)  TempSrc: Oral Oral Oral Oral  SpO2: 99% 97% 97% 98%  Weight:      Height:        General: Pt is alert, awake, not in acute distress Cardiovascular: RRR, S1/S2 +, no rubs, no gallops Respiratory: CTA bilaterally, no wheezing, no rhonchi Abdominal: Soft, NT, ND, bowel sounds + Extremities: no edema, no cyanosis    The results of significant diagnostics from this hospitalization (including imaging, microbiology, ancillary and laboratory) are listed below for reference.     Microbiology: Recent Results (from the past 240 hour(s))  SARS CORONAVIRUS 2 (TAT 6-24 HRS) Nasopharyngeal Nasopharyngeal Swab     Status: None   Collection Time: 01/12/20  2:13 PM   Specimen: Nasopharyngeal Swab  Result Value Ref Range Status   SARS Coronavirus 2 NEGATIVE NEGATIVE Final    Comment: (NOTE) SARS-CoV-2 target nucleic acids are NOT DETECTED. The SARS-CoV-2 RNA is generally detectable in upper and lower respiratory specimens during the acute phase of infection. Negative results do not preclude SARS-CoV-2 infection, do not rule out co-infections with  other pathogens, and should not be used as the sole basis for treatment or other patient management decisions. Negative results must be combined with clinical observations, patient history, and epidemiological information. The expected result is Negative. Fact Sheet for Patients: SugarRoll.be Fact Sheet for Healthcare Providers: https://www.woods-mathews.com/ This test is not yet approved or cleared by the [place] FDA and  has been authorized for detection and/or diagnosis of SARS-CoV-2 by FDA under an Emergency Use Authorization (EUA). This EUA will remain  in effect (meaning this test can be used) for the duration of the COVID-19 declaration under Section 56 4(b)(1) of the Act, 21 U.S.C. section 360bbb-3(b)(1), unless the authorization is terminated or revoked sooner. Performed at [facility], [place] [address], [place], [place] [address]   Urine culture     Status: None   Collection Time: 01/12/20  3:53 PM   Specimen: Urine, Clean Catch  Result Value Ref Range Status   Specimen Description   Final    URINE, CLEAN CATCH Performed at [facility], [place] [address], [place], [place] [address]    Special Requests   Final    NONE Performed at [facility], [place] [address] [place], [place] [address]    Culture   Final    NO GROWTH Performed at [facility], [place] [address] [place], [place] [address]    Report Status 01/13/2020 FINAL  Final  Culture, blood (routine x 2)     Status: None   Collection Time: 01/12/20  4:17 PM   Specimen: BLOOD LEFT HAND  Result Value Ref Range Status   Specimen Description  Final    BLOOD LEFT HAND Performed at [facility], [place] [address], [place], [place] [address]    Special Requests   Final    BOTTLES DRAWN AEROBIC AND ANAEROBIC Blood Culture adequate volume Performed at [place] [address]  [place], [place] [address]    Culture   Final    NO GROWTH 5 DAYS Performed at [facility], [place] [address], [place], [place] [address]    Report Status 01/17/2020 FINAL  Final  Culture, blood (routine x 2)     Status: None   Collection Time: 01/12/20  4:17 PM   Specimen: BLOOD  Result Value Ref Range Status   Specimen Description   Final    BLOOD LEFT ANTECUBITAL Performed at [place] [address], [place], [place] [address]    Special Requests   Final    BOTTLES DRAWN AEROBIC AND ANAEROBIC Blood Culture adequate volume Performed at [place] [address], [place], [place] [address]    Culture   Final    NO GROWTH 5 DAYS Performed at [facility], [place] [address], [place], [place] [address]    Report Status 01/17/2020 FINAL  Final  Urine culture     Status: None   Collection Time: 01/12/20  4:50 PM   Specimen: Urine, Clean Catch  Result Value Ref Range Status   Specimen Description   Final    URINE, CLEAN CATCH [facility]formed at [facility], [place] [address], [place], [place] [address]    Special Requests   Final    NONE Performed at [facility], [place] [address], [place], [place] [address]    Culture   Final    NO GROWTH Performed at [facility], [place] [address] [place], [place] [address]    Report Status 01/14/2020 FINAL  Final  Blood Culture x 1     Status: None   Collection Time: 01/12/20  5:28 PM   Specimen: BLOOD LEFT ARM  Result Value Ref Range Status   Specimen Description   Final    BLOOD LEFT ARM Performed at [place] [address] [place], [place] [address]    Special Requests   Final    BOTTLES DRAWN AEROBIC AND ANAEROBIC Blood Culture adequate volume Performed at [place] [address] [place], [place] [address]    Culture   Final    NO GROWTH 5 DAYS Performed at [facility], [place] [address]  [place], [place] [address]    Report Status 01/17/2020 FINAL  Final  SARS CORONAVIRUS 2 (TAT 6-24 HRS) Nasopharyngeal Nasopharyngeal Swab     Status: None   Collection Time: 01/17/20  2:21 PM   Specimen: Nasopharyngeal Swab  Result Value Ref Range Status   SARS Coronavirus 2 NEGATIVE NEGATIVE Final    Comment: (NOTE) SARS-CoV-2 target nucleic acids are NOT DETECTED. The SARS-CoV-2 RNA is generally detectable in upper and lower respiratory specimens during the acute phase of infection. Negative results do not preclude SARS-CoV-2 infection, do not rule out co-infections with other pathogens, and should not be used as the sole basis for treatment or other patient management decisions. Negative results must be combined with clinical observations, patient history, and epidemiological information. The expected result is Negative. Fact Sheet for Patients: SugarRoll.be Fact Sheet for Healthcare Providers: https://www.woods-mathews.com/ This test is not yet approved or cleared by the [place]  States FDA and  has been authorized for detection and/or diagnosis of SARS-CoV-2 by FDA under an Emergency Use Authorization (EUA). This EUA will remain  in effect (meaning this test can be used) for the duration of the COVID-19 declaration under Section 56 4(b)(1) of the Act, 21 U.S.C. section 360bbb-3(b)(1), unless the authorization is terminated or revoked sooner. Performed at [facility], [place] [address], [place], [place] [address]      Labs: BNP (last 3 results) Recent Labs    11/26/19 1205  BNP 0000000*   Basic Metabolic Panel: Recent Labs  Lab 01/12/20 1200 01/13/20 0411 01/14/20 0427 01/15/20 0540  NA 134* 137 141 140  K 4.0 3.6 4.0 3.6  CL 100 107 111 113*  CO2 23 18* 20* 21*  GLUCOSE 79 79 91 100*  BUN 50* 45* 29* 17  CREATININE 2.65* 2.33* 1.38* 1.01*  CALCIUM 8.0* 7.7* 7.8* 7.6*   Liver Function Tests: No results for input(s): AST,  ALT, ALKPHOS, BILITOT, PROT, ALBUMIN in the last 168 hours. No results for input(s): LIPASE, AMYLASE in the last 168 hours. No results for input(s): AMMONIA in the last 168 hours. CBC: Recent Labs  Lab 01/12/20 1200 01/13/20 0411 01/14/20 0427 01/15/20 0540 01/17/20 0447  WBC 13.2* 8.8 8.3 6.4 7.1  NEUTROABS 11.2*  --   --  4.9 5.1  HGB 8.9* 8.0* 8.6* 8.4* 7.9*  HCT 27.9* 26.0* 28.1* 26.7* 25.2*  MCV 90.9 92.5 94.9 92.7 94.4  PLT 169 152 160 159 182   Cardiac Enzymes: No results for input(s): CKTOTAL, CKMB, CKMBINDEX, TROPONINI in the last 168 hours. BNP: Invalid input(s): POCBNP CBG: No results for input(s): GLUCAP in the last 168 hours. D-Dimer No results for input(s): DDIMER in the last 72 hours. Hgb A1c No results for input(s): HGBA1C in the last 72 hours. Lipid Profile No results for input(s): CHOL, HDL, LDLCALC, TRIG, CHOLHDL, LDLDIRECT in the last 72 hours. Thyroid function studies No results for input(s): TSH, T4TOTAL, T3FREE, THYROIDAB in the last 72 hours.  Invalid input(s): FREET3 Anemia work up No results for input(s): VITAMINB12, FOLATE, FERRITIN, TIBC, IRON, RETICCTPCT in the last 72 hours. Urinalysis    Component Value Date/Time   COLORURINE YELLOW 01/12/2020 1553   APPEARANCEUR CLEAR 01/12/2020 1553   LABSPEC 1.010 01/12/2020 1553   PHURINE 5.0 01/12/2020 1553   GLUCOSEU NEGATIVE 01/12/2020 1553   HGBUR NEGATIVE 01/12/2020 1553   BILIRUBINUR NEGATIVE 01/12/2020 1553   KETONESUR NEGATIVE 01/12/2020 1553   PROTEINUR NEGATIVE 01/12/2020 1553   UROBILINOGEN 0.2 08/11/2014 0829   NITRITE NEGATIVE 01/12/2020 1553   LEUKOCYTESUR NEGATIVE 01/12/2020 1553   Sepsis Labs Invalid input(s): PROCALCITONIN,  WBC,  LACTICIDVEN Microbiology Recent Results (from the past 240 hour(s))  SARS CORONAVIRUS 2 (TAT 6-24 HRS) Nasopharyngeal Nasopharyngeal Swab     Status: None   Collection Time: 01/12/20  2:13 PM   Specimen: Nasopharyngeal Swab  Result Value Ref Range  Status   SARS Coronavirus 2 NEGATIVE NEGATIVE Final    Comment: (NOTE) SARS-CoV-2 target nucleic acids are NOT DETECTED. The SARS-CoV-2 RNA is generally detectable in upper and lower respiratory specimens during the acute phase of infection. Negative results do not preclude SARS-CoV-2 infection, do not rule out co-infections with other pathogens, and should not be used as the sole basis for treatment or other patient management decisions. Negative results must be combined with clinical observations, patient history, and epidemiological information. The expected result is Negative. Fact Sheet for Patients: SugarRoll.be Fact Sheet for  Healthcare Providers: https://www.woods-mathews.com/ This test is not yet approved or cleared by the [place] and  has been authorized for detection and/or diagnosis of SARS-CoV-2 by FDA under an Emergency Use Authorization (EUA). This EUA will remain  in effect (meaning this test can be used) for the duration of the COVID-19 declaration under Section 56 4(b)(1) of the Act, 21 U.S.C. section 360bbb-3(b)(1), unless the authorization is terminated or revoked sooner. Performed at [facility], [place] [address] [place], [place] [address]   Urine culture     Status: None   Collection Time: 01/12/20  3:53 PM   Specimen: Urine, Clean Catch  Result Value Ref Range Status   Specimen Description   Final    URINE, CLEAN CATCH Performed at [facility], [place] [address] [place], [place] [address]    Special Requests   Final    NONE Performed at [facility], [place] [address], [place], [place] [address]    Culture   Final    NO GROWTH Performed at [facility], [place] [address], [place], [place] [address]    Report Status 01/13/2020 FINAL  Final  Culture, blood (routine x 2)     Status: None   Collection Time: 01/12/20  4:17 PM   Specimen: BLOOD LEFT HAND  Result  Value Ref Range Status   Specimen Description   Final    BLOOD LEFT HAND Performed at [place] [address], [place], [place] [address]    Special Requests   Final    BOTTLES DRAWN AEROBIC AND ANAEROBIC Blood Culture adequate volume Performed at [place] [address] [place], [place] [address]    Culture   Final    NO GROWTH 5 DAYS Performed at [facility], [place] [address], [place], [place] [address]    Report Status 01/17/2020 FINAL  Final  Culture, blood (routine x 2)     Status: None   Collection Time: 01/12/20  4:17 PM   Specimen: BLOOD  Result Value Ref Range Status   Specimen Description   Final    BLOOD LEFT ANTECUBITAL Performed at [place] [address], [place], [place] [address]    Special Requests   Final    BOTTLES DRAWN AEROBIC AND ANAEROBIC Blood Culture adequate volume Performed at [place] [address], [place], [place] [address]    Culture   Final    NO GROWTH 5 DAYS Performed at [facility], [place] [address] [place], [place] [address]    Report Status 01/17/2020 FINAL  Final  Urine culture     Status: None   Collection Time: 01/12/20  4:50 PM   Specimen: Urine, Clean Catch  Result Value Ref Range Status   Specimen Description   Final    URINE, CLEAN CATCH Performed at [facility], [place] [address], [place], [place] [address]    Special Requests   Final    NONE Performed at [facility], [place] [address], [place], [place] [address]    Culture   Final    NO GROWTH Performed at [facility], [place] [address], [place],  [address]    Report Status 01/14/2020 FINAL  Final  Blood Culture x 1     Status: None   Collection Time: 01/12/20  5:28 PM   Specimen: BLOOD LEFT ARM  Result Value Ref Range Status  Specimen Description   Final    BLOOD LEFT ARM Performed at [place] [address] [place], [place] [address]    Special Requests   Final    BOTTLES DRAWN AEROBIC AND ANAEROBIC Blood Culture adequate volume Performed at [place] [address] [place], [place] [address]    Culture   Final    NO GROWTH 5 DAYS Performed at [facility], [place] [address], [place], [place] [address]    Report Status 01/17/2020 FINAL  Final  SARS CORONAVIRUS 2 (TAT 6-24 HRS) Nasopharyngeal Nasopharyngeal Swab     Status: None   Collection Time: 01/17/20  2:21 PM   Specimen: Nasopharyngeal Swab  Result Value Ref Range Status   SARS Coronavirus 2 NEGATIVE NEGATIVE Final    Comment: (NOTE) SARS-CoV-2 target nucleic acids are NOT DETECTED. The SARS-CoV-2 RNA is generally detectable in upper and lower respiratory specimens during the acute phase of infection. Negative results do not preclude SARS-CoV-2 infection, do not rule out co-infections with other pathogens, and should not be used as the sole basis for treatment or other patient management decisions. Negative results must be combined with clinical observations, patient history, and epidemiological information. The expected result is Negative. Fact Sheet for Patients: SugarRoll.be Fact Sheet for Healthcare Providers: https://www.woods-mathews.com/ This test is not yet approved or cleared by the [place] FDA and  has been authorized for detection and/or diagnosis of SARS-CoV-2 by FDA under an Emergency Use Authorization (EUA). This EUA will remain  in effect (meaning this test can be used) for the duration of the COVID-19 declaration under Section 56 4(b)(1) of the Act, 21 U.S.C. section 360bbb-3(b)(1), unless the authorization is terminated or revoked sooner. Performed at [facility], [place] [address] [place], [place] [address]     Please note: You were cared for by a hospitalist during your hospital stay. Once you are  discharged, your primary care physician will handle any further medical issues. Please note that NO REFILLS for any discharge medications will be authorized once you are discharged, as it is imperative that you return to your primary care physician (or establish a relationship with a primary care physician if you do not have one) for your post hospital discharge needs so that they can reassess your need for medications and monitor your lab values.    Time coordinating discharge: 40 minutes  SIGNED:   Shelly Coss, MD  [facility] 01/18/2020, 11:47 AM Pager [phone]  If 7PM-7AM, please contact night-coverage [email or web address] Password [email or web address]

## 2020-01-15 NOTE — Plan of Care (Addendum)
Per report - Foley discontinues about 0630 this morning.  After a BM and several attempts to urinate - only 50 cc has been confirmed.  Bladder scan attempted by 2 staff members and did "locate" bladder, but read 68ml x3 attempts.  Dr. Informed and gave verbal orders to hold discharge till patient voids and/or we can verify retention and place foley for discharge.   Per assessment, patient is requiring 2 person assist for all transfers to maintain safety.  Patient appears weak and uncertain about her steps.  Even pivot transfers carry risk for falls.  This assessment was shared with caregiver/daughter/Karen via phone - who has also had x2 back surgeries in the past.  We discussed the next for x2 people for tranfers in the hospital and therefore once she returns home too.   Santiago Glad seemed very concerned and stated she is not able to lift or hold patient up.  She' used to patient basically moving herself with walker and verbal guidance.    This indicates patient is not at mobility baseline and may benefit from rehab if possible.  Santiago Glad gave verbal approval to see if we may be able to send patient to rehab - but also indicated we need to speak to [place].  Barnabas Lister contacted to discuss situation and desire to explore a PT assessment as needed and or possible SNF Rehab for discharge when ready. - No answer at 1305, left message with return callback number.        Barnabas Lister return call and begrudgingly gave verbal approval to consider SNF "temporary" rehab if possible.  Santiago Glad was also physically at his side during the call.  Santiago Glad does not feel comfortable taking care of patient/mom in her current/immobility state.  Dr. Kandice Robinsons to inform. SW informed and is re-researching SNF possibilities.

## 2020-01-16 LAB — PROTIME-INR
INR: 3.5 — ABNORMAL HIGH (ref 0.8–1.2)
Prothrombin Time: 35.5 seconds — ABNORMAL HIGH (ref 11.4–15.2)

## 2020-01-16 NOTE — Progress Notes (Signed)
Took over patient care, no new changes from prior nurses assessment.

## 2020-01-16 NOTE — Progress Notes (Signed)
Spoke with son POA. Discussed his mothers plan of care in regards to need for SNF. Shared that the pt was confused and not eating well. Upset that she is unable to ambulate with a walker as before admission. Stated that she is work off than when she came in. Explained process for SNF informed that SW/CM would talk with him tomorrow. Encouraged him to visit. Discussed her eating habits as well. He shared that she didn't eat much at home either

## 2020-01-16 NOTE — Progress Notes (Signed)
PROGRESS NOTE    [name]  [name] DOB: 1933/02/26 DOA: 01/12/2020 PCP: [name], MD   Brief Narrative:  Patient is 86 year old female with history of permanent A. fib on Coumadin, coronary artery disease on Plavix, dementia, hypertension, hyperlipidemia, aortic stenosis, history of TAVR who presented from home with complaints of confusion, dehydration. She lives with her daughter and her daughter feels that she is not able to take care of her anymore.She was also complaining of abdominal discomfort on presentation. CT abdomen/pelvis did not show any acute findings. She was hypotensive on presentation with improved after being given  IV fluids. Found to have acute kidney injury, anemia with hemoglobin in the range of 8, leukocytosis. Chest x-ray did not show any acute process. Started on antibiotics for possible UTI but  stopped after urine culture did not show any growth. GI consulted for anemia with Hemoccult stool but due to her history of dementia, they did not recommend  any EGD or colonoscopy. Recommendedconservative management.Hemoglobin has remained stable in the range of 8 . PT/OT recommended skilled nursing facility .  We have advised to continue Coumadin if INR is less than 2, discontinue Plavix.  She needs to follow-up with her cardiologist and discuss on the need of continuation of anticoagulation because she has high risk of rebleeding in the future.  She is hemodynamically stable for discharge to skilled nursing facility as soon as bed is available.  Assessment & Plan:   Principal Problem:   Sepsis ([place]) Active Problems:   Hyperlipidemia   Essential hypertension   Persistent atrial fibrillation (HCC)   CAD (coronary artery disease)   Hypothyroidism   Dementia (HCC)   Severe aortic stenosis   S/P TAVR (transcatheter aortic valve replacement)   AKI (acute kidney injury) ([place])   Acute cystitis    Suspected sepsis secondary to urinary tract  infection:Ruled out.Presented with AKI, leukocytosis. Hypotensive on presentation. Complained of dysuria, abdominal discomfort on presentation. Procalcitonin, lactic acid normal. UA was not that suggestive of UTI. Blood cultures, urine culture sent,No growth.Antibiotics stopped.  Acute blood loss normocytic anemia/upper GI bleed: Her hemoglobin was in the range of 12 on 01/04/2020. Presented with hemoglobin in the range of 8. FOBT positive. INR elevated on presentation. She was on warfarin and Plavix at home. GI consulted. Not planning for EGD or colonoscopy due to her mental status, advanced age. Recommended Protonix. Continue to monitor CBC.Hbstable in the range of 8.No active bleeding at present.   Elevated INR: INR supratherapeutic in the range of 5 on presentation .Givea dose of vitamin K 5 mg once because of Hemoccult positive stool. INRin the range of 3 today.  Check INR in 2 to 3 days.  AKI : Baseline creatinine is normal.Presented with AKI.resolved  Permanent A. fib: Currently rate is controlled. On Coumadin at home. Continue to monitor INR. Difficult situation regarding her continuation of Coumadin due to GI bleed.She needs to follow-up with her cardiologist as an outpatient.  Coronary artery disease: No chest pain at present. On Plavix, statin at home. We will discontinue Plavix permanently. Continue statin.  Hyperlipidemia:On Lipitor.  Hypothyroidism: Levothyroxine  History of severe aortic stenosis: Status post TAVR.  Advanced dementia:Oriented to self only. Confused at baseline. PT/OT evaluation done and recommended skilled nursing facility but family wants to take her home.  Home health arranged.  Hypertension:She was Hypotensive on arrival. She was on metoprolol, lisinopril, amlodipine at home .Metoprolol restarted.  Blood pressure stable without lisinopril and amlodipine.    DVT  prophylaxis:SCD Code Status: Full  code Family Communication: called daughter for update on 01/15/20 Disposition Plan: SNF soon as bed is available Consultants: GI  Procedures: None  Antimicrobials:  Anti-infectives (From admission, onward)   Start     Dose/Rate Route Frequency Ordered Stop   01/12/20 1700  Urelle (URELLE/URISED) 81 MG tablet 81 mg     1 tablet Oral 3 times daily 01/12/20 1649 01/15/20 0918   01/12/20 1445  cefTRIAXone (ROCEPHIN) 1 g in sodium chloride 0.9 % 100 mL IVPB  Status:  Discontinued     1 g 200 mL/hr over 30 Minutes Intravenous Every 24 hours 01/12/20 1440 01/14/20 1244      Subjective:  Patient seen and examined at the bedside this morning.  Hemodynamically stable.  Remains confused, talking to herself.  Oriented to self only.  Not in any kind of distress.  Objective: Vitals:   01/15/20 1400 01/15/20 2025 01/16/20 0613 01/16/20 0854  BP: (!) 108/50 119/67 125/63 137/86  Pulse: 82 95 75 78  Resp: 20 18 20    Temp: 97.8 F (36.6 C) 97.9 F (36.6 C) (!) 97.5 F (36.4 C)   TempSrc: Axillary Oral Oral   SpO2: 100% 100% 99%   Weight:      Height:        Intake/Output Summary (Last 24 hours) at 01/16/2020 1118 Last data filed at 01/16/2020 0600 Gross per 24 hour  Intake 205 ml  Output 350 ml  Net -145 ml   Filed Weights   01/13/20 1515  Weight: 75.2 kg    Examination:   General exam: Appears calm and comfortable ,Not in distress,average built HEENT:PERRL,Oral mucosa moist, Ear/Nose normal on gross exam Respiratory system: Bilateral equal air entry, normal vesicular breath sounds, no wheezes or crackles  Cardiovascular system:Afib. No JVD, murmurs, rubs, gallops or clicks. Gastrointestinal system: Abdomen is nondistended, soft and nontender. No organomegaly or masses felt. Normal bowel sounds heard. Central nervous system: Alert and awake but not oriented extremities: No edema, no clubbing ,no cyanosis Skin: No rashes, lesions or ulcers,no icterus ,no pallor     Data  Reviewed: I have personally reviewed following labs and imaging studies  CBC: Recent Labs  Lab 01/12/20 1200 01/13/20 0411 01/14/20 0427 01/15/20 0540  WBC 13.2* 8.8 8.3 6.4  NEUTROABS 11.2*  --   --  4.9  HGB 8.9* 8.0* 8.6* 8.4*  HCT 27.9* 26.0* 28.1* 26.7*  MCV 90.9 92.5 94.9 92.7  PLT 169 152 160 [id number]   Basic Metabolic Panel: Recent Labs  Lab 01/12/20 1200 01/13/20 0411 01/14/20 0427 01/15/20 0540  NA 134* 137 141 140  K 4.0 3.6 4.0 3.6  CL 100 107 111 113*  CO2 23 18* 20* 21*  GLUCOSE 79 79 91 100*  BUN 50* 45* 29* 17  CREATININE 2.65* 2.33* 1.38* 1.01*  CALCIUM 8.0* 7.7* 7.8* 7.6*   GFR: Estimated Creatinine Clearance: 39.3 mL/min (A) (by C-G formula based on SCr of 1.01 mg/dL (H)). Liver Function Tests: No results for input(s): AST, ALT, ALKPHOS, BILITOT, PROT, ALBUMIN in the last 168 hours. No results for input(s): LIPASE, AMYLASE in the last 168 hours. No results for input(s): AMMONIA in the last 168 hours. Coagulation Profile: Recent Labs  Lab 01/12/20 1617 01/13/20 0411 01/14/20 0427 01/15/20 0540 01/16/20 0456  INR 4.9* 5.1* 3.1* 3.2* 3.5*   Cardiac Enzymes: No results for input(s): CKTOTAL, CKMB, CKMBINDEX, TROPONINI in the last 168 hours. BNP (last 3 results) No results for input(s): PROBNP in the  last 8760 hours. HbA1C: No results for input(s): HGBA1C in the last 72 hours. CBG: No results for input(s): GLUCAP in the last 168 hours. Lipid Profile: No results for input(s): CHOL, HDL, LDLCALC, TRIG, CHOLHDL, LDLDIRECT in the last 72 hours. Thyroid Function Tests: No results for input(s): TSH, T4TOTAL, FREET4, T3FREE, THYROIDAB in the last 72 hours. Anemia Panel: No results for input(s): VITAMINB12, FOLATE, FERRITIN, TIBC, IRON, RETICCTPCT in the last 72 hours. Sepsis Labs: Recent Labs  Lab 01/12/20 1617  PROCALCITON <0.10  LATICACIDVEN 1.8    Recent Results (from the past 240 hour(s))  SARS CORONAVIRUS 2 (TAT 6-24 HRS) Nasopharyngeal  Nasopharyngeal Swab     Status: None   Collection Time: 01/12/20  2:13 PM   Specimen: Nasopharyngeal Swab  Result Value Ref Range Status   SARS Coronavirus 2 NEGATIVE NEGATIVE Final    Comment: (NOTE) SARS-CoV-2 target nucleic acids are NOT DETECTED. The SARS-CoV-2 RNA is generally detectable in upper and lower respiratory specimens during the acute phase of infection. Negative results do not preclude SARS-CoV-2 infection, do not rule out co-infections with other pathogens, and should not be used as the sole basis for treatment or other patient management decisions. Negative results must be combined with clinical observations, patient history, and epidemiological information. The expected result is Negative. Fact Sheet for Patients: SugarRoll.be Fact Sheet for Healthcare Providers: https://www.woods-mathews.com/ This test is not yet approved or cleared by the [place] FDA and  has been authorized for detection and/or diagnosis of SARS-CoV-2 by FDA under an Emergency Use Authorization (EUA). This EUA will remain  in effect (meaning this test can be used) for the duration of the COVID-19 declaration under Section 56 4(b)(1) of the Act, 21 U.S.C. section 360bbb-3(b)(1), unless the authorization is terminated or revoked sooner. Performed at [facility], [place] [address], [place], [place] [address]   Urine culture     Status: None   Collection Time: 01/12/20  3:53 PM   Specimen: Urine, Clean Catch  Result Value Ref Range Status   Specimen Description   Final    URINE, CLEAN CATCH Performed at [facility], [place] [address], [place], [place] [address]    Special Requests   Final    NONE Performed at [facility], [place] [address] [place], [place] [address]    Culture   Final    NO GROWTH Performed at [facility], [place] [address], [place], [place] [address]    Report Status 01/13/2020  FINAL  Final  Culture, blood (routine x 2)     Status: None (Preliminary result)   Collection Time: 01/12/20  4:17 PM   Specimen: BLOOD LEFT HAND  Result Value Ref Range Status   Specimen Description   Final    BLOOD LEFT HAND Performed at [place] [address], [place], [place] [address]    Special Requests   Final    BOTTLES DRAWN AEROBIC AND ANAEROBIC Blood Culture adequate volume Performed at [place] [address] [place], [place] [address]    Culture   Final    NO GROWTH 4 DAYS Performed at [facility], [place] [address], [place], [place] [address]    Report Status PENDING  Incomplete  Culture, blood (routine x 2)     Status: None (Preliminary result)   Collection Time: 01/12/20  4:17 PM   Specimen: BLOOD  Result Value Ref Range Status   Specimen Description  Final    BLOOD LEFT ANTECUBITAL Performed at [place] [address], [place], [place] [address]    Special Requests   Final    BOTTLES DRAWN AEROBIC AND ANAEROBIC Blood Culture adequate volume Performed at [place] [address] [place], [place] [address]    Culture   Final    NO GROWTH 4 DAYS Performed at [facility], [place] [address], [place], [place] [address]    Report Status PENDING  Incomplete  Urine culture     Status: None   Collection Time: 01/12/20  4:50 PM   Specimen: Urine, Clean Catch  Result Value Ref Range Status   Specimen Description   Final    URINE, CLEAN CATCH Performed at [facility], [place] [address], [place], [place] [address]    Special Requests   Final    NONE Performed at [facility], [place] [address] [place], [place] [address]    Culture   Final    NO GROWTH Performed at [facility], [place] [address] [place], [place] [address]    Report Status 01/14/2020 FINAL  Final  Blood Culture x 1     Status: None (Preliminary result)     Collection Time: 01/12/20  5:28 PM   Specimen: BLOOD LEFT ARM  Result Value Ref Range Status   Specimen Description   Final    BLOOD LEFT ARM Performed at [place] [address], [place], [place] [address]    Special Requests   Final    BOTTLES DRAWN AEROBIC AND ANAEROBIC Blood Culture adequate volume Performed at [place] [address] [place], [place] [address]    Culture   Final    NO GROWTH 4 DAYS Performed at [facility], [place] [address] [place], [place] [address]    Report Status PENDING  Incomplete         Radiology Studies: No results found.      Scheduled Meds: . atorvastatin  80 mg Oral Daily  . Chlorhexidine Gluconate Cloth  6 each Topical Daily  . cholecalciferol  1,000 Units Oral Daily  . levothyroxine  137 mcg Oral Q0600  . metoprolol tartrate  50 mg Oral BID  . pantoprazole  40 mg Oral Daily  . cyanocobalamin  1,000 mcg Oral Daily   Continuous Infusions:    LOS: 4 days    Time spent: 35 mins.More than 50% of that time was spent in counseling and/or coordination of care.      Shelly Coss, MD [facility] Pager [phone]  If 7PM-7AM, please contact night-coverage [email or web address] Password [email or web address] 01/16/2020, 11:18 AM

## 2020-01-17 LAB — CBC WITH DIFFERENTIAL/PLATELET
Abs Immature Granulocytes: 0.04 10*3/uL (ref 0.00–0.07)
Basophils Absolute: 0 10*3/uL (ref 0.0–0.1)
Basophils Relative: 0 %
Eosinophils Absolute: 0.3 10*3/uL (ref 0.0–0.5)
Eosinophils Relative: 4 %
HCT: 25.2 % — ABNORMAL LOW (ref 36.0–46.0)
Hemoglobin: 7.9 g/dL — ABNORMAL LOW (ref 12.0–15.0)
Immature Granulocytes: 1 %
Lymphocytes Relative: 16 %
Lymphs Abs: 1.1 10*3/uL (ref 0.7–4.0)
MCH: 29.6 pg (ref 26.0–34.0)
MCHC: 31.3 g/dL (ref 30.0–36.0)
MCV: 94.4 fL (ref 80.0–100.0)
Monocytes Absolute: 0.6 10*3/uL (ref 0.1–1.0)
Monocytes Relative: 8 %
Neutro Abs: 5.1 10*3/uL (ref 1.7–7.7)
Neutrophils Relative %: 71 %
Platelets: 182 10*3/uL (ref 150–400)
RBC: 2.67 MIL/uL — ABNORMAL LOW (ref 3.87–5.11)
RDW: 17 % — ABNORMAL HIGH (ref 11.5–15.5)
WBC: 7.1 10*3/uL (ref 4.0–10.5)
nRBC: 0 % (ref 0.0–0.2)

## 2020-01-17 LAB — CULTURE, BLOOD (ROUTINE X 2)
Culture: NO GROWTH
Culture: NO GROWTH
Special Requests: ADEQUATE
Special Requests: ADEQUATE

## 2020-01-17 LAB — PROTIME-INR
INR: 2.3 — ABNORMAL HIGH (ref 0.8–1.2)
Prothrombin Time: 25.3 seconds — ABNORMAL HIGH (ref 11.4–15.2)

## 2020-01-17 LAB — CULTURE, BLOOD (SINGLE)
Culture: NO GROWTH
Special Requests: ADEQUATE

## 2020-01-17 LAB — SARS CORONAVIRUS 2 (TAT 6-24 HRS): SARS Coronavirus 2: NEGATIVE

## 2020-01-17 MED ORDER — WARFARIN SODIUM 1 MG PO TABS: 1.0000 mg | ORAL_TABLET | ORAL | Status: DC

## 2020-01-17 NOTE — Progress Notes (Signed)
Resumed care of patient. Agree with previous RN's assessment. Will continue with plan of care.

## 2020-01-17 NOTE — TOC Progression Note (Signed)
Transition of Care [facility]) - Progression Note    Patient Details  Name: [name] MRN: [id number] Date of Birth: [birth year]  Transition [facility] CM/SW Contact  Joaquin Courts, RN Phone Number: 01/17/2020, 9:17 AM  Clinical Narrative:    CM spoke with patient's daughter regarding dc. Daughter reports that she has been thinking about best course of action and is at the hospital right now visiting her mom and sees that she is very weak. Daughter reports that she will be unable to care for her mom at home in her current condition and has decided that SNF for rehab is the best option.  CM discussed SNF placement process and faxed out FL2 to area facilities. Will await bed offers. Of note pt has [facility] medicare, which has a waiver in place so no auth will be needed prior to dc to SNF.    Expected Discharge Plan: [place] Barriers to Discharge: No SNF bed  Expected Discharge Plan and Services Expected Discharge Plan: [place]   Discharge Planning Services: CM Consult   Living arrangements for the past 2 months: Single Family Home Expected Discharge Date: 01/17/20                                     Social Determinants of Health (SDOH) Interventions    Readmission Risk Interventions Readmission Risk Prevention Plan 12/01/2019  Post Dischage Appt Complete  Medication Screening Complete  Transportation Screening Complete  Some recent data might be hidden

## 2020-01-17 NOTE — NC FL2 (Signed)
[place] LEVEL OF CARE SCREENING TOOL     IDENTIFICATION  Patient Name: [name] Birthdate: 1933/04/28 Sex: female Admission Date (Current Location): 01/12/2020  [facility] and [place] Number:  Herbalist and Address:  [facility],  [place] [address], [place]      Provider Number: [id number]  Attending Physician Name and Address:  Shelly Coss, MD  Relative Name and Phone Number:       Current Level of Care: Hospital Recommended Level of Care: [place] Prior Approval Number:    Date Approved/Denied:   PASRR Number: [id number]  Discharge Plan: SNF    Current Diagnoses: Patient Active Problem List   Diagnosis Date Noted  . Sepsis ([place]) 01/12/2020  . AKI (acute kidney injury) ([place]) 01/12/2020  . Acute cystitis 01/12/2020  . Urinary retention 11/30/2019  . S/P TAVR (transcatheter aortic valve replacement)   . Severe aortic stenosis   . Dementia ([place]) 09/16/2018  . Hypothyroidism 02/21/2015  . Persistent atrial fibrillation ([place])   . CAD (coronary artery disease)   . Hyperlipidemia   . Essential hypertension   . Osteoporosis   . Sleep apnea 04/29/2012    Orientation RESPIRATION BLADDER Height & Weight     Self  Normal Incontinent Weight: 75.2 kg Height:  5\' 2"  (157.5 cm)  BEHAVIORAL SYMPTOMS/MOOD NEUROLOGICAL BOWEL NUTRITION STATUS      Continent Diet  AMBULATORY STATUS COMMUNICATION OF NEEDS Skin   Extensive Assist Verbally Normal                       Personal Care Assistance Level of Assistance  Bathing, Dressing, Total care Bathing Assistance: Maximum assistance   Dressing Assistance: Maximum assistance Total Care Assistance: Maximum assistance   Functional Limitations Info             SPECIAL CARE FACTORS FREQUENCY  PT (By licensed PT), OT (By licensed OT)     PT Frequency: 5x a week OT Frequency: 5x a week            Contractures Contractures Info: Not present     Additional Factors Info  Code Status, Allergies Code Status Info: Full Allergies Info: Fosamax           Current Medications (01/17/2020):  This is the current hospital active medication list Current Facility-Administered Medications  Medication Dose Route Frequency Provider Last Rate Last Admin  . acetaminophen (TYLENOL) tablet 650 mg  650 mg Oral Q6H PRN Rai, Ripudeep K, MD   650 mg at 01/16/20 2141   Or  . acetaminophen (TYLENOL) suppository 650 mg  650 mg Rectal Q6H PRN Rai, Ripudeep K, MD      . ALPRAZolam Duanne Moron) tablet 0.5 mg  0.5 mg Oral QHS PRN Rai, Ripudeep K, MD   0.5 mg at 01/16/20 2141  . atorvastatin (LIPITOR) tablet 80 mg  80 mg Oral Daily Rai, Ripudeep K, MD   80 mg at 01/17/20 0906  . Chlorhexidine Gluconate Cloth 2 % PADS 6 each  6 each Topical Daily Rai, Ripudeep K, MD   6 each at 01/17/20 0907  . cholecalciferol (VITAMIN D3) tablet 1,000 Units  1,000 Units Oral Daily Rai, Ripudeep K, MD   1,000 Units at 01/17/20 0906  . levothyroxine (SYNTHROID) tablet 137 mcg  137 mcg Oral Q0600 Rai, Vernelle Emerald, MD   137 mcg at 01/17/20 0906  . metoprolol tartrate (LOPRESSOR) tablet 50 mg  50 mg Oral BID Adhikari,  Amrit, MD   50 mg at 01/17/20 0907  . ondansetron (ZOFRAN) tablet 4 mg  4 mg Oral Q6H PRN Rai, Ripudeep K, MD       Or  . ondansetron (ZOFRAN) injection 4 mg  4 mg Intravenous Q6H PRN Rai, Ripudeep K, MD      . pantoprazole (PROTONIX) EC tablet 40 mg  40 mg Oral Daily Rai, Ripudeep K, MD   40 mg at 01/17/20 0906  . vitamin B-12 (CYANOCOBALAMIN) tablet 1,000 mcg  1,000 mcg Oral Daily Rai, Ripudeep K, MD   1,000 mcg at 01/17/20 0907     Discharge Medications: Please see discharge summary for a list of discharge medications.  Relevant Imaging Results:  Relevant Lab Results:   Additional Information SSN [id number]  Joaquin Courts, RN

## 2020-01-17 NOTE — Progress Notes (Signed)
PROGRESS NOTE    [name]  [name] DOB: [birth year] DOA: 01/12/2020 PCP: [name], MD   Brief Narrative:  Patient is 86 year old female with history of permanent A. fib on Coumadin, coronary artery disease on Plavix, dementia, hypertension, hyperlipidemia, aortic stenosis, history of TAVR who presented from home with complaints of confusion, dehydration. She lives with her daughter and her daughter feels that she is not able to take care of her anymore.She was also complaining of abdominal discomfort on presentation. CT abdomen/pelvis did not show any acute findings. She was hypotensive on presentation with improved after being given  IV fluids. Found to have acute kidney injury, anemia with hemoglobin in the range of 8, leukocytosis. Chest x-ray did not show any acute process. Started on antibiotics for possible UTI but  stopped after urine culture did not show any growth. GI consulted for anemia with Hemoccult stool but due to her history of dementia, they did not recommend  any EGD or colonoscopy. Recommendedconservative management.Hemoglobin has remained stable in the range of 8 . PT/OT recommended skilled nursing facility .  We have advised to continue Coumadin if INR is less than 2, discontinue Plavix.  She needs to follow-up with her cardiologist and discuss on the need of continuation of anticoagulation because she has high risk of rebleeding in the future.  She is hemodynamically stable for discharge to skilled nursing facility as soon as bed is available.  Assessment & Plan:   Principal Problem:   Sepsis ([place]) Active Problems:   Hyperlipidemia   Essential hypertension   Persistent atrial fibrillation (HCC)   CAD (coronary artery disease)   Hypothyroidism   Dementia (HCC)   Severe aortic stenosis   S/P TAVR (transcatheter aortic valve replacement)   AKI (acute kidney injury) ([place])   Acute cystitis    Suspected sepsis secondary to urinary tract  infection:Ruled out.Presented with AKI, leukocytosis. Hypotensive on presentation. Complained of dysuria, abdominal discomfort on presentation. Procalcitonin, lactic acid normal. UA was not that suggestive of UTI. Blood cultures, urine culture sent,No growth.Antibiotics stopped.  Acute blood loss normocytic anemia/upper GI bleed: Her hemoglobin was in the range of 12 on 01/04/2020. Presented with hemoglobin in the range of 8. FOBT positive. INR elevated on presentation. She was on warfarin and Plavix at home. GI consulted. Not planning for EGD or colonoscopy due to her mental status, advanced age. Recommended Protonix. Continue to monitor CBC.Hbstable in the range of 8.No active bleeding at present.   Elevated INR: INR supratherapeutic in the range of 5 on presentation .Givea dose of vitamin K 5 mg once because of Hemoccult positive stool. INRin the range of 2 today.    AKI : Baseline creatinine is normal.Presented with AKI.resolved  Permanent A. fib: Currently rate is controlled. On Coumadin at home. Continue to monitor INR. Difficult situation regarding her continuation of Coumadin due to GI bleed.She needs to follow-up with her cardiologist as an outpatient.  Coronary artery disease: No chest pain at present. On Plavix, statin at home. We will discontinue Plavix permanently. Continue statin.  Hyperlipidemia:On Lipitor.  Hypothyroidism: Levothyroxine  History of severe aortic stenosis: Status post TAVR.  Advanced dementia:Oriented to self only. Confused at baseline. PT/OT evaluation done and recommended skilled nursing facility but family wants to take her home.  Home health arranged.  Hypertension:She was Hypotensive on arrival. She was on metoprolol, lisinopril, amlodipine at home .Metoprolol restarted.  Blood pressure stable without lisinopril and amlodipine.    DVT prophylaxis:SCD Code Status: Full code Family  Communication: called  daughter for update on 01/15/20 Disposition Plan: SNF soon as bed is available Consultants: GI  Procedures: None  Antimicrobials:  Anti-infectives (From admission, onward)   Start     Dose/Rate Route Frequency Ordered Stop   01/12/20 1700  Urelle (URELLE/URISED) 81 MG tablet 81 mg     1 tablet Oral 3 times daily 01/12/20 1649 01/15/20 0918   01/12/20 1445  cefTRIAXone (ROCEPHIN) 1 g in sodium chloride 0.9 % 100 mL IVPB  Status:  Discontinued     1 g 200 mL/hr over 30 Minutes Intravenous Every 24 hours 01/12/20 1440 01/14/20 1244      Subjective:  Patient seen and examined the bedside this morning.  Hemodynamically stable.  Comfortable.  Confused at baseline.  No active issues.  Stable for discharge  Objective: Vitals:   01/16/20 0854 01/16/20 1318 01/16/20 2035 01/17/20 0610  BP: 137/86 (!) 149/74 133/69 137/65  Pulse: 78 89 90 89  Resp:  20 18 (!) 22  Temp:  97.7 F (36.5 C) 98.6 F (37 C) 97.6 F (36.4 C)  TempSrc:  Oral Oral Oral  SpO2:  97% 95% 99%  Weight:      Height:        Intake/Output Summary (Last 24 hours) at 01/17/2020 1150 Last data filed at 01/17/2020 0600 Gross per 24 hour  Intake 20 ml  Output 300 ml  Net -280 ml   Filed Weights   01/13/20 1515  Weight: 75.2 kg    Examination:  General exam: Comfortable, dementia Respiratory system: Bilateral equal air entry, normal vesicular breath sounds, no wheezes or crackles  Cardiovascular system: S1 & S2 heard, RRR. Gastrointestinal system: Abdomen is nondistended, soft and nontender. No organomegaly or masses felt. Normal bowel sounds heard. Central nervous system: Alert and awake but not oriented  extremities: No edema, no clubbing ,no cyanosis, distal peripheral pulses palpable. Skin: No rashes, lesions or ulcers,no icterus ,no pallor    Data Reviewed: I have personally reviewed following labs and imaging studies  CBC: Recent Labs  Lab 01/12/20 1200 01/13/20 0411 01/14/20 0427 01/15/20 0540  01/17/20 0447  WBC 13.2* 8.8 8.3 6.4 7.1  NEUTROABS 11.2*  --   --  4.9 5.1  HGB 8.9* 8.0* 8.6* 8.4* 7.9*  HCT 27.9* 26.0* 28.1* 26.7* 25.2*  MCV 90.9 92.5 94.9 92.7 94.4  PLT 169 152 160 159 [id number]   Basic Metabolic Panel: Recent Labs  Lab 01/12/20 1200 01/13/20 0411 01/14/20 0427 01/15/20 0540  NA 134* 137 141 140  K 4.0 3.6 4.0 3.6  CL 100 107 111 113*  CO2 23 18* 20* 21*  GLUCOSE 79 79 91 100*  BUN 50* 45* 29* 17  CREATININE 2.65* 2.33* 1.38* 1.01*  CALCIUM 8.0* 7.7* 7.8* 7.6*   GFR: Estimated Creatinine Clearance: 39.3 mL/min (A) (by C-G formula based on SCr of 1.01 mg/dL (H)). Liver Function Tests: No results for input(s): AST, ALT, ALKPHOS, BILITOT, PROT, ALBUMIN in the last 168 hours. No results for input(s): LIPASE, AMYLASE in the last 168 hours. No results for input(s): AMMONIA in the last 168 hours. Coagulation Profile: Recent Labs  Lab 01/13/20 0411 01/14/20 0427 01/15/20 0540 01/16/20 0456 01/17/20 0447  INR 5.1* 3.1* 3.2* 3.5* 2.3*   Cardiac Enzymes: No results for input(s): CKTOTAL, CKMB, CKMBINDEX, TROPONINI in the last 168 hours. BNP (last 3 results) No results for input(s): PROBNP in the last 8760 hours. HbA1C: No results for input(s): HGBA1C in the last 72 hours. CBG: No  results for input(s): GLUCAP in the last 168 hours. Lipid Profile: No results for input(s): CHOL, HDL, LDLCALC, TRIG, CHOLHDL, LDLDIRECT in the last 72 hours. Thyroid Function Tests: No results for input(s): TSH, T4TOTAL, FREET4, T3FREE, THYROIDAB in the last 72 hours. Anemia Panel: No results for input(s): VITAMINB12, FOLATE, FERRITIN, TIBC, IRON, RETICCTPCT in the last 72 hours. Sepsis Labs: Recent Labs  Lab 01/12/20 1617  PROCALCITON <0.10  LATICACIDVEN 1.8    Recent Results (from the past 240 hour(s))  SARS CORONAVIRUS 2 (TAT 6-24 HRS) Nasopharyngeal Nasopharyngeal Swab     Status: None   Collection Time: 01/12/20  2:13 PM   Specimen: Nasopharyngeal Swab  Result  Value Ref Range Status   SARS Coronavirus 2 NEGATIVE NEGATIVE Final    Comment: (NOTE) SARS-CoV-2 target nucleic acids are NOT DETECTED. The SARS-CoV-2 RNA is generally detectable in upper and lower respiratory specimens during the acute phase of infection. Negative results do not preclude SARS-CoV-2 infection, do not rule out co-infections with other pathogens, and should not be used as the sole basis for treatment or other patient management decisions. Negative results must be combined with clinical observations, patient history, and epidemiological information. The expected result is Negative. Fact Sheet for Patients: SugarRoll.be Fact Sheet for Healthcare Providers: https://www.woods-mathews.com/ This test is not yet approved or cleared by the [place] FDA and  has been authorized for detection and/or diagnosis of SARS-CoV-2 by FDA under an Emergency Use Authorization (EUA). This EUA will remain  in effect (meaning this test can be used) for the duration of the COVID-19 declaration under Section 56 4(b)(1) of the Act, 21 U.S.C. section 360bbb-3(b)(1), unless the authorization is terminated or revoked sooner. Performed at [facility], [place] [address], [place], [place] [address]   Urine culture     Status: None   Collection Time: 01/12/20  3:53 PM   Specimen: Urine, Clean Catch  Result Value Ref Range Status   Specimen Description   Final    URINE, CLEAN CATCH Performed at [facility], [place] [address] [place], [place] [address]    Special Requests   Final    NONE Performed at [facility], [place] [address], [place], [place] [address]    Culture   Final    NO GROWTH Performed at [facility], [place] [address], [place], [place] [address]    Report Status 01/13/2020 FINAL  Final  Culture, blood (routine x 2)     Status: None   Collection Time: 01/12/20  4:17 PM   Specimen: BLOOD  LEFT HAND  Result Value Ref Range Status   Specimen Description   Final    BLOOD LEFT HAND Performed at [place] [address] [place], [place] [address]    Special Requests   Final    BOTTLES DRAWN AEROBIC AND ANAEROBIC Blood Culture adequate volume Performed at [facility] [address] [place], [place] [address]    Culture   Final    NO GROWTH 5 DAYS Performed at [facility], [place] [address], [place],  [address]    Report Status 01/17/2020 FINAL  Final  Culture, blood (routine x 2)     Status: None   Collection Time: 01/12/20  4:17 PM   Specimen: BLOOD  Result Value Ref Range Status   Specimen Description   Final    BLOOD LEFT ANTECUBITAL Performed at [place] [address] [place], [place]  [address]    Special Requests   Final    BOTTLES DRAWN AEROBIC AND ANAEROBIC Blood Culture adequate volume Performed at [place] [address], [place], [place] [address]    Culture   Final    NO GROWTH 5 DAYS Performed at [facility], [place] [address], [place], [place] [address]    Report Status 01/17/2020 FINAL  Final  Urine culture     Status: None   Collection Time: 01/12/20  4:50 PM   Specimen: Urine, Clean Catch  Result Value Ref Range Status   Specimen Description   Final    URINE, CLEAN CATCH Performed at [facility], [place] [address], [place], [place] [address]    Special Requests   Final    NONE Performed at [facility], [place] [address], [place], [place] [address]    Culture   Final    NO GROWTH Performed at [facility], [place] [address] [place], [place] [address]    Report Status 01/14/2020 FINAL  Final  Blood Culture x 1     Status: None   Collection Time: 01/12/20  5:28 PM   Specimen: BLOOD LEFT ARM  Result Value Ref Range Status   Specimen Description   Final    BLOOD LEFT ARM Performed at [place] [address] [place], [place] [address]    Special Requests   Final    BOTTLES DRAWN AEROBIC AND ANAEROBIC Blood Culture adequate volume Performed at [place] [address] [place], [place] [address]    Culture   Final    NO GROWTH 5 DAYS Performed at [facility], [place] [address], [place], [place] [address]    Report Status 01/17/2020 FINAL  Final         Radiology Studies: No results found.      Scheduled Meds: . atorvastatin  80 mg Oral Daily  . Chlorhexidine Gluconate Cloth  6 each Topical Daily  . cholecalciferol  1,000 Units Oral Daily  . levothyroxine  137 mcg Oral Q0600  . metoprolol tartrate  50 mg Oral BID  . pantoprazole  40 mg Oral Daily  . cyanocobalamin  1,000 mcg Oral Daily   Continuous Infusions:    LOS: 5 days    Time spent: 35 mins.More than 50% of that time was spent in counseling and/or coordination of care.      Shelly Coss, MD [facility] Pager [phone]  If 7PM-7AM, please contact night-coverage [email or web address] Password [facility] 01/17/2020, 11:50 AM

## 2020-01-18 DIAGNOSIS — E039 Hypothyroidism, unspecified: Secondary | ICD-10-CM | POA: Diagnosis not present

## 2020-01-18 DIAGNOSIS — I350 Nonrheumatic aortic (valve) stenosis: Secondary | ICD-10-CM | POA: Diagnosis not present

## 2020-01-18 DIAGNOSIS — N390 Urinary tract infection, site not specified: Secondary | ICD-10-CM | POA: Diagnosis not present

## 2020-01-18 DIAGNOSIS — Z5181 Encounter for therapeutic drug level monitoring: Secondary | ICD-10-CM | POA: Diagnosis not present

## 2020-01-18 DIAGNOSIS — G309 Alzheimer's disease, unspecified: Secondary | ICD-10-CM | POA: Diagnosis not present

## 2020-01-18 DIAGNOSIS — K922 Gastrointestinal hemorrhage, unspecified: Secondary | ICD-10-CM | POA: Diagnosis not present

## 2020-01-18 DIAGNOSIS — R319 Hematuria, unspecified: Secondary | ICD-10-CM | POA: Diagnosis not present

## 2020-01-18 DIAGNOSIS — E559 Vitamin D deficiency, unspecified: Secondary | ICD-10-CM | POA: Diagnosis not present

## 2020-01-18 DIAGNOSIS — D649 Anemia, unspecified: Secondary | ICD-10-CM | POA: Diagnosis not present

## 2020-01-18 DIAGNOSIS — Z7401 Bed confinement status: Secondary | ICD-10-CM | POA: Diagnosis not present

## 2020-01-18 DIAGNOSIS — M2550 Pain in unspecified joint: Secondary | ICD-10-CM | POA: Diagnosis not present

## 2020-01-18 DIAGNOSIS — G9341 Metabolic encephalopathy: Secondary | ICD-10-CM | POA: Diagnosis not present

## 2020-01-18 DIAGNOSIS — Z743 Need for continuous supervision: Secondary | ICD-10-CM | POA: Diagnosis not present

## 2020-01-18 DIAGNOSIS — R5381 Other malaise: Secondary | ICD-10-CM | POA: Diagnosis not present

## 2020-01-18 DIAGNOSIS — M79671 Pain in right foot: Secondary | ICD-10-CM | POA: Diagnosis not present

## 2020-01-18 DIAGNOSIS — Z79899 Other long term (current) drug therapy: Secondary | ICD-10-CM | POA: Diagnosis not present

## 2020-01-18 DIAGNOSIS — D519 Vitamin B12 deficiency anemia, unspecified: Secondary | ICD-10-CM | POA: Diagnosis not present

## 2020-01-18 DIAGNOSIS — K2971 Gastritis, unspecified, with bleeding: Secondary | ICD-10-CM | POA: Diagnosis not present

## 2020-01-18 DIAGNOSIS — E46 Unspecified protein-calorie malnutrition: Secondary | ICD-10-CM | POA: Diagnosis not present

## 2020-01-18 DIAGNOSIS — I4891 Unspecified atrial fibrillation: Secondary | ICD-10-CM | POA: Diagnosis not present

## 2020-01-18 DIAGNOSIS — K921 Melena: Secondary | ICD-10-CM | POA: Diagnosis not present

## 2020-01-18 DIAGNOSIS — R0602 Shortness of breath: Secondary | ICD-10-CM | POA: Diagnosis not present

## 2020-01-18 DIAGNOSIS — I2510 Atherosclerotic heart disease of native coronary artery without angina pectoris: Secondary | ICD-10-CM | POA: Diagnosis not present

## 2020-01-18 DIAGNOSIS — Z952 Presence of prosthetic heart valve: Secondary | ICD-10-CM

## 2020-01-18 DIAGNOSIS — I4819 Other persistent atrial fibrillation: Secondary | ICD-10-CM | POA: Diagnosis not present

## 2020-01-18 DIAGNOSIS — I10 Essential (primary) hypertension: Secondary | ICD-10-CM | POA: Diagnosis not present

## 2020-01-18 DIAGNOSIS — N179 Acute kidney failure, unspecified: Secondary | ICD-10-CM | POA: Diagnosis not present

## 2020-01-18 DIAGNOSIS — I5032 Chronic diastolic (congestive) heart failure: Secondary | ICD-10-CM | POA: Diagnosis not present

## 2020-01-18 DIAGNOSIS — N3000 Acute cystitis without hematuria: Secondary | ICD-10-CM | POA: Diagnosis not present

## 2020-01-18 DIAGNOSIS — A419 Sepsis, unspecified organism: Secondary | ICD-10-CM | POA: Diagnosis not present

## 2020-01-18 DIAGNOSIS — R6889 Other general symptoms and signs: Secondary | ICD-10-CM | POA: Diagnosis not present

## 2020-01-18 DIAGNOSIS — E785 Hyperlipidemia, unspecified: Secondary | ICD-10-CM | POA: Diagnosis not present

## 2020-01-18 DIAGNOSIS — E538 Deficiency of other specified B group vitamins: Secondary | ICD-10-CM | POA: Diagnosis not present

## 2020-01-18 DIAGNOSIS — Z7901 Long term (current) use of anticoagulants: Secondary | ICD-10-CM | POA: Diagnosis not present

## 2020-01-18 LAB — PROTIME-INR
INR: 2.2 — ABNORMAL HIGH (ref 0.8–1.2)
Prothrombin Time: 24.1 seconds — ABNORMAL HIGH (ref 11.4–15.2)

## 2020-01-18 IMAGING — MG DIGITAL SCREENING BILATERAL MAMMOGRAM WITH TOMO AND CAD
8 series · 8 of 24 positions shown · non-contrast
Comparison: Previous exam(s).

CLINICAL DATA: Screening.

EXAM:
DIGITAL SCREENING BILATERAL MAMMOGRAM WITH TOMO AND CAD

[L CC synth-2D]
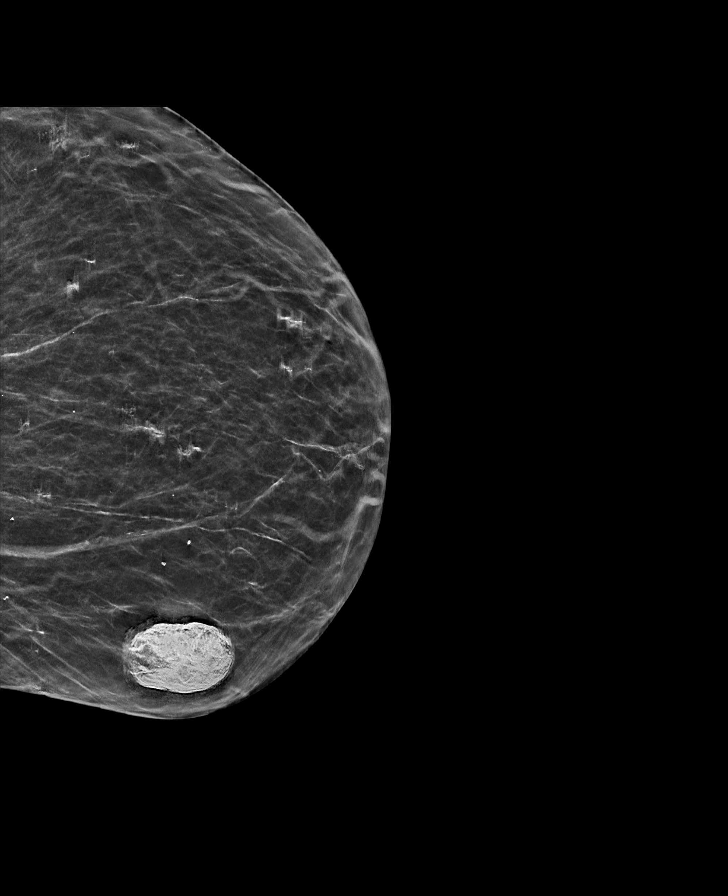

[L MLO synth-2D]
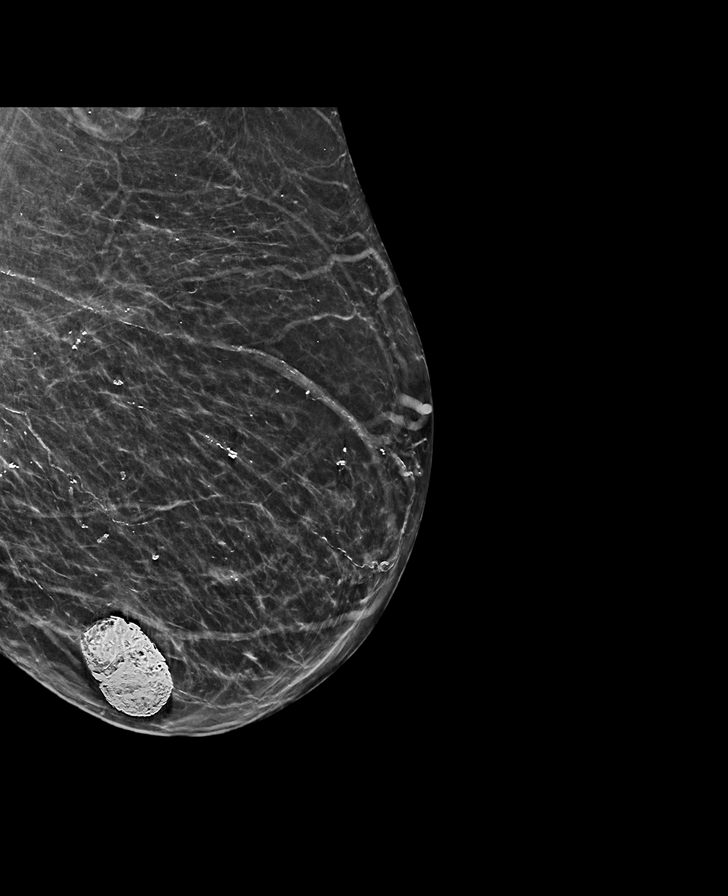

[R CC synth-2D]
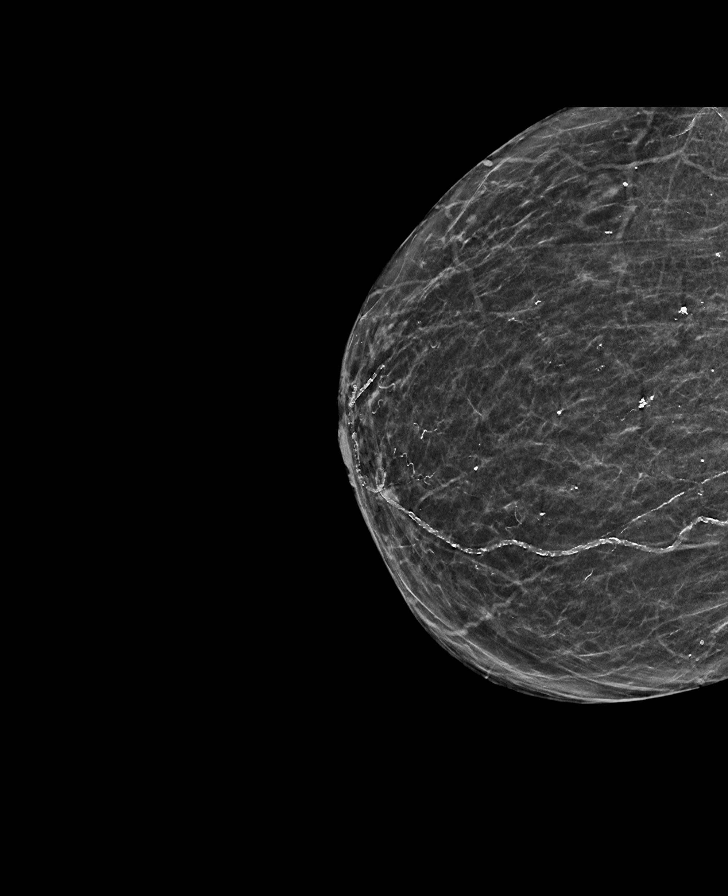

[R MLO synth-2D]
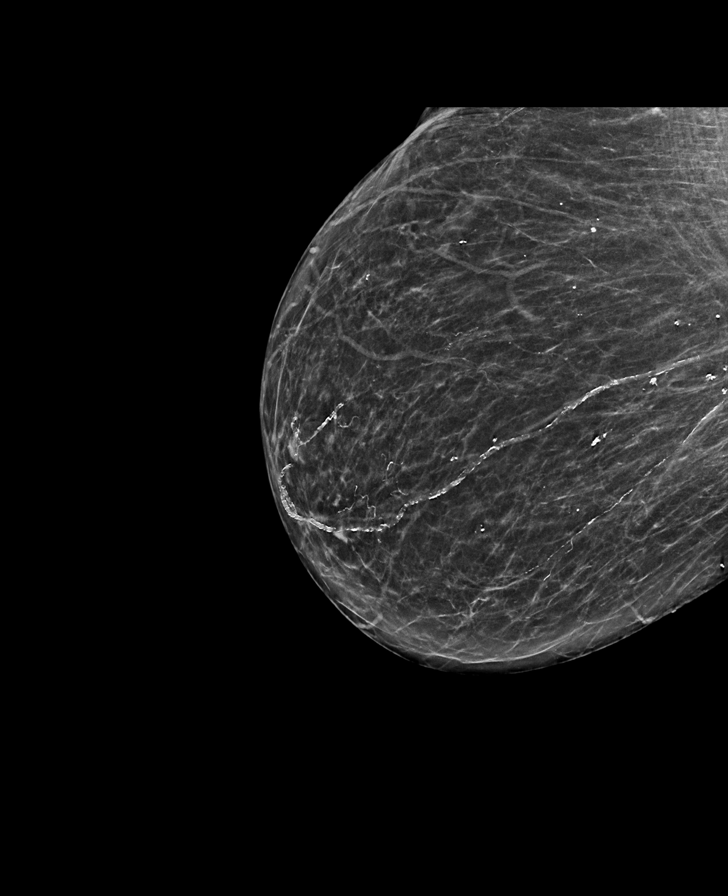

[R CC tomo · tomo slice 27/52.0]
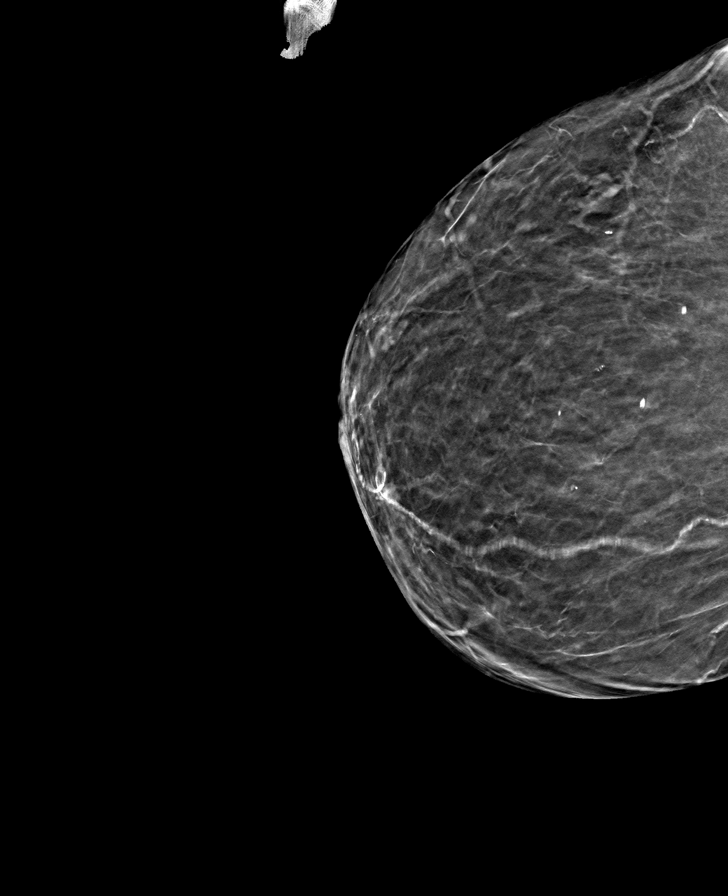

[L MLO tomo · tomo slice 31/60.0]
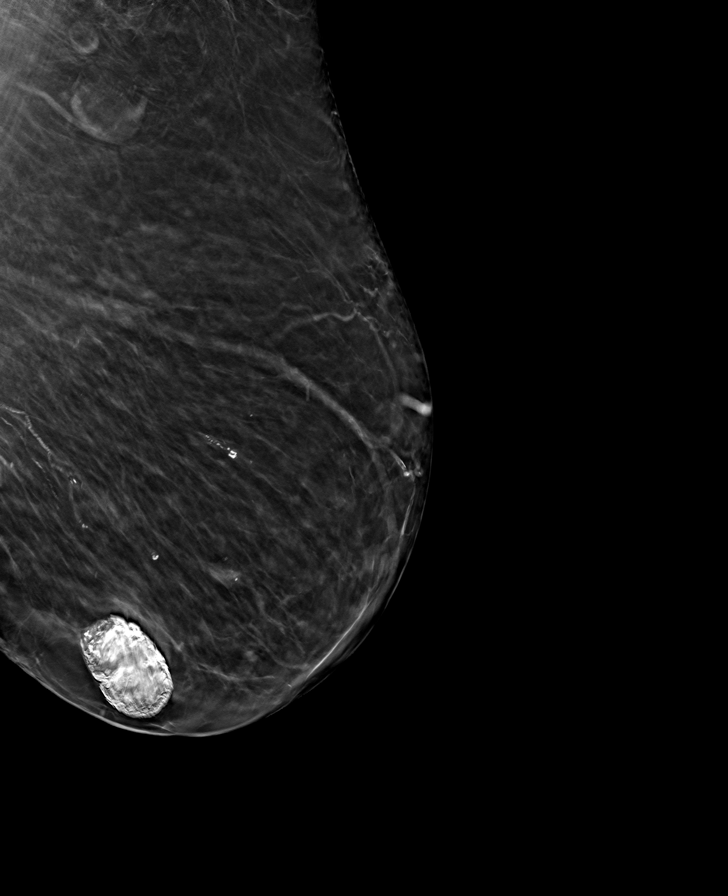

[L CC tomo · tomo slice 28/55.0]
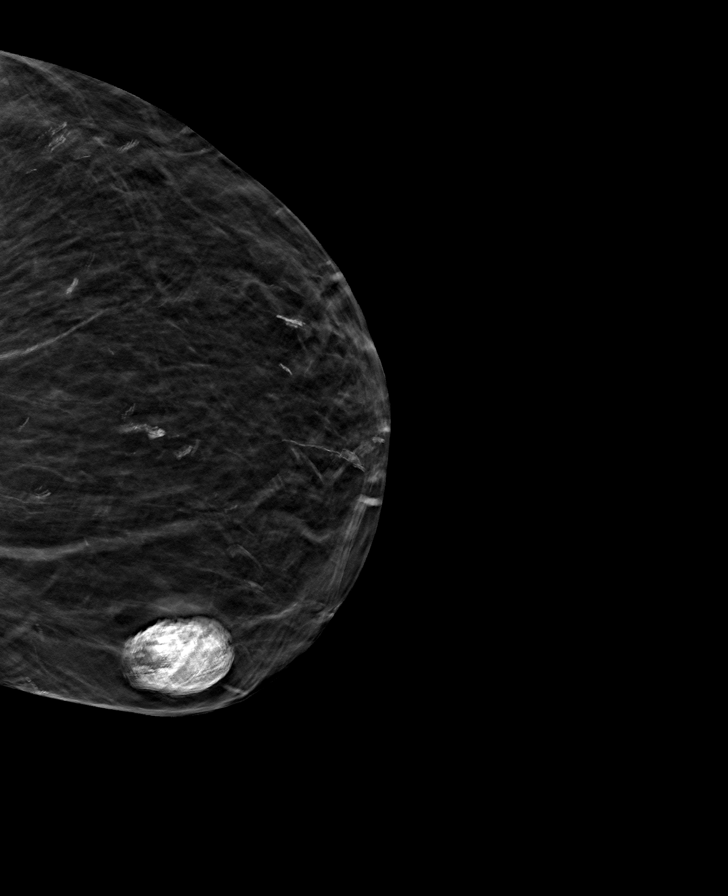

[R MLO tomo · tomo slice 29/58.0]
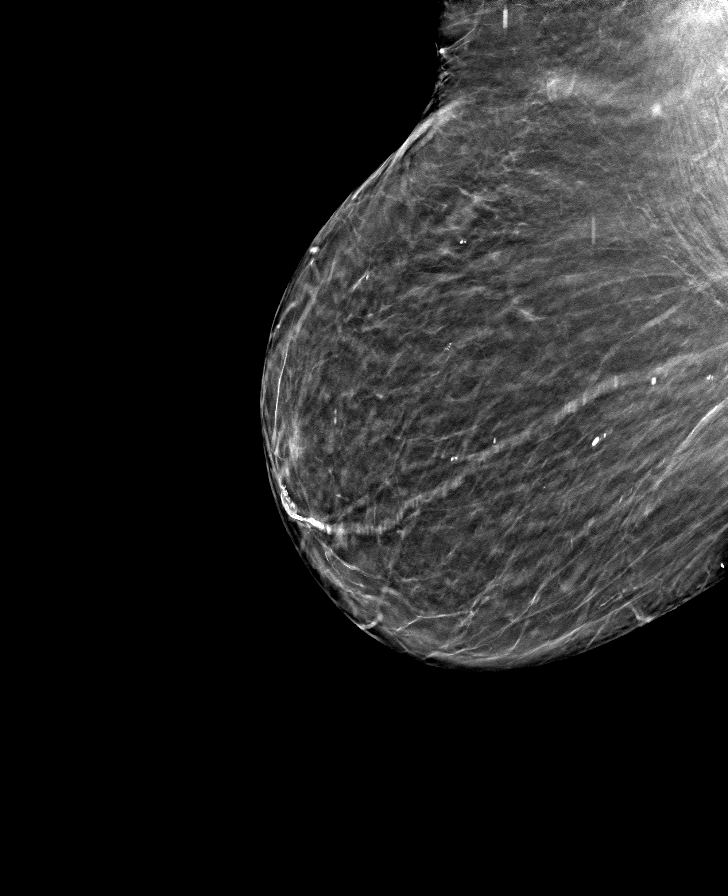

[8 of 24 positions shown; findings below may reference images not displayed]

ACR Breast Density Category b: There are scattered areas of
fibroglandular density.
FINDINGS: There are no findings suspicious for malignancy. Images were
processed with CAD.
IMPRESSION: No mammographic evidence of malignancy. A result letter of this
screening mammogram will be mailed directly to the patient.

RECOMMENDATION:
Screening mammogram in one year. (Code:[id number])

BI-RADS CATEGORY  1: Negative.

## 2020-01-18 MED ORDER — ASPIRIN 81 MG PO CHEW: 81.0000 mg | CHEWABLE_TABLET | Freq: Every day | ORAL | 0 refills | Status: DC

## 2020-01-18 MED ORDER — ASPIRIN 81 MG PO CHEW: 81.0000 mg | CHEWABLE_TABLET | Freq: Every day | ORAL | Status: DC

## 2020-01-18 MED ORDER — PHENYLEPHRINE HCL-NACL 10-0.9 MG/250ML-% IV SOLN
INTRAVENOUS | Status: AC
  Filled 2020-01-18: qty 250

## 2020-01-18 NOTE — Progress Notes (Addendum)
   Asked to see regarding anticoagulation recommendations. Ms. Veth is well-known to me and recently was referred for and underwent recent successful TAVR on 11/30/19. Unfortunately, she sufferred UGI bleed and has been on Warfarin and Plavix - INR was 5 on admission. She was given Vitamin K. Her afib is permanent and her CHADVASC score is high.   I agree with stopping Plavix at this time - recent evidence with TAVR suggests that aspirin alone may be sufficient. She will need to be back on Warfarin - currently her INR is 2.2. Would recommend starting aspirin 81 mg daily.  Will arrange for follow-up in our coumadin clinic for management - later this week.  Thanks for alerting me to this admission.  TIME SPENT WITH PATIENT: 15 minutes of direct patient care. More than 50% of that time was spent on coordination of care and counseling regarding anticoagulation management.   Pixie Casino, MD, [facility], [place] Director of the Advanced Lipid Disorders &  Cardiovascular Risk Reduction Clinic Diplomate of the American Board of Clinical Lipidology Attending Cardiologist  Direct Dial: [phone]  Fax: [phone]  Website:  [email or web address]

## 2020-01-18 NOTE — Progress Notes (Signed)
Patient being discharged to [place] at [facility], report given to [facility].  Patient transported via [facility]

## 2020-01-18 NOTE — TOC Progression Note (Signed)
Transition of Care [facility]) - Progression Note    Patient Details  Name: [name] MRN: [id number] Date of Birth: [birth year]  [facility] CM/SW Contact  [name], Juliann Pulse, RN Phone Number: 01/18/2020, 10:03 AM  Clinical Narrative:TC t dtr [place] [facility] await call back for bed offers.       Expected Discharge Plan: [place] Barriers to Discharge: No SNF bed  Expected Discharge Plan and Services Expected Discharge Plan: [place]   Discharge Planning Services: CM Consult   Living arrangements for the past 2 months: Single Family Home Expected Discharge Date: 01/17/20                                     Social Determinants of Health (SDOH) Interventions    Readmission Risk Interventions Readmission Risk Prevention Plan 12/01/2019  Post Dischage Appt Complete  Medication Screening Complete  Transportation Screening Complete  Some recent data might be hidden

## 2020-01-18 NOTE — Progress Notes (Signed)
PROGRESS NOTE    [name]  [name] DOB: April 23, 1933 DOA: 01/12/2020 PCP: [name], MD   Brief Narrative:  Patient is 86 year old female with history of permanent A. fib on Coumadin, coronary artery disease on Plavix, dementia, hypertension, hyperlipidemia, aortic stenosis, history of TAVR who presented from home with complaints of confusion, dehydration. She lives with her daughter and her daughter feels that she is not able to take care of her anymore.She was also complaining of abdominal discomfort on presentation. CT abdomen/pelvis did not show any acute findings. She was hypotensive on presentation with improved after being given  IV fluids. Found to have acute kidney injury, anemia with hemoglobin in the range of 8, leukocytosis. Chest x-ray did not show any acute process. Started on antibiotics for possible UTI but  stopped after urine culture did not show any growth. GI consulted for anemia with Hemoccult stool but due to her history of dementia, they did not recommend  any EGD or colonoscopy. Recommendedconservative management.Hemoglobin has remained stable in the range of 8 . PT/OT recommended skilled nursing facility .  We have advised to continue Coumadin if INR is less than 2, discontinue Plavix.  She needs to follow-up with her cardiologist and discuss on the need of continuation of anticoagulation because she has high risk of rebleeding in the future.  She is hemodynamically stable for discharge to skilled nursing facility as soon as bed is available.  Assessment & Plan:   Principal Problem:   Sepsis ([place]) Active Problems:   Hyperlipidemia   Essential hypertension   Persistent atrial fibrillation (HCC)   CAD (coronary artery disease)   Hypothyroidism   Dementia (HCC)   Severe aortic stenosis   S/P TAVR (transcatheter aortic valve replacement)   AKI (acute kidney injury) ([place])   Acute cystitis    Suspected sepsis secondary to urinary tract  infection:Ruled out.Presented with AKI, leukocytosis. Hypotensive on presentation. Complained of dysuria, abdominal discomfort on presentation. Procalcitonin, lactic acid normal. UA was not that suggestive of UTI. Blood cultures, urine culture sent,No growth.Antibiotics stopped.  Acute blood loss normocytic anemia/upper GI bleed: Her hemoglobin was in the range of 12 on 01/04/2020. Presented with hemoglobin in the range of 8. FOBT positive. INR elevated on presentation. She was on warfarin and Plavix at home. GI consulted. Not planning for EGD or colonoscopy due to her mental status, advanced age. Recommended Protonix. Continue to monitor CBC.Hbstable in the range of 8.No active bleeding at present.   Elevated INR: INR supratherapeutic in the range of 5 on presentation .Givea dose of vitamin K 5 mg once because of Hemoccult positive stool. INRin the range of 2 today.    AKI : Baseline creatinine is normal.Presented with AKI.resolved  Permanent A. fib: Currently rate is controlled. On Coumadin at home. Continue to monitor INR. Difficult situation regarding her continuation of Coumadin due to GI bleed.She needs to follow-up with her cardiologist as an outpatient.  Coronary artery disease: No chest pain at present. On Plavix, statin at home. We will discontinue Plavix permanently. Continue statin.  Hyperlipidemia:On Lipitor.  Hypothyroidism: Levothyroxine  History of severe aortic stenosis: Status post TAVR.  Advanced dementia:Oriented to self only. Confused at baseline. PT/OT evaluation done and recommended skilled nursing facility but family wants to take her home.  Home health arranged.  Hypertension:She was Hypotensive on arrival. She was on metoprolol, lisinopril, amlodipine at home .Metoprolol restarted.  Blood pressure stable without lisinopril and amlodipine.    DVT prophylaxis:SCD Code Status: Full code Family  Communication: called  daughter for update on 01/15/20 Disposition Plan: SNF soon as bed is available Consultants: GI  Procedures: None  Antimicrobials:  Anti-infectives (From admission, onward)   Start     Dose/Rate Route Frequency Ordered Stop   01/12/20 1700  Urelle (URELLE/URISED) 81 MG tablet 81 mg     1 tablet Oral 3 times daily 01/12/20 1649 01/15/20 0918   01/12/20 1445  cefTRIAXone (ROCEPHIN) 1 g in sodium chloride 0.9 % 100 mL IVPB  Status:  Discontinued     1 g 200 mL/hr over 30 Minutes Intravenous Every 24 hours 01/12/20 1440 01/14/20 1244      Subjective:  Patient seen and examined at the bedside this morning.  Hemodynamically stable.  She was sleeping during my evaluation.  I have talked to Dr. Debara Pickett about her situation about anticoagulation.  Dr. Debara Pickett will put a note today regarding the recommendation.  Objective: Vitals:   01/17/20 0610 01/17/20 1322 01/17/20 2133 01/18/20 0424  BP: 137/65 (!) 132/57 128/63 116/60  Pulse: 89 83 99 83  Resp: (!) 22 18 18 18   Temp: 97.6 F (36.4 C) 97.7 F (36.5 C) (!) 97.5 F (36.4 C) (!) 97.3 F (36.3 C)  TempSrc: Oral Oral Oral Oral  SpO2: 99% 97% 97% 98%  Weight:      Height:        Intake/Output Summary (Last 24 hours) at 01/18/2020 1019 Last data filed at 01/18/2020 [id number] Gross per 24 hour  Intake 640 ml  Output 500 ml  Net 140 ml   Filed Weights   01/13/20 1515  Weight: 75.2 kg    Examination:    General exam: Comfortable, sleeping Respiratory system: Bilateral clear Cardiovascular system: A. fib  gastrointestinal system: Abdomen is nondistended, soft and nontender.. Normal bowel sounds heard. Central nervous system: Sleeping, confused at baseline  extremities: No edema, no clubbing ,no cyanosis Skin: No rashes, lesions or ulcers,no icterus ,no pallor     Data Reviewed: I have personally reviewed following labs and imaging studies  CBC: Recent Labs  Lab 01/12/20 1200 01/13/20 0411 01/14/20 0427 01/15/20 0540  01/17/20 0447  WBC 13.2* 8.8 8.3 6.4 7.1  NEUTROABS 11.2*  --   --  4.9 5.1  HGB 8.9* 8.0* 8.6* 8.4* 7.9*  HCT 27.9* 26.0* 28.1* 26.7* 25.2*  MCV 90.9 92.5 94.9 92.7 94.4  PLT 169 152 160 159 [id number]   Basic Metabolic Panel: Recent Labs  Lab 01/12/20 1200 01/13/20 0411 01/14/20 0427 01/15/20 0540  NA 134* 137 141 140  K 4.0 3.6 4.0 3.6  CL 100 107 111 113*  CO2 23 18* 20* 21*  GLUCOSE 79 79 91 100*  BUN 50* 45* 29* 17  CREATININE 2.65* 2.33* 1.38* 1.01*  CALCIUM 8.0* 7.7* 7.8* 7.6*   GFR: Estimated Creatinine Clearance: 39.3 mL/min (A) (by C-G formula based on SCr of 1.01 mg/dL (H)). Liver Function Tests: No results for input(s): AST, ALT, ALKPHOS, BILITOT, PROT, ALBUMIN in the last 168 hours. No results for input(s): LIPASE, AMYLASE in the last 168 hours. No results for input(s): AMMONIA in the last 168 hours. Coagulation Profile: Recent Labs  Lab 01/14/20 0427 01/15/20 0540 01/16/20 0456 01/17/20 0447 01/18/20 0428  INR 3.1* 3.2* 3.5* 2.3* 2.2*   Cardiac Enzymes: No results for input(s): CKTOTAL, CKMB, CKMBINDEX, TROPONINI in the last 168 hours. BNP (last 3 results) No results for input(s): PROBNP in the last 8760 hours. HbA1C: No results for input(s): HGBA1C in the last 72 hours. CBG:  No results for input(s): GLUCAP in the last 168 hours. Lipid Profile: No results for input(s): CHOL, HDL, LDLCALC, TRIG, CHOLHDL, LDLDIRECT in the last 72 hours. Thyroid Function Tests: No results for input(s): TSH, T4TOTAL, FREET4, T3FREE, THYROIDAB in the last 72 hours. Anemia Panel: No results for input(s): VITAMINB12, FOLATE, FERRITIN, TIBC, IRON, RETICCTPCT in the last 72 hours. Sepsis Labs: Recent Labs  Lab 01/12/20 1617  PROCALCITON <0.10  LATICACIDVEN 1.8    Recent Results (from the past 240 hour(s))  SARS CORONAVIRUS 2 (TAT 6-24 HRS) Nasopharyngeal Nasopharyngeal Swab     Status: None   Collection Time: 01/12/20  2:13 PM   Specimen: Nasopharyngeal Swab  Result  Value Ref Range Status   SARS Coronavirus 2 NEGATIVE NEGATIVE Final    Comment: (NOTE) SARS-CoV-2 target nucleic acids are NOT DETECTED. The SARS-CoV-2 RNA is generally detectable in upper and lower respiratory specimens during the acute phase of infection. Negative results do not preclude SARS-CoV-2 infection, do not rule out co-infections with other pathogens, and should not be used as the sole basis for treatment or other patient management decisions. Negative results must be combined with clinical observations, patient history, and epidemiological information. The expected result is Negative. Fact Sheet for Patients: SugarRoll.be Fact Sheet for Healthcare Providers: https://www.woods-mathews.com/ This test is not yet approved or cleared by the [place] FDA and  has been authorized for detection and/or diagnosis of SARS-CoV-2 by FDA under an Emergency Use Authorization (EUA). This EUA will remain  in effect (meaning this test can be used) for the duration of the COVID-19 declaration under Section 56 4(b)(1) of the Act, 21 U.S.C. section 360bbb-3(b)(1), unless the authorization is terminated or revoked sooner. Performed at [facility], [place] [address], [place], [place] [address]   Urine culture     Status: None   Collection Time: 01/12/20  3:53 PM   Specimen: Urine, Clean Catch  Result Value Ref Range Status   Specimen Description   Final    URINE, CLEAN CATCH Performed at [facility], [place] [address], [place], [place] [address]    Special Requests   Final    NONE Performed at [facility], [place] [address], [place], [place] [address]    Culture   Final    NO GROWTH Performed at [facility], [place] [address] [place], [place] [address]    Report Status 01/13/2020 FINAL  Final  Culture, blood (routine x 2)     Status: None   Collection Time: 01/12/20  4:17 PM   Specimen: BLOOD  LEFT HAND  Result Value Ref Range Status   Specimen Description   Final    BLOOD LEFT HAND Performed at [place] [address] [place], [place] [address]    Special Requests   Final    BOTTLES DRAWN AEROBIC AND ANAEROBIC Blood Culture adequate volume Performed at [place] [address], [place], [place] [address]    Culture   Final    NO GROWTH 5 DAYS Performed at [facility], [place] [address] [place], [place] [address]    Report Status 01/17/2020 FINAL  Final  Culture, blood (routine x 2)     Status: None   Collection Time: 01/12/20  4:17 PM   Specimen: BLOOD  Result Value Ref Range Status   Specimen Description   Final    BLOOD LEFT ANTECUBITAL Performed at [facility] [place] [address] [place],  [place] [address]    Special Requests   Final    BOTTLES DRAWN AEROBIC AND ANAEROBIC Blood Culture adequate volume Performed at [place] [address], [place], [place] [address]    Culture   Final    NO GROWTH 5 DAYS Performed at [facility], [place] [address], [place], [place] [address]    Report Status 01/17/2020 FINAL  Final  Urine culture     Status: None   Collection Time: 01/12/20  4:50 PM   Specimen: Urine, Clean Catch  Result Value Ref Range Status   Specimen Description   Final    URINE, CLEAN CATCH Performed at [facility], [place] [address], [place], [place] [address]    Special Requests   Final    NONE Performed at [facility], [place] [address], [place], [place] [address]    Culture   Final    NO GROWTH Performed at [facility], [place] [address] [place], [place] [address]    Report Status 01/14/2020 FINAL  Final  Blood Culture x 1     Status: None   Collection Time: 01/12/20  5:28 PM   Specimen: BLOOD LEFT ARM  Result Value Ref Range Status   Specimen Description   Final    BLOOD LEFT ARM Performed at [place] [address], [place], [place] [address]    Special Requests   Final    BOTTLES DRAWN AEROBIC AND ANAEROBIC Blood Culture adequate volume Performed at [place] [address], [place], [place] [address]    Culture   Final    NO GROWTH 5 DAYS Performed at [facility], [place] [address], [place], [place] [address]    Report Status 01/17/2020 FINAL  Final  SARS CORONAVIRUS 2 (TAT 6-24 HRS) Nasopharyngeal Nasopharyngeal Swab     Status: None   Collection Time: 01/17/20  2:21 PM   Specimen: Nasopharyngeal Swab  Result Value Ref Range Status   SARS Coronavirus 2 NEGATIVE NEGATIVE Final    Comment: (NOTE) SARS-CoV-2 target nucleic acids are NOT DETECTED. The SARS-CoV-2 RNA is generally detectable in upper and lower respiratory specimens during the acute phase of infection. Negative results do not preclude SARS-CoV-2 infection, do not rule out co-infections with other pathogens, and should not be used as the sole basis for treatment or other patient management decisions. Negative results must be combined with clinical observations, patient history, and epidemiological information. The expected result is Negative. Fact Sheet for Patients: SugarRoll.be Fact Sheet for Healthcare Providers: https://www.woods-mathews.com/ This test is not yet approved or cleared by the [place] FDA and  has been authorized for detection and/or diagnosis of SARS-CoV-2 by FDA under an Emergency Use Authorization (EUA). This EUA will remain  in effect (meaning this test can be used) for the duration of the COVID-19 declaration under Section 56 4(b)(1) of the Act, 21 U.S.C. section 360bbb-3(b)(1), unless the authorization is terminated or revoked sooner. Performed at [facility], [place] [address] [place], [place] [address]          Radiology Studies: No results found.      Scheduled Meds: .  atorvastatin  80 mg Oral Daily  . Chlorhexidine Gluconate Cloth  6 each Topical Daily  . cholecalciferol  1,000 Units Oral Daily  . levothyroxine  137 mcg Oral Q0600  . metoprolol tartrate  50 mg Oral BID  . pantoprazole  40 mg Oral  Daily  . cyanocobalamin  1,000 mcg Oral Daily   Continuous Infusions: . phenylephrine       LOS: 6 days    Time spent: 15 mins.More than 50% of that time was spent in counseling and/or coordination of care.      Shelly Coss, MD [facility] Pager [phone]  If 7PM-7AM, please contact night-coverage [email or web address] Password [facility] 01/18/2020, 10:19 AM

## 2020-01-18 NOTE — Care Management Important Message (Signed)
Important Message  Patient Details IM Letter given to Dessa Phi RN Case Manager to present to the Patient Name: [name] MRN: [id number] Date of Birth: May 18, 1933   Medicare Important Message Given:  Yes     Kerin Salen 01/18/2020, 2:10 PM

## 2020-01-18 NOTE — Progress Notes (Signed)
Occupational Therapy Treatment Patient Details Name: [name] MRN: [id number] DOB: 05-Oct-1933 Today's Date: 01/18/2020    History of present illness Patient is 86 year old female with history of permanent A. fib on Coumadin, coronary artery disease on Plavix, dementia, hypertension, hyperlipidemia, aortic stenosis, history of TAVR who presented from home with complaints of confusion, dehydration.  She lives with her daughter and her daughter feels that she is not able to take care of her anymore.She was also complaining of abdominal discomfort on presentation.  CT abdomen/pelvis did not show any acute findings.  She was hypotensive on presentation with improved after IV fluids.  Found to have acute kidney injury, anemia with hemoglobin in the range of 8, leukocytosis.  Chest x-ray did not show any acute process.  Started on antibiotics for possible UTI.  Culture sent.  GI consulted today for anemia with Hemoccult stool.   OT comments  Pt in bed upon arrival. Pt agreed to sitting EOB max A with increased time and effort. OTT assisted pt with grooming and self feeding sitting EOB x 8 minutes. Pt pleasantly confused, sleepy and did not want to eat anymore than a few bites of her lunch and was returned to supine bed . OT will continue to follow acutely  Follow Up Recommendations  SNF    Equipment Recommendations  None recommended by OT    Recommendations for Other Services      Precautions / Restrictions Precautions Precautions: Fall Restrictions Weight Bearing Restrictions: No       Mobility Bed Mobility Overal bed mobility: Needs Assistance Bed Mobility: Supine to Sit;Sit to Supine     Supine to sit: Mod assist Sit to supine: Max assist   General bed mobility comments: mod A to initiate LEs off EOB and to eleavte trunk, max A with LEs back onto bed. Pt sat EOB x 8 minutes to eat lunch (are very minimal)  Transfers                 General transfer comment: unable    Balance Overall balance assessment: Needs assistance Sitting-balance support: Single extremity supported Sitting balance-Leahy Scale: Fair                                     ADL either performed or assessed with clinical judgement   ADL Overall ADL's : Needs assistance/impaired Eating/Feeding: Minimal assistance;Sitting Eating/Feeding Details (indicate cue type and reason): pt sat EOB x 8 minutes and ate minimal lunch Grooming: Wash/dry hands;Wash/dry face;Min guard;Sitting                                 General ADL Comments: Pt agreed to sitting EOB with OT and grooming. Pt pleasantly confused, sleepy and did not want to eat anymore than a few bites of her lunch seated EOB     Vision Patient Visual Report: No change from baseline     Perception     Praxis      Cognition Arousal/Alertness: Lethargic Behavior During Therapy: WFL for tasks assessed/performed Overall Cognitive Status: No family/caregiver present to determine baseline cognitive functioning Area of Impairment: Orientation;Memory;Attention;Problem solving;Safety/judgement;Awareness                 Orientation Level: Disoriented to;Place;Time;Situation   Memory: Decreased recall of precautions;Decreased short-term memory   Safety/Judgement: Decreased awareness of safety;Decreased awareness of deficits  Problem Solving: Requires verbal cues;Requires tactile cues General Comments: Pt unaware she is in a hospital, continued to think different family members were present although no family there.        Exercises     Shoulder Instructions       General Comments      Pertinent Vitals/ Pain       Pain Assessment: No/denies pain Pain Score: 0-No pain Pain Intervention(s): Monitored during session  Home Living                                          Prior Functioning/Environment              Frequency  Min 2X/week        Progress  Toward Goals  OT Goals(current goals can now be found in the care plan section)  Progress towards OT goals: OT to reassess next treatment  Acute Rehab OT Goals Patient Stated Goal: did not state ADL Goals Pt Will Perform Eating: with min guard assist;with supervision;with set-up;sitting Pt Will Perform Grooming: with min guard assist;with supervision;with set-up;sitting Pt Will Perform Upper Body Dressing: with min guard assist;sitting Pt Will Transfer to Toilet: with min assist;with min guard assist;stand pivot transfer  Plan Discharge plan remains appropriate    Co-evaluation                 AM-PAC OT "6 Clicks" Daily Activity     Outcome Measure   Help from another person eating meals?: A Little Help from another person taking care of personal grooming?: A Little Help from another person toileting, which includes using toliet, bedpan, or urinal?: Total Help from another person bathing (including washing, rinsing, drying)?: Total Help from another person to put on and taking off regular upper body clothing?: A Lot Help from another person to put on and taking off regular lower body clothing?: Total 6 Click Score: 11    End of Session    OT Visit Diagnosis: Unsteadiness on feet (R26.81);Other abnormalities of gait and mobility (R26.89);Muscle weakness (generalized) (M62.81)   Activity Tolerance Patient limited by fatigue   Patient Left in bed;with call bell/phone within reach;with bed alarm set   Nurse Communication          Time: [id number] OT Time Calculation (min): 17 min  Charges: OT General Charges $OT Visit: 1 Visit OT Treatments $Therapeutic Activity: 8-22 mins     Britt Bottom 01/18/2020, 2:49 PM

## 2020-01-19 DIAGNOSIS — I4819 Other persistent atrial fibrillation: Secondary | ICD-10-CM | POA: Diagnosis not present

## 2020-01-19 DIAGNOSIS — E039 Hypothyroidism, unspecified: Secondary | ICD-10-CM | POA: Diagnosis not present

## 2020-01-19 DIAGNOSIS — E46 Unspecified protein-calorie malnutrition: Secondary | ICD-10-CM | POA: Diagnosis not present

## 2020-01-19 DIAGNOSIS — R0602 Shortness of breath: Secondary | ICD-10-CM | POA: Diagnosis not present

## 2020-01-19 DIAGNOSIS — D519 Vitamin B12 deficiency anemia, unspecified: Secondary | ICD-10-CM | POA: Diagnosis not present

## 2020-01-19 DIAGNOSIS — D649 Anemia, unspecified: Secondary | ICD-10-CM | POA: Diagnosis not present

## 2020-01-19 DIAGNOSIS — G309 Alzheimer's disease, unspecified: Secondary | ICD-10-CM | POA: Diagnosis not present

## 2020-01-19 DIAGNOSIS — R6889 Other general symptoms and signs: Secondary | ICD-10-CM | POA: Diagnosis not present

## 2020-01-23 ENCOUNTER — Other Ambulatory Visit: Payer: Self-pay | Admitting: Internal Medicine

## 2020-01-25 ENCOUNTER — Ambulatory Visit: Payer: Medicare Other | Admitting: Internal Medicine

## 2020-01-25 ENCOUNTER — Ambulatory Visit (INDEPENDENT_AMBULATORY_CARE_PROVIDER_SITE_OTHER): Payer: Medicare Other | Admitting: Pharmacist

## 2020-01-25 ENCOUNTER — Encounter: Payer: Self-pay | Admitting: Internal Medicine

## 2020-01-25 ENCOUNTER — Other Ambulatory Visit: Payer: Self-pay

## 2020-01-25 VITALS — BP 115/52 | HR 74 | Temp 97.5°F | Ht 62.0 in | Wt 175.0 lb

## 2020-01-25 DIAGNOSIS — Z952 Presence of prosthetic heart valve: Secondary | ICD-10-CM

## 2020-01-25 DIAGNOSIS — I5032 Chronic diastolic (congestive) heart failure: Secondary | ICD-10-CM | POA: Diagnosis not present

## 2020-01-25 DIAGNOSIS — Z5181 Encounter for therapeutic drug level monitoring: Secondary | ICD-10-CM | POA: Diagnosis not present

## 2020-01-25 DIAGNOSIS — I4819 Other persistent atrial fibrillation: Secondary | ICD-10-CM | POA: Diagnosis not present

## 2020-01-25 DIAGNOSIS — R319 Hematuria, unspecified: Secondary | ICD-10-CM | POA: Diagnosis not present

## 2020-01-25 DIAGNOSIS — K921 Melena: Secondary | ICD-10-CM | POA: Diagnosis not present

## 2020-01-25 DIAGNOSIS — N390 Urinary tract infection, site not specified: Secondary | ICD-10-CM | POA: Diagnosis not present

## 2020-01-25 DIAGNOSIS — D649 Anemia, unspecified: Secondary | ICD-10-CM | POA: Diagnosis not present

## 2020-01-25 DIAGNOSIS — R6889 Other general symptoms and signs: Secondary | ICD-10-CM | POA: Diagnosis not present

## 2020-01-25 LAB — POCT INR: INR: 1.3 — AB (ref 2.0–3.0)

## 2020-01-25 NOTE — Patient Instructions (Signed)
Resume warfarin dose at 2 tablets daily except for 1 tablet every Monday, Wednesday and Friday.  Next INR Feb/1 . Please call [phone], for any changes in medications or up coming procedures.

## 2020-01-25 NOTE — Patient Instructions (Addendum)
Medication Instructions:  STOP aspirin START warfarin as directed  *If you need a refill on your cardiac medications before your next appointment, please call your pharmacy*  Follow-Up: At [facility], you and your health needs are our priority.  As part of our continuing mission to provide you with exceptional heart care, we have created designated Provider Care Teams.  These Care Teams include your primary Cardiologist (physician) and Advanced Practice Providers (APPs -  Physician Assistants and Nurse Practitioners) who all work together to provide you with the care you need, when you need it.  Your next appointment:  March 29, 2020 @ 11:15am

## 2020-01-25 NOTE — Progress Notes (Signed)
OFFICE NOTE  Chief Complaint:  Follow-up hospitalization for anemia  Primary Care Physician: Carolyn Frizzle, MD  HPI:  [name] is an 86 yo female with a prior history of hypertension, hyperlipidemia, hypothyroidism, sleep apnea, and osteoporosis in the setting of chronic steroid therapy. She was recently admitted to [facility] with severe chest and back discomfort. EKG showed rate-controlled atrial fibrillation which was a new diagnosis. She ruled in for MI. Echo showed normal LV function. Cath revealed an occluded obtuse marginal branch of the left circumflex with other wise nonobstructive disease and normal LV function. The obtuse marginal was successfully stented using a 2.75 x 16 mm Rebel BMS. She did well postprocedure. With new onset A. fib, she was placed on Coumadin with a plan to continue aspirin, Plavix, and warfarin for 1 month and then to discontinue aspirin at that time.  Since discharge, she has done well. She has not had any chest pain or dyspnea. She says that she has been taking it fairly easy because she was.  She denies chest pain, palpitations, dyspnea, pnd, orthopnea, n, v, dizziness, syncope, edema, weight gain, or early satiety.  Right groin catheterization site has been healing well. She was noted to have significant tibial infection with sores under both breasts. She was given Diflucan in the hospital but no further treatment was initiated. She also had new onset atrial fibrillation and has been on warfarin. There've been no attempts at cardioversion.  [name] returns today for follow-up. She is in persistent A. fib. She's currently on Plavix and warfarin. I reviewed her INRs which are been followed at [facility] and they indicate that she's been subtherapeutic several times. We cannot continue with cardioversion at this time. I've asked Erasmo Downer, our anticoagulation pharmacist at [facility] to possibly follow her for the next month to try to get her INR  therapeutic. She continues to feel somewhat fatigued and may benefit from cardioversion.   I head pleasure seeing [name] back in the office today. She recently underwent elective cardioversion by my partner Dr. Oval Linsey. Unfortunately this was unsuccessful and she remains in atrial fibrillation. She says however she does feel stronger and she is asymptomatic at this time. She's had resolution of her candidal infection under her breasts with a treatment of a topical anti-fungal. INR is mildly supratherapeutic.  10/03/2016  [name] returns today for follow-up. Recently she had an upper respiratory infection and blood pressure was noted to be elevated. This is mostly resolved however blood pressure still is elevated and heart rate is in the upper 80s. I cut back her beta blocker in the past however it looks like she may need to be on the full dose now. In addition she has allowed her aortic stenosis murmur today. Her last echo was in February 2016. She reports some fatigue however is basically inactive at home. I have encouraged exercise.  10/08/2017  [name] was seen today in follow-up. She recent saw primary care provider and he felt that she was doing pretty well. She's had some memory loss. Overall though she denied any chest pain or worsening shortness of breath. She has long-standing persistent not permanent A. fib. Her INRs have been stable. She has a history of sleep apnea on CPAP. Blood pressure is well controlled. Recently a lipid profile showed LDL-C at goal of 45. She also has a history of aortic stenosis. Her last echo in 2017 showed progression of her aortic valve disease to moderate  aortic stenosis. She reports no symptoms associated with this. She uses a walker but denies any recent falls at home.  10/08/2018  [name] returns today for follow-up.  He remains in A. fib at 84 and has had fairly stable INR.  Recently 2.6 on October 1.  She had labs as well which showed total  cholesterol 116, HDL 41, LDL 53 and triglycerides 134.  Blood pressure is actually somewhat low today at 108/70.  Reports some fatigue although has no chest pain or shortness of breath.  She is known to have moderate aortic stenosis her last echo in October 2018 with EF of 60 to 65%.  10/18/2019  [name] returns today with her sister.  She reports that recently she has had more fatigue and shortness of breath with exertion.  Repeat echo to look for severe aortic stenosis was performed on October 04, 2019.  LVEF was 65% with mild LVH.  There is mild to moderately enlarged left atrial size and she has been noted to be in A. fib.  Her aortic valve is now severely stenotic with a mean gradient of 40.5 mmHg and peak gradient of 60.2 mmHg.  Measures 0.69 cm.  I discussed the findings with her and her sister today and she was upset.  I do think her reaction was a little over exaggerated may be due to her progressive dementia.  According to her daughter this has become more significant.  In fact recently there were some notes in the chart saying that she may need placement however according to the daughter today she said that was not the case.   01/25/2020  [name] returns today for follow-up after recent hospitalization at [facility] for anemia.  Her hemoglobin came down to 7 from 12.  There was suspicion for GI bleed as he had reported dark stools.  Apparently GI did not feel that she was a good candidate for work-up and recommended empiric PPI and holding her anticoagulation.  Initially she was noted to have mildly supratherapeutic INR at 5.  She was given reversal for this.  I was contacted regarding recommendations for her Plavix which was discontinued and warfarin which was held at discharge.  She was continued on low-dose aspirin.  Since discharge she has been at the [place] home.  There have been no further recurrent events of dark stools.  Due to her dementia she cannot adequately express her energy  level.  Of note, she recently underwent TAVR which was uneventful and had an excellent result, but did not report any improvement in her symptoms after the procedure.  PMHx:  Past Medical History:  Diagnosis Date  . Atrial fibrillation, persistent (HCC)    Atrial fibrillation  . CAD (coronary artery disease)    a. 01/2015 NSTEMI/PCI: LM nl, LAD 30p, LCX nondom, 30-40p, 50d, OM1 100p (2.75x16 Rebel BMS), RCA mild diff plaque, EF 55%.  . Cancer ([place])   . Candida infection, disseminated ([place])   . Dementia ([place])   . Hyperlipidemia   . Hypertension   . Hypothyroid   . Osteoporosis   . S/P TAVR (transcatheter aortic valve replacement)    s/p TAVR with 66mm Edwards S3U THV via the TF approach  . Severe aortic stenosis   . Sleep apnea 04/2012   Mild/ AHI 10/CPAP 7cm h2o w/2 L o2    Past Surgical History:  Procedure Laterality Date  . CARDIAC CATHETERIZATION  11/10/2019   RIGHT/LEFT HEART CATH AND CORONARY ANGIOGRAPHY   . CARDIOVERSION N/A  08/22/2015   Procedure: CARDIOVERSION;  Surgeon: Skeet Latch, MD;  Location: [facility];  Service: Cardiovascular;  Laterality: N/A;  . JOINT REPLACEMENT Left    knee  . KNEE SURGERY    . LEFT HEART CATHETERIZATION WITH CORONARY ANGIOGRAM N/A 02/20/2015   Procedure: LEFT HEART CATHETERIZATION WITH CORONARY ANGIOGRAM;  Surgeon: Jolaine Artist, MD;  Location: [facility] CATH LAB;  Service: Cardiovascular;  Laterality: N/A;  . PERCUTANEOUS CORONARY STENT INTERVENTION (PCI-S)  02/20/2015   Procedure: PERCUTANEOUS CORONARY STENT INTERVENTION (PCI-S);  Surgeon: Jolaine Artist, MD;  Location: [facility] CATH LAB;  Service: Cardiovascular;;  OM1  (2.75/63mm Rebel)  . RIGHT/LEFT HEART CATH AND CORONARY ANGIOGRAPHY N/A 11/10/2019   Procedure: RIGHT/LEFT HEART CATH AND CORONARY ANGIOGRAPHY;  Surgeon: Burnell Blanks, MD;  Location: [place] CV LAB;  Service: Cardiovascular;  Laterality: N/A;  . TEE WITHOUT CARDIOVERSION N/A 11/30/2019   Procedure:  TRANSESOPHAGEAL ECHOCARDIOGRAM (TEE);  Surgeon: Burnell Blanks, MD;  Location: [place];  Service: Open Heart Surgery;  Laterality: N/A;  . TONSILLECTOMY    . TRANSCATHETER AORTIC VALVE REPLACEMENT, TRANSFEMORAL N/A 11/30/2019   Procedure: TRANSCATHETER AORTIC VALVE REPLACEMENT, TRANSFEMORAL;  Surgeon: Burnell Blanks, MD;  Location: [facility];  Service: Open Heart Surgery;  Laterality: N/A;    FAMHx:  Family History  Problem Relation Age of Onset  . Coronary artery disease Father   . Diabetes Father   . Stroke Mother   . Arthritis Mother     SOCHx:   reports that she quit smoking about 24 years ago. She has never used smokeless tobacco. She reports that she does not drink alcohol or use drugs.  ALLERGIES:  Allergies  Allergen Reactions  . Fosamax [Alendronate Sodium] Other (See Comments)    Unknown per daughter    ROS: Review of systems not obtained due to patient factors.  HOME MEDS: Current Outpatient Medications  Medication Sig Dispense Refill  . ALPRAZolam (XANAX) 0.5 MG tablet Take 1 tablet (0.5 mg total) by mouth at bedtime as needed for sleep. 30 tablet 0  . aspirin 81 MG chewable tablet Chew 1 tablet (81 mg total) by mouth daily. 30 tablet 0  . atorvastatin (LIPITOR) 80 MG tablet Take 80 mg by mouth daily.    . Calcium Citrate (CITRACAL PO) Take 1 tablet by mouth 2 (two) times daily.    . cholecalciferol (VITAMIN D) 1000 UNITS tablet Take 1,000 Units by mouth daily.    . cyanocobalamin 1000 MCG tablet Take 1,000 mcg by mouth daily.     Marland Kitchen levothyroxine (SYNTHROID) 137 MCG tablet Take 137 mcg by mouth daily before breakfast.    . metoprolol tartrate (LOPRESSOR) 50 MG tablet Take 50 mg by mouth 2 (two) times daily.    . pantoprazole (PROTONIX) 40 MG tablet Take 1 tablet (40 mg total) by mouth daily. 30 tablet 1  . risperiDONE (RISPERDAL) 0.5 MG tablet Take 0.5 mg by mouth at bedtime.    . traMADol (ULTRAM) 50 MG tablet Take 1 tablet (50 mg total) by mouth every  8 (eight) hours as needed. 30 tablet 0   Current Facility-Administered Medications  Medication Dose Route Frequency Provider Last Rate Last Admin  . denosumab (PROLIA) injection 60 mg  60 mg Subcutaneous Q6 months Carolyn Frizzle, MD   60 mg at 11/29/19 [id number]    LABS/IMAGING: No results found for this or any previous visit (from the past 48 hour(s)). No results found.  WEIGHTS: Wt Readings from Last 3 Encounters:  01/25/20  175 lb (79.4 kg)  01/13/20 165 lb 12.6 oz (75.2 kg)  01/04/20 166 lb (75.3 kg)    VITALS: BP (!) 115/52   Pulse 74   Temp (!) 97.5 F (36.4 C)   Ht 5\' 2"  (1.575 m)   Wt 175 lb (79.4 kg)   BMI 32.01 kg/m   EXAM: General appearance: alert and no distress Neck: no carotid bruit, no JVD and thyroid not enlarged, symmetric, no tenderness/mass/nodules Lungs: clear to auscultation bilaterally Heart: regular rate and rhythm, S1, S2 normal and systolic murmur: early systolic 2/6, crescendo at 2nd right intercostal space Abdomen: soft, non-tender; bowel sounds normal; no masses,  no organomegaly Extremities: extremities normal, atraumatic, no cyanosis or edema Pulses: 2+ and symmetric Skin: Skin color, texture, turgor normal. No rashes or lesions Neurologic: Grossly normal Psych: Pleasant  EKG: Deferred  ASSESSMENT: 1. Recent probable GI bleed with symptomatic anemia 2. Supratherapeutic INR on warfarin 3. NSTEMI-status post bare metal stent to obtuse marginal 4. Persistent atrial fibrillation -  Failed cardioversion, ?symptomatic - CHADVASC 5 5. Dyslipidemia 6. Hypertension 7. Severe symptomatic aortic stenosis -status post TAVR 23 mm sapient 3 (11/30/2019) 8. Dementia  PLAN: 1.   [name] was recently hospitalized with probable GI bleed and symptomatic anemia.  Her INR was supratherapeutic and reversed.  Today her INR was rechecked and was 1.3.  Instructions were provided by our pharmacist Racquel as to warfarin management to target INR from 2-2.5.   I instructed her to hold aspirin and Plavix indefinitely.  I reviewed this with Nell Range, PA-C, who represents the multidisciplinary valve clinic and she is in agreement.  Follow-up in 6 months or sooner as necessary.  Pixie Casino, MD, [facility], [place] Director of the Advanced Lipid Disorders &  Cardiovascular Risk Reduction Clinic Diplomate of the American Board of Clinical Lipidology Attending Cardiologist  Direct Dial: [phone]  Fax: [phone]  Website:  [email or web address] 01/25/2020, 3:52 PM

## 2020-01-26 DIAGNOSIS — R319 Hematuria, unspecified: Secondary | ICD-10-CM | POA: Diagnosis not present

## 2020-01-26 DIAGNOSIS — R6889 Other general symptoms and signs: Secondary | ICD-10-CM | POA: Diagnosis not present

## 2020-01-26 DIAGNOSIS — Z79899 Other long term (current) drug therapy: Secondary | ICD-10-CM | POA: Diagnosis not present

## 2020-01-26 DIAGNOSIS — N390 Urinary tract infection, site not specified: Secondary | ICD-10-CM | POA: Diagnosis not present

## 2020-02-11 ENCOUNTER — Encounter (INDEPENDENT_AMBULATORY_CARE_PROVIDER_SITE_OTHER): Payer: Self-pay

## 2020-02-11 ENCOUNTER — Other Ambulatory Visit: Payer: Self-pay

## 2020-02-11 ENCOUNTER — Ambulatory Visit: Payer: Medicare Other | Admitting: *Deleted

## 2020-02-11 DIAGNOSIS — Z5181 Encounter for therapeutic drug level monitoring: Secondary | ICD-10-CM

## 2020-02-11 DIAGNOSIS — I4819 Other persistent atrial fibrillation: Secondary | ICD-10-CM | POA: Diagnosis not present

## 2020-02-11 LAB — POCT INR: INR: 2.2 (ref 2.0–3.0)

## 2020-02-11 NOTE — Patient Instructions (Signed)
Description   Continue taking the dose you have been taking which is 2mg  daily. Recheck INR in 1 week. Please call [phone], for any changes in medications or up coming procedures.

## 2020-02-14 ENCOUNTER — Encounter: Payer: Self-pay | Admitting: Family Medicine

## 2020-02-14 ENCOUNTER — Ambulatory Visit (INDEPENDENT_AMBULATORY_CARE_PROVIDER_SITE_OTHER): Payer: Medicare Other | Admitting: Family Medicine

## 2020-02-14 ENCOUNTER — Other Ambulatory Visit: Payer: Self-pay

## 2020-02-14 VITALS — BP 122/80 | HR 74 | Temp 96.9°F | Resp 18 | Ht <= 58 in

## 2020-02-14 DIAGNOSIS — F0151 Vascular dementia with behavioral disturbance: Secondary | ICD-10-CM

## 2020-02-14 DIAGNOSIS — Z952 Presence of prosthetic heart valve: Secondary | ICD-10-CM | POA: Diagnosis not present

## 2020-02-14 DIAGNOSIS — K922 Gastrointestinal hemorrhage, unspecified: Secondary | ICD-10-CM | POA: Diagnosis not present

## 2020-02-14 DIAGNOSIS — N179 Acute kidney failure, unspecified: Secondary | ICD-10-CM

## 2020-02-14 DIAGNOSIS — F01518 Vascular dementia, unspecified severity, with other behavioral disturbance: Secondary | ICD-10-CM

## 2020-02-14 DIAGNOSIS — I4819 Other persistent atrial fibrillation: Secondary | ICD-10-CM | POA: Diagnosis not present

## 2020-02-14 NOTE — Progress Notes (Signed)
Subjective:    Patient ID: [name], female    DOB: [birth year], 86 y.o.   MRN: [id number]  HPI  After I last saw the patient she was taken to the hospital and admitted due to dehydration/GI bleed and failure to thrive:  Admit date: 01/12/2020 Discharge date: 01/18/20 Admitted From: Home Disposition:  SNF  Discharge Condition:Stable CODE STATUS:FULL Diet recommendation: Heart Healthy   Brief/Interim Summary:  Patient is 86 year old female with history of permanent A. fib on Coumadin, coronary artery disease on Plavix, dementia, hypertension, hyperlipidemia, aortic stenosis, history of TAVR who presented from home with complaints of confusion, dehydration. She lives with her daughter and her daughter feels that she is not able to take care of her anymore.She was also complaining of abdominal discomfort on presentation. CT abdomen/pelvis did not show any acute findings. She was hypotensive on presentation with improved after being given  IV fluids. Found to have acute kidney injury, anemia with hemoglobin in the range of 8, leukocytosis. Chest x-ray did not show any acute process. Started on antibiotics for possible UTI but  stopped after urine culture did not show any growth. GI consulted for anemia with Hemoccult stool but due to her history of dementia, they did not recommend  any EGD or colonoscopy. Recommendedconservative management.Hemoglobin has remained stable in the range of 7-8 . PT/OT recommended skilled nursing facility .  Coumadin and plavix will be discontinued until she is seen by her cardiologist as an outpatient. Started on aspirin.   She is hemodynamically stable for discharge to skilled nursing facility .  Following problems were addressed during her hospitalization:  Suspected sepsis secondary to urinary tract infection:Ruled out.Presented with AKI, leukocytosis. Hypotensive on presentation. Complained of dysuria, abdominal discomfort on  presentation. Procalcitonin, lactic acid normal. UA was not that suggestive of UTI. Blood cultures, urine culture sent,No growth.Antibiotics stopped.  Acute blood loss normocytic anemia/upper GI bleed: Her hemoglobin was in the range of 12 on 01/04/2020. Presented with hemoglobin in the range of 8. FOBT positive. INR elevated on presentation. She was on warfarin and Plavix at home. GI consulted. Not planning for EGD or colonoscopy due to her mental status, advanced age. Recommended Protonix. Continue to monitor CBC.Hbstable in the range of 7-8.No active bleeding at present.   Elevated INR: INR supratherapeutic in the range of 5 on presentation .Givea dose of vitamin K 5 mg once because of Hemoccult positive stool. INRin the range of 2 today.  Check INR in 2 to 3 days.  AKI : Baseline creatinine is normal.Presented with AKI.resolved  Permanent A. fib: Currently rate is controlled. On Coumadin at home. Continue to monitor INR. Difficult situation regarding her continuation of Coumadin due to GI bleed.She was seen by her cardiologist Dr Debara Pickett today and recommended to stop plavix and warfarin for now.Started on aspirin. She will be followed by her cardiologist as an outpatient .She might need to be started on coumadin at some point but her cardiologist will decide with outpatient follow up.  Coronary artery disease: No chest pain at present. On Plavix, statin at home. We will discontinue Plavix permanently. Continue statin.Started on aspirin.  Hyperlipidemia:On Lipitor.  Hypothyroidism: Levothyroxine  History of severe aortic stenosis: Status post TAVR.  Advanced dementia:Oriented to self only. Confused at baseline. PT/OT evaluation done and recommended skilled nursing facility but family wants to take her home.  Home health arranged.  Hypertension:She was Hypotensive on arrival. She was on metoprolol, lisinopril, amlodipine at home .Metoprolol  restarted.  Blood pressure  stable without lisinopril and amlodipine.    02/14/20 Here for follow up.  Has resumed coumadin under cardiologist's care.  Aspirin and plavix have stopped. INR was checked 2/12 and found to be 2.2.  Hgb has not been checked since discharge that I can see.  Was 7.9 on 1/18.  Patient is now back home living with her daughter.  She has been at home with her daughter now for the last 2 weeks.  Physical therapy and Occupational Therapy are going to start coming out to the home to work with the patient.  She has had her first Covid vaccination.  The daughter is already scheduled for second Covid vaccination and knows how to obtain this.  Since being back on the Coumadin, the patient denies any black or tarry stools.  She denies any abdominal pain.  She is taking Protonix.  Daughter has not seen any melena or hematochezia.  There is not any significant bleeding or bruising.  She does have a small scabbed ulcer on the medial left shin just above the medial malleolus.  This is not weeping or bleeding.  Is 1 cm in diameter.  However she does have +1 pitting edema up to her knees.  She denies any orthopnea or paroxysmal nocturnal dyspnea.  On examination she does not have any bibasilar crackles and does not appear to be fluid overloaded.  When I last saw the patient she was dehydrated.  She is not currently taking any type of diuretic.  I would be very hesitant to start a diuretic given her propensity to become dehydrated.  She is on Risperdal 0.5 mg twice daily.  Daughter states that this is working well to help manage her sundowning.  Since being home over the last 2 weeks she is not near as confused as she was previously.  Patient has reacted well to the Risperdal and seems to be doing better on this than she was on Seroquel.   Past Medical History:  Diagnosis Date  . Atrial fibrillation, persistent (HCC)    Atrial fibrillation  . CAD (coronary artery disease)    a. 01/2015  NSTEMI/PCI: LM nl, LAD 30p, LCX nondom, 30-40p, 50d, OM1 100p (2.75x16 Rebel BMS), RCA mild diff plaque, EF 55%.  . Cancer ([place])   . Candida infection, disseminated ([place])   . Dementia ([place])   . Hyperlipidemia   . Hypertension   . Hypothyroid   . Osteoporosis   . S/P TAVR (transcatheter aortic valve replacement)    s/p TAVR with 42mm Edwards S3U THV via the TF approach  . Severe aortic stenosis   . Sleep apnea 04/2012   Mild/ AHI 10/CPAP 7cm h2o w/2 L o2   Past Surgical History:  Procedure Laterality Date  . CARDIAC CATHETERIZATION  11/10/2019   RIGHT/LEFT HEART CATH AND CORONARY ANGIOGRAPHY   . CARDIOVERSION N/A 08/22/2015   Procedure: CARDIOVERSION;  Surgeon: Skeet Latch, MD;  Location: [facility];  Service: Cardiovascular;  Laterality: N/A;  . JOINT REPLACEMENT Left    knee  . KNEE SURGERY    . LEFT HEART CATHETERIZATION WITH CORONARY ANGIOGRAM N/A 02/20/2015   Procedure: LEFT HEART CATHETERIZATION WITH CORONARY ANGIOGRAM;  Surgeon: Jolaine Artist, MD;  Location[facility] LAB;  Service: Cardiovascular;  Laterality: N/A;  . PERCUTANEOUS CORONARY STENT INTERVENTION (PCI-S)  02/20/2015   Procedure: PERCUTANEOUS CORONARY STENT INTERVENTION (PCI-S);  Surgeon: Jolaine Artist, MD;  Location: [facility] CATH LAB;  Service: Cardiovascular;;  OM1  (2.75/70mm Rebel)  . RIGHT/LEFT HEART CATH  AND CORONARY ANGIOGRAPHY N/A 11/10/2019   Procedure: RIGHT/LEFT HEART CATH AND CORONARY ANGIOGRAPHY;  Surgeon: Burnell Blanks, MD;  Location: [facility] CV LAB;  Service: Cardiovascular;  Laterality: N/A;  . TEE WITHOUT CARDIOVERSION N/A 11/30/2019   Procedure: TRANSESOPHAGEAL ECHOCARDIOGRAM (TEE);  Surgeon: Burnell Blanks, MD;  Location: [facility];  Service: Open Heart Surgery;  Laterality: N/A;  . TONSILLECTOMY    . TRANSCATHETER AORTIC VALVE REPLACEMENT, TRANSFEMORAL N/A 11/30/2019   Procedure: TRANSCATHETER AORTIC VALVE REPLACEMENT, TRANSFEMORAL;  Surgeon: Burnell Blanks, MD;   Location: [facility];  Service: Open Heart Surgery;  Laterality: N/A;   Current Outpatient Medications on File Prior to Visit  Medication Sig Dispense Refill  . ALPRAZolam (XANAX) 0.5 MG tablet Take 1 tablet (0.5 mg total) by mouth at bedtime as needed for sleep. 30 tablet 0  . atorvastatin (LIPITOR) 80 MG tablet Take 80 mg by mouth daily.    . cholecalciferol (VITAMIN D) 1000 UNITS tablet Take 1,000 Units by mouth daily.    . cyanocobalamin 1000 MCG tablet Take 1,000 mcg by mouth daily.     Marland Kitchen levothyroxine (SYNTHROID) 137 MCG tablet Take 137 mcg by mouth daily before breakfast.    . metoprolol tartrate (LOPRESSOR) 50 MG tablet Take 50 mg by mouth 2 (two) times daily.    . Multiple Vitamins-Minerals (MULTIVITAMIN WITH MINERALS) tablet Take 1 tablet by mouth 2 (two) times daily.    . pantoprazole (PROTONIX) 40 MG tablet Take 1 tablet (40 mg total) by mouth daily. 30 tablet 1  . risperiDONE (RISPERDAL) 0.5 MG tablet Take 0.5 mg by mouth at bedtime.    . traMADol (ULTRAM) 50 MG tablet Take 1 tablet (50 mg total) by mouth every 8 (eight) hours as needed. 30 tablet 0  . warfarin (COUMADIN) 1 MG tablet Take 2 mg by mouth daily. Take 2 tablets daily or as directed by Anticoagulation Clinic.     Current Facility-Administered Medications on File Prior to Visit  Medication Dose Route Frequency Provider Last Rate Last Admin  . denosumab (PROLIA) injection 60 mg  60 mg Subcutaneous Q6 months [name], MD   60 mg at 11/29/19 0950    Allergies  Allergen Reactions  . Fosamax [Alendronate Sodium] Other (See Comments)    Unknown per daughter   Social History   Socioeconomic History  . Marital status: Married    Spouse name: Not on file  . Number of children: Not on file  . Years of education: Not on file  . Highest education level: Not on file  Occupational History  . Not on file  Tobacco Use  . Smoking status: Former Smoker    Quit date: 04/28/1995    Years since quitting: 24.8  .  Smokeless tobacco: Never Used  Substance and Sexual Activity  . Alcohol use: No    Alcohol/week: 0.0 standard drinks  . Drug use: No  . Sexual activity: Not on file  Other Topics Concern  . Not on file  Social History Narrative  . Not on file   Social Determinants of Health   Financial Resource Strain:   . Difficulty of Paying Living Expenses: Not on file  Food Insecurity:   . Worried About Charity fundraiser in the Last Year: Not on file  . Ran Out of Food in the Last Year: Not on file  Transportation Needs:   . Lack of Transportation (Medical): Not on file  . Lack of Transportation (Non-Medical): Not on file  Physical  Activity:   . Days of Exercise per Week: Not on file  . Minutes of Exercise per Session: Not on file  Stress:   . Feeling of Stress : Not on file  Social Connections:   . Frequency of Communication with Friends and Family: Not on file  . Frequency of Social Gatherings with Friends and Family: Not on file  . Attends Religious Services: Not on file  . Active Member of Clubs or Organizations: Not on file  . Attends Archivist Meetings: Not on file  . Marital Status: Not on file  Intimate Partner Violence:   . Fear of Current or Ex-Partner: Not on file  . Emotionally Abused: Not on file  . Physically Abused: Not on file  . Sexually Abused: Not on file     Review of Systems  All other systems reviewed and are negative.      Objective:   Physical Exam  Constitutional: She appears well-developed and well-nourished. No distress.  Eyes: Conjunctivae are normal.  Neck: No JVD present.  Cardiovascular: Normal rate. An irregularly irregular rhythm present.  Murmur heard. Pulmonary/Chest: Effort normal. No respiratory distress. She has no wheezes. She has no rhonchi.  Abdominal: Soft. Bowel sounds are normal. She exhibits no distension. There is no abdominal tenderness. There is no rebound and no guarding.  Musculoskeletal:     Right lower leg:  Edema present.     Left lower leg: Edema present.  Lymphadenopathy:    She has no cervical adenopathy.  Skin: She is not diaphoretic. No erythema.  Vitals reviewed.         Assessment & Plan:  Vascular dementia with behavior disturbance ([place]) - Plan: CBC with Differential/Platelet, COMPLETE METABOLIC PANEL WITH GFR  Persistent atrial fibrillation ([place]) - Plan: CBC with Differential/Platelet, COMPLETE METABOLIC PANEL WITH GFR  S/P TAVR (transcatheter aortic valve replacement)  AKI (acute kidney injury) ([place]) - Plan: CBC with Differential/Platelet, COMPLETE METABOLIC PANEL WITH GFR  Acute upper GI bleed - Plan: CBC with Differential/Platelet, COMPLETE METABOLIC PANEL WITH GFR  Patient has several issues.  First is her dementia with sundowning.  This seems to be managed adequately with Risperdal 0.5 mg twice daily.  Only issues daughter has is that the patient has a difficult time walking up her stairs in front of the home.  She has 5-6 steps that she has to walk a.  She is unable to do this.  Physical therapy is starting to work with the patient.  For the time being and they are having to walk around to the back of the house through the mud in the water where she is able to walk into the home.  Second issue is her recent upper GI bleed.  She is currently on Protonix under GIs recommendation.  However she has warfarin.  Therefore I will check her hemoglobin again today.  If her hemoglobin is stable, we will continue warfarin but I would recommend monitoring her hemoglobin every month.  If her hemoglobin drops again I would recommend discontinuing warfarin.  As I discussed with the patient's daughter, there is no good option.  If we stop warfarin she would be at high risk for stroke.  However if there is any evidence of ongoing GI bleed I believe the risk outweighs the benefit and she will need to stop the warfarin.  Therefore, I would recheck her hemoglobin today.  If her hemoglobin is lower  than 7.9 I will stop the warfarin and start her on iron  supplement.  If her hemoglobin is 7.9 or higher I would recheck her hemoglobin in 1 month.  Third issue is leg swelling.  Patient does not appear to be fluid overloaded today on exam and she has a propensity to become dehydrated.  I have recommended knee-high compression hose to help manage the swelling and prevent venous stasis ulcers.  However if the swelling worsens, we will need to start the patient on a low-dose diuretic to be very cautious and monitor for any evidence of dehydration.

## 2020-02-15 DIAGNOSIS — N3000 Acute cystitis without hematuria: Secondary | ICD-10-CM | POA: Diagnosis not present

## 2020-02-15 DIAGNOSIS — Z7901 Long term (current) use of anticoagulants: Secondary | ICD-10-CM | POA: Diagnosis not present

## 2020-02-15 DIAGNOSIS — E785 Hyperlipidemia, unspecified: Secondary | ICD-10-CM | POA: Diagnosis not present

## 2020-02-15 DIAGNOSIS — E559 Vitamin D deficiency, unspecified: Secondary | ICD-10-CM | POA: Diagnosis not present

## 2020-02-15 DIAGNOSIS — I4821 Permanent atrial fibrillation: Secondary | ICD-10-CM | POA: Diagnosis not present

## 2020-02-15 DIAGNOSIS — E039 Hypothyroidism, unspecified: Secondary | ICD-10-CM | POA: Diagnosis not present

## 2020-02-15 DIAGNOSIS — D649 Anemia, unspecified: Secondary | ICD-10-CM | POA: Diagnosis not present

## 2020-02-15 DIAGNOSIS — I10 Essential (primary) hypertension: Secondary | ICD-10-CM | POA: Diagnosis not present

## 2020-02-15 DIAGNOSIS — E46 Unspecified protein-calorie malnutrition: Secondary | ICD-10-CM | POA: Diagnosis not present

## 2020-02-15 DIAGNOSIS — G9341 Metabolic encephalopathy: Secondary | ICD-10-CM | POA: Diagnosis not present

## 2020-02-15 DIAGNOSIS — Z9181 History of falling: Secondary | ICD-10-CM | POA: Diagnosis not present

## 2020-02-15 DIAGNOSIS — G309 Alzheimer's disease, unspecified: Secondary | ICD-10-CM | POA: Diagnosis not present

## 2020-02-15 DIAGNOSIS — Z952 Presence of prosthetic heart valve: Secondary | ICD-10-CM | POA: Diagnosis not present

## 2020-02-15 DIAGNOSIS — Z96653 Presence of artificial knee joint, bilateral: Secondary | ICD-10-CM | POA: Diagnosis not present

## 2020-02-15 DIAGNOSIS — A419 Sepsis, unspecified organism: Secondary | ICD-10-CM | POA: Diagnosis not present

## 2020-02-15 DIAGNOSIS — I2510 Atherosclerotic heart disease of native coronary artery without angina pectoris: Secondary | ICD-10-CM | POA: Diagnosis not present

## 2020-02-15 DIAGNOSIS — E538 Deficiency of other specified B group vitamins: Secondary | ICD-10-CM | POA: Diagnosis not present

## 2020-02-15 DIAGNOSIS — Z8673 Personal history of transient ischemic attack (TIA), and cerebral infarction without residual deficits: Secondary | ICD-10-CM | POA: Diagnosis not present

## 2020-02-15 LAB — COMPLETE METABOLIC PANEL WITH GFR
AG Ratio: 1.2 (calc) (ref 1.0–2.5)
ALT: 11 U/L (ref 6–29)
AST: 20 U/L (ref 10–35)
Albumin: 3.1 g/dL — ABNORMAL LOW (ref 3.6–5.1)
Alkaline phosphatase (APISO): 55 U/L (ref 37–153)
BUN/Creatinine Ratio: 14 (calc) (ref 6–22)
BUN: 13 mg/dL (ref 7–25)
CO2: 25 mmol/L (ref 20–32)
Calcium: 7.8 mg/dL — ABNORMAL LOW (ref 8.6–10.4)
Chloride: 110 mmol/L (ref 98–110)
Creat: 0.94 mg/dL — ABNORMAL HIGH (ref 0.60–0.88)
GFR, Est African American: 65 mL/min/{1.73_m2} (ref >=60)
GFR, Est Non African American: 56 mL/min/{1.73_m2} — ABNORMAL LOW (ref >=60)
Globulin: 2.5 g/dL (calc) (ref 1.9–3.7)
Glucose, Bld: 88 mg/dL (ref 65–99)
Potassium: 4.3 mmol/L (ref 3.5–5.3)
Sodium: 142 mmol/L (ref 135–146)
Total Bilirubin: 0.6 mg/dL (ref 0.2–1.2)
Total Protein: 5.6 g/dL — ABNORMAL LOW (ref 6.1–8.1)

## 2020-02-15 LAB — CBC WITH DIFFERENTIAL/PLATELET
Absolute Monocytes: 331 cells/uL (ref 200–950)
Basophils Absolute: 29 cells/uL (ref 0–200)
Basophils Relative: 0.5 %
Eosinophils Absolute: 180 cells/uL (ref 15–500)
Eosinophils Relative: 3.1 %
HCT: 33.2 % — ABNORMAL LOW (ref 35.0–45.0)
Hemoglobin: 10.5 g/dL — ABNORMAL LOW (ref 11.7–15.5)
Lymphs Abs: 911 cells/uL (ref 850–3900)
MCH: 29.5 pg (ref 27.0–33.0)
MCHC: 31.6 g/dL — ABNORMAL LOW (ref 32.0–36.0)
MCV: 93.3 fL (ref 80.0–100.0)
MPV: 9.8 fL (ref 7.5–12.5)
Monocytes Relative: 5.7 %
Neutro Abs: 4350 cells/uL (ref 1500–7800)
Neutrophils Relative %: 75 %
Platelets: 195 10*3/uL (ref 140–400)
RBC: 3.56 10*6/uL — ABNORMAL LOW (ref 3.80–5.10)
RDW: 14.5 % (ref 11.0–15.0)
Total Lymphocyte: 15.7 %
WBC: 5.8 10*3/uL (ref 3.8–10.8)

## 2020-02-17 DIAGNOSIS — G309 Alzheimer's disease, unspecified: Secondary | ICD-10-CM | POA: Diagnosis not present

## 2020-02-17 DIAGNOSIS — Z96653 Presence of artificial knee joint, bilateral: Secondary | ICD-10-CM | POA: Diagnosis not present

## 2020-02-17 DIAGNOSIS — E559 Vitamin D deficiency, unspecified: Secondary | ICD-10-CM | POA: Diagnosis not present

## 2020-02-17 DIAGNOSIS — E46 Unspecified protein-calorie malnutrition: Secondary | ICD-10-CM | POA: Diagnosis not present

## 2020-02-17 DIAGNOSIS — E039 Hypothyroidism, unspecified: Secondary | ICD-10-CM | POA: Diagnosis not present

## 2020-02-17 DIAGNOSIS — D649 Anemia, unspecified: Secondary | ICD-10-CM | POA: Diagnosis not present

## 2020-02-17 DIAGNOSIS — E785 Hyperlipidemia, unspecified: Secondary | ICD-10-CM | POA: Diagnosis not present

## 2020-02-17 DIAGNOSIS — E538 Deficiency of other specified B group vitamins: Secondary | ICD-10-CM | POA: Diagnosis not present

## 2020-02-17 DIAGNOSIS — Z8673 Personal history of transient ischemic attack (TIA), and cerebral infarction without residual deficits: Secondary | ICD-10-CM | POA: Diagnosis not present

## 2020-02-17 DIAGNOSIS — I4821 Permanent atrial fibrillation: Secondary | ICD-10-CM | POA: Diagnosis not present

## 2020-02-17 DIAGNOSIS — I2510 Atherosclerotic heart disease of native coronary artery without angina pectoris: Secondary | ICD-10-CM | POA: Diagnosis not present

## 2020-02-17 DIAGNOSIS — N3000 Acute cystitis without hematuria: Secondary | ICD-10-CM | POA: Diagnosis not present

## 2020-02-17 DIAGNOSIS — G9341 Metabolic encephalopathy: Secondary | ICD-10-CM | POA: Diagnosis not present

## 2020-02-17 DIAGNOSIS — Z9181 History of falling: Secondary | ICD-10-CM | POA: Diagnosis not present

## 2020-02-17 DIAGNOSIS — Z952 Presence of prosthetic heart valve: Secondary | ICD-10-CM | POA: Diagnosis not present

## 2020-02-17 DIAGNOSIS — I10 Essential (primary) hypertension: Secondary | ICD-10-CM | POA: Diagnosis not present

## 2020-02-17 DIAGNOSIS — Z7901 Long term (current) use of anticoagulants: Secondary | ICD-10-CM | POA: Diagnosis not present

## 2020-02-17 DIAGNOSIS — A419 Sepsis, unspecified organism: Secondary | ICD-10-CM | POA: Diagnosis not present

## 2020-02-18 DIAGNOSIS — E538 Deficiency of other specified B group vitamins: Secondary | ICD-10-CM | POA: Diagnosis not present

## 2020-02-18 DIAGNOSIS — Z8673 Personal history of transient ischemic attack (TIA), and cerebral infarction without residual deficits: Secondary | ICD-10-CM | POA: Diagnosis not present

## 2020-02-18 DIAGNOSIS — Z9181 History of falling: Secondary | ICD-10-CM | POA: Diagnosis not present

## 2020-02-18 DIAGNOSIS — E039 Hypothyroidism, unspecified: Secondary | ICD-10-CM | POA: Diagnosis not present

## 2020-02-18 DIAGNOSIS — Z96653 Presence of artificial knee joint, bilateral: Secondary | ICD-10-CM | POA: Diagnosis not present

## 2020-02-18 DIAGNOSIS — E559 Vitamin D deficiency, unspecified: Secondary | ICD-10-CM | POA: Diagnosis not present

## 2020-02-18 DIAGNOSIS — G9341 Metabolic encephalopathy: Secondary | ICD-10-CM | POA: Diagnosis not present

## 2020-02-18 DIAGNOSIS — G309 Alzheimer's disease, unspecified: Secondary | ICD-10-CM | POA: Diagnosis not present

## 2020-02-18 DIAGNOSIS — N3000 Acute cystitis without hematuria: Secondary | ICD-10-CM | POA: Diagnosis not present

## 2020-02-18 DIAGNOSIS — E785 Hyperlipidemia, unspecified: Secondary | ICD-10-CM | POA: Diagnosis not present

## 2020-02-18 DIAGNOSIS — Z7901 Long term (current) use of anticoagulants: Secondary | ICD-10-CM | POA: Diagnosis not present

## 2020-02-18 DIAGNOSIS — I4821 Permanent atrial fibrillation: Secondary | ICD-10-CM | POA: Diagnosis not present

## 2020-02-18 DIAGNOSIS — A419 Sepsis, unspecified organism: Secondary | ICD-10-CM | POA: Diagnosis not present

## 2020-02-18 DIAGNOSIS — I10 Essential (primary) hypertension: Secondary | ICD-10-CM | POA: Diagnosis not present

## 2020-02-18 DIAGNOSIS — D649 Anemia, unspecified: Secondary | ICD-10-CM | POA: Diagnosis not present

## 2020-02-18 DIAGNOSIS — Z952 Presence of prosthetic heart valve: Secondary | ICD-10-CM | POA: Diagnosis not present

## 2020-02-18 DIAGNOSIS — E46 Unspecified protein-calorie malnutrition: Secondary | ICD-10-CM | POA: Diagnosis not present

## 2020-02-18 DIAGNOSIS — I2510 Atherosclerotic heart disease of native coronary artery without angina pectoris: Secondary | ICD-10-CM | POA: Diagnosis not present

## 2020-02-21 DIAGNOSIS — D649 Anemia, unspecified: Secondary | ICD-10-CM | POA: Diagnosis not present

## 2020-02-21 DIAGNOSIS — E559 Vitamin D deficiency, unspecified: Secondary | ICD-10-CM | POA: Diagnosis not present

## 2020-02-21 DIAGNOSIS — E538 Deficiency of other specified B group vitamins: Secondary | ICD-10-CM | POA: Diagnosis not present

## 2020-02-21 DIAGNOSIS — E46 Unspecified protein-calorie malnutrition: Secondary | ICD-10-CM | POA: Diagnosis not present

## 2020-02-21 DIAGNOSIS — I4821 Permanent atrial fibrillation: Secondary | ICD-10-CM | POA: Diagnosis not present

## 2020-02-21 DIAGNOSIS — Z9181 History of falling: Secondary | ICD-10-CM | POA: Diagnosis not present

## 2020-02-21 DIAGNOSIS — G309 Alzheimer's disease, unspecified: Secondary | ICD-10-CM | POA: Diagnosis not present

## 2020-02-21 DIAGNOSIS — Z8673 Personal history of transient ischemic attack (TIA), and cerebral infarction without residual deficits: Secondary | ICD-10-CM | POA: Diagnosis not present

## 2020-02-21 DIAGNOSIS — I10 Essential (primary) hypertension: Secondary | ICD-10-CM | POA: Diagnosis not present

## 2020-02-21 DIAGNOSIS — Z952 Presence of prosthetic heart valve: Secondary | ICD-10-CM | POA: Diagnosis not present

## 2020-02-21 DIAGNOSIS — E785 Hyperlipidemia, unspecified: Secondary | ICD-10-CM | POA: Diagnosis not present

## 2020-02-21 DIAGNOSIS — Z7901 Long term (current) use of anticoagulants: Secondary | ICD-10-CM | POA: Diagnosis not present

## 2020-02-21 DIAGNOSIS — Z96653 Presence of artificial knee joint, bilateral: Secondary | ICD-10-CM | POA: Diagnosis not present

## 2020-02-21 DIAGNOSIS — E039 Hypothyroidism, unspecified: Secondary | ICD-10-CM | POA: Diagnosis not present

## 2020-02-21 DIAGNOSIS — N3000 Acute cystitis without hematuria: Secondary | ICD-10-CM | POA: Diagnosis not present

## 2020-02-21 DIAGNOSIS — G9341 Metabolic encephalopathy: Secondary | ICD-10-CM | POA: Diagnosis not present

## 2020-02-21 DIAGNOSIS — I2510 Atherosclerotic heart disease of native coronary artery without angina pectoris: Secondary | ICD-10-CM | POA: Diagnosis not present

## 2020-02-21 DIAGNOSIS — A419 Sepsis, unspecified organism: Secondary | ICD-10-CM | POA: Diagnosis not present

## 2020-02-22 IMAGING — CR DG LUMBAR SPINE COMPLETE 4+V
5 series · 5 of 5 positions shown · non-contrast
Comparison: Radiographs September 05, 2009.

CLINICAL DATA: Chronic lower back pain.

EXAM:
LUMBAR SPINE - COMPLETE 4+ VIEW

[w lumbar spine ap]
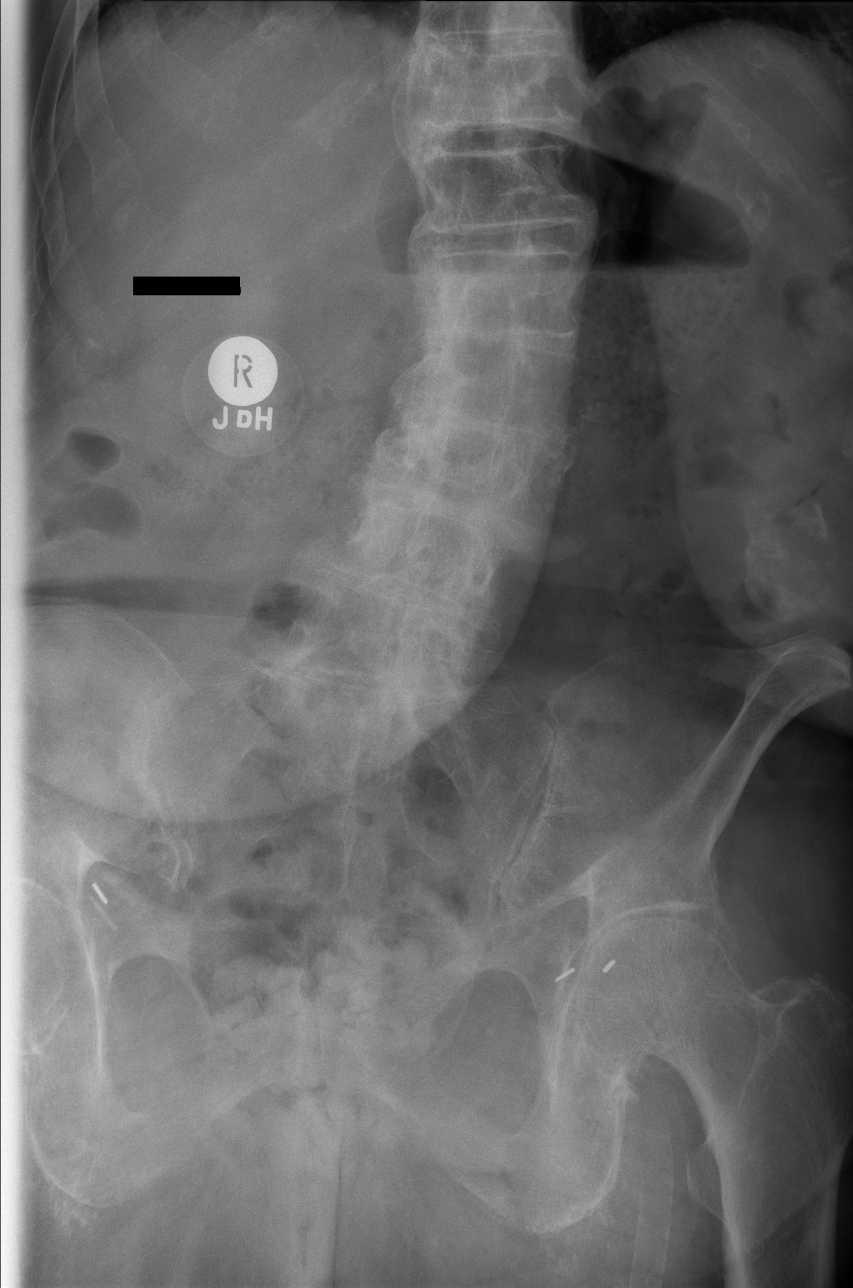

[w lumbar spine obl (1 of 2)]
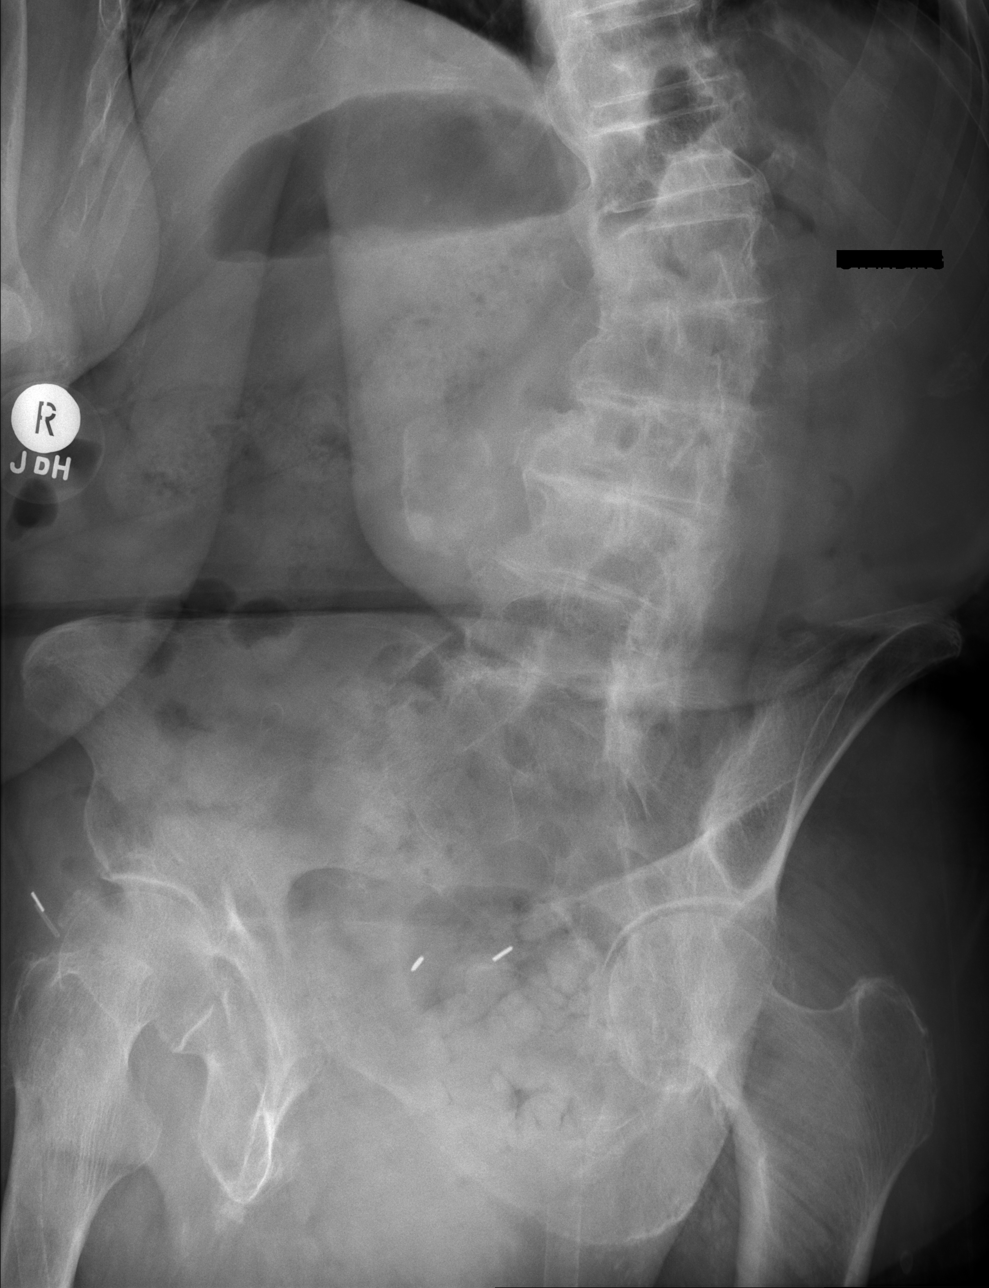

[w lumbar spine obl (2 of 2)]
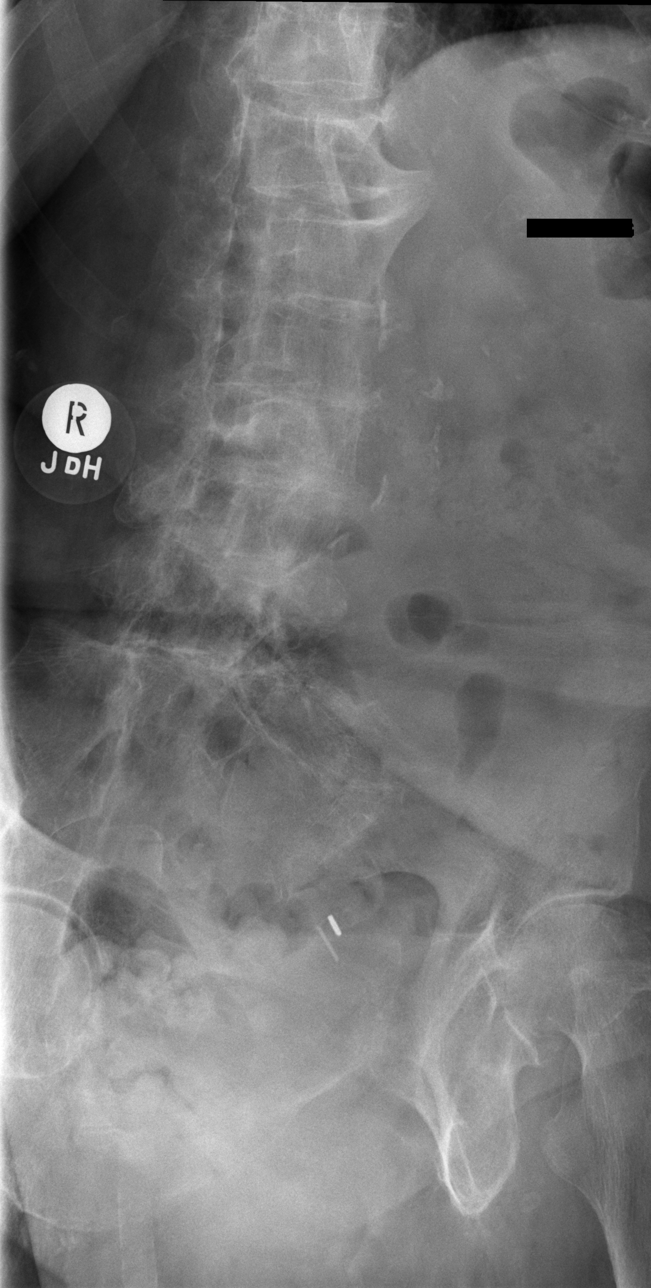

[w lumbar spine lat]
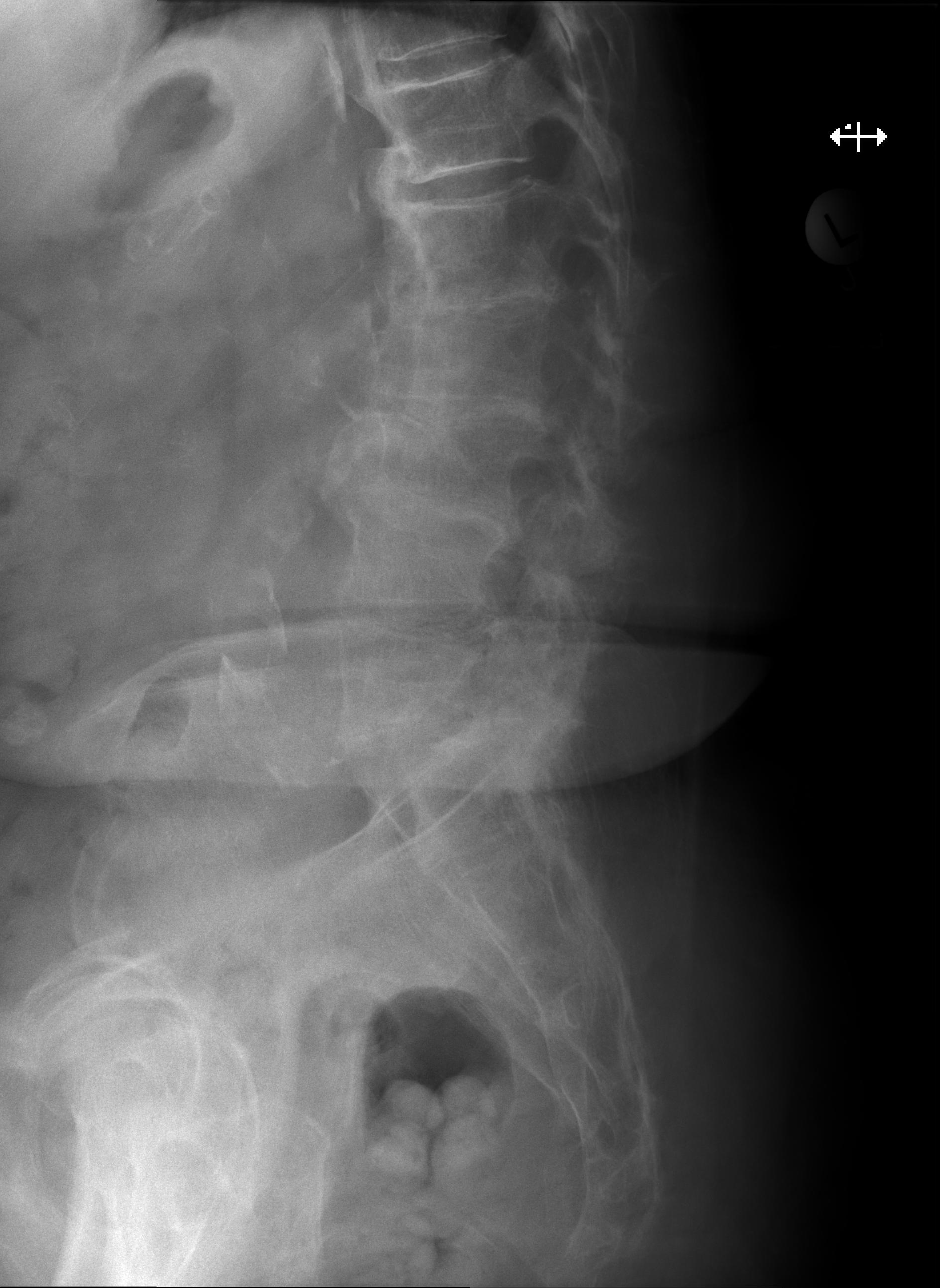

[w lumbar l-5 s-1 spot]
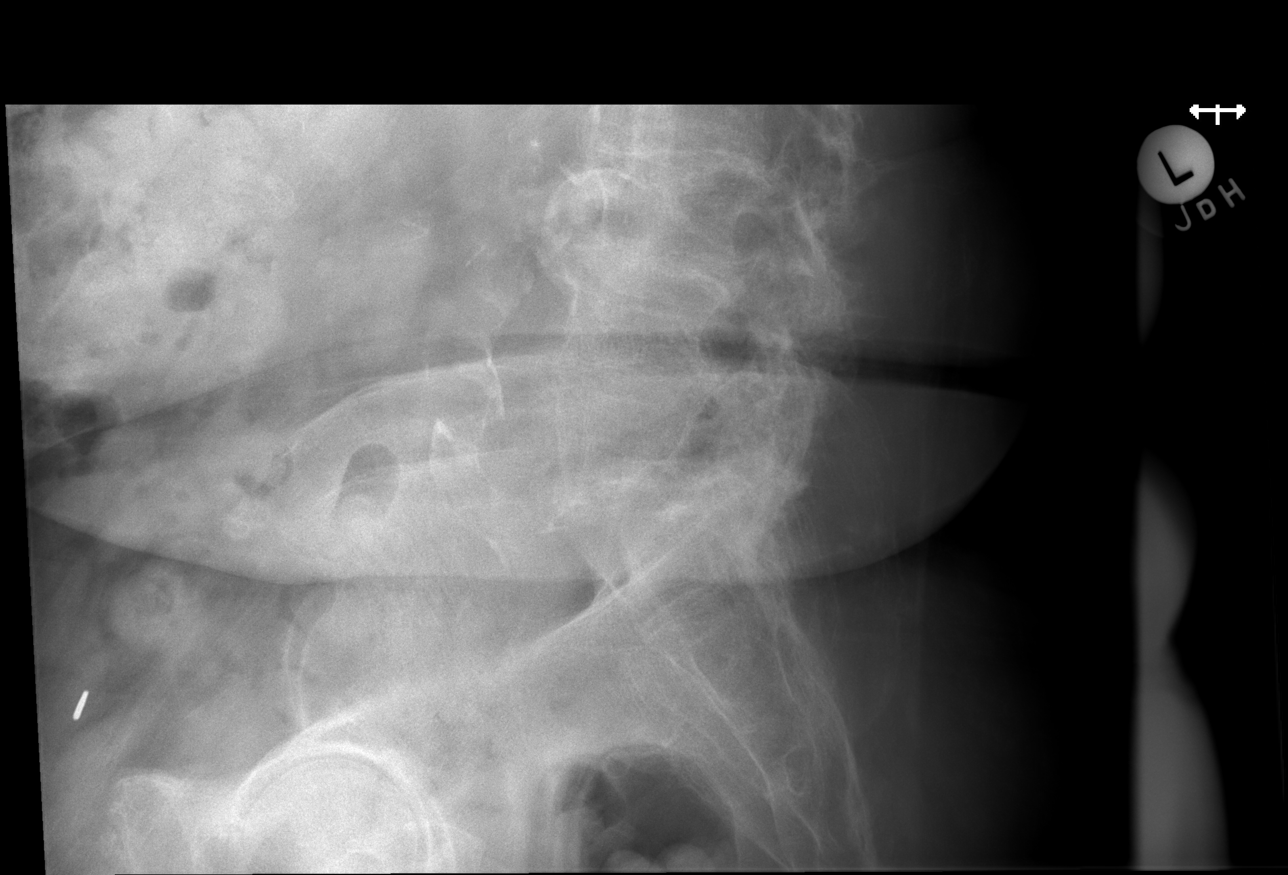

[5 of 5 positions shown; findings below may reference images not displayed]

FINDINGS: No fracture or spondylolisthesis is noted. Severe degenerative disc
disease is noted at L1-2, L2-3, L3-4 and L5-S1. Mild degenerative
disc disease is noted at L4-5. Atherosclerosis of abdominal aorta is
noted.
IMPRESSION: Severe multilevel degenerative disc disease. No acute abnormality
seen in the lumbar spine.

## 2020-02-22 IMAGING — CR DG HIP (WITH OR WITHOUT PELVIS) 2-3V*L*
3 series · 3 of 3 positions shown · non-contrast
Comparison: None.

CLINICAL DATA: Chronic left hip pain without known injury.

EXAM:
DG HIP (WITH OR WITHOUT PELVIS) 2-3V LEFT

[w pelvis upright]
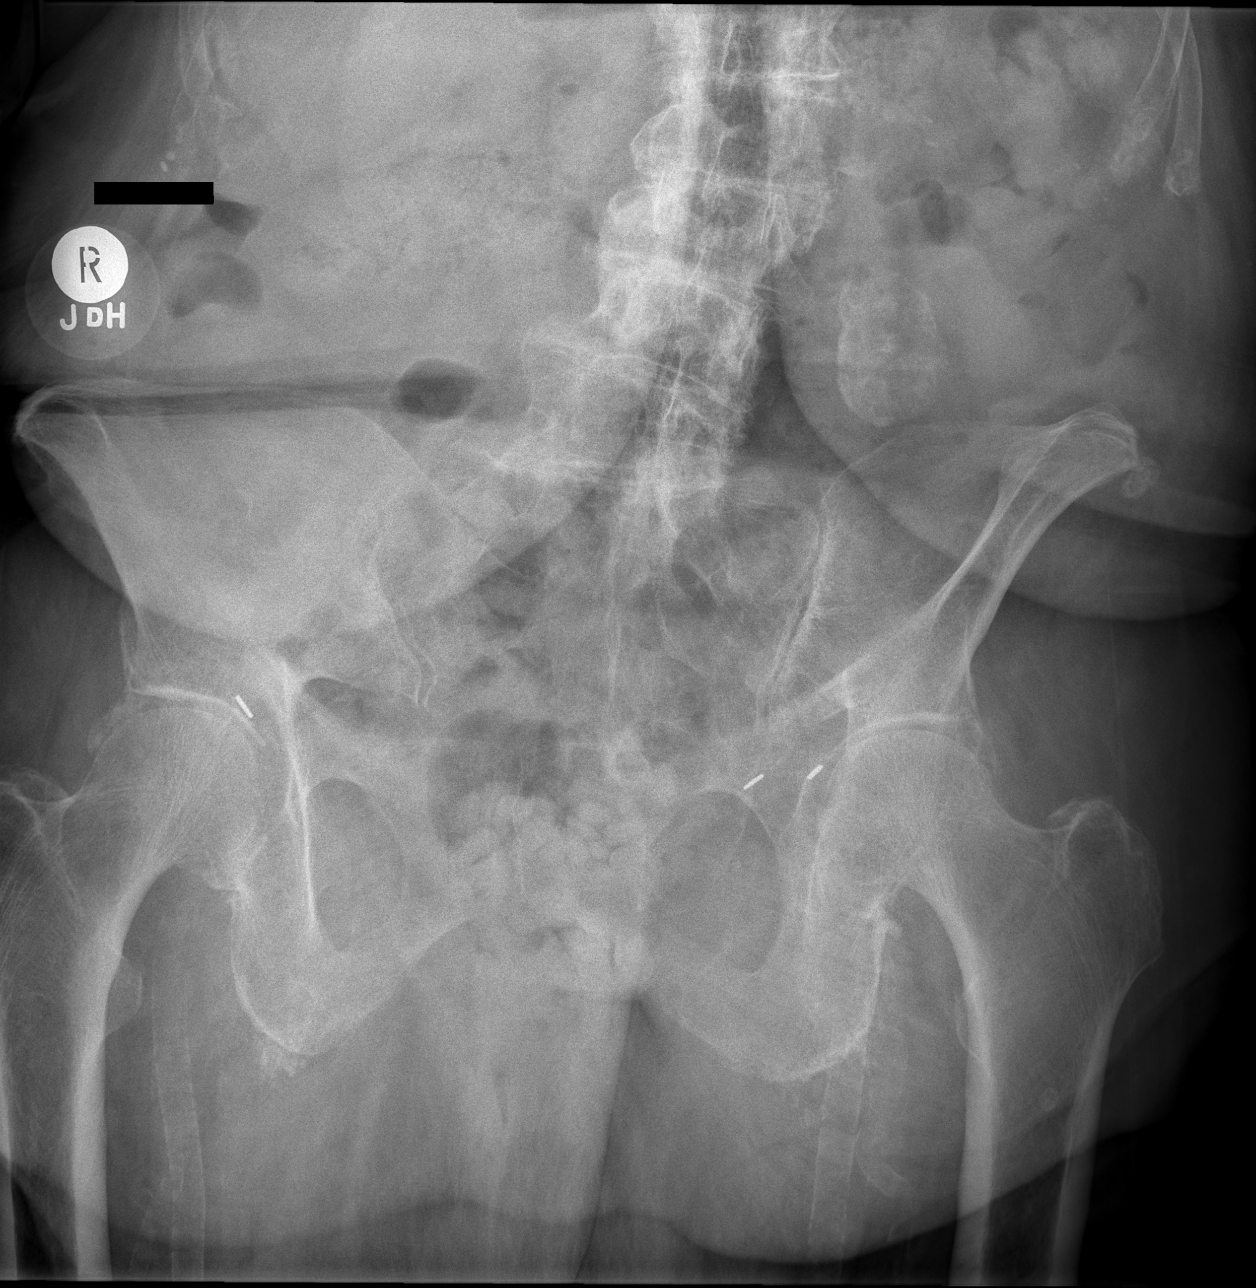

[w hip ap left]
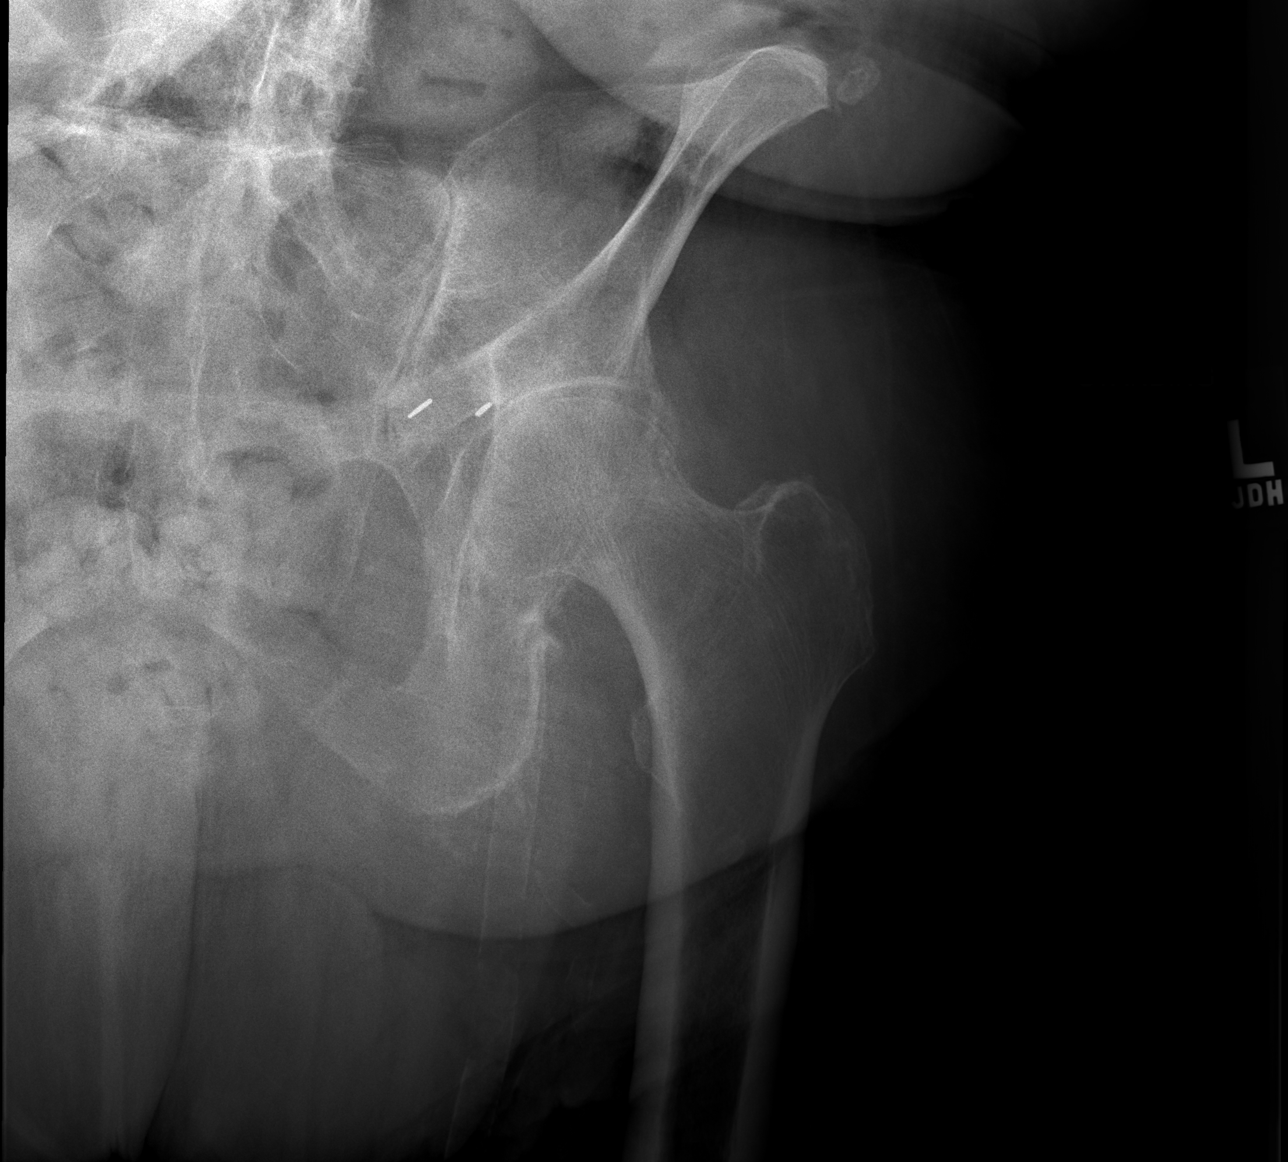

[w hip lat left]
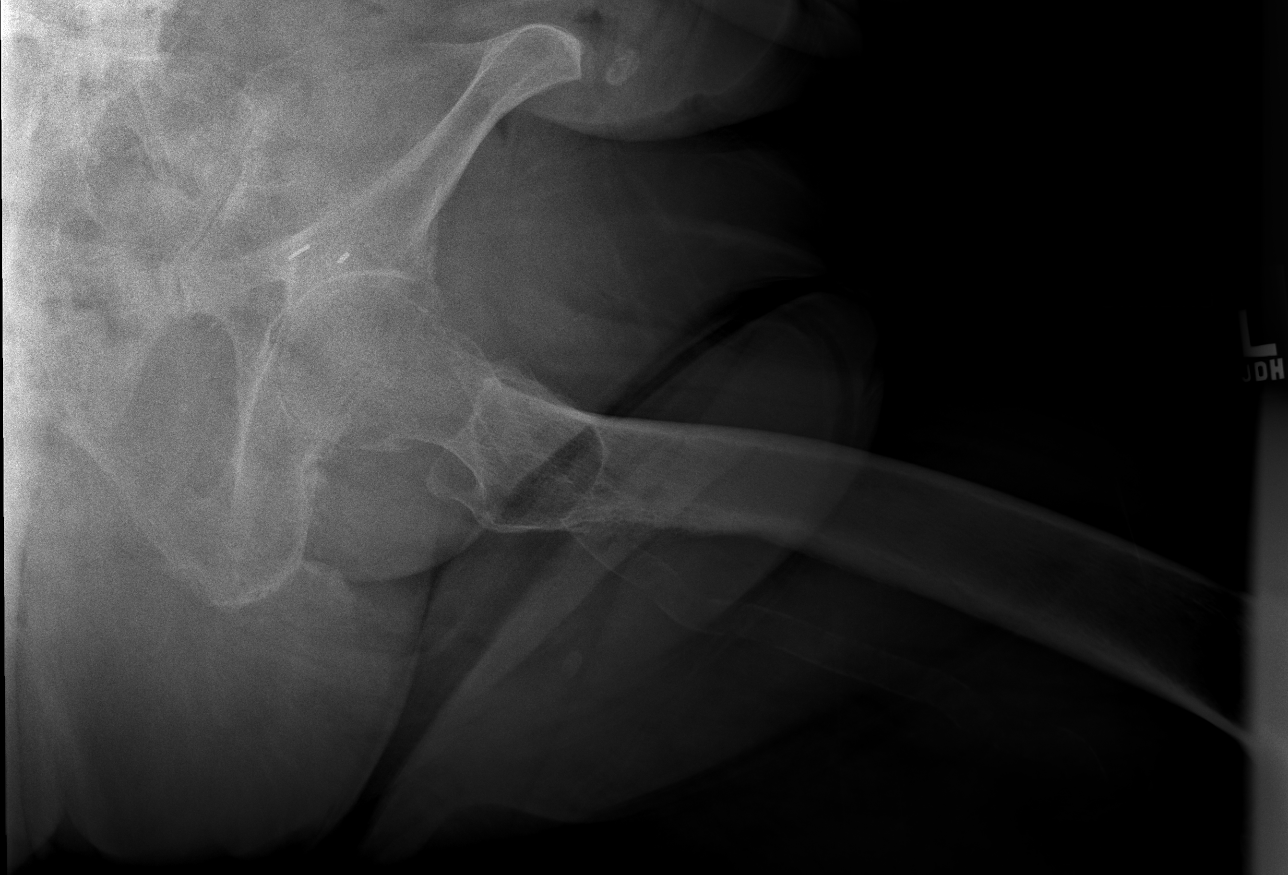

[3 of 3 positions shown; findings below may reference images not displayed]

FINDINGS: There is no evidence of hip fracture or dislocation. Mild narrowing
of the left hip joint is noted.
IMPRESSION: Mild degenerative joint disease of the left hip. No acute
abnormality is seen.

## 2020-02-23 DIAGNOSIS — N3000 Acute cystitis without hematuria: Secondary | ICD-10-CM | POA: Diagnosis not present

## 2020-02-23 DIAGNOSIS — Z96653 Presence of artificial knee joint, bilateral: Secondary | ICD-10-CM | POA: Diagnosis not present

## 2020-02-23 DIAGNOSIS — Z9181 History of falling: Secondary | ICD-10-CM | POA: Diagnosis not present

## 2020-02-23 DIAGNOSIS — I10 Essential (primary) hypertension: Secondary | ICD-10-CM | POA: Diagnosis not present

## 2020-02-23 DIAGNOSIS — E785 Hyperlipidemia, unspecified: Secondary | ICD-10-CM | POA: Diagnosis not present

## 2020-02-23 DIAGNOSIS — E559 Vitamin D deficiency, unspecified: Secondary | ICD-10-CM | POA: Diagnosis not present

## 2020-02-23 DIAGNOSIS — A419 Sepsis, unspecified organism: Secondary | ICD-10-CM | POA: Diagnosis not present

## 2020-02-23 DIAGNOSIS — I4821 Permanent atrial fibrillation: Secondary | ICD-10-CM | POA: Diagnosis not present

## 2020-02-23 DIAGNOSIS — G9341 Metabolic encephalopathy: Secondary | ICD-10-CM | POA: Diagnosis not present

## 2020-02-23 DIAGNOSIS — Z952 Presence of prosthetic heart valve: Secondary | ICD-10-CM | POA: Diagnosis not present

## 2020-02-23 DIAGNOSIS — Z7901 Long term (current) use of anticoagulants: Secondary | ICD-10-CM | POA: Diagnosis not present

## 2020-02-23 DIAGNOSIS — I2510 Atherosclerotic heart disease of native coronary artery without angina pectoris: Secondary | ICD-10-CM | POA: Diagnosis not present

## 2020-02-23 DIAGNOSIS — D649 Anemia, unspecified: Secondary | ICD-10-CM | POA: Diagnosis not present

## 2020-02-23 DIAGNOSIS — E46 Unspecified protein-calorie malnutrition: Secondary | ICD-10-CM | POA: Diagnosis not present

## 2020-02-23 DIAGNOSIS — E039 Hypothyroidism, unspecified: Secondary | ICD-10-CM | POA: Diagnosis not present

## 2020-02-23 DIAGNOSIS — Z8673 Personal history of transient ischemic attack (TIA), and cerebral infarction without residual deficits: Secondary | ICD-10-CM | POA: Diagnosis not present

## 2020-02-23 DIAGNOSIS — E538 Deficiency of other specified B group vitamins: Secondary | ICD-10-CM | POA: Diagnosis not present

## 2020-02-23 DIAGNOSIS — G309 Alzheimer's disease, unspecified: Secondary | ICD-10-CM | POA: Diagnosis not present

## 2020-02-24 ENCOUNTER — Other Ambulatory Visit: Payer: Self-pay | Admitting: Family Medicine

## 2020-02-25 ENCOUNTER — Other Ambulatory Visit: Payer: Self-pay

## 2020-02-25 ENCOUNTER — Ambulatory Visit: Payer: Medicare Other

## 2020-02-25 DIAGNOSIS — E46 Unspecified protein-calorie malnutrition: Secondary | ICD-10-CM | POA: Diagnosis not present

## 2020-02-25 DIAGNOSIS — Z952 Presence of prosthetic heart valve: Secondary | ICD-10-CM | POA: Diagnosis not present

## 2020-02-25 DIAGNOSIS — Z5181 Encounter for therapeutic drug level monitoring: Secondary | ICD-10-CM | POA: Diagnosis not present

## 2020-02-25 DIAGNOSIS — Z96653 Presence of artificial knee joint, bilateral: Secondary | ICD-10-CM | POA: Diagnosis not present

## 2020-02-25 DIAGNOSIS — I2510 Atherosclerotic heart disease of native coronary artery without angina pectoris: Secondary | ICD-10-CM | POA: Diagnosis not present

## 2020-02-25 DIAGNOSIS — D649 Anemia, unspecified: Secondary | ICD-10-CM | POA: Diagnosis not present

## 2020-02-25 DIAGNOSIS — G309 Alzheimer's disease, unspecified: Secondary | ICD-10-CM | POA: Diagnosis not present

## 2020-02-25 DIAGNOSIS — Z9181 History of falling: Secondary | ICD-10-CM | POA: Diagnosis not present

## 2020-02-25 DIAGNOSIS — E559 Vitamin D deficiency, unspecified: Secondary | ICD-10-CM | POA: Diagnosis not present

## 2020-02-25 DIAGNOSIS — E039 Hypothyroidism, unspecified: Secondary | ICD-10-CM | POA: Diagnosis not present

## 2020-02-25 DIAGNOSIS — I4821 Permanent atrial fibrillation: Secondary | ICD-10-CM | POA: Diagnosis not present

## 2020-02-25 DIAGNOSIS — Z7901 Long term (current) use of anticoagulants: Secondary | ICD-10-CM | POA: Diagnosis not present

## 2020-02-25 DIAGNOSIS — N3000 Acute cystitis without hematuria: Secondary | ICD-10-CM | POA: Diagnosis not present

## 2020-02-25 DIAGNOSIS — G9341 Metabolic encephalopathy: Secondary | ICD-10-CM | POA: Diagnosis not present

## 2020-02-25 DIAGNOSIS — E538 Deficiency of other specified B group vitamins: Secondary | ICD-10-CM | POA: Diagnosis not present

## 2020-02-25 DIAGNOSIS — I4819 Other persistent atrial fibrillation: Secondary | ICD-10-CM | POA: Diagnosis not present

## 2020-02-25 DIAGNOSIS — A419 Sepsis, unspecified organism: Secondary | ICD-10-CM | POA: Diagnosis not present

## 2020-02-25 DIAGNOSIS — I10 Essential (primary) hypertension: Secondary | ICD-10-CM | POA: Diagnosis not present

## 2020-02-25 DIAGNOSIS — E785 Hyperlipidemia, unspecified: Secondary | ICD-10-CM | POA: Diagnosis not present

## 2020-02-25 DIAGNOSIS — Z8673 Personal history of transient ischemic attack (TIA), and cerebral infarction without residual deficits: Secondary | ICD-10-CM | POA: Diagnosis not present

## 2020-02-25 LAB — POCT INR: INR: 3.4 — AB (ref 2.0–3.0)

## 2020-02-25 NOTE — Patient Instructions (Signed)
Description   Skip today's dosage of Coumadin, then start taking 2mg  daily except 1mg  on Tuesdays. Recheck INR in 2 weeks. Please call [phone], for any changes in medications or up coming procedures.

## 2020-02-28 ENCOUNTER — Telehealth: Payer: Self-pay | Admitting: Family Medicine

## 2020-02-28 DIAGNOSIS — D649 Anemia, unspecified: Secondary | ICD-10-CM | POA: Diagnosis not present

## 2020-02-28 DIAGNOSIS — Z952 Presence of prosthetic heart valve: Secondary | ICD-10-CM | POA: Diagnosis not present

## 2020-02-28 DIAGNOSIS — E46 Unspecified protein-calorie malnutrition: Secondary | ICD-10-CM | POA: Diagnosis not present

## 2020-02-28 DIAGNOSIS — Z8673 Personal history of transient ischemic attack (TIA), and cerebral infarction without residual deficits: Secondary | ICD-10-CM | POA: Diagnosis not present

## 2020-02-28 DIAGNOSIS — N3000 Acute cystitis without hematuria: Secondary | ICD-10-CM | POA: Diagnosis not present

## 2020-02-28 DIAGNOSIS — E039 Hypothyroidism, unspecified: Secondary | ICD-10-CM | POA: Diagnosis not present

## 2020-02-28 DIAGNOSIS — Z7901 Long term (current) use of anticoagulants: Secondary | ICD-10-CM | POA: Diagnosis not present

## 2020-02-28 DIAGNOSIS — E785 Hyperlipidemia, unspecified: Secondary | ICD-10-CM | POA: Diagnosis not present

## 2020-02-28 DIAGNOSIS — G9341 Metabolic encephalopathy: Secondary | ICD-10-CM | POA: Diagnosis not present

## 2020-02-28 DIAGNOSIS — E538 Deficiency of other specified B group vitamins: Secondary | ICD-10-CM | POA: Diagnosis not present

## 2020-02-28 DIAGNOSIS — E559 Vitamin D deficiency, unspecified: Secondary | ICD-10-CM | POA: Diagnosis not present

## 2020-02-28 DIAGNOSIS — I2510 Atherosclerotic heart disease of native coronary artery without angina pectoris: Secondary | ICD-10-CM | POA: Diagnosis not present

## 2020-02-28 DIAGNOSIS — G309 Alzheimer's disease, unspecified: Secondary | ICD-10-CM | POA: Diagnosis not present

## 2020-02-28 DIAGNOSIS — A419 Sepsis, unspecified organism: Secondary | ICD-10-CM | POA: Diagnosis not present

## 2020-02-28 DIAGNOSIS — Z9181 History of falling: Secondary | ICD-10-CM | POA: Diagnosis not present

## 2020-02-28 DIAGNOSIS — I4821 Permanent atrial fibrillation: Secondary | ICD-10-CM | POA: Diagnosis not present

## 2020-02-28 DIAGNOSIS — Z96653 Presence of artificial knee joint, bilateral: Secondary | ICD-10-CM | POA: Diagnosis not present

## 2020-02-28 DIAGNOSIS — I10 Essential (primary) hypertension: Secondary | ICD-10-CM | POA: Diagnosis not present

## 2020-02-28 NOTE — Telephone Encounter (Signed)
[name] called and states that the pt will not wear the compression hose as she states they hurt her legs. [name] states that her legs are swelling, not leaking at this point and wanted to know if you would call her in that fluid pill you discussed?

## 2020-02-29 MED ORDER — POTASSIUM CHLORIDE ER 10 MEQ PO TBCR: 10.0000 meq | EXTENDED_RELEASE_TABLET | Freq: Every day | ORAL | 3 refills | Status: DC

## 2020-02-29 MED ORDER — FUROSEMIDE 20 MG PO TABS: 20.0000 mg | ORAL_TABLET | Freq: Every day | ORAL | 3 refills | Status: DC

## 2020-02-29 NOTE — Telephone Encounter (Signed)
Try lasix 20 mg poqday in the am.  Take kdur 10 meq poqday as well.

## 2020-02-29 NOTE — Telephone Encounter (Signed)
Pt's daughter aware and med sent to [facility]

## 2020-03-01 DIAGNOSIS — I10 Essential (primary) hypertension: Secondary | ICD-10-CM | POA: Diagnosis not present

## 2020-03-01 DIAGNOSIS — E46 Unspecified protein-calorie malnutrition: Secondary | ICD-10-CM | POA: Diagnosis not present

## 2020-03-01 DIAGNOSIS — Z8673 Personal history of transient ischemic attack (TIA), and cerebral infarction without residual deficits: Secondary | ICD-10-CM | POA: Diagnosis not present

## 2020-03-01 DIAGNOSIS — E785 Hyperlipidemia, unspecified: Secondary | ICD-10-CM | POA: Diagnosis not present

## 2020-03-01 DIAGNOSIS — D649 Anemia, unspecified: Secondary | ICD-10-CM | POA: Diagnosis not present

## 2020-03-01 DIAGNOSIS — I2510 Atherosclerotic heart disease of native coronary artery without angina pectoris: Secondary | ICD-10-CM | POA: Diagnosis not present

## 2020-03-01 DIAGNOSIS — Z9181 History of falling: Secondary | ICD-10-CM | POA: Diagnosis not present

## 2020-03-01 DIAGNOSIS — Z7901 Long term (current) use of anticoagulants: Secondary | ICD-10-CM | POA: Diagnosis not present

## 2020-03-01 DIAGNOSIS — I4821 Permanent atrial fibrillation: Secondary | ICD-10-CM | POA: Diagnosis not present

## 2020-03-01 DIAGNOSIS — A419 Sepsis, unspecified organism: Secondary | ICD-10-CM | POA: Diagnosis not present

## 2020-03-01 DIAGNOSIS — G309 Alzheimer's disease, unspecified: Secondary | ICD-10-CM | POA: Diagnosis not present

## 2020-03-01 DIAGNOSIS — G9341 Metabolic encephalopathy: Secondary | ICD-10-CM | POA: Diagnosis not present

## 2020-03-01 DIAGNOSIS — E538 Deficiency of other specified B group vitamins: Secondary | ICD-10-CM | POA: Diagnosis not present

## 2020-03-01 DIAGNOSIS — Z96653 Presence of artificial knee joint, bilateral: Secondary | ICD-10-CM | POA: Diagnosis not present

## 2020-03-01 DIAGNOSIS — N3000 Acute cystitis without hematuria: Secondary | ICD-10-CM | POA: Diagnosis not present

## 2020-03-01 DIAGNOSIS — Z952 Presence of prosthetic heart valve: Secondary | ICD-10-CM | POA: Diagnosis not present

## 2020-03-01 DIAGNOSIS — E559 Vitamin D deficiency, unspecified: Secondary | ICD-10-CM | POA: Diagnosis not present

## 2020-03-01 DIAGNOSIS — E039 Hypothyroidism, unspecified: Secondary | ICD-10-CM | POA: Diagnosis not present

## 2020-03-02 ENCOUNTER — Other Ambulatory Visit: Payer: Self-pay | Admitting: Family Medicine

## 2020-03-02 IMAGING — CR DG HIP (WITH OR WITHOUT PELVIS) 3-4V BILAT
4 series · 4 of 4 positions shown · non-contrast
Comparison: 10/07/2018

CLINICAL DATA: Hip pain bilaterally, recent fall

EXAM:
DG HIP (WITH OR WITHOUT PELVIS) 3-4V BILAT

[w hip ap left]
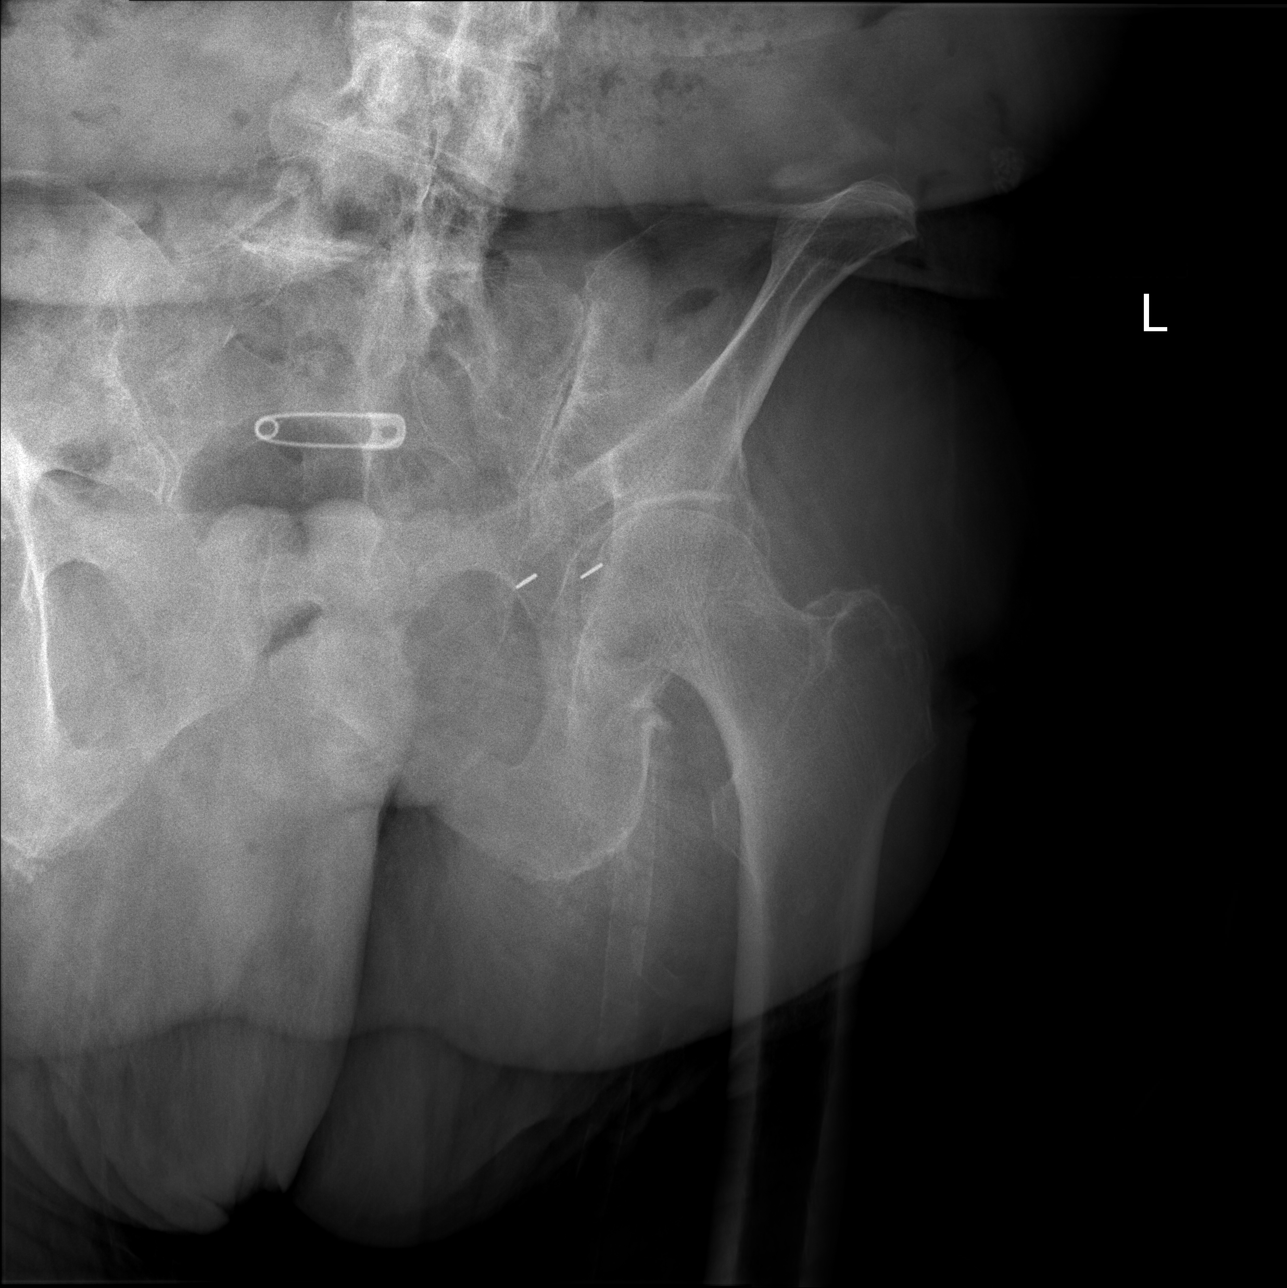

[w hip frog left]
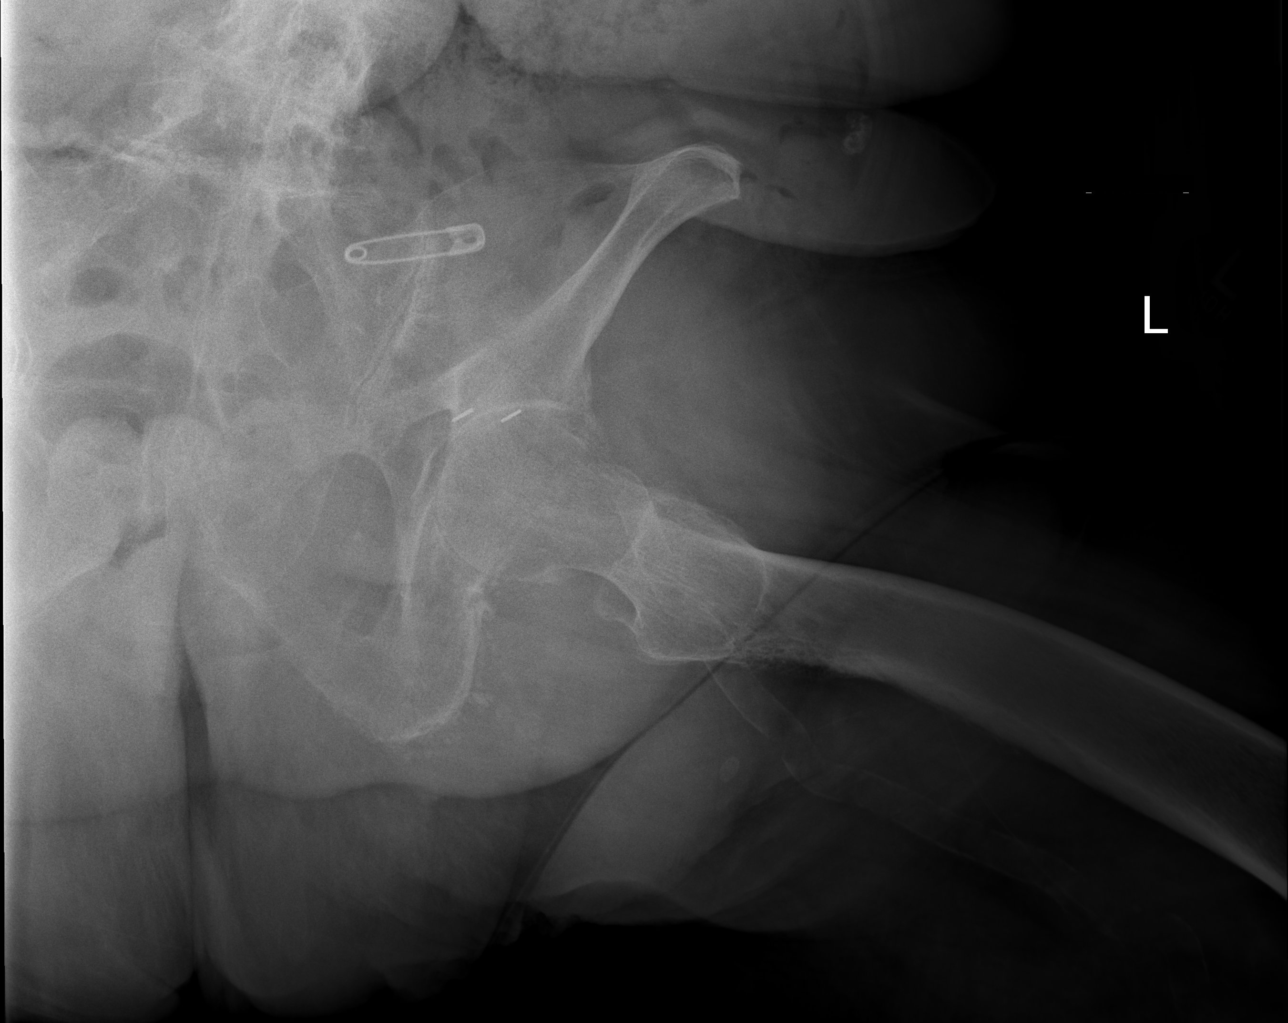

[w hip ap right]
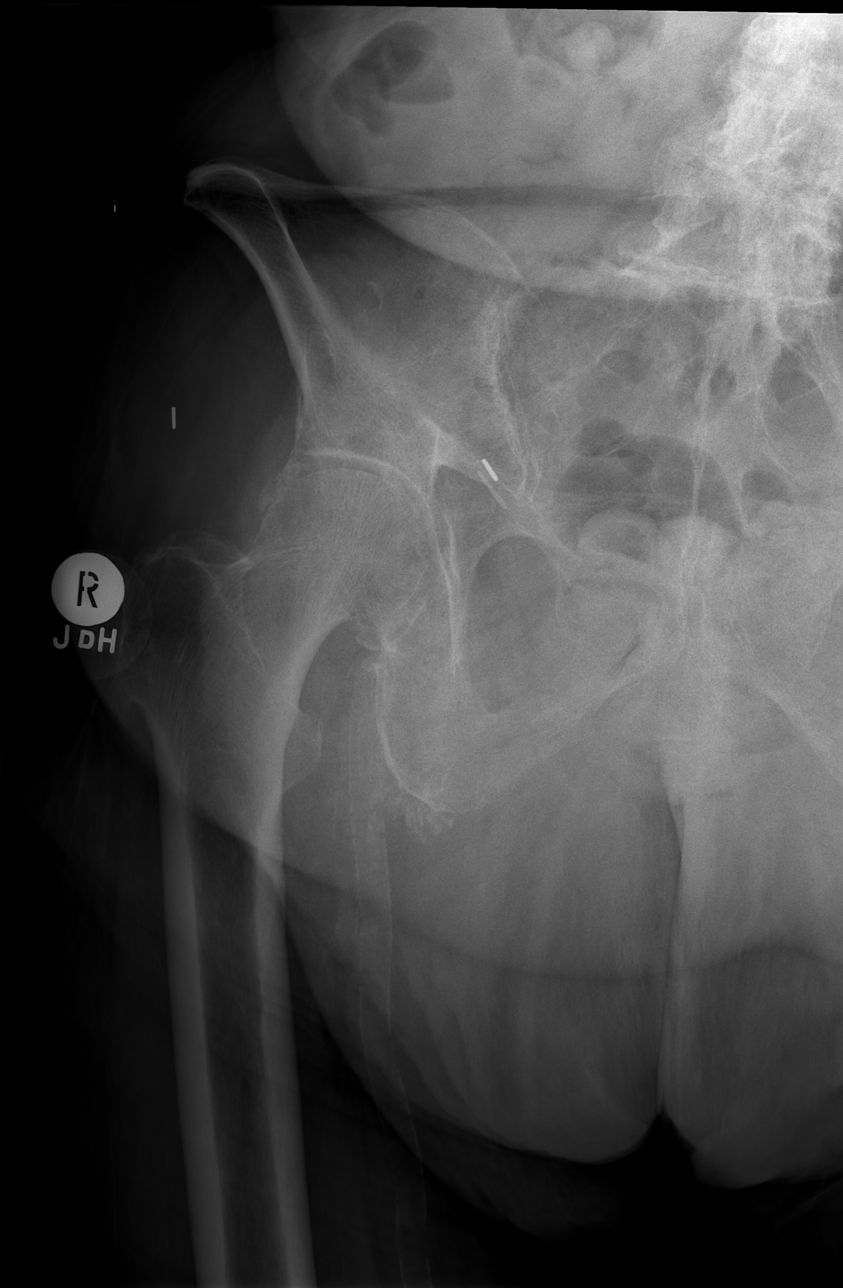

[w hip frog right]
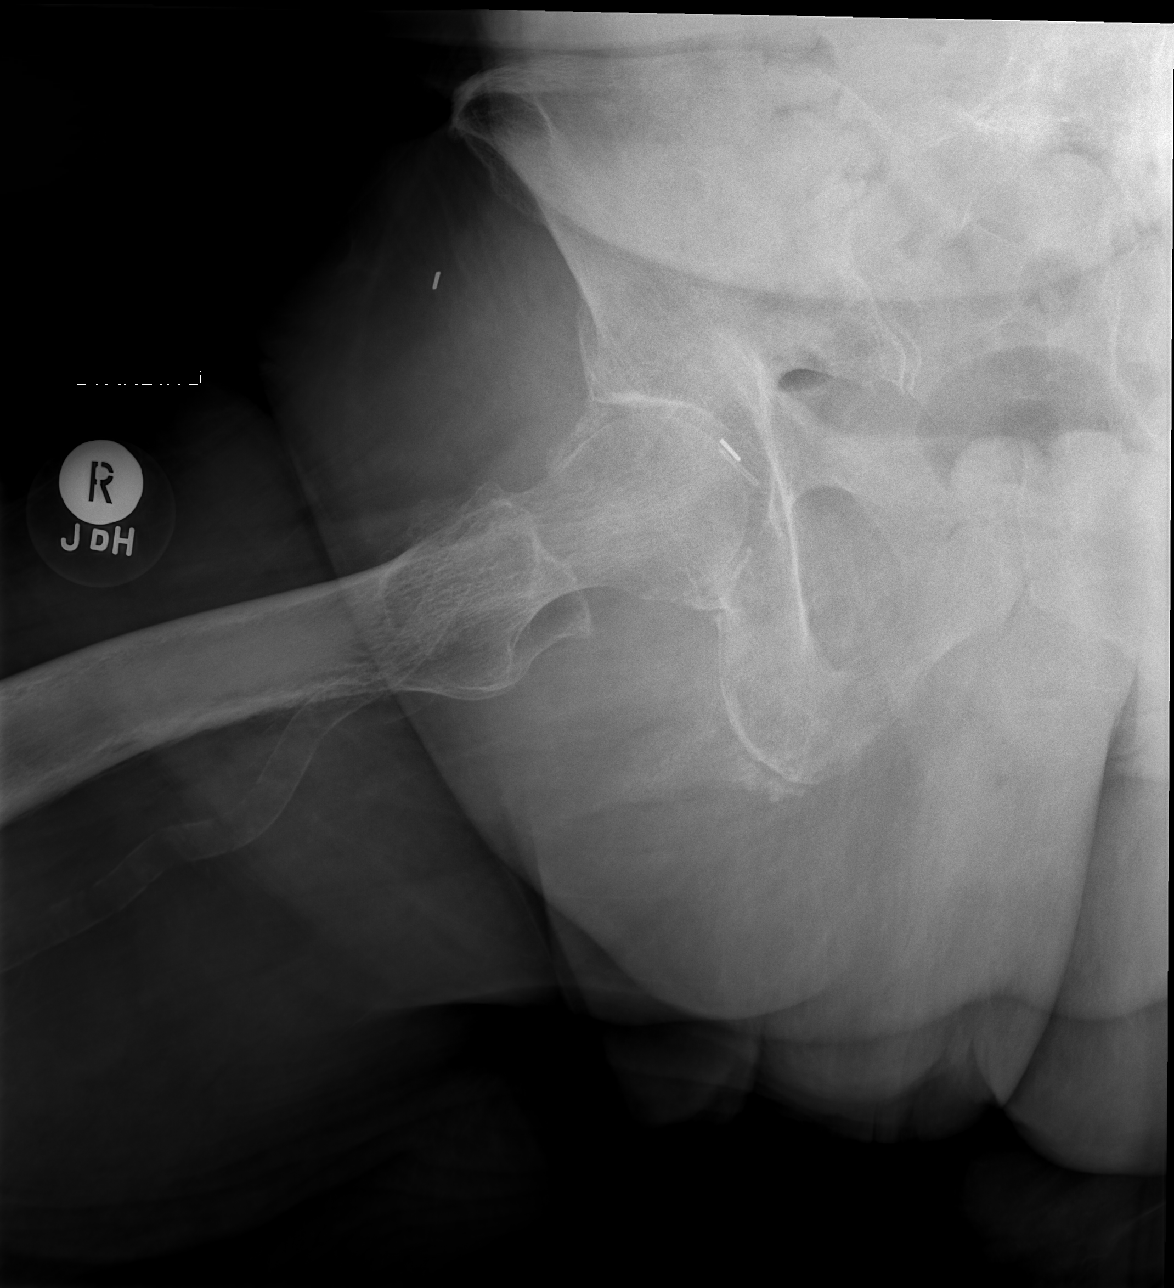

[4 of 4 positions shown; findings below may reference images not displayed]

FINDINGS: Bones are osteopenic. Hips appear symmetric and intact. No
malalignment or fracture by plain radiography. Degenerative changes
of the spine SI joints. Peripheral atherosclerosis noted.
IMPRESSION: Osteopenia and degenerative changes. No acute finding by plain
radiography

Peripheral atherosclerosis

## 2020-03-03 DIAGNOSIS — Z9181 History of falling: Secondary | ICD-10-CM | POA: Diagnosis not present

## 2020-03-03 DIAGNOSIS — Z952 Presence of prosthetic heart valve: Secondary | ICD-10-CM | POA: Diagnosis not present

## 2020-03-03 DIAGNOSIS — A419 Sepsis, unspecified organism: Secondary | ICD-10-CM | POA: Diagnosis not present

## 2020-03-03 DIAGNOSIS — D649 Anemia, unspecified: Secondary | ICD-10-CM | POA: Diagnosis not present

## 2020-03-03 DIAGNOSIS — G9341 Metabolic encephalopathy: Secondary | ICD-10-CM | POA: Diagnosis not present

## 2020-03-03 DIAGNOSIS — I2510 Atherosclerotic heart disease of native coronary artery without angina pectoris: Secondary | ICD-10-CM | POA: Diagnosis not present

## 2020-03-03 DIAGNOSIS — I4821 Permanent atrial fibrillation: Secondary | ICD-10-CM | POA: Diagnosis not present

## 2020-03-03 DIAGNOSIS — E039 Hypothyroidism, unspecified: Secondary | ICD-10-CM | POA: Diagnosis not present

## 2020-03-03 DIAGNOSIS — E559 Vitamin D deficiency, unspecified: Secondary | ICD-10-CM | POA: Diagnosis not present

## 2020-03-03 DIAGNOSIS — I10 Essential (primary) hypertension: Secondary | ICD-10-CM | POA: Diagnosis not present

## 2020-03-03 DIAGNOSIS — N3000 Acute cystitis without hematuria: Secondary | ICD-10-CM | POA: Diagnosis not present

## 2020-03-03 DIAGNOSIS — E785 Hyperlipidemia, unspecified: Secondary | ICD-10-CM | POA: Diagnosis not present

## 2020-03-03 DIAGNOSIS — E538 Deficiency of other specified B group vitamins: Secondary | ICD-10-CM | POA: Diagnosis not present

## 2020-03-03 DIAGNOSIS — E46 Unspecified protein-calorie malnutrition: Secondary | ICD-10-CM | POA: Diagnosis not present

## 2020-03-03 DIAGNOSIS — Z7901 Long term (current) use of anticoagulants: Secondary | ICD-10-CM | POA: Diagnosis not present

## 2020-03-03 DIAGNOSIS — G309 Alzheimer's disease, unspecified: Secondary | ICD-10-CM | POA: Diagnosis not present

## 2020-03-03 DIAGNOSIS — Z8673 Personal history of transient ischemic attack (TIA), and cerebral infarction without residual deficits: Secondary | ICD-10-CM | POA: Diagnosis not present

## 2020-03-03 DIAGNOSIS — Z96653 Presence of artificial knee joint, bilateral: Secondary | ICD-10-CM | POA: Diagnosis not present

## 2020-03-05 ENCOUNTER — Other Ambulatory Visit: Payer: Self-pay

## 2020-03-05 ENCOUNTER — Emergency Department (HOSPITAL_COMMUNITY): Payer: Medicare Other

## 2020-03-05 ENCOUNTER — Inpatient Hospital Stay (HOSPITAL_COMMUNITY)
Admission: EM | Admit: 2020-03-05 | Discharge: 2020-03-09 | DRG: 884 | Disposition: A | Discharge status: Hospice | Payer: Medicare Other | Attending: Internal Medicine | Admitting: Internal Medicine

## 2020-03-05 DIAGNOSIS — Y92009 Unspecified place in unspecified non-institutional (private) residence as the place of occurrence of the external cause: Secondary | ICD-10-CM

## 2020-03-05 DIAGNOSIS — R627 Adult failure to thrive: Secondary | ICD-10-CM | POA: Diagnosis present

## 2020-03-05 DIAGNOSIS — Z8249 Family history of ischemic heart disease and other diseases of the circulatory system: Secondary | ICD-10-CM

## 2020-03-05 DIAGNOSIS — I252 Old myocardial infarction: Secondary | ICD-10-CM

## 2020-03-05 DIAGNOSIS — Z20822 Contact with and (suspected) exposure to covid-19: Secondary | ICD-10-CM | POA: Diagnosis present

## 2020-03-05 DIAGNOSIS — E039 Hypothyroidism, unspecified: Secondary | ICD-10-CM | POA: Diagnosis present

## 2020-03-05 DIAGNOSIS — R531 Weakness: Secondary | ICD-10-CM | POA: Diagnosis not present

## 2020-03-05 DIAGNOSIS — R29818 Other symptoms and signs involving the nervous system: Secondary | ICD-10-CM | POA: Diagnosis not present

## 2020-03-05 DIAGNOSIS — E785 Hyperlipidemia, unspecified: Secondary | ICD-10-CM | POA: Diagnosis present

## 2020-03-05 DIAGNOSIS — Z87891 Personal history of nicotine dependence: Secondary | ICD-10-CM

## 2020-03-05 DIAGNOSIS — Z952 Presence of prosthetic heart valve: Secondary | ICD-10-CM | POA: Diagnosis not present

## 2020-03-05 DIAGNOSIS — Z743 Need for continuous supervision: Secondary | ICD-10-CM | POA: Diagnosis not present

## 2020-03-05 DIAGNOSIS — Z96652 Presence of left artificial knee joint: Secondary | ICD-10-CM | POA: Diagnosis present

## 2020-03-05 DIAGNOSIS — Z66 Do not resuscitate: Secondary | ICD-10-CM | POA: Diagnosis not present

## 2020-03-05 DIAGNOSIS — Z7189 Other specified counseling: Secondary | ICD-10-CM | POA: Diagnosis not present

## 2020-03-05 DIAGNOSIS — R42 Dizziness and giddiness: Secondary | ICD-10-CM | POA: Diagnosis not present

## 2020-03-05 DIAGNOSIS — R5381 Other malaise: Secondary | ICD-10-CM | POA: Diagnosis not present

## 2020-03-05 DIAGNOSIS — F0390 Unspecified dementia without behavioral disturbance: Secondary | ICD-10-CM | POA: Diagnosis present

## 2020-03-05 DIAGNOSIS — I4821 Permanent atrial fibrillation: Secondary | ICD-10-CM | POA: Diagnosis not present

## 2020-03-05 DIAGNOSIS — I10 Essential (primary) hypertension: Secondary | ICD-10-CM | POA: Diagnosis not present

## 2020-03-05 DIAGNOSIS — Z888 Allergy status to other drugs, medicaments and biological substances status: Secondary | ICD-10-CM | POA: Diagnosis not present

## 2020-03-05 DIAGNOSIS — I350 Nonrheumatic aortic (valve) stenosis: Secondary | ICD-10-CM | POA: Diagnosis not present

## 2020-03-05 DIAGNOSIS — W19XXXA Unspecified fall, initial encounter: Secondary | ICD-10-CM | POA: Diagnosis present

## 2020-03-05 DIAGNOSIS — F03C0 Unspecified dementia, severe, without behavioral disturbance, psychotic disturbance, mood disturbance, and anxiety: Secondary | ICD-10-CM | POA: Diagnosis present

## 2020-03-05 DIAGNOSIS — Z515 Encounter for palliative care: Secondary | ICD-10-CM | POA: Diagnosis not present

## 2020-03-05 DIAGNOSIS — G4733 Obstructive sleep apnea (adult) (pediatric): Secondary | ICD-10-CM | POA: Diagnosis not present

## 2020-03-05 DIAGNOSIS — Z955 Presence of coronary angioplasty implant and graft: Secondary | ICD-10-CM

## 2020-03-05 DIAGNOSIS — R0902 Hypoxemia: Secondary | ICD-10-CM | POA: Diagnosis not present

## 2020-03-05 DIAGNOSIS — F0150 Vascular dementia without behavioral disturbance: Principal | ICD-10-CM | POA: Diagnosis present

## 2020-03-05 DIAGNOSIS — N1831 Chronic kidney disease, stage 3a: Secondary | ICD-10-CM | POA: Diagnosis not present

## 2020-03-05 DIAGNOSIS — I2510 Atherosclerotic heart disease of native coronary artery without angina pectoris: Secondary | ICD-10-CM | POA: Diagnosis not present

## 2020-03-05 DIAGNOSIS — Z79899 Other long term (current) drug therapy: Secondary | ICD-10-CM

## 2020-03-05 DIAGNOSIS — G4730 Sleep apnea, unspecified: Secondary | ICD-10-CM | POA: Diagnosis present

## 2020-03-05 DIAGNOSIS — Z7901 Long term (current) use of anticoagulants: Secondary | ICD-10-CM | POA: Diagnosis not present

## 2020-03-05 DIAGNOSIS — Z833 Family history of diabetes mellitus: Secondary | ICD-10-CM

## 2020-03-05 DIAGNOSIS — D631 Anemia in chronic kidney disease: Secondary | ICD-10-CM | POA: Diagnosis present

## 2020-03-05 DIAGNOSIS — M810 Age-related osteoporosis without current pathological fracture: Secondary | ICD-10-CM | POA: Diagnosis present

## 2020-03-05 DIAGNOSIS — R000 Tachycardia, unspecified: Secondary | ICD-10-CM | POA: Diagnosis present

## 2020-03-05 DIAGNOSIS — R0602 Shortness of breath: Secondary | ICD-10-CM | POA: Diagnosis not present

## 2020-03-05 DIAGNOSIS — I6523 Occlusion and stenosis of bilateral carotid arteries: Secondary | ICD-10-CM | POA: Diagnosis not present

## 2020-03-05 DIAGNOSIS — Z79891 Long term (current) use of opiate analgesic: Secondary | ICD-10-CM

## 2020-03-05 DIAGNOSIS — I129 Hypertensive chronic kidney disease with stage 1 through stage 4 chronic kidney disease, or unspecified chronic kidney disease: Secondary | ICD-10-CM | POA: Diagnosis not present

## 2020-03-05 DIAGNOSIS — Z823 Family history of stroke: Secondary | ICD-10-CM

## 2020-03-05 DIAGNOSIS — R296 Repeated falls: Secondary | ICD-10-CM | POA: Diagnosis present

## 2020-03-05 DIAGNOSIS — Z79890 Hormone replacement therapy (postmenopausal): Secondary | ICD-10-CM

## 2020-03-05 DIAGNOSIS — I4819 Other persistent atrial fibrillation: Secondary | ICD-10-CM | POA: Diagnosis present

## 2020-03-05 DIAGNOSIS — Z8261 Family history of arthritis: Secondary | ICD-10-CM

## 2020-03-05 DIAGNOSIS — Z6838 Body mass index (BMI) 38.0-38.9, adult: Secondary | ICD-10-CM

## 2020-03-05 LAB — CBG MONITORING, ED: Glucose-Capillary: 91 mg/dL (ref 70–99)

## 2020-03-05 LAB — URINALYSIS, ROUTINE W REFLEX MICROSCOPIC
Bacteria, UA: NONE SEEN
Bilirubin Urine: NEGATIVE
Glucose, UA: NEGATIVE mg/dL
Hgb urine dipstick: NEGATIVE
Ketones, ur: NEGATIVE mg/dL
Nitrite: NEGATIVE
Protein, ur: NEGATIVE mg/dL
Specific Gravity, Urine: 1.026 (ref 1.005–1.030)
pH: 6 (ref 5.0–8.0)

## 2020-03-05 LAB — PROTIME-INR
INR: 2.3 — ABNORMAL HIGH (ref 0.8–1.2)
Prothrombin Time: 24.9 seconds — ABNORMAL HIGH (ref 11.4–15.2)

## 2020-03-05 LAB — CBC
HCT: 40.1 % (ref 36.0–46.0)
Hemoglobin: 12.1 g/dL (ref 12.0–15.0)
MCH: 28.5 pg (ref 26.0–34.0)
MCHC: 30.2 g/dL (ref 30.0–36.0)
MCV: 94.6 fL (ref 80.0–100.0)
Platelets: 182 10*3/uL (ref 150–400)
RBC: 4.24 MIL/uL (ref 3.87–5.11)
RDW: 14.2 % (ref 11.5–15.5)
WBC: 8.3 10*3/uL (ref 4.0–10.5)
nRBC: 0 % (ref 0.0–0.2)

## 2020-03-05 LAB — BASIC METABOLIC PANEL
Anion gap: 10 (ref 5–15)
BUN: 18 mg/dL (ref 8–23)
CO2: 24 mmol/L (ref 22–32)
Calcium: 8.6 mg/dL — ABNORMAL LOW (ref 8.9–10.3)
Chloride: 104 mmol/L (ref 98–111)
Creatinine, Ser: 1.16 mg/dL — ABNORMAL HIGH (ref 0.44–1.00)
GFR calc Af Amer: 50 mL/min — ABNORMAL LOW (ref >60)
GFR calc non Af Amer: 43 mL/min — ABNORMAL LOW (ref >60)
Glucose, Bld: 105 mg/dL — ABNORMAL HIGH (ref 70–99)
Potassium: 4.1 mmol/L (ref 3.5–5.1)
Sodium: 138 mmol/L (ref 135–145)

## 2020-03-05 MED ORDER — ATORVASTATIN CALCIUM 80 MG PO TABS
80.0000 mg | ORAL_TABLET | Freq: Every day | ORAL | Status: DC
  Administered 2020-03-07: 80 mg via ORAL
  Filled 2020-03-05: qty 1

## 2020-03-05 MED ORDER — WARFARIN SODIUM 2 MG PO TABS
2.0000 mg | ORAL_TABLET | ORAL | Status: DC
  Filled 2020-03-05: qty 1

## 2020-03-05 MED ORDER — LORAZEPAM 2 MG/ML IJ SOLN
2.0000 mg | Freq: Once | INTRAMUSCULAR | Status: AC
  Administered 2020-03-05: 2 mg via INTRAVENOUS
  Filled 2020-03-05: qty 1

## 2020-03-05 MED ORDER — SODIUM CHLORIDE 0.9 % IV BOLUS: 1000.0000 mL | Freq: Once | INTRAVENOUS | Status: DC

## 2020-03-05 MED ORDER — METOPROLOL TARTRATE 50 MG PO TABS
50.0000 mg | ORAL_TABLET | Freq: Two times a day (BID) | ORAL | Status: DC
  Administered 2020-03-07 (×2): 50 mg via ORAL
  Filled 2020-03-05 (×2): qty 1

## 2020-03-05 MED ORDER — WARFARIN - PHYSICIAN DOSING INPATIENT: Freq: Every day | Status: DC

## 2020-03-05 MED ORDER — SODIUM CHLORIDE 0.9 % IV BOLUS
1000.0000 mL | Freq: Once | INTRAVENOUS | Status: AC
  Administered 2020-03-05: 1000 mL via INTRAVENOUS

## 2020-03-05 MED ORDER — RISPERIDONE 0.5 MG PO TABS
0.5000 mg | ORAL_TABLET | Freq: Two times a day (BID) | ORAL | Status: DC
  Administered 2020-03-07 – 2020-03-09 (×3): 0.5 mg via ORAL
  Filled 2020-03-05 (×8): qty 1

## 2020-03-05 MED ORDER — PANTOPRAZOLE SODIUM 40 MG PO TBEC
40.0000 mg | DELAYED_RELEASE_TABLET | Freq: Every day | ORAL | Status: DC
  Administered 2020-03-07: 40 mg via ORAL
  Filled 2020-03-05: qty 1

## 2020-03-05 MED ORDER — POTASSIUM CHLORIDE CRYS ER 10 MEQ PO TBCR: 10.0000 meq | EXTENDED_RELEASE_TABLET | Freq: Every day | ORAL | Status: DC

## 2020-03-05 MED ORDER — LEVOTHYROXINE SODIUM 25 MCG PO TABS
137.0000 ug | ORAL_TABLET | Freq: Every day | ORAL | Status: DC
  Administered 2020-03-07: 137 ug via ORAL
  Filled 2020-03-05: qty 1

## 2020-03-05 MED ORDER — WARFARIN SODIUM 1 MG PO TABS
1.0000 mg | ORAL_TABLET | ORAL | Status: DC
  Filled 2020-03-05: qty 1

## 2020-03-05 MED ORDER — SODIUM CHLORIDE 0.9 % IV BOLUS
1000.0000 mL | Freq: Once | INTRAVENOUS | Status: DC
Start: 2020-03-05 — End: 2020-03-05

## 2020-03-05 MED ORDER — SODIUM CHLORIDE 0.9% FLUSH
3.0000 mL | Freq: Once | INTRAVENOUS | Status: AC
  Administered 2020-03-05: 3 mL via INTRAVENOUS

## 2020-03-05 MED ORDER — IOHEXOL 350 MG/ML SOLN
60.0000 mL | Freq: Once | INTRAVENOUS | Status: AC | PRN
  Administered 2020-03-05: 60 mL via INTRAVENOUS

## 2020-03-05 NOTE — ED Triage Notes (Signed)
Per [facility] pt from home, reports weakness and numerous falls x1 week. Per EMS when they stood her up she became dizzy and SOB   140/80 90 24 CBG 144 99.5

## 2020-03-05 NOTE — ED Notes (Signed)
Pt too weak to do orthostatic vitals. It took 2 people to lift her out of the wheelchair and get her into the bed.

## 2020-03-05 NOTE — ED Notes (Signed)
Pt put on purewick, told to inform us when she provides a urine sample.

## 2020-03-05 NOTE — ED Provider Notes (Signed)
[facility] EMERGENCY DEPARTMENT Provider Note   CSN: [id number] Arrival date & time: 03/05/20  1737     History Chief Complaint  Patient presents with  . Weakness    [name] is a 86 y.o. female history of CAD status post stents, vascular dementia, hypertension, hyperlipidemia, A. fib on Coumadin here presenting with fall, shortness of breath, weakness .  Patient states that she has been falling multiple times for the last week or so.  She states that she just felt too weak and unable to walk.  Patient states that she also has some shortness of breath with exertion as well.  Denies any chest pain.  Patient told me that she was seen here about a week ago for similar symptoms and was sent home.  However there was no record in our system or in care everywhere that she had a ED visit or primary care doctor visit recently.  She does have a history of dementia with sundowning.  The history is provided by the patient.  Level V caveat- dementia      Past Medical History:  Diagnosis Date  . Atrial fibrillation, persistent (HCC)    Atrial fibrillation  . CAD (coronary artery disease)    a. 01/2015 NSTEMI/PCI: LM nl, LAD 30p, LCX nondom, 30-40p, 50d, OM1 100p (2.75x16 Rebel BMS), RCA mild diff plaque, EF 55%.  . Cancer ([place])   . Candida infection, disseminated ([place])   . Dementia ([place])   . Hyperlipidemia   . Hypertension   . Hypothyroid   . Osteoporosis   . S/P TAVR (transcatheter aortic valve replacement)    s/p TAVR with 25mm Edwards S3U THV via the TF approach  . Severe aortic stenosis   . Sleep apnea 04/2012   Mild/ AHI 10/CPAP 7cm h2o w/2 L o2    Patient Active Problem List   Diagnosis Date Noted  . Acute upper GI bleed   . Sepsis ([place]) 01/12/2020  . AKI (acute kidney injury) ([place]) 01/12/2020  . Acute cystitis 01/12/2020  . Urinary retention 11/30/2019  . S/P TAVR (transcatheter aortic valve replacement)   . Severe aortic stenosis   . Dementia ([place])  09/16/2018  . Hypothyroidism 02/21/2015  . Persistent atrial fibrillation ([place])   . CAD (coronary artery disease)   . Hyperlipidemia   . Essential hypertension   . Osteoporosis   . Sleep apnea 04/29/2012    Past Surgical History:  Procedure Laterality Date  . CARDIAC CATHETERIZATION  11/10/2019   RIGHT/LEFT HEART CATH AND CORONARY ANGIOGRAPHY   . CARDIOVERSION N/A 08/22/2015   Procedure: CARDIOVERSION;  Surgeon: Skeet Latch, MD;  Location: [facility];  Service: Cardiovascular;  Laterality: N/A;  . JOINT REPLACEMENT Left    knee  . KNEE SURGERY    . LEFT HEART CATHETERIZATION WITH CORONARY ANGIOGRAM N/A 02/20/2015   Procedure: LEFT HEART CATHETERIZATION WITH CORONARY ANGIOGRAM;  Surgeon: Jolaine Artist, MD;  Location: [facility] CATH LAB;  Service: Cardiovascular;  Laterality: N/A;  . PERCUTANEOUS CORONARY STENT INTERVENTION (PCI-S)  02/20/2015   Procedure: PERCUTANEOUS CORONARY STENT INTERVENTION (PCI-S);  Surgeon: Jolaine Artist, MD;  Location: [facility] CATH LAB;  Service: Cardiovascular;;  OM1  (2.75/42mm Rebel)  . RIGHT/LEFT HEART CATH AND CORONARY ANGIOGRAPHY N/A 11/10/2019   Procedure: RIGHT/LEFT HEART CATH AND CORONARY ANGIOGRAPHY;  Surgeon: Burnell Blanks, MD;  Location: [facility] CV LAB;  Service: Cardiovascular;  Laterality: N/A;  . TEE WITHOUT CARDIOVERSION N/A 11/30/2019   Procedure: TRANSESOPHAGEAL ECHOCARDIOGRAM (TEE);  Surgeon:  Burnell Blanks, MD;  Location: [facility];  Service: Open Heart Surgery;  Laterality: N/A;  . TONSILLECTOMY    . TRANSCATHETER AORTIC VALVE REPLACEMENT, TRANSFEMORAL N/A 11/30/2019   Procedure: TRANSCATHETER AORTIC VALVE REPLACEMENT, TRANSFEMORAL;  Surgeon: Burnell Blanks, MD;  Location: [facility];  Service: Open Heart Surgery;  Laterality: N/A;     OB History   No obstetric history on file.     Family History  Problem Relation Age of Onset  . Coronary artery disease Father   . Diabetes Father   . Stroke Mother   .  Arthritis Mother     Social History   Tobacco Use  . Smoking status: Former Smoker    Quit date: 04/28/1995    Years since quitting: 24.8  . Smokeless tobacco: Never Used  Substance Use Topics  . Alcohol use: No    Alcohol/week: 0.0 standard drinks  . Drug use: No    Home Medications Prior to Admission medications   Medication Sig Start Date End Date Taking? Authorizing Provider  ALPRAZolam Duanne Moron) 0.5 MG tablet Take 1 tablet (0.5 mg total) by mouth at bedtime as needed for sleep. 12/13/19   [name], MD  atorvastatin (LIPITOR) 80 MG tablet Take 80 mg by mouth daily.    [provider]  cholecalciferol (VITAMIN D) 1000 UNITS tablet Take 1,000 Units by mouth daily.    [provider]  cyanocobalamin 1000 MCG tablet Take 1,000 mcg by mouth daily.     [provider]  furosemide (LASIX) 20 MG tablet Take 1 tablet (20 mg total) by mouth daily. 02/29/20   [name], MD  levothyroxine (SYNTHROID) 137 MCG tablet Take 137 mcg by mouth daily before breakfast.    [provider]  metoprolol tartrate (LOPRESSOR) 50 MG tablet Take 50 mg by mouth 2 (two) times daily.    [provider]  Multiple Vitamins-Minerals (MULTIVITAMIN WITH MINERALS) tablet Take 1 tablet by mouth 2 (two) times daily.    [provider]  pantoprazole (PROTONIX) 40 MG tablet Take 1 tablet (40 mg total) by mouth daily. 01/15/20   Shelly Coss, MD  potassium chloride (KLOR-CON) 10 MEQ tablet Take 1 tablet (10 mEq total) by mouth daily. 02/29/20   [name], MD  RISPERDAL 0.5 MG tablet TAKE 1 TABLET BY MOUTH TWICE A DAY 03/02/20   [name], MD  traMADol (ULTRAM) 50 MG tablet Take 1 tablet (50 mg total) by mouth every 8 (eight) hours as needed. 11/02/18   [name], MD  warfarin (COUMADIN) 1 MG tablet Take 2 mg by mouth daily. Take 2 tablets daily or as directed by Anticoagulation Clinic.    [provider]    Allergies      Fosamax [alendronate sodium]  Review of Systems   Review of Systems  Neurological: Positive for weakness.  All other systems reviewed and are negative.   Physical Exam Updated Vital Signs BP (!) 146/78   Pulse 96   Temp 98 F (36.7 C) (Oral)   Resp 20   SpO2 98%   Physical Exam Vitals and nursing note reviewed.  Constitutional:      Comments: Chronically ill   HENT:     Head: Normocephalic.     Nose: Nose normal.     Mouth/Throat:     Mouth: Mucous membranes are moist.  Eyes:     Extraocular Movements: Extraocular movements intact.     Pupils: Pupils are equal, round, and  reactive to light.  Cardiovascular:     Rate and Rhythm: Normal rate and regular rhythm.     Pulses: Normal pulses.     Heart sounds: Normal heart sounds.  Pulmonary:     Effort: Pulmonary effort is normal.     Breath sounds: Normal breath sounds.  Abdominal:     General: Abdomen is flat.     Palpations: Abdomen is soft.  Musculoskeletal:        General: Normal range of motion.     Cervical back: Normal range of motion.  Skin:    General: Skin is warm.     Capillary Refill: Capillary refill takes less than 2 seconds.  Neurological:     Comments: Confused, A & O x 2. Moving all extremities. Strength 3/5 bilateral arms and legs. Has resting tremors. Unable to do finger to nose due to weakness. Unable to stand due to weakness   Psychiatric:        Mood and Affect: Mood normal.     ED Results / Procedures / Treatments   Labs (all labs ordered are listed, but only abnormal results are displayed) Labs Reviewed  BASIC METABOLIC PANEL - Abnormal; Notable for the following components:      Result Value   Glucose, Bld 105 (*)    Creatinine, Ser 1.16 (*)    Calcium 8.6 (*)    GFR calc non Af Amer 43 (*)    GFR calc Af Amer 50 (*)    All other components within normal limits  PROTIME-INR - Abnormal; Notable for the following components:   Prothrombin Time 24.9 (*)    INR 2.3 (*)    All other  components within normal limits  CBC  URINALYSIS, ROUTINE W REFLEX MICROSCOPIC  CBG MONITORING, ED    EKG EKG Interpretation  Date/Time:  Sunday March 05 2020 17:44:55 EST Ventricular Rate:  92 PR Interval:    QRS Duration: 124 QT Interval:  372 QTC Calculation: 460 R Axis:   -41 Text Interpretation: Atrial fibrillation Left axis deviation Right bundle branch block Anterior infarct , age undetermined T wave abnormality, consider lateral ischemia Abnormal ECG No significant change since last tracing Confirmed by Wandra Arthurs [phone]) on 03/05/2020 6:26:21 PM   Radiology DG Chest Port 1 View  Result Date: 03/05/2020 CLINICAL DATA:  Shortness of breath, weakness and numerous falls EXAM: PORTABLE CHEST 1 VIEW COMPARISON:  Radiograph 11/30/2019 FINDINGS: Aortic valve stent graft repair is in stable position from comparison radiograph. Stable mild cardiomegaly with a calcified aorta. No consolidation, features of edema, pneumothorax, or effusion. No acute osseous or soft tissue abnormality. No acute osseous or soft tissue abnormality. Degenerative changes are present in the imaged spine and shoulders. The osseous structures appear diffusely demineralized which may limit detection of small or nondisplaced fractures. IMPRESSION: No acute cardiopulmonary abnormality. Stable mild cardiomegaly. Stable positioning of a transcatheter aortic valve repair. Electronically Signed   By: Lovena Le M.D.   On: 03/05/2020 18:57    Procedures Procedures (including critical care time)  Medications Ordered in ED Medications  sodium chloride 0.9 % bolus 1,000 mL (has no administration in time range)  sodium chloride flush (NS) 0.9 % injection 3 mL (3 mLs Intravenous Given 03/05/20 1922)  iohexol (OMNIPAQUE) 350 MG/ML injection 60 mL (60 mLs Intravenous Contrast Given 03/05/20 1926)    ED Course  I have reviewed the triage vital signs and the nursing notes.  Pertinent labs & imaging results that were available  during my care of the patient were reviewed by me and considered in my medical decision making (see chart for details).    MDM Rules/Calculators/A&P                      SHEKELIA AGOPIAN is a 86 y.o. female here with weakness. Has hx of vascular dementia so concerned for worsening dementia. Also consider UTI vs pneumonia vs electrolyte abnormality. Will get labs, CTA head/neck, MRI brain, UA.   11:14 PM CTA showed cerebral aneurysm with no rupture. There is internal carotid plaques.  There is no stroke on MRI brain.  UA and chest x-ray is clear.  I talked to daughter who lives with her.  Patient is ambulatory at baseline and since yesterday has been very weak and have trouble walking.  I talked to Dr. Leonel Ramsay from neurology.  He will see her and does recommend admission.  I talked to Dr. Shanon Brow from [facility]. She requested that Dr. Leonel Ramsay see her first and write down recommendations.   11:43 PM Dr. Leonel Ramsay saw patient and talked to family. Seems like patient is slowly declining over several months. Daughter already has PT/OT and home care. She request rehab placement. Will hold in the ED for placement as she doesn't meet admission criteria. Home meds ordered   Final Clinical Impression(s) / ED Diagnoses Final diagnoses:  None    Rx / DC Orders ED Discharge Orders    None       Drenda Freeze, MD 03/05/20 [phone]

## 2020-03-06 DIAGNOSIS — F0390 Unspecified dementia without behavioral disturbance: Secondary | ICD-10-CM | POA: Diagnosis present

## 2020-03-06 DIAGNOSIS — Z96652 Presence of left artificial knee joint: Secondary | ICD-10-CM | POA: Diagnosis present

## 2020-03-06 DIAGNOSIS — R531 Weakness: Secondary | ICD-10-CM | POA: Diagnosis present

## 2020-03-06 DIAGNOSIS — I2510 Atherosclerotic heart disease of native coronary artery without angina pectoris: Secondary | ICD-10-CM | POA: Diagnosis present

## 2020-03-06 DIAGNOSIS — F0150 Vascular dementia without behavioral disturbance: Secondary | ICD-10-CM | POA: Diagnosis present

## 2020-03-06 DIAGNOSIS — E785 Hyperlipidemia, unspecified: Secondary | ICD-10-CM | POA: Diagnosis present

## 2020-03-06 DIAGNOSIS — Z955 Presence of coronary angioplasty implant and graft: Secondary | ICD-10-CM | POA: Diagnosis not present

## 2020-03-06 DIAGNOSIS — N1831 Chronic kidney disease, stage 3a: Secondary | ICD-10-CM | POA: Diagnosis present

## 2020-03-06 DIAGNOSIS — R5381 Other malaise: Secondary | ICD-10-CM | POA: Diagnosis present

## 2020-03-06 DIAGNOSIS — M810 Age-related osteoporosis without current pathological fracture: Secondary | ICD-10-CM | POA: Diagnosis present

## 2020-03-06 DIAGNOSIS — Z7189 Other specified counseling: Secondary | ICD-10-CM | POA: Diagnosis not present

## 2020-03-06 DIAGNOSIS — I129 Hypertensive chronic kidney disease with stage 1 through stage 4 chronic kidney disease, or unspecified chronic kidney disease: Secondary | ICD-10-CM | POA: Diagnosis present

## 2020-03-06 DIAGNOSIS — Y92009 Unspecified place in unspecified non-institutional (private) residence as the place of occurrence of the external cause: Secondary | ICD-10-CM | POA: Diagnosis not present

## 2020-03-06 DIAGNOSIS — Z20822 Contact with and (suspected) exposure to covid-19: Secondary | ICD-10-CM | POA: Diagnosis present

## 2020-03-06 DIAGNOSIS — F03C0 Unspecified dementia, severe, without behavioral disturbance, psychotic disturbance, mood disturbance, and anxiety: Secondary | ICD-10-CM | POA: Diagnosis present

## 2020-03-06 DIAGNOSIS — M2550 Pain in unspecified joint: Secondary | ICD-10-CM | POA: Diagnosis not present

## 2020-03-06 DIAGNOSIS — I4821 Permanent atrial fibrillation: Secondary | ICD-10-CM | POA: Diagnosis present

## 2020-03-06 DIAGNOSIS — I10 Essential (primary) hypertension: Secondary | ICD-10-CM | POA: Diagnosis not present

## 2020-03-06 DIAGNOSIS — Z6838 Body mass index (BMI) 38.0-38.9, adult: Secondary | ICD-10-CM | POA: Diagnosis not present

## 2020-03-06 DIAGNOSIS — E039 Hypothyroidism, unspecified: Secondary | ICD-10-CM | POA: Diagnosis present

## 2020-03-06 DIAGNOSIS — W19XXXA Unspecified fall, initial encounter: Secondary | ICD-10-CM | POA: Diagnosis present

## 2020-03-06 DIAGNOSIS — I959 Hypotension, unspecified: Secondary | ICD-10-CM | POA: Diagnosis not present

## 2020-03-06 DIAGNOSIS — Z66 Do not resuscitate: Secondary | ICD-10-CM | POA: Diagnosis present

## 2020-03-06 DIAGNOSIS — Z888 Allergy status to other drugs, medicaments and biological substances status: Secondary | ICD-10-CM | POA: Diagnosis not present

## 2020-03-06 DIAGNOSIS — Z743 Need for continuous supervision: Secondary | ICD-10-CM | POA: Diagnosis not present

## 2020-03-06 DIAGNOSIS — Z79890 Hormone replacement therapy (postmenopausal): Secondary | ICD-10-CM | POA: Diagnosis not present

## 2020-03-06 DIAGNOSIS — R627 Adult failure to thrive: Secondary | ICD-10-CM | POA: Diagnosis not present

## 2020-03-06 DIAGNOSIS — I350 Nonrheumatic aortic (valve) stenosis: Secondary | ICD-10-CM | POA: Diagnosis present

## 2020-03-06 DIAGNOSIS — Z7401 Bed confinement status: Secondary | ICD-10-CM | POA: Diagnosis not present

## 2020-03-06 DIAGNOSIS — Z7901 Long term (current) use of anticoagulants: Secondary | ICD-10-CM | POA: Diagnosis not present

## 2020-03-06 DIAGNOSIS — I252 Old myocardial infarction: Secondary | ICD-10-CM | POA: Diagnosis not present

## 2020-03-06 DIAGNOSIS — G4733 Obstructive sleep apnea (adult) (pediatric): Secondary | ICD-10-CM | POA: Diagnosis present

## 2020-03-06 DIAGNOSIS — Z515 Encounter for palliative care: Secondary | ICD-10-CM | POA: Diagnosis not present

## 2020-03-06 DIAGNOSIS — Z952 Presence of prosthetic heart valve: Secondary | ICD-10-CM | POA: Diagnosis not present

## 2020-03-06 LAB — TSH: TSH: 2.832 u[IU]/mL (ref 0.350–4.500)

## 2020-03-06 LAB — POCT I-STAT 7, (LYTES, BLD GAS, ICA,H+H)
Acid-Base Excess: 2 mmol/L (ref 0.0–2.0)
Bicarbonate: 26.2 mmol/L (ref 20.0–28.0)
Calcium, Ion: 1.2 mmol/L (ref 1.15–1.40)
HCT: 32 % — ABNORMAL LOW (ref 36.0–46.0)
Hemoglobin: 10.9 g/dL — ABNORMAL LOW (ref 12.0–15.0)
O2 Saturation: 94 %
Patient temperature: 98
Potassium: 3.8 mmol/L (ref 3.5–5.1)
Sodium: 139 mmol/L (ref 135–145)
TCO2: 27 mmol/L (ref 22–32)
pCO2 arterial: 37.6 mmHg (ref 32.0–48.0)
pH, Arterial: 7.449 (ref 7.350–7.450)
pO2, Arterial: 66 mmHg — ABNORMAL LOW (ref 83.0–108.0)

## 2020-03-06 LAB — PROTIME-INR
INR: 2.6 — ABNORMAL HIGH (ref 0.8–1.2)
Prothrombin Time: 28 seconds — ABNORMAL HIGH (ref 11.4–15.2)

## 2020-03-06 LAB — AMMONIA: Ammonia: 10 umol/L (ref 9–35)

## 2020-03-06 LAB — CBG MONITORING, ED: Glucose-Capillary: 97 mg/dL (ref 70–99)

## 2020-03-06 LAB — SARS CORONAVIRUS 2 (TAT 6-24 HRS): SARS Coronavirus 2: NEGATIVE

## 2020-03-06 MED ORDER — ACETAMINOPHEN 325 MG PO TABS: 650.0000 mg | ORAL_TABLET | Freq: Four times a day (QID) | ORAL | Status: DC | PRN

## 2020-03-06 MED ORDER — ONDANSETRON HCL 4 MG PO TABS: 4.0000 mg | ORAL_TABLET | Freq: Four times a day (QID) | ORAL | Status: DC | PRN

## 2020-03-06 MED ORDER — ACETAMINOPHEN 650 MG RE SUPP: 650.0000 mg | Freq: Four times a day (QID) | RECTAL | Status: DC | PRN

## 2020-03-06 MED ORDER — ALPRAZOLAM 0.5 MG PO TABS: 0.5000 mg | ORAL_TABLET | Freq: Every evening | ORAL | Status: DC | PRN

## 2020-03-06 MED ORDER — DOCUSATE SODIUM 100 MG PO CAPS
100.0000 mg | ORAL_CAPSULE | Freq: Two times a day (BID) | ORAL | Status: DC
  Administered 2020-03-07 (×2): 100 mg via ORAL
  Filled 2020-03-06 (×3): qty 1

## 2020-03-06 MED ORDER — CHLORHEXIDINE GLUCONATE 0.12 % MT SOLN
15.0000 mL | Freq: Two times a day (BID) | OROMUCOSAL | Status: DC
  Administered 2020-03-07 – 2020-03-09 (×5): 15 mL via OROMUCOSAL
  Filled 2020-03-06 (×4): qty 15

## 2020-03-06 MED ORDER — SODIUM CHLORIDE 0.9% FLUSH
3.0000 mL | Freq: Two times a day (BID) | INTRAVENOUS | Status: DC
  Administered 2020-03-08: 3 mL via INTRAVENOUS

## 2020-03-06 MED ORDER — ONDANSETRON HCL 4 MG/2ML IJ SOLN
4.0000 mg | Freq: Four times a day (QID) | INTRAMUSCULAR | Status: DC | PRN
  Administered 2020-03-08 (×2): 4 mg via INTRAVENOUS
  Filled 2020-03-06 (×3): qty 2

## 2020-03-06 MED ORDER — TRAMADOL HCL 50 MG PO TABS
50.0000 mg | ORAL_TABLET | Freq: Two times a day (BID) | ORAL | Status: DC | PRN
  Administered 2020-03-07 – 2020-03-08 (×2): 50 mg via ORAL
  Filled 2020-03-06 (×2): qty 1

## 2020-03-06 MED ORDER — ORAL CARE MOUTH RINSE
15.0000 mL | Freq: Two times a day (BID) | OROMUCOSAL | Status: DC
  Administered 2020-03-07 – 2020-03-09 (×5): 15 mL via OROMUCOSAL

## 2020-03-06 MED ORDER — METOPROLOL TARTRATE 5 MG/5ML IV SOLN
5.0000 mg | Freq: Once | INTRAVENOUS | Status: AC
  Administered 2020-03-06: 5 mg via INTRAVENOUS
  Filled 2020-03-06: qty 5

## 2020-03-06 MED ORDER — LACTATED RINGERS IV SOLN: INTRAVENOUS | Status: DC

## 2020-03-06 NOTE — Progress Notes (Addendum)
Alerted Dr. Lorin Mercy that patient is on floor. Patient is not alert or oriented. Will be unable to take oral medications. MD acknowledged. Will continue to monitor.   Completed as much of admission as possible with patient being difficult to arouse and not oriented or alert.

## 2020-03-06 NOTE — Consult Note (Signed)
Neurology Consultation Reason for Consult: Generalized Weakness Referring Physician: Darl Householder, D  CC: Generalized weakness  History is obtained from: Daughter  HPI: [name] is a 86 y.o. female with a history of vascular dementia which has been getting progressively worse over the past few years who presents with generalized decline over the past few months.  Her daughter notes the beginning of her decline to mid January.  She had a decline in her functional status at that point, and was found to have anemia with a hemoglobin of 8, leukocytosis.  She was discharged to skilled nursing facility where her daughter reports that she was a 2 person assist.  When she first was discharged from the facility, she needed people to help put her into the car, however over the next few days, she began eating slightly better and appeared to perk up.  During that timeframe, she was able to walk with a walker.  Over the next few weeks, however, she has progressively declined.  Her daughter notes that when PT/OT come to work with her at the house she is able to do what is asked of her, but when the daughter asks her to do similar exercises, the patient refuses.  This is progressed to the point where she is no longer eating very well, and now is no longer even getting up to go to the bathroom.  As part of her work-up in the emergency department, an MRI of her brain was performed and to obtain the MRI she was given Ativan.  She is currently very much under the effects of this medication and therefore I am unable to obtain any history from her and the exam is limited.   ROS:  Unable to obtain due to altered mental status.   Past Medical History:  Diagnosis Date  . Atrial fibrillation, persistent (HCC)    Atrial fibrillation  . CAD (coronary artery disease)    a. 01/2015 NSTEMI/PCI: LM nl, LAD 30p, LCX nondom, 30-40p, 50d, OM1 100p (2.75x16 Rebel BMS), RCA mild diff plaque, EF 55%.  . Cancer ([place])   . Candida  infection, disseminated ([place])   . Dementia ([place])   . Hyperlipidemia   . Hypertension   . Hypothyroid   . Osteoporosis   . S/P TAVR (transcatheter aortic valve replacement)    s/p TAVR with 110mm Edwards S3U THV via the TF approach  . Severe aortic stenosis   . Sleep apnea 04/2012   Mild/ AHI 10/CPAP 7cm h2o w/2 L o2     Family History  Problem Relation Age of Onset  . Coronary artery disease Father   . Diabetes Father   . Stroke Mother   . Arthritis Mother      Social History:  reports that she quit smoking about 24 years ago. She has never used smokeless tobacco. She reports that she does not drink alcohol or use drugs.   Exam: Current vital signs: BP (!) 146/74   Pulse 96   Temp 98 F (36.7 C) (Oral)   Resp 18   SpO2 98%  Vital signs in last 24 hours: Temp:  [98 F (36.7 C)] 98 F (36.7 C) (03/07 1743) Pulse Rate:  [89-101] 96 (03/07 2345) Resp:  [18-36] 18 (03/07 2345) BP: (123-147)/(58-103) 146/74 (03/07 2330) SpO2:  [94 %-100 %] 98 % (03/07 2345)   Physical Exam  Constitutional: Appears well-developed and well-nourished.  Psych: Does not interact Eyes: No scleral injection HENT: No OP obstrucion MSK: no joint deformities.  Cardiovascular: Normal rate and regular rhythm.  Respiratory: Effort normal, non-labored breathing GI: Soft.  No distension. There is no tenderness.  Skin: WDI  Neuro: Mental Status: Patient is obtunded, she does not open eyes, but she does grimace to noxious stimuli. Cranial Nerves: II: She does not blink to threat pupils are equal, round, and reactive to light.   III,IV, VI: Doll's eye maneuver intact V: VII: She blinks to eyelid stimulation bilaterally Motor: She withdraws to noxious simulation in all 4 extremities, lower extremities are externally rotated bilaterally suggesting some weakness. Sensory: As above DTR: 2+ and symmetric at the biceps and patella Cerebellar: Does not perform.      I have reviewed labs in  epic and the results pertinent to this consultation are: Borderline creatinine at 1.16  I have reviewed the images obtained: MRI brain-extensive old findings, but no acute disease.  Impression: 86 year old female with dementia with progressive multifactorial decline.  From the description that the daughter gives me, it sounds like a lot of this is motivational, but am unable to get a good history from the patient due to her sedation.  With intact reflexes, I do not suspect a peripheral process such as Guyon Barr, and with negative MRI of the brain, ischemic stroke is ruled out.  No myelopathic findings on exam to suggest central pathology either.  I do think assessment for thyroid dysfunction or hyperammonemia rule out some causes of generalized decline.  Recommendations: 1) TSH, ammonia   Roland Rack, MD [facility] [phone]  If 7pm- 7am, please page neurology on call as listed in [place].

## 2020-03-06 NOTE — Evaluation (Signed)
Physical Therapy Evaluation and Discharge Patient Details Name: [name] MRN: [id number] DOB: 04-10-1933 Today's Date: 03/06/2020   History of Present Illness  Pt is an 86 y/o female admitted secondary to advanced dementia with progressive decline. PMH includes vascular dementia, CAD, a fib, HTN, and s/p TAVR.   Clinical Impression  Pt admitted secondary to problem above with deficits below. Pt with very limited participation and noticeable cognitive deficits. Required total A to roll, however, caused obvious pain as pt was moaning throughout. Per notes, likely will d/c with hospice services. If family cannot provide necessary assist and pt does not go for inpatient hospice, will likely require SNF level care. Further needs can be met in next venue. Will sign off. If needs change, please re-consult.     Follow Up Recommendations Other (comment);Supervision/Assistance - 24 hour(hospice services)    Equipment Recommendations  Wheelchair cushion (measurements PT);Wheelchair (measurements PT);Hospital bed(hoyer lift with pad; if pt d/c's home)    Recommendations for Other Services       Precautions / Restrictions Precautions Precautions: Fall Precaution Comments: Per notes, has fallen multiple times.  Restrictions Weight Bearing Restrictions: No      Mobility  Bed Mobility Overal bed mobility: Needs Assistance Bed Mobility: Rolling Rolling: Total assist         General bed mobility comments: Total A to roll from side to side. pt grimacing and moaning throughout so further mobility deferred.   Transfers                 General transfer comment: unable  Ambulation/Gait                Stairs            Wheelchair Mobility    Modified Rankin (Stroke Patients Only)       Balance Overall balance assessment: History of Falls                                           Pertinent Vitals/Pain Pain Assessment: Faces Faces Pain  Scale: Hurts whole lot Pain Location: bilateral hips  Pain Descriptors / Indicators: Grimacing;Guarding Pain Intervention(s): Limited activity within patient's tolerance;Monitored during session;Repositioned    Home Living Family/patient expects to be discharged to:: Private residence Living Arrangements: Children Available Help at Discharge: Family;Friend(s) Type of Home: House       Home Layout: One level Home Equipment: Walker - 2 wheels Additional Comments: Information taken from previous note, as pt unable to provide.     Prior Function Level of Independence: Needs assistance   Gait / Transfers Assistance Needed: Per notes, pt has had progressive decline with multiple falls. Unable to get baseline information given pt's cognitive deficits.            Hand Dominance        Extremity/Trunk Assessment   Upper Extremity Assessment Upper Extremity Assessment: Generalized weakness;Difficult to assess due to impaired cognition    Lower Extremity Assessment Lower Extremity Assessment: Generalized weakness;Difficult to assess due to impaired cognition       Communication   Communication: Expressive difficulties(very garbled speech )  Cognition Arousal/Alertness: Lethargic Behavior During Therapy: Flat affect Overall Cognitive Status: No family/caregiver present to determine baseline cognitive functioning  General Comments: Pt did not open eyes throughout, even when instructed. Pt moaning with mobility tasks. Very garbled speech. Inconsistently followed commands.       General Comments General comments (skin integrity, edema, etc.): No family present     Exercises     Assessment/Plan    PT Assessment All further PT needs can be met in the next venue of care  PT Problem List Decreased strength;Decreased activity tolerance;Decreased balance;Decreased mobility;Decreased knowledge of use of DME;Decreased  cognition;Decreased safety awareness;Decreased knowledge of precautions;Pain       PT Treatment Interventions      PT Goals (Current goals can be found in the Care Plan section)  Acute Rehab PT Goals PT Goal Formulation: Patient unable to participate in goal setting Time For Goal Achievement: 03/06/20 Potential to Achieve Goals: Fair    Frequency     Barriers to discharge        Co-evaluation               AM-PAC PT "6 Clicks" Mobility  Outcome Measure Help needed turning from your back to your side while in a flat bed without using bedrails?: Total Help needed moving from lying on your back to sitting on the side of a flat bed without using bedrails?: Total Help needed moving to and from a bed to a chair (including a wheelchair)?: Total Help needed standing up from a chair using your arms (e.g., wheelchair or bedside chair)?: Total Help needed to walk in hospital room?: Total Help needed climbing 3-5 steps with a railing? : Total 6 Click Score: 6    End of Session   Activity Tolerance: Patient limited by pain Patient left: in bed;with call bell/phone within reach Nurse Communication: Mobility status PT Visit Diagnosis: Unsteadiness on feet (R26.81);Difficulty in walking, not elsewhere classified (R26.2);Muscle weakness (generalized) (M62.81);History of falling (Z91.81)    Time: 3403-7096 PT Time Calculation (min) (ACUTE ONLY): 16 min   Charges:   PT Evaluation $PT Eval Moderate Complexity: 1 Mod         Reuel Derby, PT, DPT  Acute Rehabilitation Services  Pager: [phone] Office: [phone]   Rudean Hitt 03/06/2020, 4:11 PM

## 2020-03-06 NOTE — Discharge Planning (Signed)
RNCM spoke with Bevely Palmer of [facility].  Bevely Palmer has been in contact with the family forming a disposition plan.

## 2020-03-06 NOTE — H&P (Signed)
History and Physical    [name] [name] DOB: 1933/11/23 DOA: 03/05/2020  PCP: [name], MD Consultants:  [facility] - cardiology Patient coming from: Home - lives with daughter; NOK: [name], [phone]  Chief Complaint: Weakness  HPI: [name] is a 86 y.o. female with medical history significant of OSA on CPAP; s/p TAVR; hypothyroidism; HTN; HLD; dementia; afib on Coumadin; and CAD s/p stent presenting with weakness.  She is currently somnolent, oriented to person only.  She was previously admitted from 1/13-19 for worsening dementia, ABLA with apparent UGI bleeding with a supratherapeutic INR; and AKI.  Plavix but was stopped but she was restarted on ASA and Coumadin.  She was oriented to self only due to her dementia and recommended for SNF placement and eventually was discharged to [facility].  Dr. Debara Pickett saw her in f/u on 1/26 and she was recommended to stay off ASA and Plavix indefinitely but was to remain on Coumadin.  By her f/u appointment with Dr. Dennard Schaumann on 2/15, she had returned to home.  She came home from SNF on 2/6, has been living with her daughter for the last 4 weeks.  She was in SNF rehab and had PT/OT after d/c.  She couldn't do anything at all by herself but she had more strength after d/c.  Her daughter has no help and her dementia was worse and couldn't stay home alone but she would have to bring her with her.  She could get her down the stairs but not back up the stairs.  Even though she could walk and maneuver, each week it got less.  She was struggling to get out of a chair.  The last 3-4 days, she seemed to be getting worse and couldn't walk across the floor.  She fell Saturday evening - sitting in her chair and complained and her bottom hurting.  Her daughter got her up to walk with her walker.  She was able to walk to the bedroom and started to back up. Her daughter was worried about her falling but she got back to the  chair.  This happened again a second time but she would not pick up her feet and she gracefully fell to the ground, did not get hurt.  She called her son to pick her up, and she was clearly weaker than on prior falls, "this time she was limp as a dishrag."  She just couldn't get her up anymore and so brought her here.  She was eating and drinking well after first arriving back home.  Prior to last admission, the only thing she would eat was a [employer] chicken sandwich.  She is unable to wipe herself at baseline.  She was not eating the last few days - just a few bites at a time.  Her daughter prefers long-term placement; her brother refused last time.  Her daughter does not want her at home, even with hospice.   ED Course:  Seen last night - falls, failing to live at home independently.  Neuro consulted.  Given Ativan for MRI/MRA (negative), excessively somnolent after.  Afib, RVR; currently agitated and confused.  Review of Systems: unable to perform  Ambulatory Status:  Ambulates with a walker  Past Medical History:  Diagnosis Date  . Atrial fibrillation, persistent (HCC)    Atrial fibrillation  . CAD (coronary artery disease)    a. 01/2015 NSTEMI/PCI: LM nl, LAD 30p, LCX nondom, 30-40p, 50d, OM1 100p (2.75x16 Rebel  BMS), RCA mild diff plaque, EF 55%.  . Cancer ([place])   . Candida infection, disseminated ([place])   . Dementia ([place])   . Hyperlipidemia   . Hypertension   . Hypothyroid   . Osteoporosis   . S/P TAVR (transcatheter aortic valve replacement)    s/p TAVR with 27mm Edwards S3U THV via the TF approach  . Severe aortic stenosis   . Sleep apnea 04/2012   Mild/ AHI 10/CPAP 7cm h2o w/2 L o2    Past Surgical History:  Procedure Laterality Date  . CARDIAC CATHETERIZATION  11/10/2019   RIGHT/LEFT HEART CATH AND CORONARY ANGIOGRAPHY   . CARDIOVERSION N/A 08/22/2015   Procedure: CARDIOVERSION;  Surgeon: Skeet Latch, MD;  Location: [place];  Service: Cardiovascular;   Laterality: N/A;  . JOINT REPLACEMENT Left    knee  . KNEE SURGERY    . LEFT HEART CATHETERIZATION WITH CORONARY ANGIOGRAM N/A 02/20/2015   Procedure: LEFT HEART CATHETERIZATION WITH CORONARY ANGIOGRAM;  Surgeon: Jolaine Artist, MD;  Location: [facility] CATH LAB;  Service: Cardiovascular;  Laterality: N/A;  . PERCUTANEOUS CORONARY STENT INTERVENTION (PCI-S)  02/20/2015   Procedure: PERCUTANEOUS CORONARY STENT INTERVENTION (PCI-S);  Surgeon: Jolaine Artist, MD;  Location: [facility] C[facility] LAB;  Service: Cardiovascular;;  OM1  (2.75/32mm Rebel)  . RIGHT/LEFT HEART CATH AND CORONARY ANGIOGRAPHY N/A 11/10/2019   Procedure: RIGHT/LEFT HEART CATH AND CORONARY ANGIOGRAPHY;  Surgeon: Burnell Blanks, MD;  Location: [place] CV LAB;  Service: Cardiovascular;  Laterality: N/A;  . TEE WITHOUT CARDIOVERSION N/A 11/30/2019   Procedure: TRANSESOPHAGEAL ECHOCARDIOGRAM (TEE);  Surgeon: Burnell Blanks, MD;  Location: [place];  Service: Open Heart Surgery;  Laterality: N/A;  . TONSILLECTOMY    . TRANSCATHETER AORTIC VALVE REPLACEMENT, TRANSFEMORAL N/A 11/30/2019   Procedure: TRANSCATHETER AORTIC VALVE REPLACEMENT, TRANSFEMORAL;  Surgeon: Burnell Blanks, MD;  Location: [place];  Service: Open Heart Surgery;  Laterality: N/A;    Social History   Socioeconomic History  . Marital status: Widowed    Spouse name: Not on file  . Number of children: Not on file  . Years of education: Not on file  . Highest education level: Not on file  Occupational History  . Not on file  Tobacco Use  . Smoking status: Former Smoker    Quit date: 04/28/1995    Years since quitting: 24.8  . Smokeless tobacco: Never Used  Substance and Sexual Activity  . Alcohol use: No    Alcohol/week: 0.0 standard drinks  . Drug use: No  . Sexual activity: Not on file  Other Topics Concern  . Not on file  Social History Narrative  . Not on file   Social Determinants of Health   Financial Resource Strain:   . Difficulty  of Paying Living Expenses: Not on file  Food Insecurity:   . Worried About Charity fundraiser in the Last Year: Not on file  . Ran Out of Food in the Last Year: Not on file  Transportation Needs:   . Lack of Transportation (Medical): Not on file  . Lack of Transportation (Non-Medical): Not on file  Physical Activity:   . Days of Exercise per Week: Not on file  . Minutes of Exercise per Session: Not on file  Stress:   . Feeling of Stress : Not on file  Social Connections:   . Frequency of Communication with Friends and Family: Not on file  . Frequency of Social Gatherings with Friends and Family: Not on file  .  Attends Religious Services: Not on file  . Active Member of Clubs or Organizations: Not on file  . Attends Archivist Meetings: Not on file  . Marital Status: Not on file  Intimate Partner Violence:   . Fear of Current or Ex-Partner: Not on file  . Emotionally Abused: Not on file  . Physically Abused: Not on file  . Sexually Abused: Not on file    Allergies  Allergen Reactions  . Fosamax [Alendronate Sodium] Other (See Comments)    Unknown per daughter    Family History  Problem Relation Age of Onset  . Coronary artery disease Father   . Diabetes Father   . Stroke Mother   . Arthritis Mother     Prior to Admission medications   Medication Sig Start Date End Date Taking? Authorizing Provider  acetaminophen (TYLENOL) 500 MG tablet Take 1,000 mg by mouth every 6 (six) hours as needed for mild pain or headache.   Yes [provider]  ALPRAZolam (XANAX) 0.5 MG tablet Take 1 tablet (0.5 mg total) by mouth at bedtime as needed for sleep. 12/13/19  Yes [name], MD  atorvastatin (LIPITOR) 80 MG tablet Take 80 mg by mouth daily.   Yes [provider]  cholecalciferol (VITAMIN D) 1000 UNITS tablet Take 1,000 Units by mouth daily.   Yes [provider]  cyanocobalamin 1000 MCG tablet Take 1,000 mcg by mouth daily.    Yes  [provider]  furosemide (LASIX) 20 MG tablet Take 1 tablet (20 mg total) by mouth daily. 02/29/20  Yes [name], MD  levothyroxine (SYNTHROID) 137 MCG tablet Take 137 mcg by mouth daily before breakfast.   Yes [provider]  metoprolol tartrate (LOPRESSOR) 50 MG tablet Take 50 mg by mouth 2 (two) times daily.   Yes [provider]  Multiple Vitamins-Minerals (MULTIVITAMIN WITH MINERALS) tablet Take 1 tablet by mouth 2 (two) times daily.   Yes [provider]  pantoprazole (PROTONIX) 40 MG tablet Take 1 tablet (40 mg total) by mouth daily. 01/15/20  Yes Adhikari, Tamsen Meek, MD  potassium chloride (KLOR-CON) 10 MEQ tablet Take 1 tablet (10 mEq total) by mouth daily. 02/29/20  Yes Pickard, Cammie Mcgee, MD  RISPERDAL 0.5 MG tablet TAKE 1 TABLET BY MOUTH TWICE A DAY Patient taking differently: Take 0.5 mg by mouth 2 (two) times daily.  03/02/20  Yes [name], MD  traMADol (ULTRAM) 50 MG tablet Take 1 tablet (50 mg total) by mouth every 8 (eight) hours as needed. 11/02/18  Yes [name], MD  warfarin (COUMADIN) 1 MG tablet Take 1-2 mg by mouth See admin instructions. Take 1mg  on Tuesday and all other days take  2 tablets = 2mg  daily.   Yes [provider]    Physical Exam: Vitals:   03/06/20 1100 03/06/20 1200 03/06/20 1208 03/06/20 1248  BP: 103/62 (!) 119/91    Pulse: (!) 105 (!) 138  (!) 114  Resp: (!) 22 (!) 26  (!) 34  Temp:   97.6 F (36.4 C)   TempSrc:   Rectal   SpO2: 95% 97%  96%     . General:  Appears somnolent, does not open eyes but is oriented x 1; closed lips around the straw but did not attempt to drink despite encouragement . Eyes:  PERRL, EOMI, normal lids, iris . ENT:  grossly normal hearing, lips & tongue, mmm; artificial dentition . Neck:  no LAD, masses or thyromegaly .  Cardiovascular:  Irregularly irregular with mild tachcyardia, no m/r/g. No LE edema.  Marland Kitchen Respiratory:   CTA bilaterally with no  wheezes/rales/rhonchi.  Mildly increased respiratory effort. . Abdomen:  soft, NT, ND, NABS . Skin:  no rash or induration seen on limited exam . Musculoskeletal:  Mildly decreased tone BUE/BLE, no bony abnormality . Psychiatric:  flat mood and affect, speech mumbled, AOx1 . Neurologic:  Unable to perform    Radiological Exams on Admission: CT Angio Head W or Wo Contrast  Result Date: 03/05/2020 CLINICAL DATA:  Dizziness, rule out dissection; cerebral hemorrhage suspected. Additional history provided: Weakness and numerous falls for 1 week. EXAM: CT ANGIOGRAPHY HEAD AND NECK TECHNIQUE: Multidetector CT imaging of the head and neck was performed using the standard protocol during bolus administration of intravenous contrast. Multiplanar CT image reconstructions and MIPs were obtained to evaluate the vascular anatomy. Carotid stenosis measurements (when applicable) are obtained utilizing NASCET criteria, using the distal internal carotid diameter as the denominator. CONTRAST:  91mL OMNIPAQUE IOHEXOL 350 MG/ML SOLN COMPARISON:  CT head 07/10/2011 FINDINGS: CT HEAD FINDINGS Brain: There is no evidence of acute intracranial hemorrhage, intracranial mass, midline shift or extra-axial fluid collection.No demarcated cortical infarction. Moderate ill-defined hypoattenuation within the cerebral white matter is nonspecific, but consistent with chronic small vessel ischemic disease. Stable, moderate generalized parenchymal atrophy. Vascular: Reported separately. Skull: Normal. Negative for fracture or focal lesion. Sinuses: No significant paranasal sinus disease or mastoid effusion at the imaged levels. Orbits: Visualized orbits demonstrate no acute abnormality. Review of the MIP images confirms the above findings CTA NECK FINDINGS Aortic arch: Please note the origin of the innominate and left common carotid arteries are not included in the field of view. Mixed plaque within the visualized aortic arch and proximal  major branch vessels of the neck. No significant innominate or proximal subclavian artery stenosis within described limitations. Right carotid system: CCA and ICA patent within the neck without stenosis. Mild calcified plaque within the proximal external carotid artery. Left carotid system: The origin of the left common carotid artery is excluded from the field of view. Within this limitation, the CCA and ICA patent within the neck without stenosis. Vertebral arteries: Codominant and patent within the neck. Mild calcified plaque at the vertebral artery origins bilaterally without significant stenosis. Skeleton: No acute bony abnormality or aggressive osseous lesion. Cervical spondylosis without high-grade bony spinal canal stenosis. Partially visualized thoracic dextrocurvature. Other neck: No soft tissue neck mass or cervical lymphadenopathy. Diminutive thyroid gland with associated calcifications. Upper chest: No consolidation within the imaged lung apices. Mild patchy ground-glass opacity within the imaged right lung apex is nonspecific but may reflect minimal edema. Review of the MIP images confirms the above findings CTA HEAD FINDINGS Anterior circulation: The intracranial internal carotid arteries are patent with scattered calcified plaque. Mild stenosis within the cavernous right ICA. Up to moderate stenosis within the paraclinoid right ICA. No more than mild stenosis within the intracranial left ICA. The M1 middle cerebral arteries are patent without significant stenosis. No M2 proximal branch occlusion or high-grade proximal stenosis is identified. The anterior cerebral arteries are patent without high-grade proximal stenosis. 1-2 mm tiny aneurysm versus infundibulum projecting inferiorly from the supraclinoid right ICA (series 13, image 87). Posterior circulation: The intracranial vertebral arteries are patent without significant stenosis, as is the basilar artery. Minimal calcified plaque within the  proximal basilar artery. The bilateral posterior cerebral arteries are patent without significant proximal stenosis. Venous sinuses: Within limitations of contrast timing, no convincing  thrombus. Anatomic variants: Posterior communicating arteries are poorly delineated and may be hypoplastic or absent bilaterally. Review of the MIP images confirms the above findings IMPRESSION: CT head: 1. No evidence of acute intracranial abnormality. 2. Stable, moderate generalized parenchymal atrophy and chronic small vessel ischemic disease. CTA neck: 1. The origin of the innominate artery is excluded from the field of view. 2. The origin of the left common carotid arteries excluded from the field of view. Within this limitation, the bilateral common carotid, internal carotid and vertebral arteries are patent within the neck without significant stenosis. Mild atherosclerotic disease within these vessels as described. No evidence of dissection. 3. Patchy ground-glass opacity within the imaged right lung apex. Findings are nonspecific but may reflect minimal edema. Correlate clinically for infection. CTA head: 1. No intracranial large vessel occlusion or proximal high-grade arterial stenosis. 2. Calcified plaque within the intracranial internal carotid arteries. Within the right ICA, there is mild stenosis within the cavernous segment and up to moderate stenosis within the paraclinoid segment. No more than mild stenosis within the intracranial left ICA. 3. 1-2 mm tiny aneurysm versus infundibulum arising from the supraclinoid right ICA. Electronically Signed   By: Kellie Simmering DO   On: 03/05/2020 20:04   CT Angio Neck W and/or Wo Contrast  Result Date: 03/05/2020 CLINICAL DATA:  Dizziness, rule out dissection; cerebral hemorrhage suspected. Additional history provided: Weakness and numerous falls for 1 week. EXAM: CT ANGIOGRAPHY HEAD AND NECK TECHNIQUE: Multidetector CT imaging of the head and neck was performed using the  standard protocol during bolus administration of intravenous contrast. Multiplanar CT image reconstructions and MIPs were obtained to evaluate the vascular anatomy. Carotid stenosis measurements (when applicable) are obtained utilizing NASCET criteria, using the distal internal carotid diameter as the denominator. CONTRAST:  62mL OMNIPAQUE IOHEXOL 350 MG/ML SOLN COMPARISON:  CT head 07/10/2011 FINDINGS: CT HEAD FINDINGS Brain: There is no evidence of acute intracranial hemorrhage, intracranial mass, midline shift or extra-axial fluid collection.No demarcated cortical infarction. Moderate ill-defined hypoattenuation within the cerebral white matter is nonspecific, but consistent with chronic small vessel ischemic disease. Stable, moderate generalized parenchymal atrophy. Vascular: Reported separately. Skull: Normal. Negative for fracture or focal lesion. Sinuses: No significant paranasal sinus disease or mastoid effusion at the imaged levels. Orbits: Visualized orbits demonstrate no acute abnormality. Review of the MIP images confirms the above findings CTA NECK FINDINGS Aortic arch: Please note the origin of the innominate and left common carotid arteries are not included in the field of view. Mixed plaque within the visualized aortic arch and proximal major branch vessels of the neck. No significant innominate or proximal subclavian artery stenosis within described limitations. Right carotid system: CCA and ICA patent within the neck without stenosis. Mild calcified plaque within the proximal external carotid artery. Left carotid system: The origin of the left common carotid artery is excluded from the field of view. Within this limitation, the CCA and ICA patent within the neck without stenosis. Vertebral arteries: Codominant and patent within the neck. Mild calcified plaque at the vertebral artery origins bilaterally without significant stenosis. Skeleton: No acute bony abnormality or aggressive osseous lesion.  Cervical spondylosis without high-grade bony spinal canal stenosis. Partially visualized thoracic dextrocurvature. Other neck: No soft tissue neck mass or cervical lymphadenopathy. Diminutive thyroid gland with associated calcifications. Upper chest: No consolidation within the imaged lung apices. Mild patchy ground-glass opacity within the imaged right lung apex is nonspecific but may reflect minimal edema. Review of the MIP images confirms the above  findings CTA HEAD FINDINGS Anterior circulation: The intracranial internal carotid arteries are patent with scattered calcified plaque. Mild stenosis within the cavernous right ICA. Up to moderate stenosis within the paraclinoid right ICA. No more than mild stenosis within the intracranial left ICA. The M1 middle cerebral arteries are patent without significant stenosis. No M2 proximal branch occlusion or high-grade proximal stenosis is identified. The anterior cerebral arteries are patent without high-grade proximal stenosis. 1-2 mm tiny aneurysm versus infundibulum projecting inferiorly from the supraclinoid right ICA (series 13, image 87). Posterior circulation: The intracranial vertebral arteries are patent without significant stenosis, as is the basilar artery. Minimal calcified plaque within the proximal basilar artery. The bilateral posterior cerebral arteries are patent without significant proximal stenosis. Venous sinuses: Within limitations of contrast timing, no convincing thrombus. Anatomic variants: Posterior communicating arteries are poorly delineated and may be hypoplastic or absent bilaterally. Review of the MIP images confirms the above findings IMPRESSION: CT head: 1. No evidence of acute intracranial abnormality. 2. Stable, moderate generalized parenchymal atrophy and chronic small vessel ischemic disease. CTA neck: 1. The origin of the innominate artery is excluded from the field of view. 2. The origin of the left common carotid arteries excluded  from the field of view. Within this limitation, the bilateral common carotid, internal carotid and vertebral arteries are patent within the neck without significant stenosis. Mild atherosclerotic disease within these vessels as described. No evidence of dissection. 3. Patchy ground-glass opacity within the imaged right lung apex. Findings are nonspecific but may reflect minimal edema. Correlate clinically for infection. CTA head: 1. No intracranial large vessel occlusion or proximal high-grade arterial stenosis. 2. Calcified plaque within the intracranial internal carotid arteries. Within the right ICA, there is mild stenosis within the cavernous segment and up to moderate stenosis within the paraclinoid segment. No more than mild stenosis within the intracranial left ICA. 3. 1-2 mm tiny aneurysm versus infundibulum arising from the supraclinoid right ICA. Electronically Signed   By: Kellie Simmering DO   On: 03/05/2020 20:04   MR BRAIN WO CONTRAST  Result Date: 03/05/2020 CLINICAL DATA:  Initial evaluation for focal neural deficit, stroke suspected. EXAM: MRI HEAD WITHOUT CONTRAST TECHNIQUE: Multiplanar, multiecho pulse sequences of the brain and surrounding structures were obtained without intravenous contrast. COMPARISON:  Prior CTA from earlier the same day. FINDINGS: Brain: Examination mildly degraded by motion artifact. Diffuse prominence of the CSF containing spaces compatible generalized age-related cerebral atrophy. Patchy and confluent T2/FLAIR hyperintensity within the periventricular deep white matter both cerebral hemispheres most consistent with chronic small vessel ischemic disease, moderate in nature. Patchy involvement of the pons noted. No abnormal foci of restricted diffusion to suggest acute or subacute ischemia. Gray-white matter differentiation maintained. No encephalomalacia to suggest chronic cortical infarction. No foci of susceptibility artifact to suggest acute or chronic intracranial  hemorrhage. No mass lesion, midline shift or mass effect. No hydrocephalus. No extra-axial fluid collection. Pituitary gland and suprasellar region within normal limits. Midline structures intact. Vascular: Major intracranial vascular flow voids are maintained. Skull and upper cervical spine: Craniocervical junction normal. Upper cervical spine within normal limits. Bone marrow signal intensity normal. No scalp soft tissue abnormality. Sinuses/Orbits: Patient status post bilateral ocular lens replacement. Paranasal sinuses are clear. Trace bilateral mastoid effusions, of doubtful significance. Small amount of fluid noted layering within the nasopharynx. Other: None. IMPRESSION: 1. No acute intracranial infarct or other abnormality. 2. Age-related cerebral atrophy with moderate chronic microvascular ischemic disease. Electronically Signed   By: Jeannine Boga  M.D.   On: 03/05/2020 21:45   DG Chest Port 1 View  Result Date: 03/05/2020 CLINICAL DATA:  Shortness of breath, weakness and numerous falls EXAM: PORTABLE CHEST 1 VIEW COMPARISON:  Radiograph 11/30/2019 FINDINGS: Aortic valve stent graft repair is in stable position from comparison radiograph. Stable mild cardiomegaly with a calcified aorta. No consolidation, features of edema, pneumothorax, or effusion. No acute osseous or soft tissue abnormality. No acute osseous or soft tissue abnormality. Degenerative changes are present in the imaged spine and shoulders. The osseous structures appear diffusely demineralized which may limit detection of small or nondisplaced fractures. IMPRESSION: No acute cardiopulmonary abnormality. Stable mild cardiomegaly. Stable positioning of a transcatheter aortic valve repair. Electronically Signed   By: Lovena Le M.D.   On: 03/05/2020 18:57    EKG: Afib, Rate 92; NSCSLT   Labs on Admission: I have personally reviewed the available labs and imaging studies at the time of the admission.  Pertinent labs:   BUN  18/Creatinine 1.16/GFR 43; 13/0.94/56 on 2/15 Normal CBC INR 2.3, 2.6 UA: trace LE TSH 2.832 ABG 7.449/37.6/66.0    Assessment/Plan Principal Problem:   Advanced dementia (HCC) Active Problems:   Hyperlipidemia   Essential hypertension   Sleep apnea   Persistent atrial fibrillation (HCC)   Hypothyroidism   S/P TAVR (transcatheter aortic valve replacement)   Stage 3a chronic kidney disease   Advanced dementia -She was noted to be oriented only to self during prior hospitalization -Family was reluctant to d/c to SNF but did go for about a week -Since d/c from SNF, she has shown continued decline -Over the last few days, she is barely eating/drinking and is having increased falls -Evaluation in the ER is unremarkable other than advanced dementia with very poor cognition; while this may have been exacerbated by Ativan if appears that this is not significantly different from her known baseline -This appears to be related to severe debility and/or terminal dementia -I attempted to discuss this issue with her daughter, who is quite overwhelmed -The patient can clearly not return home -Options include residential hospice and SNF with hospice -I spoke with Farrel Gordon from Hospice, who will speak with the daughter more -I also enlisted support from Case Manager Wood in the ER to help facilitate placement -Will admit for ongoing evaluation and coordination of placement -Continue Risperdal, Xanax if needed and patient is able to take PO  Afib, s/p TAVR -She has known afib and developed mild RVR while in the ER; this was likely due to not being given rate controlling medication and has improved with meds -She is not currently able to effectively take PO medications and so was given IV Lopressor x 1 with improvement; can transition to ongoing IV Lopressor if needed -Continue Coumadin if able to take PO  HTN -Continue Lopressor if able to take PO  HLD -Continue Lipitor if able  to take PO  OSA -Continue CPAP  Stage 3a CKD -Appears to be stable at this time despite report of patient not taking significant PO -Will give gentle IV hydration  Hypothyroidism -Continue Synthroid at current dose for now if able to take PO      Note: This patient has been tested and is negative for the novel coronavirus COVID-19.  DVT prophylaxis: Coumadin Code Status:  DNR - confirmed with family Family Communication: None present; I spoke with the patient's daughter at length by telephone at the time of admission. Disposition Plan: She is anticipated to d/c with hospices services -  either to SNF or to residential hospice Consults called: Hospice; TOC team; PT Admission status: Admit - It is my clinical opinion that admission to INPATIENT is reasonable and necessary because of the expectation that this patient will require hospital care that crosses at least 2 midnights to treat this condition based on the medical complexity of the problems presented.  Given the aforementioned information, the predictability of an adverse outcome is felt to be significant.    Karmen Bongo MD [facility]   How to contact the [facility] Attending or Consulting provider [place] or covering provider during after hours [place], for this patient?  1. Check the care team in [facility] and look for a) attending/consulting TRH provider listed and b) the [facility] team listed 2. Log into [email or web address] and use [place]'s universal password to access. If you do not have the password, please contact the hospital operator. 3. Locate the [facility] provider you are looking for under [facility] and page to a number that you can be directly reached. 4. If you still have difficulty reaching the provider, please page the [facility] (Director on Call) for the Hospitalists listed on amion for assistance.   03/06/2020, 2:13 PM

## 2020-03-06 NOTE — Progress Notes (Signed)
Patient ordered CPAP QHS but at this time not deemed safe to use, with patient still having issues of alertness and awareness. Contraindicated use for now, discussed with RN and we will continue to evaluate and use when more alert and able to remove mask on own when needed.

## 2020-03-06 NOTE — ED Provider Notes (Signed)
86 yo female presented last night from home with increasing weakness and multiple falls.  Patient seen by neuro and had mri/mra which required sedation.  Patient without acute changes and neuro advised admission for further work up. Review reveals patient was too weak to have orhtostatics done.Lab work up otherwise normal.  Ammonia and tsh checked per neuro suggestions. Vitals:   03/06/20 0830 03/06/20 0845  BP: 117/60 113/67  Pulse: (!) 122 (!) 124  Resp:  (!) 28  Temp:    SpO2: 95% 94%   Patient tachycardiac A fib rvr on ekg Patient confused and unable to give history to me Reviewed chart and neuro unable to complete exam secondary to medication effects. Patient continues confused and tachycardiac today. Plan admission for further evaluation and treatment      Pattricia Boss, MD 03/06/20 1905

## 2020-03-06 NOTE — Social Work (Signed)
CSW acknowledging consult for SNF placement. At this time also appears that referral was called for pt for [facility]. TOC team will f/u in morning for appropriate disposition. It appears family would like to discuss pt medical status etc with MD team.    Westley Hummer, MSW, [place] Work

## 2020-03-06 NOTE — Plan of Care (Signed)
  Problem: Education: Goal: Knowledge of General Education information will improve Description: Including pain rating scale, medication(s)/side effects and non-pharmacologic comfort measures Outcome: Not Progressing   

## 2020-03-06 NOTE — Discharge Planning (Signed)
RNCM consulted regarding residential hospice care.

## 2020-03-06 NOTE — Progress Notes (Signed)
Engineer, maintenance [facility]) Hospital Liaison note.    Received request from Dr. Lorin Mercy for family interest in [facility]. Spoke with daughter to explain services and offer support. Daughter is not sure her mother ready for [facility] and wants more discussion with physician.   Patient information forwarded to [facility] place for review. [facility] does not have a bed to offer today. Hospital liaison will follow  up with family and [facility] staff tomorrow.   Thank you,    Farrel Gordon, RN, CCM      [place] (listed on AMION under Hospice and [facility] [place])    [phone]

## 2020-03-07 DIAGNOSIS — F0390 Unspecified dementia without behavioral disturbance: Secondary | ICD-10-CM

## 2020-03-07 DIAGNOSIS — I10 Essential (primary) hypertension: Secondary | ICD-10-CM

## 2020-03-07 LAB — CBC
HCT: 38.4 % (ref 36.0–46.0)
Hemoglobin: 12 g/dL (ref 12.0–15.0)
MCH: 28.3 pg (ref 26.0–34.0)
MCHC: 31.3 g/dL (ref 30.0–36.0)
MCV: 90.6 fL (ref 80.0–100.0)
Platelets: 161 10*3/uL (ref 150–400)
RBC: 4.24 MIL/uL (ref 3.87–5.11)
RDW: 14.4 % (ref 11.5–15.5)
WBC: 10.2 10*3/uL (ref 4.0–10.5)
nRBC: 0 % (ref 0.0–0.2)

## 2020-03-07 LAB — BASIC METABOLIC PANEL
Anion gap: 11 (ref 5–15)
BUN: 19 mg/dL (ref 8–23)
CO2: 25 mmol/L (ref 22–32)
Calcium: 8.2 mg/dL — ABNORMAL LOW (ref 8.9–10.3)
Chloride: 105 mmol/L (ref 98–111)
Creatinine, Ser: 1.04 mg/dL — ABNORMAL HIGH (ref 0.44–1.00)
GFR calc Af Amer: 57 mL/min — ABNORMAL LOW (ref >60)
GFR calc non Af Amer: 49 mL/min — ABNORMAL LOW (ref >60)
Glucose, Bld: 96 mg/dL (ref 70–99)
Potassium: 4.5 mmol/L (ref 3.5–5.1)
Sodium: 141 mmol/L (ref 135–145)

## 2020-03-07 LAB — PROTIME-INR
INR: 3.4 — ABNORMAL HIGH (ref 0.8–1.2)
Prothrombin Time: 34.1 seconds — ABNORMAL HIGH (ref 11.4–15.2)

## 2020-03-07 LAB — URINE CULTURE: Culture: NO GROWTH

## 2020-03-07 MED ORDER — WARFARIN - PHARMACIST DOSING INPATIENT: Freq: Every day | Status: DC

## 2020-03-07 NOTE — Progress Notes (Signed)
Nutrition Brief Note  Chart reviewed. Pt family considering SNF with hospice vs possible residential hospice. Pt too obtunded to eat or drink at this time.  No further nutrition interventions warranted at this time.  Please re-consult as needed.   Loistine Chance, RD, LDN, [place] Registered Dietitian II Certified Diabetes Care and Education Specialist Please refer to [facility] for RD and/or RD on-call/weekend/after hours pager

## 2020-03-07 NOTE — Progress Notes (Signed)
ANTICOAGULATION CONSULT NOTE - Initial Consult  Pharmacy Consult for Warfarin Indication:  On chronic warfarin for h/o Afib  Allergies  Allergen Reactions  . Fosamax [Alendronate Sodium] Other (See Comments)    Unknown per daughter    Patient Measurements: Height: 4\' 9"  (144.8 cm) Weight: 178 lb 12.7 oz (81.1 kg) IBW/kg (Calculated) : 38.6   Vital Signs: Temp: 98.6 F (37 C) (03/09 1434) Temp Source: Axillary (03/09 1434) BP: 135/57 (03/09 1434) Pulse Rate: 99 (03/09 1434)  Labs: Recent Labs    03/05/20 1757 03/05/20 1757 03/05/20 1902 03/06/20 0023 03/06/20 0409 03/07/20 0237  HGB 12.1   < >  --  10.9*  --  12.0  HCT 40.1  --   --  32.0*  --  38.4  PLT 182  --   --   --   --  161  LABPROT  --   --  24.9*  --  28.0* 34.1*  INR  --   --  2.3*  --  2.6* 3.4*  CREATININE 1.16*  --   --   --   --  1.04*   < > = values in this interval not displayed.    Estimated Creatinine Clearance: 34.7 mL/min (A) (by C-G formula based on SCr of 1.04 mg/dL (H)).   Medical History: Past Medical History:  Diagnosis Date  . Atrial fibrillation, persistent (HCC)    Atrial fibrillation  . CAD (coronary artery disease)    a. 01/2015 NSTEMI/PCI: LM nl, LAD 30p, LCX nondom, 30-40p, 50d, OM1 100p (2.75x16 Rebel BMS), RCA mild diff plaque, EF 55%.  . Cancer ([place])   . Candida infection, disseminated ([place])   . Dementia ([place])   . Hyperlipidemia   . Hypertension   . Hypothyroid   . Osteoporosis   . S/P TAVR (transcatheter aortic valve replacement)    s/p TAVR with 7mm Edwards S3U THV via the TF approach  . Severe aortic stenosis   . Sleep apnea 04/2012   Mild/ AHI 10/CPAP 7cm h2o w/2 L o2    Medications:  Facility-Administered Medications Prior to Admission  Medication Dose Route Frequency Provider Last Rate Last Admin  . denosumab (PROLIA) injection 60 mg  60 mg Subcutaneous Q6 months [name], MD   60 mg at 11/29/19 [id number]   Medications Prior to Admission  Medication  Sig Dispense Refill Last Dose  . acetaminophen (TYLENOL) 500 MG tablet Take 1,000 mg by mouth every 6 (six) hours as needed for mild pain or headache.   unk  . ALPRAZolam (XANAX) 0.5 MG tablet Take 1 tablet (0.5 mg total) by mouth at bedtime as needed for sleep. 30 tablet 0 Past Week at Unknown time  . atorvastatin (LIPITOR) 80 MG tablet Take 80 mg by mouth daily.   03/04/2020  . cholecalciferol (VITAMIN D) 1000 UNITS tablet Take 1,000 Units by mouth daily.   03/05/2020 at Unknown time  . cyanocobalamin 1000 MCG tablet Take 1,000 mcg by mouth daily.    03/05/2020 at Unknown time  . furosemide (LASIX) 20 MG tablet Take 1 tablet (20 mg total) by mouth daily. 90 tablet 3 03/01/2020  . levothyroxine (SYNTHROID) 137 MCG tablet Take 137 mcg by mouth daily before breakfast.   03/05/2020 at Unknown time  . metoprolol tartrate (LOPRESSOR) 50 MG tablet Take 50 mg by mouth 2 (two) times daily.   03/05/2020 at 01000  . Multiple Vitamins-Minerals (MULTIVITAMIN WITH MINERALS) tablet Take 1 tablet by mouth 2 (two) times daily.  03/05/2020 at Unknown time  . pantoprazole (PROTONIX) 40 MG tablet Take 1 tablet (40 mg total) by mouth daily. 30 tablet 1 03/05/2020 at Unknown time  . potassium chloride (KLOR-CON) 10 MEQ tablet Take 1 tablet (10 mEq total) by mouth daily. 90 tablet 3 03/01/2020  . RISPERDAL 0.5 MG tablet TAKE 1 TABLET BY MOUTH TWICE A DAY (Patient taking differently: Take 0.5 mg by mouth 2 (two) times daily. ) 180 tablet 1 03/05/2020 at Unknown time  . traMADol (ULTRAM) 50 MG tablet Take 1 tablet (50 mg total) by mouth every 8 (eight) hours as needed. 30 tablet 0 unk  . warfarin (COUMADIN) 1 MG tablet Take 1-2 mg by mouth See admin instructions. Take 1mg  on Tuesday and all other days take  2 tablets = 2mg  daily.   03/04/2020 at 1800    Assessment: 86 y.o femalewith advanced dementia who presented on 03/05/20 with generalized weakness from home  on chronic warfarin PTA for history of Afib.  INRon admit was 2.3 on 03/05/20. Today  INR has increased to 3.4 , supratherapeutic.  Warfarin dose not given on 3/8 as patient unable to take p.o. medications.  PTA dosing: 2mg  daily except Tue take 1mg , last taken on 3/6. No meal intake per RN.  Goal of Therapy:  INR 2-3 Monitor platelets by anticoagulation protocol: Yes   Plan:  Hold warfarin today Daily INR Monitor for bleeding  Thank you for allowing pharmacy to be part of this patients care team.  Nicole Cella, RPh Clinical Pharmacist  Please check AMION for all [place] phone numbers After 10:00 PM, call [place] [phone] 03/07/2020,5:00 PM

## 2020-03-07 NOTE — Progress Notes (Signed)
PROGRESS NOTE    [name]  [name] DOB: 1933/09/29 DOA: 03/05/2020 PCP: [name], MD   Brief Narrative:  Patient is 86 year old female with history of permanent A. fib on Coumadin, coronary artery disease on Plavix, dementia, hypertension, hyperlipidemia, aortic stenosis, history of TAVR, OSA on CPAP, hypothyroidism, who presented with generalized weakness from home.  On presentation, she was somnolent, oriented to person only. She was discharged from here to skilled nursing facility on 01/18/2020 after being managed for sepsis secondary to UTI, AKI, upper GI bleed.  Currently she has been living with her daughter for last 4 weeks.  Patient was noted to be very weak at home.  Also fell at home.  Daughter requesting for long-term placement . Neurology was also consulted on presentation.  Brain imagings was negative for stroke. After discussion with the daughter, palliative care also requested.  Given her advanced dementia, multiple comorbidities, poor oral intake,frequent admissions, she is a candidate for residential hospice.  Assessment & Plan:   Principal Problem:   Advanced dementia ([place]) Active Problems:   Hyperlipidemia   Essential hypertension   Sleep apnea   Persistent atrial fibrillation (HCC)   Hypothyroidism   S/P TAVR (transcatheter aortic valve replacement)   Stage 3a chronic kidney disease    Deconditioning/debility/advanced dementia/generalized weakness: Continued decline.  Not eating or drinking at home.  Daughter was requesting for long-term placement but given her advanced dementia, it would be challenging.   Daughter interested on discussion about hospice options. Palliative care  consulted  Permanent A. fib: Was on Coumadin for anticoagulation.  Currently not able to take any p.o. medications.  Follows with cardiology as an outpatient.  She was in mild RVR on presentation.  Continue Lopressor .  Coronary artery disease: On Coumadin.  Currently  not on aspirin or Plavix.  Follows with cardiology Dr. Debara Pickett  Hyperlipidemia:She was On Lipitor.  Hypothyroidism: She was on Levothyroxine  History of severe aortic stenosis: Status post TAVR.  Advanced dementia:Oriented to self only. Confused at baseline.   Hypertension:. Was on metoprolol at home.  Currently blood stable.  OSA: On CPAP at baseline.  Stage IIIa CKD: Appears stable at this time.  On gentle hydration.          DVT prophylaxis:Coumadin Code Status: DNR Family Communication:  Disposition Plan: Patient is from home.  Patient unable to go back to home due to increased level of care.  Residential hospice versus skilled nursing facility planned.  Palliative consultation pending.   Consultants: Palliative care  Procedures:None  Antimicrobials:  Anti-infectives (From admission, onward)   None      Subjective:  Patient seen and examined at the bedside this morning.  Hemodynamically stable.  Looked comfortable during my evaluation.  Confused .  Not in any kind of distress  Objective: Vitals:   03/06/20 1700 03/07/20 0027 03/07/20 0029 03/07/20 0436  BP:  (!) 101/50 104/62 (!) 103/56  Pulse:  99 93 (!) 55  Resp:  18  18  Temp:  99.7 F (37.6 C)  99.1 F (37.3 C)  TempSrc:  Axillary  Axillary  SpO2:  97%  98%  Weight: 81.1 kg     Height: 4\' 9"  (1.448 m)       Intake/Output Summary (Last 24 hours) at 03/07/2020 0759 Last data filed at 03/07/2020 0300 Gross per 24 hour  Intake 415.11 ml  Output 350 ml  Net 65.11 ml   Filed Weights   03/06/20 1700  Weight: 81.1 kg  Examination:  General exam: Generalized weakness, deconditioned, debilitated  HEENT:PERRL,Oral mucosa moist, Ear/Nose normal on gross exam Respiratory system: Bilateral equal air entry, normal vesicular breath sounds, no wheezes or crackles  Cardiovascular system: Afib,. No JVD, murmurs, rubs, gallops or clicks. No pedal edema. Gastrointestinal system: Abdomen is  nondistended, soft and nontender. No organomegaly or masses felt. Normal bowel sounds heard. Central nervous system: Alert and awake but not oriented Extremities: No edema, no clubbing ,no cyanosis Skin: Minor skin breakdowns, scattered ecchymosis      Data Reviewed: I have personally reviewed following labs and imaging studies  CBC: Recent Labs  Lab 03/05/20 1757 03/06/20 0023 03/07/20 0237  WBC 8.3  --  10.2  HGB 12.1 10.9* 12.0  HCT 40.1 32.0* 38.4  MCV 94.6  --  90.6  PLT 182  --  [id number]   Basic Metabolic Panel: Recent Labs  Lab 03/05/20 1757 03/06/20 0023 03/07/20 0237  NA 138 139 141  K 4.1 3.8 4.5  CL 104  --  105  CO2 24  --  25  GLUCOSE 105*  --  96  BUN 18  --  19  CREATININE 1.16*  --  1.04*  CALCIUM 8.6*  --  8.2*   GFR: Estimated Creatinine Clearance: 34.7 mL/min (A) (by C-G formula based on SCr of 1.04 mg/dL (H)). Liver Function Tests: No results for input(s): AST, ALT, ALKPHOS, BILITOT, PROT, ALBUMIN in the last 168 hours. No results for input(s): LIPASE, AMYLASE in the last 168 hours. Recent Labs  Lab 03/06/20 0237  AMMONIA 10   Coagulation Profile: Recent Labs  Lab 03/05/20 1902 03/06/20 0409 03/07/20 0237  INR 2.3* 2.6* 3.4*   Cardiac Enzymes: No results for input(s): CKTOTAL, CKMB, CKMBINDEX, TROPONINI in the last 168 hours. BNP (last 3 results) No results for input(s): PROBNP in the last 8760 hours. HbA1C: No results for input(s): HGBA1C in the last 72 hours. CBG: Recent Labs  Lab 03/05/20 1751 03/06/20 0846  GLUCAP 91 97   Lipid Profile: No results for input(s): CHOL, HDL, LDLCALC, TRIG, CHOLHDL, LDLDIRECT in the last 72 hours. Thyroid Function Tests: Recent Labs    03/06/20 0237  TSH 2.832   Anemia Panel: No results for input(s): VITAMINB12, FOLATE, FERRITIN, TIBC, IRON, RETICCTPCT in the last 72 hours. Sepsis Labs: No results for input(s): PROCALCITON, LATICACIDVEN in the last 168 hours.  Recent Results (from the  past 240 hour(s))  SARS CORONAVIRUS 2 (TAT 6-24 HRS) Nasopharyngeal Nasopharyngeal Swab     Status: None   Collection Time: 03/05/20  9:32 PM   Specimen: Nasopharyngeal Swab  Result Value Ref Range Status   SARS Coronavirus 2 NEGATIVE NEGATIVE Final    Comment: (NOTE) SARS-CoV-2 target nucleic acids are NOT DETECTED. The SARS-CoV-2 RNA is generally detectable in upper and lower respiratory specimens during the acute phase of infection. Negative results do not preclude SARS-CoV-2 infection, do not rule out co-infections with other pathogens, and should not be used as the sole basis for treatment or other patient management decisions. Negative results must be combined with clinical observations, patient history, and epidemiological information. The expected result is Negative. Fact Sheet for Patients: SugarRoll.be Fact Sheet for Healthcare Providers: https://www.woods-mathews.com/ This test is not yet approved or cleared by the [place] FDA and  has been authorized for detection and/or diagnosis of SARS-CoV-2 by FDA under an Emergency Use Authorization (EUA). This EUA will remain  in effect (meaning this test can be used) for the duration of the COVID-19 declaration  under Section 56 4(b)(1) of the Act, 21 U.S.C. section 360bbb-3(b)(1), unless the authorization is terminated or revoked sooner. Performed at [facility], [place] [address], [place], [place] [address]   Urine culture     Status: None   Collection Time: 03/05/20  9:50 PM   Specimen: Urine, Random  Result Value Ref Range Status   Specimen Description URINE, RANDOM  Final   Special Requests NONE  Final   Culture   Final    NO GROWTH Performed at [facility], [place] [address] [place], [place] [address]    Report Status 03/07/2020 FINAL  Final         Radiology Studies: CT Angio Head W or Wo Contrast  Result Date: 03/05/2020 CLINICAL DATA:  Dizziness, rule  out dissection; cerebral hemorrhage suspected. Additional history provided: Weakness and numerous falls for 1 week. EXAM: CT ANGIOGRAPHY HEAD AND NECK TECHNIQUE: Multidetector CT imaging of the head and neck was performed using the standard protocol during bolus administration of intravenous contrast. Multiplanar CT image reconstructions and MIPs were obtained to evaluate the vascular anatomy. Carotid stenosis measurements (when applicable) are obtained utilizing NASCET criteria, using the distal internal carotid diameter as the denominator. CONTRAST:  64mL OMNIPAQUE IOHEXOL 350 MG/ML SOLN COMPARISON:  CT head 07/10/2011 FINDINGS: CT HEAD FINDINGS Brain: There is no evidence of acute intracranial hemorrhage, intracranial mass, midline shift or extra-axial fluid collection.No demarcated cortical infarction. Moderate ill-defined hypoattenuation within the cerebral white matter is nonspecific, but consistent with chronic small vessel ischemic disease. Stable, moderate generalized parenchymal atrophy. Vascular: Reported separately. Skull: Normal. Negative for fracture or focal lesion. Sinuses: No significant paranasal sinus disease or mastoid effusion at the imaged levels. Orbits: Visualized orbits demonstrate no acute abnormality. Review of the MIP images confirms the above findings CTA NECK FINDINGS Aortic arch: Please note the origin of the innominate and left common carotid arteries are not included in the field of view. Mixed plaque within the visualized aortic arch and proximal major branch vessels of the neck. No significant innominate or proximal subclavian artery stenosis within described limitations. Right carotid system: CCA and ICA patent within the neck without stenosis. Mild calcified plaque within the proximal external carotid artery. Left carotid system: The origin of the left common carotid artery is excluded from the field of view. Within this limitation, the CCA and ICA patent within the neck without  stenosis. Vertebral arteries: Codominant and patent within the neck. Mild calcified plaque at the vertebral artery origins bilaterally without significant stenosis. Skeleton: No acute bony abnormality or aggressive osseous lesion. Cervical spondylosis without high-grade bony spinal canal stenosis. Partially visualized thoracic dextrocurvature. Other neck: No soft tissue neck mass or cervical lymphadenopathy. Diminutive thyroid gland with associated calcifications. Upper chest: No consolidation within the imaged lung apices. Mild patchy ground-glass opacity within the imaged right lung apex is nonspecific but may reflect minimal edema. Review of the MIP images confirms the above findings CTA HEAD FINDINGS Anterior circulation: The intracranial internal carotid arteries are patent with scattered calcified plaque. Mild stenosis within the cavernous right ICA. Up to moderate stenosis within the paraclinoid right ICA. No more than mild stenosis within the intracranial left ICA. The M1 middle cerebral arteries are patent without significant stenosis. No M2 proximal branch occlusion or high-grade proximal stenosis is identified. The anterior cerebral arteries are patent without high-grade proximal stenosis. 1-2 mm tiny aneurysm versus infundibulum projecting inferiorly from the supraclinoid right ICA (series 13, image 87). Posterior circulation: The intracranial  vertebral arteries are patent without significant stenosis, as is the basilar artery. Minimal calcified plaque within the proximal basilar artery. The bilateral posterior cerebral arteries are patent without significant proximal stenosis. Venous sinuses: Within limitations of contrast timing, no convincing thrombus. Anatomic variants: Posterior communicating arteries are poorly delineated and may be hypoplastic or absent bilaterally. Review of the MIP images confirms the above findings IMPRESSION: CT head: 1. No evidence of acute intracranial abnormality. 2.  Stable, moderate generalized parenchymal atrophy and chronic small vessel ischemic disease. CTA neck: 1. The origin of the innominate artery is excluded from the field of view. 2. The origin of the left common carotid arteries excluded from the field of view. Within this limitation, the bilateral common carotid, internal carotid and vertebral arteries are patent within the neck without significant stenosis. Mild atherosclerotic disease within these vessels as described. No evidence of dissection. 3. Patchy ground-glass opacity within the imaged right lung apex. Findings are nonspecific but may reflect minimal edema. Correlate clinically for infection. CTA head: 1. No intracranial large vessel occlusion or proximal high-grade arterial stenosis. 2. Calcified plaque within the intracranial internal carotid arteries. Within the right ICA, there is mild stenosis within the cavernous segment and up to moderate stenosis within the paraclinoid segment. No more than mild stenosis within the intracranial left ICA. 3. 1-2 mm tiny aneurysm versus infundibulum arising from the supraclinoid right ICA. Electronically Signed   By: Kellie Simmering DO   On: 03/05/2020 20:04   CT Angio Neck W and/or Wo Contrast  Result Date: 03/05/2020 CLINICAL DATA:  Dizziness, rule out dissection; cerebral hemorrhage suspected. Additional history provided: Weakness and numerous falls for 1 week. EXAM: CT ANGIOGRAPHY HEAD AND NECK TECHNIQUE: Multidetector CT imaging of the head and neck was performed using the standard protocol during bolus administration of intravenous contrast. Multiplanar CT image reconstructions and MIPs were obtained to evaluate the vascular anatomy. Carotid stenosis measurements (when applicable) are obtained utilizing NASCET criteria, using the distal internal carotid diameter as the denominator. CONTRAST:  39mL OMNIPAQUE IOHEXOL 350 MG/ML SOLN COMPARISON:  CT head 07/10/2011 FINDINGS: CT HEAD FINDINGS Brain: There is no  evidence of acute intracranial hemorrhage, intracranial mass, midline shift or extra-axial fluid collection.No demarcated cortical infarction. Moderate ill-defined hypoattenuation within the cerebral white matter is nonspecific, but consistent with chronic small vessel ischemic disease. Stable, moderate generalized parenchymal atrophy. Vascular: Reported separately. Skull: Normal. Negative for fracture or focal lesion. Sinuses: No significant paranasal sinus disease or mastoid effusion at the imaged levels. Orbits: Visualized orbits demonstrate no acute abnormality. Review of the MIP images confirms the above findings CTA NECK FINDINGS Aortic arch: Please note the origin of the innominate and left common carotid arteries are not included in the field of view. Mixed plaque within the visualized aortic arch and proximal major branch vessels of the neck. No significant innominate or proximal subclavian artery stenosis within described limitations. Right carotid system: CCA and ICA patent within the neck without stenosis. Mild calcified plaque within the proximal external carotid artery. Left carotid system: The origin of the left common carotid artery is excluded from the field of view. Within this limitation, the CCA and ICA patent within the neck without stenosis. Vertebral arteries: Codominant and patent within the neck. Mild calcified plaque at the vertebral artery origins bilaterally without significant stenosis. Skeleton: No acute bony abnormality or aggressive osseous lesion. Cervical spondylosis without high-grade bony spinal canal stenosis. Partially visualized thoracic dextrocurvature. Other neck: No soft tissue neck mass or cervical lymphadenopathy.  Diminutive thyroid gland with associated calcifications. Upper chest: No consolidation within the imaged lung apices. Mild patchy ground-glass opacity within the imaged right lung apex is nonspecific but may reflect minimal edema. Review of the MIP images  confirms the above findings CTA HEAD FINDINGS Anterior circulation: The intracranial internal carotid arteries are patent with scattered calcified plaque. Mild stenosis within the cavernous right ICA. Up to moderate stenosis within the paraclinoid right ICA. No more than mild stenosis within the intracranial left ICA. The M1 middle cerebral arteries are patent without significant stenosis. No M2 proximal branch occlusion or high-grade proximal stenosis is identified. The anterior cerebral arteries are patent without high-grade proximal stenosis. 1-2 mm tiny aneurysm versus infundibulum projecting inferiorly from the supraclinoid right ICA (series 13, image 87). Posterior circulation: The intracranial vertebral arteries are patent without significant stenosis, as is the basilar artery. Minimal calcified plaque within the proximal basilar artery. The bilateral posterior cerebral arteries are patent without significant proximal stenosis. Venous sinuses: Within limitations of contrast timing, no convincing thrombus. Anatomic variants: Posterior communicating arteries are poorly delineated and may be hypoplastic or absent bilaterally. Review of the MIP images confirms the above findings IMPRESSION: CT head: 1. No evidence of acute intracranial abnormality. 2. Stable, moderate generalized parenchymal atrophy and chronic small vessel ischemic disease. CTA neck: 1. The origin of the innominate artery is excluded from the field of view. 2. The origin of the left common carotid arteries excluded from the field of view. Within this limitation, the bilateral common carotid, internal carotid and vertebral arteries are patent within the neck without significant stenosis. Mild atherosclerotic disease within these vessels as described. No evidence of dissection. 3. Patchy ground-glass opacity within the imaged right lung apex. Findings are nonspecific but may reflect minimal edema. Correlate clinically for infection. CTA head: 1.  No intracranial large vessel occlusion or proximal high-grade arterial stenosis. 2. Calcified plaque within the intracranial internal carotid arteries. Within the right ICA, there is mild stenosis within the cavernous segment and up to moderate stenosis within the paraclinoid segment. No more than mild stenosis within the intracranial left ICA. 3. 1-2 mm tiny aneurysm versus infundibulum arising from the supraclinoid right ICA. Electronically Signed   By: Kellie Simmering DO   On: 03/05/2020 20:04   MR BRAIN WO CONTRAST  Result Date: 03/05/2020 CLINICAL DATA:  Initial evaluation for focal neural deficit, stroke suspected. EXAM: MRI HEAD WITHOUT CONTRAST TECHNIQUE: Multiplanar, multiecho pulse sequences of the brain and surrounding structures were obtained without intravenous contrast. COMPARISON:  Prior CTA from earlier the same day. FINDINGS: Brain: Examination mildly degraded by motion artifact. Diffuse prominence of the CSF containing spaces compatible generalized age-related cerebral atrophy. Patchy and confluent T2/FLAIR hyperintensity within the periventricular deep white matter both cerebral hemispheres most consistent with chronic small vessel ischemic disease, moderate in nature. Patchy involvement of the pons noted. No abnormal foci of restricted diffusion to suggest acute or subacute ischemia. Gray-white matter differentiation maintained. No encephalomalacia to suggest chronic cortical infarction. No foci of susceptibility artifact to suggest acute or chronic intracranial hemorrhage. No mass lesion, midline shift or mass effect. No hydrocephalus. No extra-axial fluid collection. Pituitary gland and suprasellar region within normal limits. Midline structures intact. Vascular: Major intracranial vascular flow voids are maintained. Skull and upper cervical spine: Craniocervical junction normal. Upper cervical spine within normal limits. Bone marrow signal intensity normal. No scalp soft tissue abnormality.  Sinuses/Orbits: Patient status post bilateral ocular lens replacement. Paranasal sinuses are clear. Trace bilateral mastoid effusions,  of doubtful significance. Small amount of fluid noted layering within the nasopharynx. Other: None. IMPRESSION: 1. No acute intracranial infarct or other abnormality. 2. Age-related cerebral atrophy with moderate chronic microvascular ischemic disease. Electronically Signed   By: Jeannine Boga M.D.   On: 03/05/2020 21:45   DG Chest Port 1 View  Result Date: 03/05/2020 CLINICAL DATA:  Shortness of breath, weakness and numerous falls EXAM: PORTABLE CHEST 1 VIEW COMPARISON:  Radiograph 11/30/2019 FINDINGS: Aortic valve stent graft repair is in stable position from comparison radiograph. Stable mild cardiomegaly with a calcified aorta. No consolidation, features of edema, pneumothorax, or effusion. No acute osseous or soft tissue abnormality. No acute osseous or soft tissue abnormality. Degenerative changes are present in the imaged spine and shoulders. The osseous structures appear diffusely demineralized which may limit detection of small or nondisplaced fractures. IMPRESSION: No acute cardiopulmonary abnormality. Stable mild cardiomegaly. Stable positioning of a transcatheter aortic valve repair. Electronically Signed   By: Lovena Le M.D.   On: 03/05/2020 18:57        Scheduled Meds: . atorvastatin  80 mg Oral Daily  . chlorhexidine  15 mL Mouth Rinse BID  . docusate sodium  100 mg Oral BID  . levothyroxine  137 mcg Oral QAC breakfast  . mouth rinse  15 mL Mouth Rinse q12n4p  . metoprolol tartrate  50 mg Oral BID  . pantoprazole  40 mg Oral Daily  . risperiDONE  0.5 mg Oral BID  . sodium chloride flush  3 mL Intravenous Q12H  . warfarin  1 mg Oral Q Tue-1800  . warfarin  2 mg Oral Once per day on Sun Mon Wed Thu Fri Sat  . Warfarin - Physician Dosing Inpatient   Does not apply q1800   Continuous Infusions: . lactated ringers 50 mL/hr at 03/06/20 1836      LOS: 1 day    Time spent: 35 mins.More than 50% of that time was spent in counseling and/or coordination of care.      Shelly Coss, MD [facility] P3/08/2020, 7:59 AM

## 2020-03-07 NOTE — Consult Note (Signed)
Consultation Note Date: 03/07/2020   Patient Name: [name]  DOB: 12-Oct-1933  MRN: [id number]  Age / Sex: 86 y.o., female  PCP: [name], MD Referring Physician: Shelly Coss, MD  Reason for Consultation: Establishing goals of care and Hospice Evaluation  HPI/Patient Profile: 86 y.o. female  with past medical history of OSA on CPAP, s/p TAVR, hypothyroidism, HTN, HLD, dementia, atrial fibrillation on Coumadin, CAD s/p stent admitted on 03/05/2020 with worsening weakness since d/c from SNF 2/6 to her daughter's home. Decline attributed to advancing dementia with overall failure to thrive.   Clinical Assessment and Goals of Care: I have reviewed records and discussed care with bedside RN. She did have an episode of vomiting per RN.   I met today at Ms. [name]'s bedside. She is alert and pleasantly confused. She has no complaints. Mental state appears much improved today from what is described in previous notes. She has no complaints.   I called and spoke with her daughter, [name]. [name] confirms that her mother was staying with her but that she has continued to decline over days and weeks. [name] reports that she was weak even after leaving rehab. Seems like she has had progressive decline and poor intake and generalized weakness. We discussed options to move forward and [name] is concerned that she cannot care for her at home. [name] is a poor candidate for rehab as she was recently in rehab with limited benefit shown in status. We discussed goals moving forward in regards to focus on comfort vs trying to reverse and fix. [name] is torn. [name] is open to hospice facility. We agreed to monitor overnight and reassess in the morning to ensure that [name] is even a candidate to go to hospice facility. If she is further improved tomorrow this may be difficult. [name] agrees with plan.   All  questions/concerns addressed. Emotional support provided.   Primary Decision Maker NEXT OF KIN adult children [name] and [name]    SUMMARY OF RECOMMENDATIONS   - Reassess for recommendation for hospice facility in the morning  Code Status/Advance Care Planning:  DNR   Symptom Management:   Per attending  Palliative Prophylaxis:   Aspiration, Bowel Regimen, Delirium Protocol, Frequent Pain Assessment and Turn Reposition  Psycho-social/Spiritual:   Desire for further Chaplaincy support:no  Additional Recommendations: Caregiving  Support/Resources, Education on Hospice and Grief/Bereavement Support  Prognosis:   To be determined. Potentially eligible for hospice facility. She is eligible for hospice overall.   Discharge Planning: To Be Determined      Primary Diagnoses: Present on Admission: . Advanced dementia ([place]) . Stage 3a chronic kidney disease . Essential hypertension . Hyperlipidemia . Hypothyroidism . Persistent atrial fibrillation ([place]) . Sleep apnea   I have reviewed the medical record, interviewed the patient and family, and examined the patient. The following aspects are pertinent.  Past Medical History:  Diagnosis Date  . Atrial fibrillation, persistent (H[facility])    Atrial fibrillation  . CAD (coronary artery disease)    a.  01/2015 NSTEMI/PCI: LM nl, LAD 30p, LCX nondom, 30-40p, 50d, OM1 100p (2.75x16 Rebel BMS), RCA mild diff plaque, EF 55%.  . Cancer ([place])   . Candida infection, disseminated ([place])   . Dementia ([place])   . Hyperlipidemia   . Hypertension   . Hypothyroid   . Osteoporosis   . S/P TAVR (transcatheter aortic valve replacement)    s/p TAVR with 41m Edwards S3U THV via the TF approach  . Severe aortic stenosis   . Sleep apnea 04/2012   Mild/ AHI 10/CPAP 7cm h2o w/2 L o2   Social History   Socioeconomic History  . Marital status: Widowed    Spouse name: Not on file  . Number of children: Not on file  . Years of education: Not on  file  . Highest education level: Not on file  Occupational History  . Not on file  Tobacco Use  . Smoking status: Former Smoker    Quit date: 04/28/1995    Years since quitting: 24.8  . Smokeless tobacco: Never Used  Substance and Sexual Activity  . Alcohol use: No    Alcohol/week: 0.0 standard drinks  . Drug use: No  . Sexual activity: Not on file  Other Topics Concern  . Not on file  Social History Narrative  . Not on file   Social Determinants of Health   Financial Resource Strain:   . Difficulty of Paying Living Expenses: Not on file  Food Insecurity:   . Worried About RCharity fundraiserin the Last Year: Not on file  . Ran Out of Food in the Last Year: Not on file  Transportation Needs:   . Lack of Transportation (Medical): Not on file  . Lack of Transportation (Non-Medical): Not on file  Physical Activity:   . Days of Exercise per Week: Not on file  . Minutes of Exercise per Session: Not on file  Stress:   . Feeling of Stress : Not on file  Social Connections:   . Frequency of Communication with Friends and Family: Not on file  . Frequency of Social Gatherings with Friends and Family: Not on file  . Attends Religious Services: Not on file  . Active Member of Clubs or Organizations: Not on file  . Attends CArchivistMeetings: Not on file  . Marital Status: Not on file   Family History  Problem Relation Age of Onset  . Coronary artery disease Father   . Diabetes Father   . Stroke Mother   . Arthritis Mother    Scheduled Meds: . atorvastatin  80 mg Oral Daily  . chlorhexidine  15 mL Mouth Rinse BID  . docusate sodium  100 mg Oral BID  . levothyroxine  137 mcg Oral QAC breakfast  . mouth rinse  15 mL Mouth Rinse q12n4p  . metoprolol tartrate  50 mg Oral BID  . pantoprazole  40 mg Oral Daily  . risperiDONE  0.5 mg Oral BID  . sodium chloride flush  3 mL Intravenous Q12H  . warfarin  1 mg Oral Q Tue-1800  . warfarin  2 mg Oral Once per day on Sun  Mon Wed Thu Fri Sat  . Warfarin - Physician Dosing Inpatient   Does not apply q1800   Continuous Infusions: . lactated ringers 75 mL/hr at 03/07/20 1234   PRN Meds:.acetaminophen **OR** acetaminophen, ALPRAZolam, ondansetron **OR** ondansetron (ZOFRAN) IV, traMADol Allergies  Allergen Reactions  . Fosamax [Alendronate Sodium] Other (See Comments)    Unknown per  daughter   Review of Systems  Unable to perform ROS: Dementia    Physical Exam Vitals and nursing note reviewed.  Constitutional:      General: She is not in acute distress.    Appearance: She is ill-appearing.  Cardiovascular:     Rate and Rhythm: Normal rate.  Pulmonary:     Effort: Pulmonary effort is normal. No tachypnea, accessory muscle usage or respiratory distress.  Abdominal:     Palpations: Abdomen is soft.  Neurological:     Mental Status: She is alert. She is confused.     Vital Signs: BP (!) 103/56 (BP Location: Left Arm)   Pulse 64   Temp 99.1 F (37.3 C) (Axillary)   Resp 18   Ht '4\' 9"'  (1.448 m)   Wt 81.1 kg   SpO2 98%   BMI 38.69 kg/m  Pain Scale: PAINAD POSS *See Group Information*: S-Acceptable,Sleep, easy to arouse Pain Score: 0-No pain   SpO2: SpO2: 98 % O2 Device:SpO2: 98 % O2 Flow Rate: .   IO: Intake/output summary:   Intake/Output Summary (Last 24 hours) at 03/07/2020 1405 Last data filed at 03/07/2020 0907 Gross per 24 hour  Intake 565.11 ml  Output 350 ml  Net 215.11 ml    LBM:   Baseline Weight: Weight: 81.1 kg Most recent weight: Weight: 81.1 kg     Palliative Assessment/Data:     Time In: 1630 Time Out: 1710 Time Total: 40 min Greater than 50%  of this time was spent counseling and coordinating care related to the above assessment and plan.  Signed by: Vinie Sill, NP Palliative Medicine Team Pager # [phone] (M-F 8a-5p) Team Phone # [phone] (Nights/Weekends)

## 2020-03-07 NOTE — Social Work (Addendum)
CSW has requested consult for PMT to discuss [facility] with pt and pt family. TOC team able to be part of this discussion for overall outlook/disposition planning. Pt (per documentation of RN, RT and RD) is unable to take oral medications and eat/drink. Would not meet criteria for SNF authorization and per chart review does not have coverage for LTC SNF with hospice meaning it would be a private pay.   TOC team will follow and support disposition. [facility] also following as referral was made to [facility].   [name], MSW, [place] Work

## 2020-03-07 NOTE — Progress Notes (Signed)
NEUROLOGY PROGRESS NOTE   Subjective: The patient is awake, in no apparent distress, feels bad about spilling her Coke on the floor.  She is not sure where she is but able to follow commands without difficulty.  Exam: Vitals:   03/07/20 0436 03/07/20 0925  BP: (!) 103/56   Pulse: (!) 55 64  Resp: 18   Temp: 99.1 F (37.3 C)   SpO2: 98%     Physical Exam  Constitutional: Appears well-developed and well-nourished.  Psych: Affect appropriate to situation Eyes: No scleral injection HENT: No OP obstrucion Head: Normocephalic.  Cardiovascular: Normal rate and regular rhythm.  Respiratory: Effort normal, non-labored breathing GI: Soft.  No distension. There is no tenderness.  Skin: WDI  Neuro:  Mental Status: Alert, not oriented to place.  Speech fluent without evidence of aphasia.  Able to follow 3 step commands without difficulty. Cranial Nerves: II:  Visual fields grossly normal,  III,IV, VI: ptosis not present, extra-ocular motions intact bilaterally pupils equal, round, reactive to light and accommodation V,VII: smile symmetric, facial light touch sensation normal bilaterally VIII: hearing normal bilaterally Motor: Moving all extremities antigravity Sensory: Pinprick and light touch intact throughout, bilaterally Deep Tendon Reflexes: 2+ and symmetric in the biceps Cerebellar: normal finger-to-nose    Medications:  Scheduled: . atorvastatin  80 mg Oral Daily  . chlorhexidine  15 mL Mouth Rinse BID  . docusate sodium  100 mg Oral BID  . levothyroxine  137 mcg Oral QAC breakfast  . mouth rinse  15 mL Mouth Rinse q12n4p  . metoprolol tartrate  50 mg Oral BID  . pantoprazole  40 mg Oral Daily  . risperiDONE  0.5 mg Oral BID  . sodium chloride flush  3 mL Intravenous Q12H  . warfarin  1 mg Oral Q Tue-1800  . warfarin  2 mg Oral Once per day on Sun Mon Wed Thu Fri Sat  . Warfarin - Physician Dosing Inpatient   Does not apply q1800   Continuous: . lactated ringers 50  mL/hr at 03/06/20 1836    Pertinent Labs/Diagnostics: Ammonia 10 TSH 2.832  CTA head and neck IMPRESSION: CT head: 1. No evidence of acute intracranial abnormality. 2. Stable, moderate generalized parenchymal atrophy and chronic small vessel ischemic disease. CTA neck: 1. The origin of the innominate artery is excluded from the field of view. 2. The origin of the left common carotid arteries excluded from the field of view. Within this limitation, the bilateral common carotid, internal carotid and vertebral arteries are patent within the neck without significant stenosis. Mild atherosclerotic disease within these vessels as described. No evidence of dissection. 3. Patchy ground-glass opacity within the imaged right lung apex. Findings are nonspecific but may reflect minimal edema. Correlate clinically for infection. CTA head: 1. No intracranial large vessel occlusion or proximal high-grade arterial stenosis. 2. Calcified plaque within the intracranial internal carotid arteries. Within the right ICA, there is mild stenosis within the cavernous segment and up to moderate stenosis within the paraclinoid segment. No more than mild stenosis within the intracranial left ICA. 3. 1-2 mm tiny aneurysm versus infundibulum arising from the supraclinoid right ICA. Electronically Signed   By: Kellie Simmering DO   On: 03/05/2020 20:04   MR BRAIN WO CONTRAST Result Date: 03/05/2020  IMPRESSION: 1. No acute intracranial infarct or other abnormality. 2. Age-related cerebral atrophy with moderate chronic microvascular ischemic disease. Electronically Signed   By: Jeannine Boga M.D.   On: 03/05/2020 21:45   DG Chest Port 1  View Result Date: 03/05/2020  IMPRESSION: No acute cardiopulmonary abnormality. Stable mild cardiomegaly. Stable positioning of a transcatheter aortic valve repair. Electronically Signed   By: Lovena Le M.D.   On: 03/05/2020 18:57    Etta Quill PA-C [facility] [id number]  Assessment: 86 year old female with dementia and multifactorial cognitive decline. Per daughter, there may be a motivational component to the presentation.  1. MRI did rule out stroke.   2. No myelopathic findings on exam to suggest central pathology.   3. Ammonia and TSH were within normal levels.   4. Today patient has significantly improved, she is jovial and able to follow commands.  She remains confused; however, she does have dementia.  Recommendations: 1. At this point patient has improved significantly.  2. No further recommendations from a neurological standpoint. 3. Neurology will sign off.  Please call with any questions.   Electronically signed: Dr. Kerney Elbe 03/07/2020, 10:13 AM

## 2020-03-08 DIAGNOSIS — R627 Adult failure to thrive: Secondary | ICD-10-CM

## 2020-03-08 DIAGNOSIS — Z7189 Other specified counseling: Secondary | ICD-10-CM

## 2020-03-08 DIAGNOSIS — Z515 Encounter for palliative care: Secondary | ICD-10-CM

## 2020-03-08 MED ORDER — HALOPERIDOL 0.5 MG PO TABS
0.5000 mg | ORAL_TABLET | ORAL | Status: DC | PRN
  Filled 2020-03-08: qty 1

## 2020-03-08 MED ORDER — HALOPERIDOL LACTATE 2 MG/ML PO CONC
0.5000 mg | ORAL | Status: DC | PRN
  Filled 2020-03-08: qty 0.3

## 2020-03-08 MED ORDER — METOPROLOL TARTRATE 25 MG PO TABS: 12.5000 mg | ORAL_TABLET | Freq: Two times a day (BID) | ORAL | Status: DC

## 2020-03-08 MED ORDER — MORPHINE SULFATE (PF) 2 MG/ML IV SOLN
1.0000 mg | INTRAVENOUS | Status: DC | PRN
  Administered 2020-03-08 – 2020-03-09 (×2): 1 mg via INTRAVENOUS
  Filled 2020-03-08 (×2): qty 1

## 2020-03-08 MED ORDER — MORPHINE SULFATE (CONCENTRATE) 10 MG/0.5ML PO SOLN: 5.0000 mg | ORAL | Status: DC | PRN

## 2020-03-08 MED ORDER — BIOTENE DRY MOUTH MT LIQD: 15.0000 mL | OROMUCOSAL | Status: DC | PRN

## 2020-03-08 MED ORDER — GLYCOPYRROLATE 1 MG PO TABS
1.0000 mg | ORAL_TABLET | ORAL | Status: DC | PRN
  Filled 2020-03-08: qty 1

## 2020-03-08 MED ORDER — POLYVINYL ALCOHOL 1.4 % OP SOLN
1.0000 [drp] | Freq: Four times a day (QID) | OPHTHALMIC | Status: DC | PRN
  Filled 2020-03-08: qty 15

## 2020-03-08 MED ORDER — GLYCOPYRROLATE 0.2 MG/ML IJ SOLN
0.2000 mg | INTRAMUSCULAR | Status: DC | PRN
  Administered 2020-03-08: 0.2 mg via INTRAVENOUS
  Filled 2020-03-08: qty 1

## 2020-03-08 MED ORDER — HALOPERIDOL LACTATE 5 MG/ML IJ SOLN: 0.5000 mg | INTRAMUSCULAR | Status: DC | PRN

## 2020-03-08 MED ORDER — GLYCOPYRROLATE 0.2 MG/ML IJ SOLN: 0.2000 mg | INTRAMUSCULAR | Status: DC | PRN

## 2020-03-08 NOTE — Plan of Care (Signed)
  Problem: Education: Goal: Knowledge of General Education information will improve Description: Including pain rating scale, medication(s)/side effects and non-pharmacologic comfort measures Outcome: Adequate for Discharge   Problem: Health Behavior/Discharge Planning: Goal: Ability to manage health-related needs will improve Outcome: Adequate for Discharge   Problem: Clinical Measurements: Goal: Ability to maintain clinical measurements within normal limits will improve Outcome: Adequate for Discharge Goal: Will remain free from infection Outcome: Adequate for Discharge Goal: Diagnostic test results will improve Outcome: Adequate for Discharge Goal: Respiratory complications will improve Outcome: Adequate for Discharge Goal: Cardiovascular complication will be avoided Outcome: Not Applicable   Problem: Activity: Goal: Risk for activity intolerance will decrease Outcome: Not Applicable   Problem: Nutrition: Goal: Adequate nutrition will be maintained Outcome: Adequate for Discharge   Problem: Coping: Goal: Level of anxiety will decrease Outcome: Adequate for Discharge   Problem: Elimination: Goal: Will not experience complications related to bowel motility Outcome: Completed/Met Goal: Will not experience complications related to urinary retention Outcome: Completed/Met   Problem: Pain Managment: Goal: General experience of comfort will improve Outcome: Completed/Met   Problem: Pain Management: Goal: Satisfaction with pain management regimen will improve Description: Document pain management effectiveness via PAINAID assessment tool. Utilize medication for secretions. Document po care and effectiveness. Outcome: Completed/Met

## 2020-03-08 NOTE — Progress Notes (Signed)
Engineer, maintenance [facility]) Hospital Liaison note.    Hospital liaison is scheduled to meet with family 3/11 @ 9am to complete paperwork. Will notify TOC when patient can transfer.  RN please call report to [phone]. Please arrange transport for patient.    Thank you,     Farrel Gordon, RN, CCM      [place] (listed on [facility] under Hospice and [place] [facility] [place])    [phone]

## 2020-03-08 NOTE — Discharge Summary (Addendum)
Physician Discharge Summary  [name] [id number] DOB: [birth year] DOA: 03/05/2020  PCP: [name], MD  Admit date: 03/05/2020 Discharge date: 03/09/2020  Admitted From: Home Disposition:  Residential Hospice  Discharge Condition:Guarded  CODE STATUS: Comfort Care Diet recommendation:Regular  Brief/Interim Summary:  Patient is 86 year old female with history of permanent A. fib on Coumadin, coronary artery disease on Plavix, dementia, hypertension, hyperlipidemia, aortic stenosis, history of TAVR, OSA on CPAP, hypothyroidism, who presented with generalized weakness from home.  On presentation, she was somnolent, oriented to person only. She was discharged from here to skilled nursing facility on 01/18/2020 after being managed for sepsis secondary to UTI, AKI, upper GI bleed.  Currently she has been living with her daughter for last 4 weeks.  Patient was noted to be very weak at home.  Also fell at home.  Daughter requesting for long-term placement . Neurology was also consulted on presentation.  Brain imagings was negative for stroke. After discussion with the daughter, palliative care also requested.  Given her advanced dementia, multiple comorbidities, poor oral intake,frequent admissions, she is a candidate for residential hospice.  Family agreeable for transfer to residential hospice.   Following problems were addressed during hospitalization:  Deconditioning/debility/advanced dementia/generalized weakness: Continued decline.  Not eating or drinking at home.   Palliative care  consulted  Permanent A. fib: Was on Coumadin for anticoagulation.  Currently not able to take any p.o. medications.  Follows with cardiology as an outpatient.  She was in mild RVR on presentation.   Coronary artery disease: On Coumadin.  Currently not on aspirin or Plavix.  Follows with cardiology Dr. Debara Pickett  Hyperlipidemia:She was On Lipitor.  Hypothyroidism: She was on  Levothyroxine  History of severe aortic stenosis: Status post TAVR.  Advanced dementia:Oriented to self only. Confused at baseline.   Hypertension:. Was on metoprolol at home.  Currently blood pressure stable.  OSA: On CPAP at baseline.  Stage IIIa CKD: Appears stable at this time.  On gentle hydration.   Discharge Diagnoses:  Principal Problem:   Advanced dementia [place]) Active Problems:   Hyperlipidemia   Essential hypertension   Sleep apnea   Persistent atrial fibrillation (HCC)   Hypothyroidism   S/P TAVR (transcatheter aortic valve replacement)   Stage 3a chronic kidney disease   Adult failure to thrive    Discharge Instructions  Discharge Instructions    Diet general   Complete by: As directed      Allergies as of 03/09/2020      Reactions   Fosamax [alendronate Sodium] Other (See Comments)   Unknown per daughter      Medication List    STOP taking these medications   acetaminophen 500 MG tablet Commonly known as: TYLENOL   ALPRAZolam 0.5 MG tablet Commonly known as: Xanax   atorvastatin 80 MG tablet Commonly known as: LIPITOR   cholecalciferol 1000 units tablet Commonly known as: VITAMIN D   cyanocobalamin 1000 MCG tablet   furosemide 20 MG tablet Commonly known as: LASIX   levothyroxine 137 MCG tablet Commonly known as: SYNTHROID   metoprolol tartrate 50 MG tablet Commonly known as: LOPRESSOR   multivitamin with minerals tablet   pantoprazole 40 MG tablet Commonly known as: PROTONIX   potassium chloride 10 MEQ tablet Commonly known as: KLOR-CON   RisperDAL 0.5 MG tablet Generic drug: risperiDONE   traMADol 50 MG tablet Commonly known as: ULTRAM   warfarin 1 MG tablet Commonly known as: COUMADIN       Allergies  Allergen  Reactions  . Fosamax [Alendronate Sodium] Other (See Comments)    Unknown per daughter    Consultations:  Palliative care   Procedures/Studies: CT Angio Head W or Wo Contrast  Result  Date: 03/05/2020 CLINICAL DATA:  Dizziness, rule out dissection; cerebral hemorrhage suspected. Additional history provided: Weakness and numerous falls for 1 week. EXAM: CT ANGIOGRAPHY HEAD AND NECK TECHNIQUE: Multidetector CT imaging of the head and neck was performed using the standard protocol during bolus administration of intravenous contrast. Multiplanar CT image reconstructions and MIPs were obtained to evaluate the vascular anatomy. Carotid stenosis measurements (when applicable) are obtained utilizing NASCET criteria, using the distal internal carotid diameter as the denominator. CONTRAST:  6mL OMNIPAQUE IOHEXOL 350 MG/ML SOLN COMPARISON:  CT head 07/10/2011 FINDINGS: CT HEAD FINDINGS Brain: There is no evidence of acute intracranial hemorrhage, intracranial mass, midline shift or extra-axial fluid collection.No demarcated cortical infarction. Moderate ill-defined hypoattenuation within the cerebral white matter is nonspecific, but consistent with chronic small vessel ischemic disease. Stable, moderate generalized parenchymal atrophy. Vascular: Reported separately. Skull: Normal. Negative for fracture or focal lesion. Sinuses: No significant paranasal sinus disease or mastoid effusion at the imaged levels. Orbits: Visualized orbits demonstrate no acute abnormality. Review of the MIP images confirms the above findings CTA NECK FINDINGS Aortic arch: Please note the origin of the innominate and left common carotid arteries are not included in the field of view. Mixed plaque within the visualized aortic arch and proximal major branch vessels of the neck. No significant innominate or proximal subclavian artery stenosis within described limitations. Right carotid system: CCA and ICA patent within the neck without stenosis. Mild calcified plaque within the proximal external carotid artery. Left carotid system: The origin of the left common carotid artery is excluded from the field of view. Within this limitation,  the CCA and ICA patent within the neck without stenosis. Vertebral arteries: Codominant and patent within the neck. Mild calcified plaque at the vertebral artery origins bilaterally without significant stenosis. Skeleton: No acute bony abnormality or aggressive osseous lesion. Cervical spondylosis without high-grade bony spinal canal stenosis. Partially visualized thoracic dextrocurvature. Other neck: No soft tissue neck mass or cervical lymphadenopathy. Diminutive thyroid gland with associated calcifications. Upper chest: No consolidation within the imaged lung apices. Mild patchy ground-glass opacity within the imaged right lung apex is nonspecific but may reflect minimal edema. Review of the MIP images confirms the above findings CTA HEAD FINDINGS Anterior circulation: The intracranial internal carotid arteries are patent with scattered calcified plaque. Mild stenosis within the cavernous right ICA. Up to moderate stenosis within the paraclinoid right ICA. No more than mild stenosis within the intracranial left ICA. The M1 middle cerebral arteries are patent without significant stenosis. No M2 proximal branch occlusion or high-grade proximal stenosis is identified. The anterior cerebral arteries are patent without high-grade proximal stenosis. 1-2 mm tiny aneurysm versus infundibulum projecting inferiorly from the supraclinoid right ICA (series 13, image 87). Posterior circulation: The intracranial vertebral arteries are patent without significant stenosis, as is the basilar artery. Minimal calcified plaque within the proximal basilar artery. The bilateral posterior cerebral arteries are patent without significant proximal stenosis. Venous sinuses: Within limitations of contrast timing, no convincing thrombus. Anatomic variants: Posterior communicating arteries are poorly delineated and may be hypoplastic or absent bilaterally. Review of the MIP images confirms the above findings IMPRESSION: CT head: 1. No  evidence of acute intracranial abnormality. 2. Stable, moderate generalized parenchymal atrophy and chronic small vessel ischemic disease. CTA neck: 1. The origin of  the innominate artery is excluded from the field of view. 2. The origin of the left common carotid arteries excluded from the field of view. Within this limitation, the bilateral common carotid, internal carotid and vertebral arteries are patent within the neck without significant stenosis. Mild atherosclerotic disease within these vessels as described. No evidence of dissection. 3. Patchy ground-glass opacity within the imaged right lung apex. Findings are nonspecific but may reflect minimal edema. Correlate clinically for infection. CTA head: 1. No intracranial large vessel occlusion or proximal high-grade arterial stenosis. 2. Calcified plaque within the intracranial internal carotid arteries. Within the right ICA, there is mild stenosis within the cavernous segment and up to moderate stenosis within the paraclinoid segment. No more than mild stenosis within the intracranial left ICA. 3. 1-2 mm tiny aneurysm versus infundibulum arising from the supraclinoid right ICA. Electronically Signed   By: Kellie Simmering DO   On: 03/05/2020 20:04   CT Angio Neck W and/or Wo Contrast  Result Date: 03/05/2020 CLINICAL DATA:  Dizziness, rule out dissection; cerebral hemorrhage suspected. Additional history provided: Weakness and numerous falls for 1 week. EXAM: CT ANGIOGRAPHY HEAD AND NECK TECHNIQUE: Multidetector CT imaging of the head and neck was performed using the standard protocol during bolus administration of intravenous contrast. Multiplanar CT image reconstructions and MIPs were obtained to evaluate the vascular anatomy. Carotid stenosis measurements (when applicable) are obtained utilizing NASCET criteria, using the distal internal carotid diameter as the denominator. CONTRAST:  79mL OMNIPAQUE IOHEXOL 350 MG/ML SOLN COMPARISON:  CT head 07/10/2011  FINDINGS: CT HEAD FINDINGS Brain: There is no evidence of acute intracranial hemorrhage, intracranial mass, midline shift or extra-axial fluid collection.No demarcated cortical infarction. Moderate ill-defined hypoattenuation within the cerebral white matter is nonspecific, but consistent with chronic small vessel ischemic disease. Stable, moderate generalized parenchymal atrophy. Vascular: Reported separately. Skull: Normal. Negative for fracture or focal lesion. Sinuses: No significant paranasal sinus disease or mastoid effusion at the imaged levels. Orbits: Visualized orbits demonstrate no acute abnormality. Review of the MIP images confirms the above findings CTA NECK FINDINGS Aortic arch: Please note the origin of the innominate and left common carotid arteries are not included in the field of view. Mixed plaque within the visualized aortic arch and proximal major branch vessels of the neck. No significant innominate or proximal subclavian artery stenosis within described limitations. Right carotid system: CCA and ICA patent within the neck without stenosis. Mild calcified plaque within the proximal external carotid artery. Left carotid system: The origin of the left common carotid artery is excluded from the field of view. Within this limitation, the CCA and ICA patent within the neck without stenosis. Vertebral arteries: Codominant and patent within the neck. Mild calcified plaque at the vertebral artery origins bilaterally without significant stenosis. Skeleton: No acute bony abnormality or aggressive osseous lesion. Cervical spondylosis without high-grade bony spinal canal stenosis. Partially visualized thoracic dextrocurvature. Other neck: No soft tissue neck mass or cervical lymphadenopathy. Diminutive thyroid gland with associated calcifications. Upper chest: No consolidation within the imaged lung apices. Mild patchy ground-glass opacity within the imaged right lung apex is nonspecific but may reflect  minimal edema. Review of the MIP images confirms the above findings CTA HEAD FINDINGS Anterior circulation: The intracranial internal carotid arteries are patent with scattered calcified plaque. Mild stenosis within the cavernous right ICA. Up to moderate stenosis within the paraclinoid right ICA. No more than mild stenosis within the intracranial left ICA. The M1 middle cerebral arteries are patent without significant stenosis.  No M2 proximal branch occlusion or high-grade proximal stenosis is identified. The anterior cerebral arteries are patent without high-grade proximal stenosis. 1-2 mm tiny aneurysm versus infundibulum projecting inferiorly from the supraclinoid right ICA (series 13, image 87). Posterior circulation: The intracranial vertebral arteries are patent without significant stenosis, as is the basilar artery. Minimal calcified plaque within the proximal basilar artery. The bilateral posterior cerebral arteries are patent without significant proximal stenosis. Venous sinuses: Within limitations of contrast timing, no convincing thrombus. Anatomic variants: Posterior communicating arteries are poorly delineated and may be hypoplastic or absent bilaterally. Review of the MIP images confirms the above findings IMPRESSION: CT head: 1. No evidence of acute intracranial abnormality. 2. Stable, moderate generalized parenchymal atrophy and chronic small vessel ischemic disease. CTA neck: 1. The origin of the innominate artery is excluded from the field of view. 2. The origin of the left common carotid arteries excluded from the field of view. Within this limitation, the bilateral common carotid, internal carotid and vertebral arteries are patent within the neck without significant stenosis. Mild atherosclerotic disease within these vessels as described. No evidence of dissection. 3. Patchy ground-glass opacity within the imaged right lung apex. Findings are nonspecific but may reflect minimal edema. Correlate  clinically for infection. CTA head: 1. No intracranial large vessel occlusion or proximal high-grade arterial stenosis. 2. Calcified plaque within the intracranial internal carotid arteries. Within the right ICA, there is mild stenosis within the cavernous segment and up to moderate stenosis within the paraclinoid segment. No more than mild stenosis within the intracranial left ICA. 3. 1-2 mm tiny aneurysm versus infundibulum arising from the supraclinoid right ICA. Electronically Signed   By: Kellie Simmering DO   On: 03/05/2020 20:04   MR BRAIN WO CONTRAST  Result Date: 03/05/2020 CLINICAL DATA:  Initial evaluation for focal neural deficit, stroke suspected. EXAM: MRI HEAD WITHOUT CONTRAST TECHNIQUE: Multiplanar, multiecho pulse sequences of the brain and surrounding structures were obtained without intravenous contrast. COMPARISON:  Prior CTA from earlier the same day. FINDINGS: Brain: Examination mildly degraded by motion artifact. Diffuse prominence of the CSF containing spaces compatible generalized age-related cerebral atrophy. Patchy and confluent T2/FLAIR hyperintensity within the periventricular deep white matter both cerebral hemispheres most consistent with chronic small vessel ischemic disease, moderate in nature. Patchy involvement of the pons noted. No abnormal foci of restricted diffusion to suggest acute or subacute ischemia. Gray-white matter differentiation maintained. No encephalomalacia to suggest chronic cortical infarction. No foci of susceptibility artifact to suggest acute or chronic intracranial hemorrhage. No mass lesion, midline shift or mass effect. No hydrocephalus. No extra-axial fluid collection. Pituitary gland and suprasellar region within normal limits. Midline structures intact. Vascular: Major intracranial vascular flow voids are maintained. Skull and upper cervical spine: Craniocervical junction normal. Upper cervical spine within normal limits. Bone marrow signal intensity  normal. No scalp soft tissue abnormality. Sinuses/Orbits: Patient status post bilateral ocular lens replacement. Paranasal sinuses are clear. Trace bilateral mastoid effusions, of doubtful significance. Small amount of fluid noted layering within the nasopharynx. Other: None. IMPRESSION: 1. No acute intracranial infarct or other abnormality. 2. Age-related cerebral atrophy with moderate chronic microvascular ischemic disease. Electronically Signed   By: Jeannine Boga M.D.   On: 03/05/2020 21:45   DG Chest Port 1 View  Result Date: 03/05/2020 CLINICAL DATA:  Shortness of breath, weakness and numerous falls EXAM: PORTABLE CHEST 1 VIEW COMPARISON:  Radiograph 11/30/2019 FINDINGS: Aortic valve stent graft repair is in stable position from comparison radiograph. Stable mild cardiomegaly with  a calcified aorta. No consolidation, features of edema, pneumothorax, or effusion. No acute osseous or soft tissue abnormality. No acute osseous or soft tissue abnormality. Degenerative changes are present in the imaged spine and shoulders. The osseous structures appear diffusely demineralized which may limit detection of small or nondisplaced fractures. IMPRESSION: No acute cardiopulmonary abnormality. Stable mild cardiomegaly. Stable positioning of a transcatheter aortic valve repair. Electronically Signed   By: Lovena Le M.D.   On: 03/05/2020 18:57      Subjective: Patient seen and examined the bedside this morning.  Appears lethargic today, very confused.  Discharge Exam: Vitals:   03/08/20 1557 03/08/20 2105  BP: (!) 150/43 (!) 144/62  Pulse: (!) 51 (!) 54  Resp: 20 16  Temp: 97.9 F (36.6 C) 98.6 F (37 C)  SpO2: 99% 93%   Vitals:   03/07/20 2128 03/08/20 0439 03/08/20 1557 03/08/20 2105  BP: (!) 143/54 (!) 131/51 (!) 150/43 (!) 144/62  Pulse: (!) 53 (!) 47 (!) 51 (!) 54  Resp: 16 18 20 16   Temp: 98.2 F (36.8 C) 98.2 F (36.8 C) 97.9 F (36.6 C) 98.6 F (37 C)  TempSrc: Oral  Oral Oral   SpO2: 93% 97% 99% 93%  Weight:      Height:        General: Somnolent, lethargic Cardiovascular: Afib Respiratory: CTA bilaterally, no wheezing, no rhonchi Abdominal: Soft, NT, ND, bowel sounds + Extremities: no edema, no cyanosis    The results of significant diagnostics from this hospitalization (including imaging, microbiology, ancillary and laboratory) are listed below for reference.     Microbiology: Recent Results (from the past 240 hour(s))  SARS CORONAVIRUS 2 (TAT 6-24 HRS) Nasopharyngeal Nasopharyngeal Swab     Status: None   Collection Time: 03/05/20  9:32 PM   Specimen: Nasopharyngeal Swab  Result Value Ref Range Status   SARS Coronavirus 2 NEGATIVE NEGATIVE Final    Comment: (NOTE) SARS-CoV-2 target nucleic acids are NOT DETECTED. The SARS-CoV-2 RNA is generally detectable in upper and lower respiratory specimens during the acute phase of infection. Negative results do not preclude SARS-CoV-2 infection, do not rule out co-infections with other pathogens, and should not be used as the sole basis for treatment or other patient management decisions. Negative results must be combined with clinical observations, patient history, and epidemiological information. The expected result is Negative. Fact Sheet for Patients: SugarRoll.be Fact Sheet for Healthcare Providers: https://www.woods-mathews.com/ This test is not yet approved or cleared by the [place] FDA and  has been authorized for detection and/or diagnosis of SARS-CoV-2 by FDA under an Emergency Use Authorization (EUA). This EUA will remain  in effect (meaning this test can be used) for the duration of the COVID-19 declaration under Section 56 4(b)(1) of the Act, 21 U.S.C. section 360bbb-3(b)(1), unless the authorization is terminated or revoked sooner. Performed at [facility], [place] [address] [place], [place] [address]   Urine culture     Status: None    Collection Time: 03/05/20  9:50 PM   Specimen: Urine, Random  Result Value Ref Range Status   Specimen Description URINE, RANDOM  Final   Special Requests NONE  Final   Culture   Final    NO GROWTH Performed at [facility], [place] [address], [place], [place] [address]    Report Status 03/07/2020 FINAL  Final     Labs: BNP (last 3 results) Recent Labs    11/26/19 1205  BNP 286.4*   Basic  Metabolic Panel: Recent Labs  Lab 03/05/20 1757 03/06/20 0023 03/07/20 0237  NA 138 139 141  K 4.1 3.8 4.5  CL 104  --  105  CO2 24  --  25  GLUCOSE 105*  --  96  BUN 18  --  19  CREATININE 1.16*  --  1.04*  CALCIUM 8.6*  --  8.2*   Liver Function Tests: No results for input(s): AST, ALT, ALKPHOS, BILITOT, PROT, ALBUMIN in the last 168 hours. No results for input(s): LIPASE, AMYLASE in the last 168 hours. Recent Labs  Lab 03/06/20 0237  AMMONIA 10   CBC: Recent Labs  Lab 03/05/20 1757 03/06/20 0023 03/07/20 0237  WBC 8.3  --  10.2  HGB 12.1 10.9* 12.0  HCT 40.1 32.0* 38.4  MCV 94.6  --  90.6  PLT 182  --  161   Cardiac Enzymes: No results for input(s): CKTOTAL, CKMB, CKMBINDEX, TROPONINI in the last 168 hours. BNP: Invalid input(s): POCBNP CBG: Recent Labs  Lab 03/05/20 1751 03/06/20 0846  GLUCAP 91 97   D-Dimer No results for input(s): DDIMER in the last 72 hours. Hgb A1c No results for input(s): HGBA1C in the last 72 hours. Lipid Profile No results for input(s): CHOL, HDL, LDLCALC, TRIG, CHOLHDL, LDLDIRECT in the last 72 hours. Thyroid function studies No results for input(s): TSH, T4TOTAL, T3FREE, THYROIDAB in the last 72 hours.  Invalid input(s): FREET3 Anemia work up No results for input(s): VITAMINB12, FOLATE, FERRITIN, TIBC, IRON, RETICCTPCT in the last 72 hours. Urinalysis    Component Value Date/Time   COLORURINE YELLOW 03/05/2020 2155   APPEARANCEUR CLEAR 03/05/2020 2155   LABSPEC 1.026 03/05/2020 2155   PHURINE 6.0 03/05/2020 2155    GLUCOSEU NEGATIVE 03/05/2020 2155   HGBUR NEGATIVE 03/05/2020 2155   BILIRUBINUR NEGATIVE 03/05/2020 2155   KETONESUR NEGATIVE 03/05/2020 2155   PROTEINUR NEGATIVE 03/05/2020 2155   UROBILINOGEN 0.2 08/11/2014 0829   NITRITE NEGATIVE 03/05/2020 2155   LEUKOCYTESUR TRACE (A) 03/05/2020 2155   Sepsis Labs Invalid input(s): PROCALCITONIN,  WBC,  LACTICIDVEN Microbiology Recent Results (from the past 240 hour(s))  SARS CORONAVIRUS 2 (TAT 6-24 HRS) Nasopharyngeal Nasopharyngeal Swab     Status: None   Collection Time: 03/05/20  9:32 PM   Specimen: Nasopharyngeal Swab  Result Value Ref Range Status   SARS Coronavirus 2 NEGATIVE NEGATIVE Final    Comment: (NOTE) SARS-CoV-2 target nucleic acids are NOT DETECTED. The SARS-CoV-2 RNA is generally detectable in upper and lower respiratory specimens during the acute phase of infection. Negative results do not preclude SARS-CoV-2 infection, do not rule out co-infections with other pathogens, and should not be used as the sole basis for treatment or other patient management decisions. Negative results must be combined with clinical observations, patient history, and epidemiological information. The expected result is Negative. Fact Sheet for Patients: SugarRoll.be Fact Sheet for Healthcare Providers: https://www.woods-mathews.com/ This test is not yet approved or cleared by the [place] FDA and  has been authorized for detection and/or diagnosis of SARS-CoV-2 by FDA under an Emergency Use Authorization (EUA). This EUA will remain  in effect (meaning this test can be used) for the duration of the COVID-19 declaration under Section 56 4(b)(1) of the Act, 21 U.S.C. section 360bbb-3(b)(1), unless the authorization is terminated or revoked sooner. Performed at [facility], [place] [address] [place], [place] [address]   Urine culture     Status: None   Collection Time: 03/05/20  9:50 PM  Specimen: Urine, Random  Result Value Ref Range Status   Specimen Description URINE, RANDOM  Final   Special Requests NONE  Final   Culture   Final    NO GROWTH Performed at [facility], [address], [place], [place] [address]    Report Status 03/07/2020 FINAL  Final    Please note: You were cared for by a hospitalist during your hospital stay. Once you are discharged, your primary care physician will handle any further medical issues. Please note that NO REFILLS for any discharge medications will be authorized once you are discharged, as it is imperative that you return to your primary care physician (or establish a relationship with a primary care physician if you do not have one) for your post hospital discharge needs so that they can reassess your need for medications and monitor your lab values.    Time coordinating discharge: 40 minutes  SIGNED:   Shelly Coss, MD  [facility] 03/09/2020, 10:35 AM Pager [phone]  If 7PM-7AM, please contact night-coverage [email or web address] Password [email or web address]

## 2020-03-08 NOTE — Progress Notes (Addendum)
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 Recent hospitalization 1/13-1/19/21 with worsening dementia and GIB followed by rehab stay. D/C to daughter's home from rehab 02/05/20 and with general decline over weeks since rehab d/c until significant decline the few days prior to admission. No acute or reversible etiology identified. Overall adult failure to thrive.   I met today at [name]'s bedside. She is much more lethargic today but does mumble to me some but difficult to understand. I asked her if she was hungry or thirsty and she tells me "no." No family at bedside. Discussed care with Dr. Tawanna Solo and Lesleigh Noe, RN. She again today was given 2-3 bites of food and had episode of swallowing. I worry that she is not swallowing well and unable to tolerate diet. She even struggled with just a bite of ice cream.   I called and spoke with both [name] son, [name], and daughter, [name], separately. I explained my assessment above and that she appears much declined from yesterday. I anticipate that the will have good and bad days and this will continue to fluctuate. Either way I worry she will be unable to sustain herself. We discussed options from here and they both want her to be comfortable acknowledging slow decline over past months. We discussed plan to minimize medications to only those needed for comfort and transition to hospice facility for comfort care.   All questions/concerns addressed. Emotional support provided.   Exam: Alert, pleasantly confused. No distress. Breathing regular, unlabored. Abd soft, not tender. Generalized lethargy and weakness.   Plan: - Full comfort care. Medications/orders changed to reflect full comfort care.  - Transition to [facility].   73 min  Vinie Sill, NP Palliative Medicine Team Pager [phone] (Please see [email or web address] for schedule) Team Phone [phone]    Greater than 50%  of this time was spent counseling and coordinating care related to the above assessment and plan

## 2020-03-08 NOTE — Progress Notes (Addendum)
Patient vomiting bilious material / unable to eat or to swallow meds.  Lungs have coarse crackles.  Aspiration precautions initiated.  MD notified.  Patient placed on NPO status by nursing until able to safely take po, or MD states otherwise.

## 2020-03-08 NOTE — Progress Notes (Signed)
CRITICAL VALUE ALERT  Critical Value:  INR 3.4  Date & Time Notied:  03/08/2020 0342  Provider Notified: DR Sharlet Salina  Orders Received/Actions taken: awaiting

## 2020-03-08 NOTE — Social Work (Signed)
Pt has bed available at [facility], pt family cannot fill out paperwork until tomorrow, pt has a bed at [facility] when paperwork complete. Pt was being visited by sister in law [place] when [place] stopped by the room.   CSW continuing to follow for support with hospice transfer when paperwork complete. DNR signed on chart.   Westley Hummer, MSW, [place] Work

## 2020-03-08 NOTE — Progress Notes (Signed)
ANTICOAGULATION CONSULT NOTE - Pharmacy Consult for Warfarin Indication:  On chronic warfarin for h/o Afib  Allergies  Allergen Reactions  . Fosamax [Alendronate Sodium] Other (See Comments)    Unknown per daughter    Patient Measurements: Height: 4\' 9"  (144.8 cm) Weight: 178 lb 12.7 oz (81.1 kg) IBW/kg (Calculated) : 38.6   Vital Signs: Temp: 98.2 F (36.8 C) (03/10 0439) BP: 131/51 (03/10 0439) Pulse Rate: 47 (03/10 0439)  Labs: Recent Labs    03/05/20 1757 03/05/20 1902 03/06/20 0023 03/06/20 0409 03/07/20 0237 03/08/20 0222  HGB 12.1   < > 10.9*  --  12.0  --   HCT 40.1  --  32.0*  --  38.4  --   PLT 182  --   --   --  161  --   LABPROT  --    < >  --  28.0* 34.1* 49.4*  INR  --    < >  --  2.6* 3.4* 5.4*  CREATININE 1.16*  --   --   --  1.04*  --    < > = values in this interval not displayed.    Estimated Creatinine Clearance: 34.7 mL/min (A) (by C-G formula based on SCr of 1.04 mg/dL (H)).   Medical History: Past Medical History:  Diagnosis Date  . Atrial fibrillation, persistent (HCC)    Atrial fibrillation  . CAD (coronary artery disease)    a. 01/2015 NSTEMI/PCI: LM nl, LAD 30p, LCX nondom, 30-40p, 50d, OM1 100p (2.75x16 Rebel BMS), RCA mild diff plaque, EF 55%.  . Cancer ([place])   . Candida infection, disseminated ([place])   . Dementia ([place])   . Hyperlipidemia   . Hypertension   . Hypothyroid   . Osteoporosis   . S/P TAVR (transcatheter aortic valve replacement)    s/p TAVR with 80mm Edwards S3U THV via the TF approach  . Severe aortic stenosis   . Sleep apnea 04/2012   Mild/ AHI 10/CPAP 7cm h2o w/2 L o2    Medications:  Facility-Administered Medications Prior to Admission  Medication Dose Route Frequency Provider Last Rate Last Admin  . denosumab (PROLIA) injection 60 mg  60 mg Subcutaneous Q6 months [name], MD   60 mg at 11/29/19 [id number]   Medications Prior to Admission  Medication Sig Dispense Refill Last Dose  . acetaminophen  (TYLENOL) 500 MG tablet Take 1,000 mg by mouth every 6 (six) hours as needed for mild pain or headache.   unk  . ALPRAZolam (XANAX) 0.5 MG tablet Take 1 tablet (0.5 mg total) by mouth at bedtime as needed for sleep. 30 tablet 0 Past Week at Unknown time  . atorvastatin (LIPITOR) 80 MG tablet Take 80 mg by mouth daily.   03/04/2020  . cholecalciferol (VITAMIN D) 1000 UNITS tablet Take 1,000 Units by mouth daily.   03/05/2020 at Unknown time  . cyanocobalamin 1000 MCG tablet Take 1,000 mcg by mouth daily.    03/05/2020 at Unknown time  . furosemide (LASIX) 20 MG tablet Take 1 tablet (20 mg total) by mouth daily. 90 tablet 3 03/01/2020  . levothyroxine (SYNTHROID) 137 MCG tablet Take 137 mcg by mouth daily before breakfast.   03/05/2020 at Unknown time  . metoprolol tartrate (LOPRESSOR) 50 MG tablet Take 50 mg by mouth 2 (two) times daily.   03/05/2020 at 01000  . Multiple Vitamins-Minerals (MULTIVITAMIN WITH MINERALS) tablet Take 1 tablet by mouth 2 (two) times daily.   03/05/2020 at Unknown time  .  pantoprazole (PROTONIX) 40 MG tablet Take 1 tablet (40 mg total) by mouth daily. 30 tablet 1 03/05/2020 at Unknown time  . potassium chloride (KLOR-CON) 10 MEQ tablet Take 1 tablet (10 mEq total) by mouth daily. 90 tablet 3 03/01/2020  . RISPERDAL 0.5 MG tablet TAKE 1 TABLET BY MOUTH TWICE A DAY (Patient taking differently: Take 0.5 mg by mouth 2 (two) times daily. ) 180 tablet 1 03/05/2020 at Unknown time  . traMADol (ULTRAM) 50 MG tablet Take 1 tablet (50 mg total) by mouth every 8 (eight) hours as needed. 30 tablet 0 unk  . warfarin (COUMADIN) 1 MG tablet Take 1-2 mg by mouth See admin instructions. Take 1mg  on Tuesday and all other days take  2 tablets = 2mg  daily.   03/04/2020 at 1800    Assessment: 86 y.o femalewith advanced dementia who presented on 03/05/20 with generalized weakness from home  on chronic warfarin PTA for history of Afib.  INR on admit was 2.3 on 03/05/20. PTA dosing: 2mg  daily except Tue take 1mg , last  taken on 3/6. No meal intake per RN.   INR up to 5.4 today Patient vomiting bilious material / unable to eat or to swallow meds.   Has not received any warfarin since 3/6 PTA.   Goal of Therapy:  INR 2-3 Monitor platelets by anticoagulation protocol: Yes   Plan:  Hold warfarin today Daily INR Monitor for bleeding  Thank you for allowing pharmacy to be part of this patients care team.  Nicole Cella, RPh Clinical Pharmacist  Please check AMION for all [place] phone numbers After 10:00 PM, call [place] [phone] 03/08/2020,11:58 AM

## 2020-03-08 NOTE — Social Work (Signed)
CSW aware pt family interested in [facility]. [facility] has been following for family interest. CSW has secure messaged Bevely Palmer, RN liaison with [facility] with pt family interest and bed availability.   Westley Hummer, MSW, [place] Work

## 2020-03-09 LAB — PROTIME-INR
INR: 5.4 (ref 0.8–1.2)
Prothrombin Time: 49.4 seconds — ABNORMAL HIGH (ref 11.4–15.2)

## 2020-03-09 NOTE — Plan of Care (Signed)
  Problem: Safety: Goal: Ability to remain free from injury will improve Outcome: Progressing   Problem: Skin Integrity: Goal: Risk for impaired skin integrity will decrease Outcome: Progressing   Problem: Education: Goal: Knowledge of the prescribed therapeutic regimen will improve Outcome: Progressing   Problem: Coping: Goal: Ability to identify and develop effective coping behavior will improve Outcome: Progressing   Problem: Clinical Measurements: Goal: Quality of life will improve Outcome: Progressing   Problem: Respiratory: Goal: Verbalizations of increased ease of respirations will increase Outcome: Progressing   Problem: Role Relationship: Goal: Family's ability to cope with current situation will improve Outcome: Progressing Goal: Ability to verbalize concerns, feelings, and thoughts to partner or family member will improve Outcome: Progressing

## 2020-03-09 NOTE — Progress Notes (Signed)
Helene Kelp RN with [employer] [facility] called and given [facility] report. [facility] not yet arrived to transport patient to facility.

## 2020-03-09 NOTE — Social Work (Signed)
Clinical Social Worker facilitated patient discharge including contacting patient family and facility to confirm patient discharge plans.  Clinical information faxed to facility and family agreeable with plan.  CSW arranged ambulance transport via [facility] to [facility].  RN to call [phone] with report prior to discharge.  Clinical Social Worker will sign off for now as social work intervention is no longer needed. Please consult us again if new need arises.  Daleah Coulson, MSW, LCSW Clinical Social Worker   

## 2020-03-09 NOTE — Progress Notes (Signed)
Patient seen and examined the bedside this morning.  Hemodynamically stable.  No new changes since yesterday.  Remains very weak, confused.  Discharge orders and summary already in place for transfer to residential hospice.

## 2020-03-10 ENCOUNTER — Telehealth: Payer: Self-pay | Admitting: Internal Medicine

## 2020-03-10 NOTE — Telephone Encounter (Signed)
FYI - [name] died in the hospital - looks like failure to thrive with dementia. She had successful TAVR last year. Thanks for helping her with the AS.  -Mali

## 2020-03-10 NOTE — Telephone Encounter (Signed)
She was very sweet. Thanks for letting me know.

## 2020-03-10 NOTE — Telephone Encounter (Signed)
Patients daughter calling in to let Dr. Debara Pickett know that the patient passed away this morning.

## 2020-03-13 ENCOUNTER — Ambulatory Visit: Payer: Medicare Other | Admitting: Family Medicine

## 2020-03-21 DIAGNOSIS — Z515 Encounter for palliative care: Secondary | ICD-10-CM

## 2020-03-29 ENCOUNTER — Ambulatory Visit: Payer: Medicare Other | Admitting: Internal Medicine

## 2020-03-30 DEATH — deceased

## 2020-11-24 IMAGING — DX RIGHT TIBIA AND FIBULA - 2 VIEW
4 series · 4 of 4 positions shown · non-contrast
Comparison: 09/05/2009

CLINICAL DATA: States she was getting up to go to the bathroom and
fell, c/o knot on her head . States she twisted her left leg. C/o
headache Daughter states she hit her head on the bottom of the
dresser. Happened approx. 033am

EXAM:
RIGHT TIBIA AND FIBULA - 2 VIEW

[tibia ap (1 of 2)]
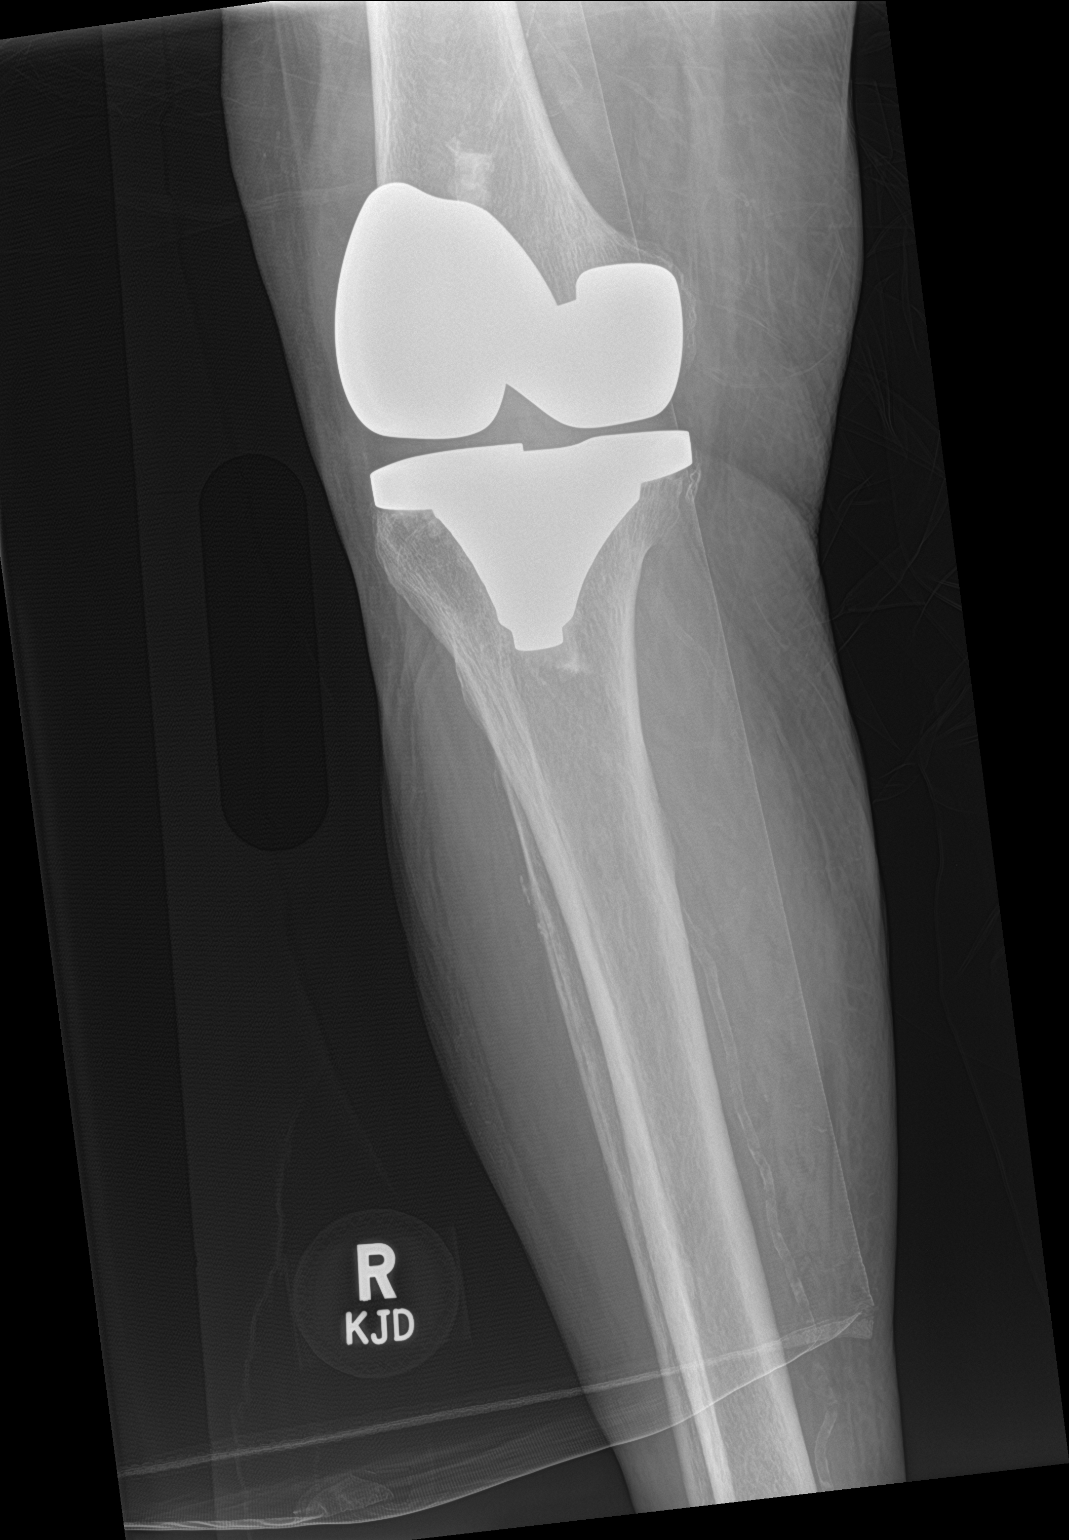

[tibia ap (2 of 2)]
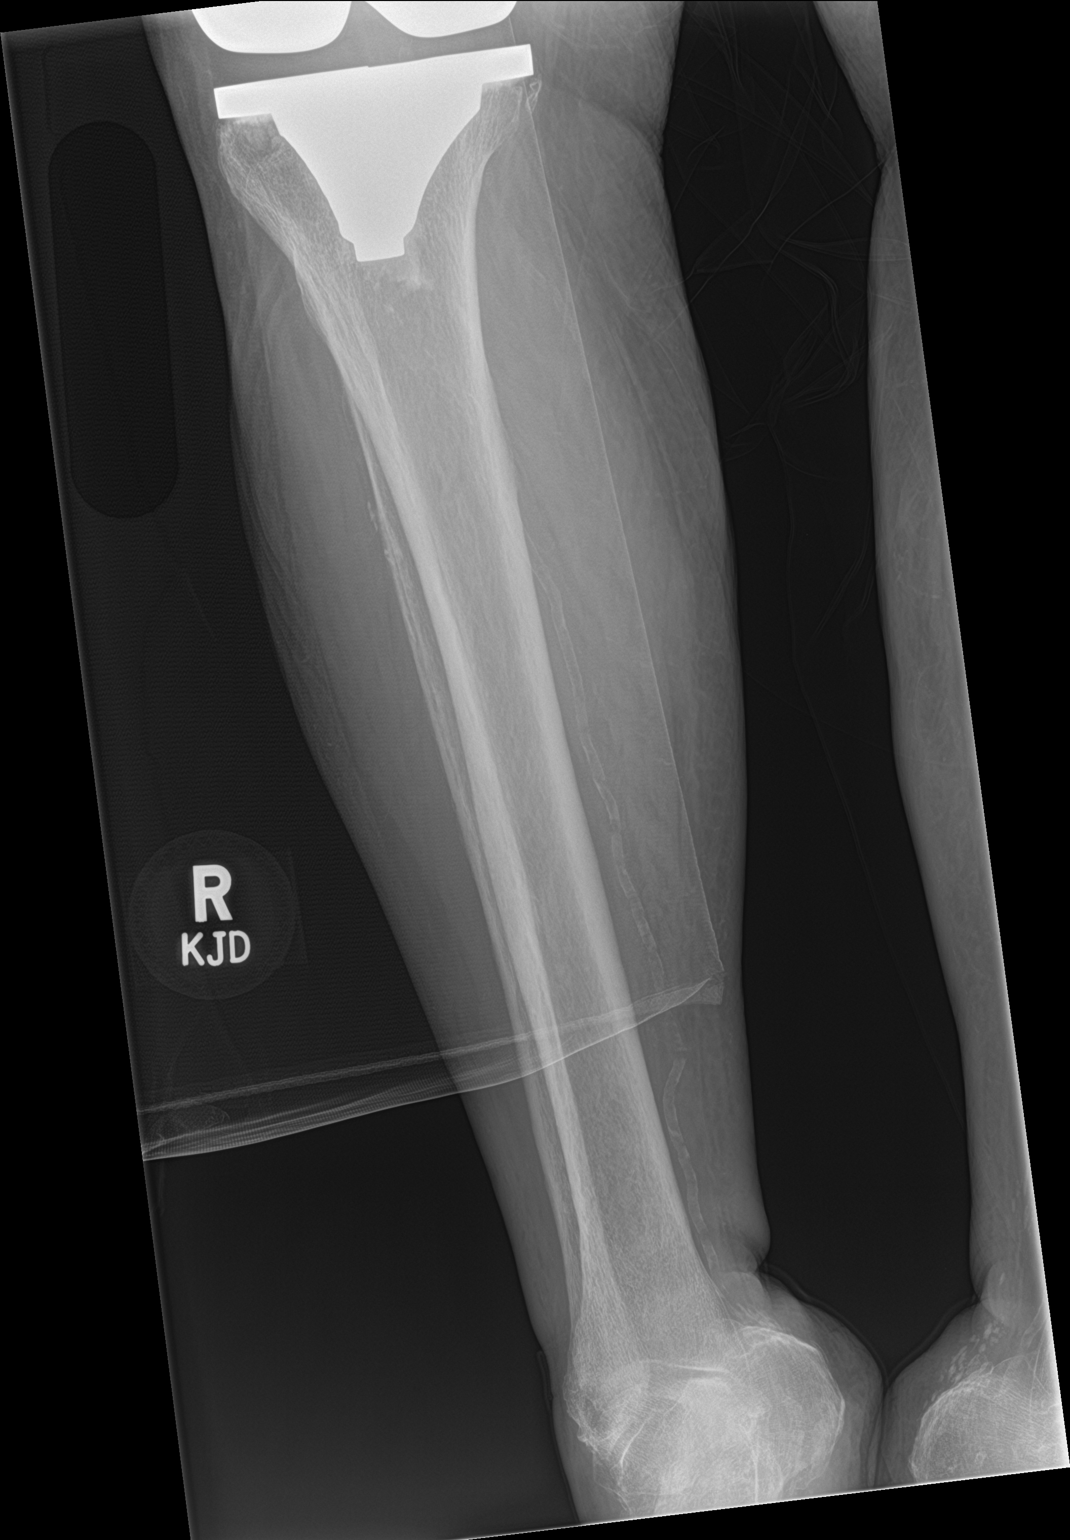

[tibia lat (1 of 2)]
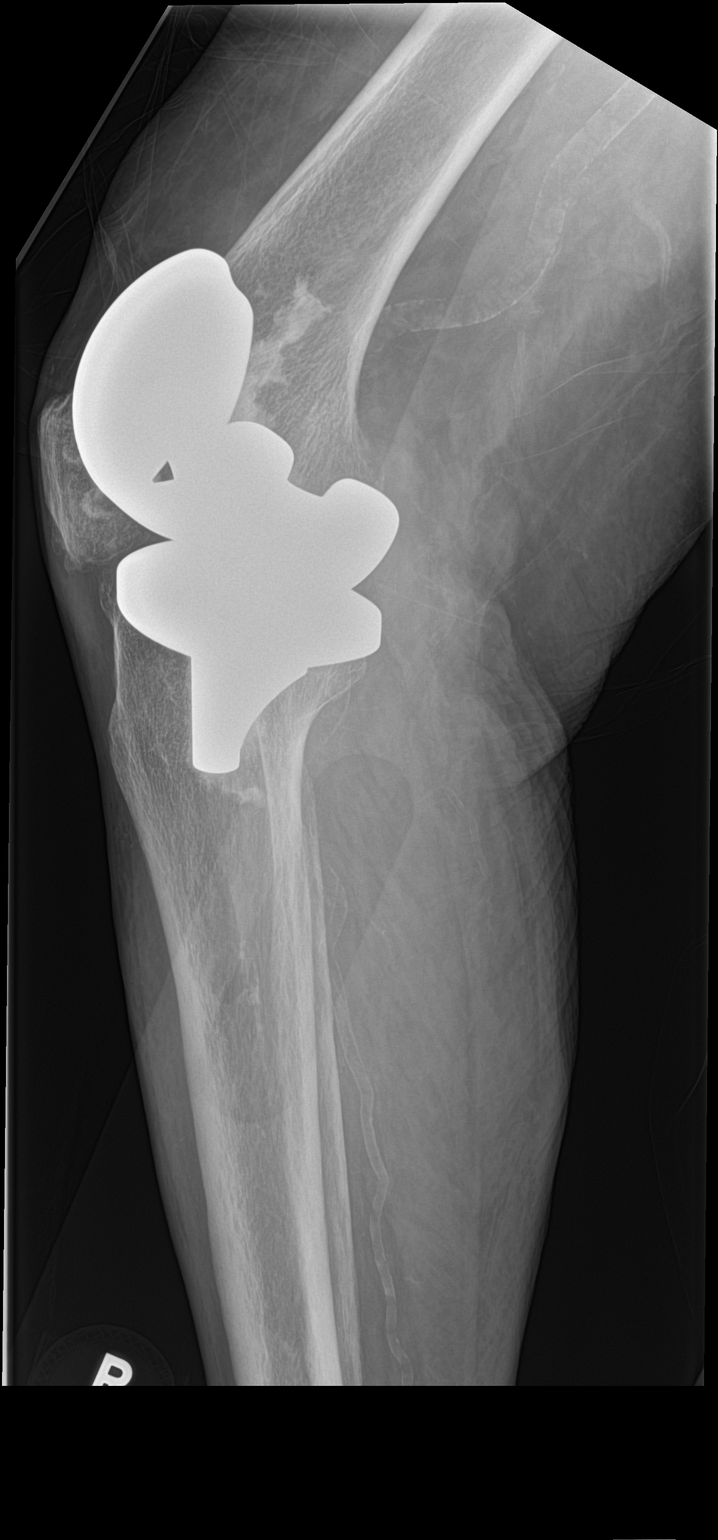

[tibia lat (2 of 2)]
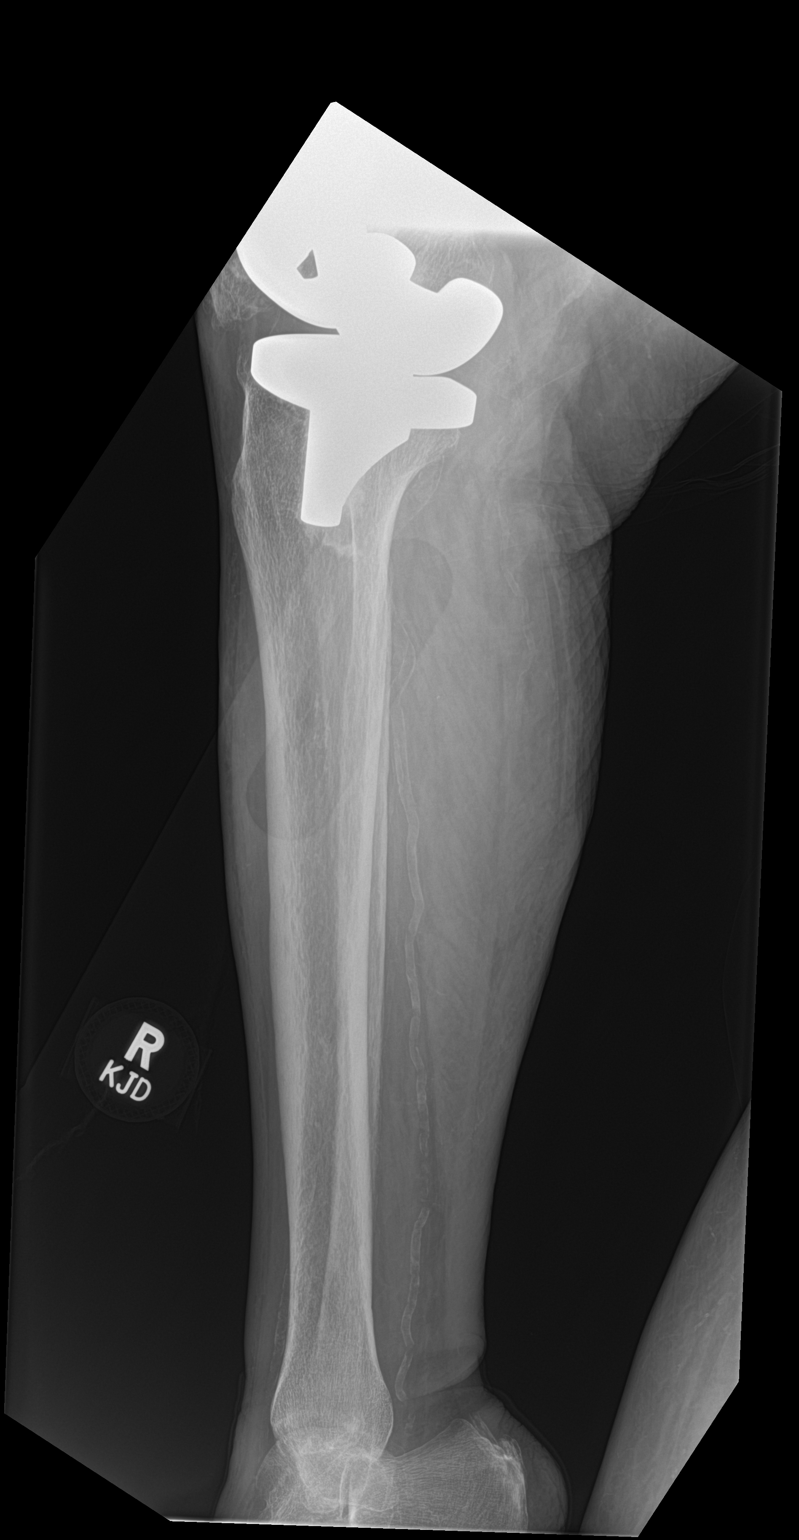

[4 of 4 positions shown; findings below may reference images not displayed]

FINDINGS: No fracture or bone lesion.

Knee joint orthopedic hardware appears well seated and aligned.

Ankle joint is grossly normally aligned.

Skeletal structures are demineralized.

Soft tissues demonstrate arterial vascular calcifications but are
otherwise unremarkable.
IMPRESSION: 1. No fracture, dislocation or acute finding. No evidence of
loosening/disruption of the knee joint prosthesis hardware.

## 2020-11-24 IMAGING — DX DG HIP (WITH OR WITHOUT PELVIS) 2-3V RIGHT
3 series · 3 of 3 positions shown · non-contrast
Comparison: None.

CLINICAL DATA: States she was getting up to go to the bathroom and
fell, c/o knot on her head . States she twisted her left leg. C/o
headache Daughter states she hit her head on the bottom of the
dresser. Happened approx. 828am

EXAM:
DG HIP (WITH OR WITHOUT PELVIS) 2-3V RIGHT

[pelvis ap]
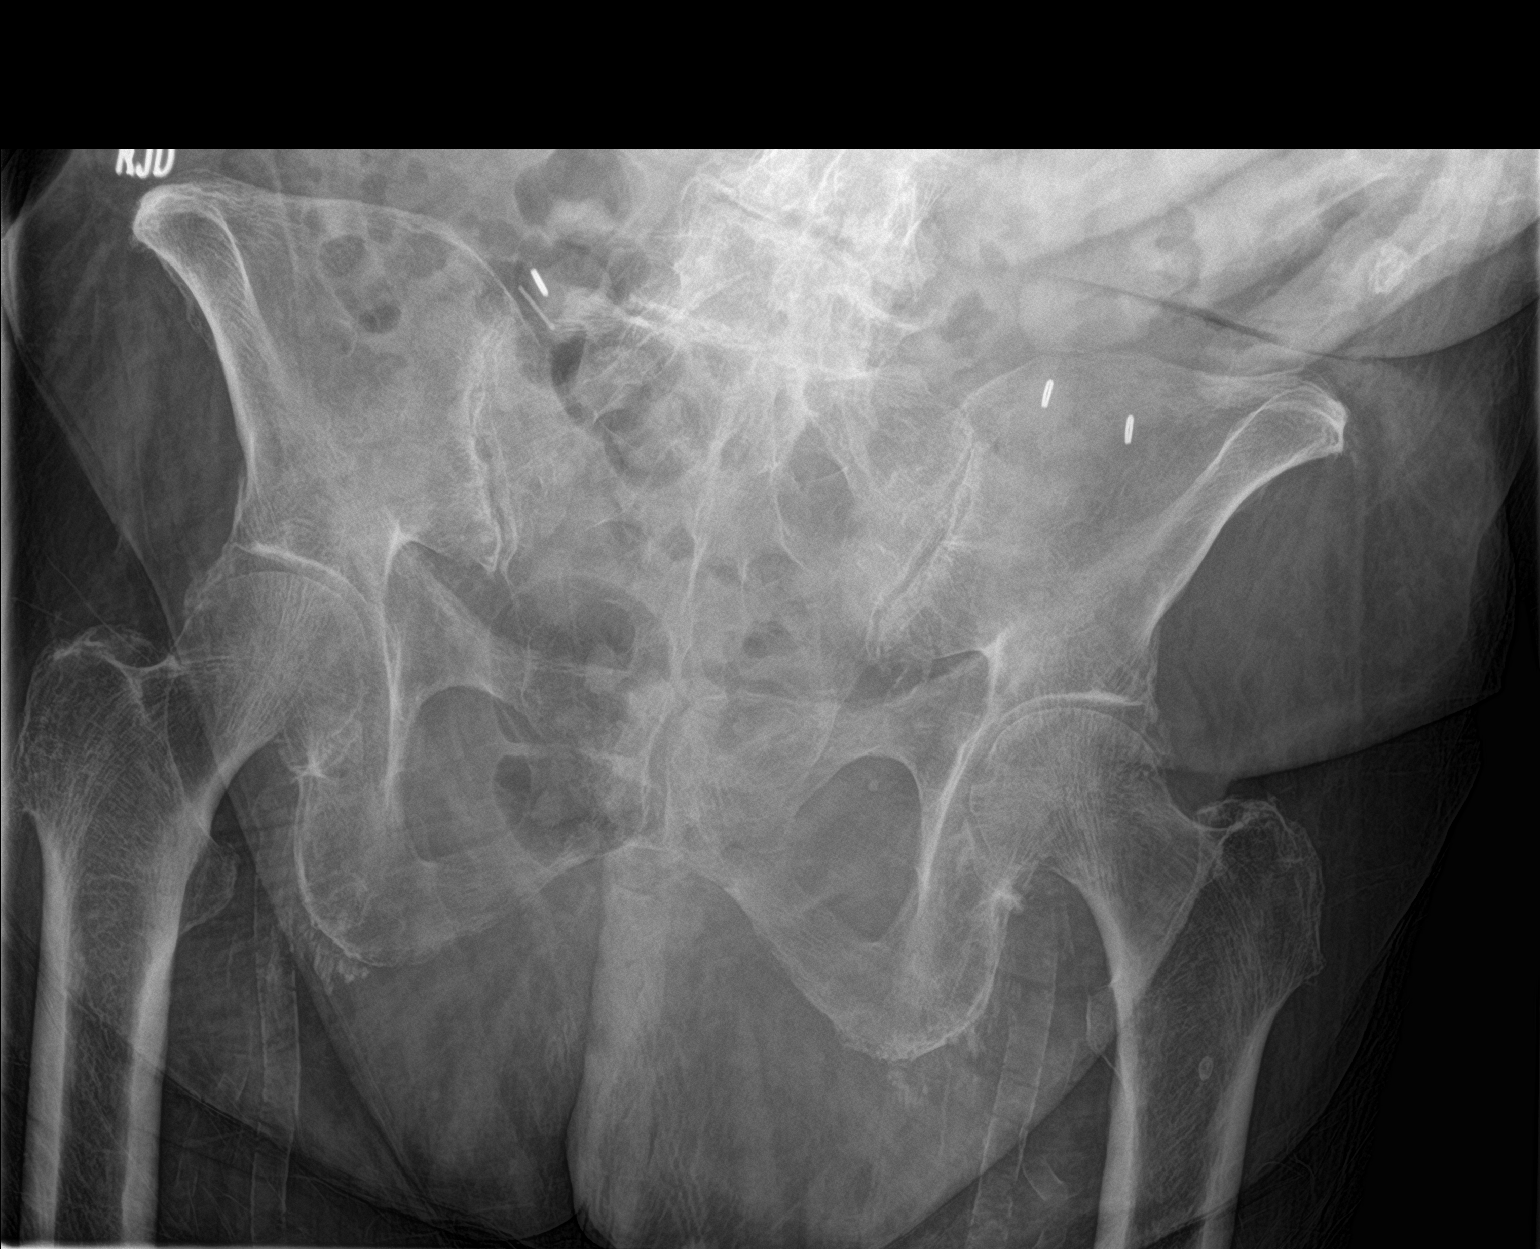

[hip ap]
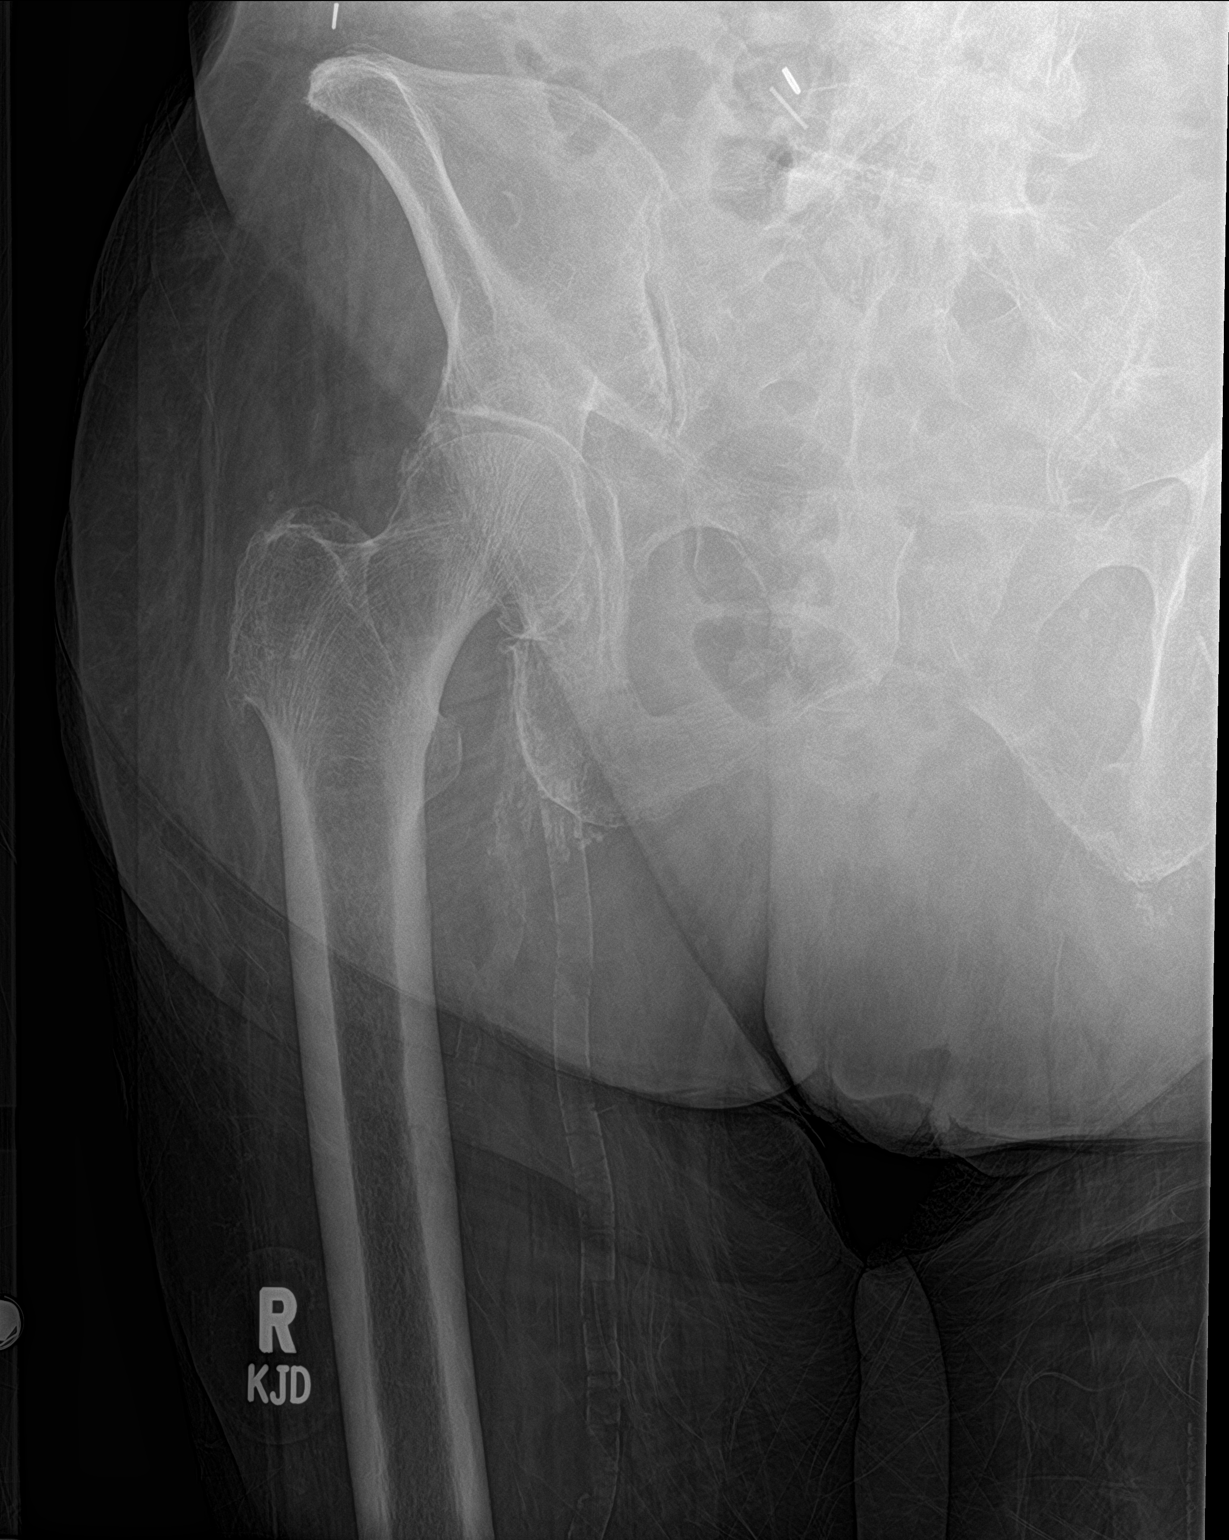

[hip lat]
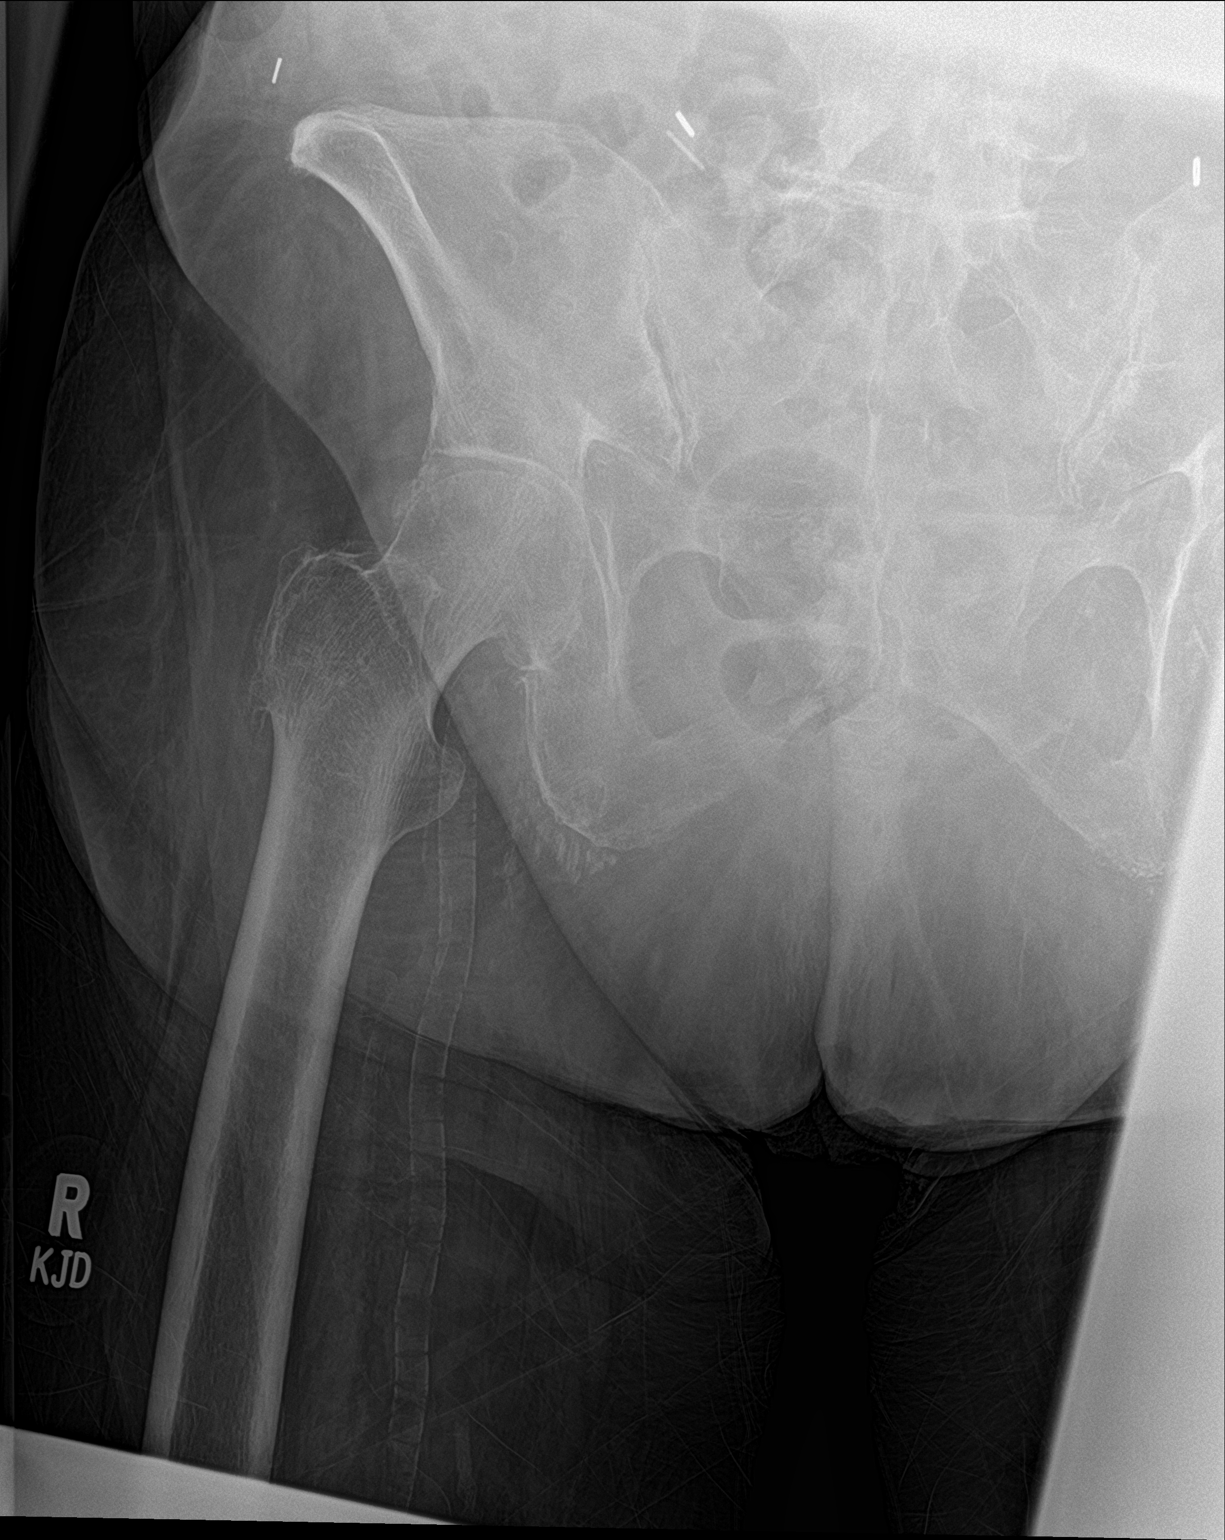

[3 of 3 positions shown; findings below may reference images not displayed]

FINDINGS: No fracture or bone lesion.

Hip joints, SI joints and symphysis pubis are normally aligned.

There are dense femoral artery vascular calcifications. Surgical
vascular clips are noted in the lower abdomen.
IMPRESSION: No fracture or acute finding.

## 2020-11-24 IMAGING — CT CT CERVICAL SPINE WITHOUT CONTRAST
5 of 8 series · 12 of 33 positions shown, 13 images · non-contrast
Comparison: 09/05/2009

CLINICAL DATA: States she was getting up to go to the bathroom and
fell, c/o knot on her head . C/o headache Daughter states she hit
her head on the bottom of the dresser.

EXAM:
CT HEAD WITHOUT CONTRAST
CT CERVICAL SPINE WITHOUT CONTRAST
TECHNIQUE: Multidetector CT imaging of the head and cervical spine was
performed following the standard protocol without intravenous
contrast. Multiplanar CT image reconstructions of the cervical spine
were also generated.

[Series 5: head bone · axial · 0.43mm/px · z∈[-79,-25]mm · 2 of 82 slices shown]
[im 28/82  bone]
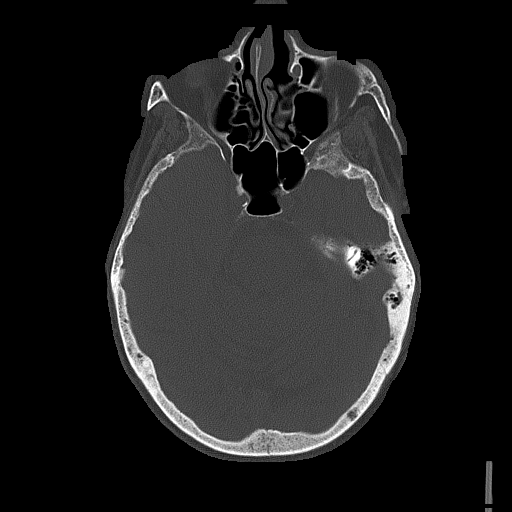
[im 55/82  bone]
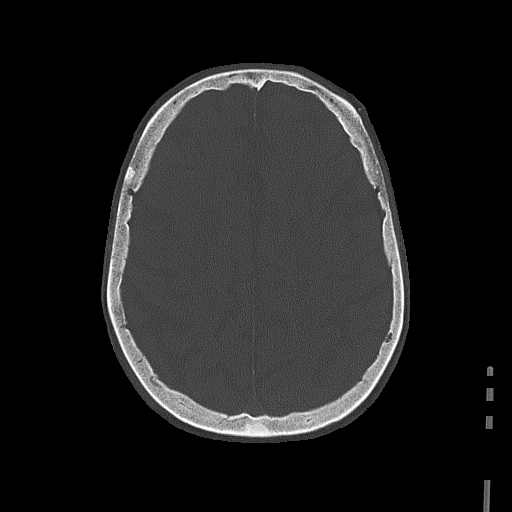

[Series 8: c_spine 2.0 st · axial · 0.29mm/px · z∈[-192,-136]mm · 2 of 84 slices shown, 3 images]
[im 28/84  soft-tissue]
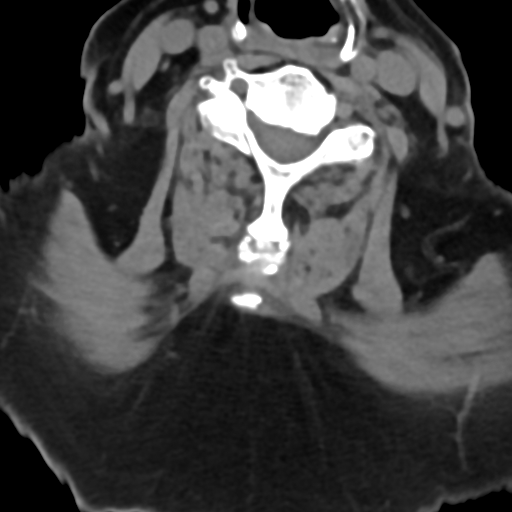
[im 28/84  bone]
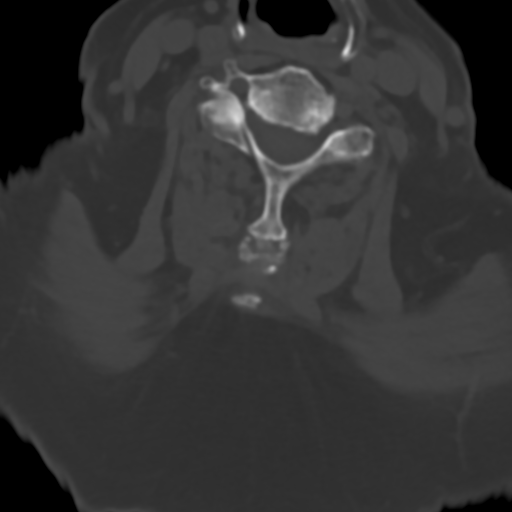
[im 56/84  bone]
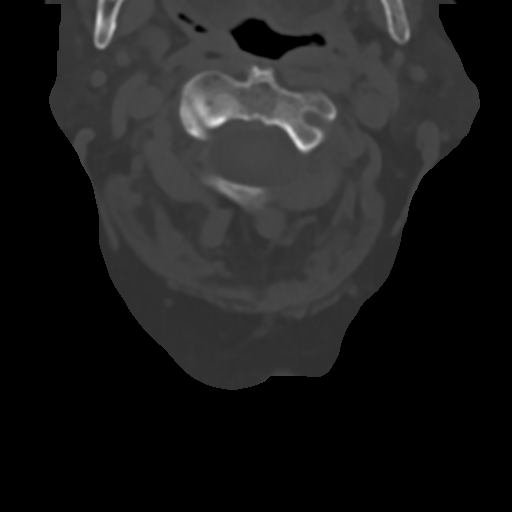

[Series 10: c_spine 2.0 sag bone · sagittal · 0.28mm/px · 5 of 70 slices shown]
[im 12/70  bone]
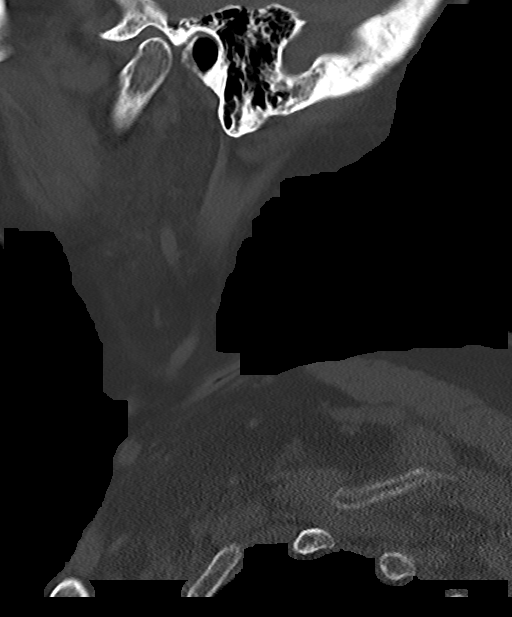
[im 24/70  bone]
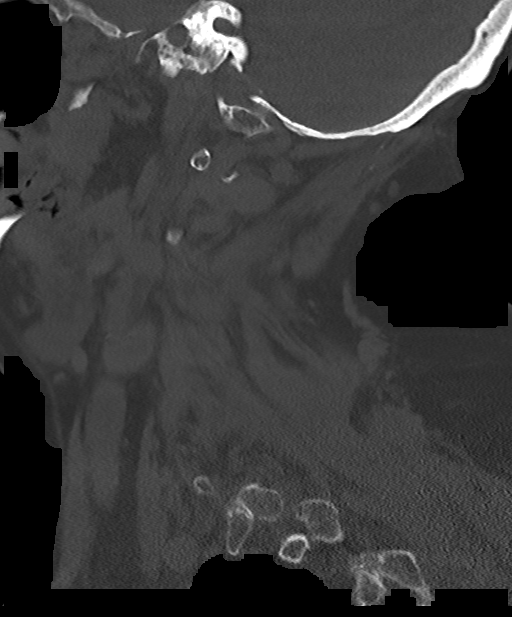
[im 35/70  bone]
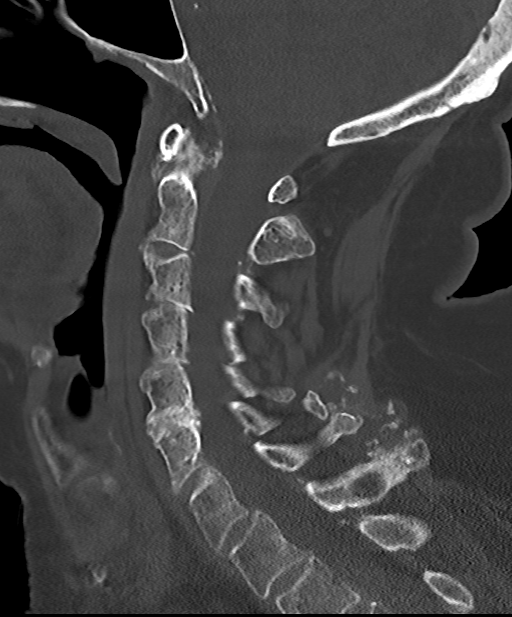
[im 47/70  bone]
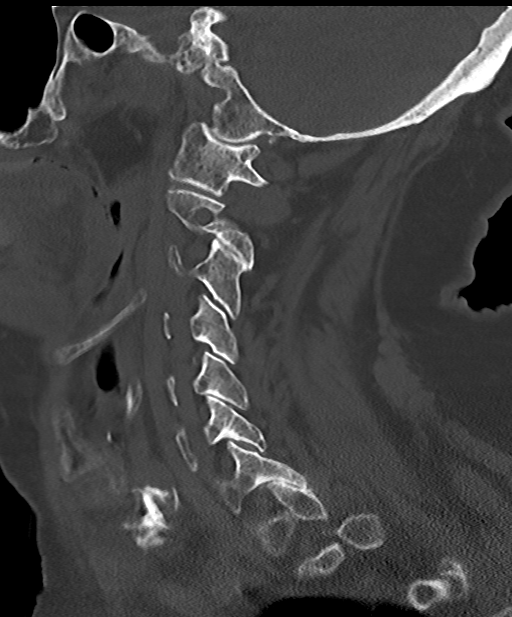
[im 58/70  bone]
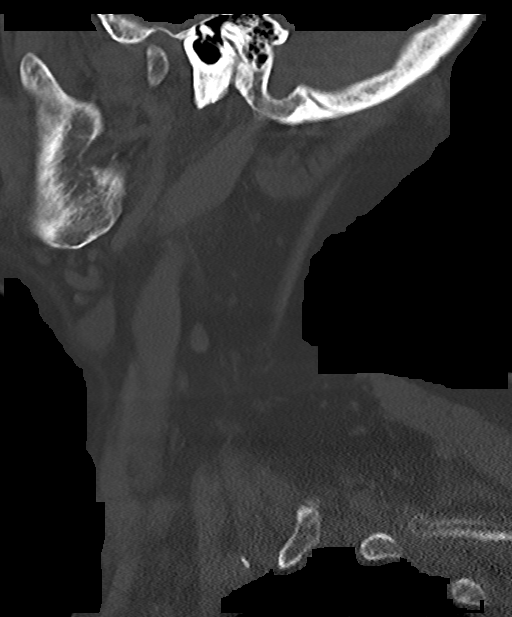

[Series 11: c_spine 2.0 cor bone · coronal · 0.25mm/px · 1 of 61 slices shown]
[im 31/61  bone]
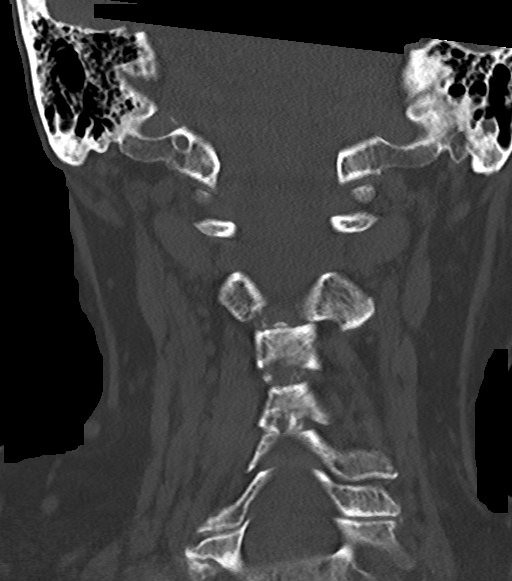

[Series 13: c_spine 2.0 orthogonals · axial · 0.21mm/px · z∈[-214,-149]mm · 2 of 82 slices shown]
[im 28/82  bone]
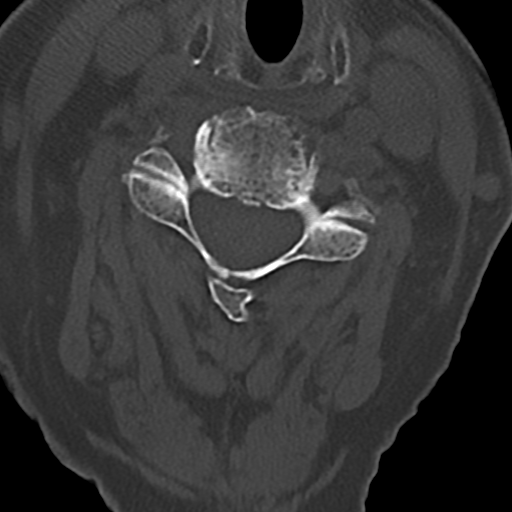
[im 55/82  bone]
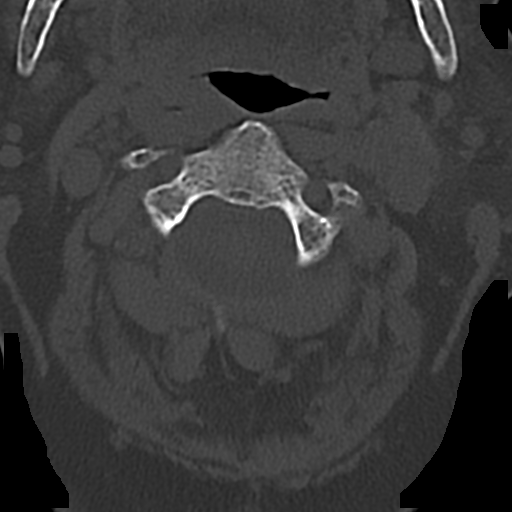

[12 of 33 positions shown; findings below may reference images not displayed]

FINDINGS: CT HEAD FINDINGS

Brain: No evidence of acute infarction, hemorrhage, hydrocephalus,
extra-axial collection or mass lesion/mass effect.

Patchy areas of white matter hypoattenuation are noted consistent
with mild to moderate chronic microvascular ischemic change.

Vascular: No hyperdense vessel or unexpected calcification.

Skull: Normal. Negative for fracture or focal lesion.

Sinuses/Orbits: Globes and orbits are unremarkable. Sinuses and
mastoid air cells are clear.

Other: None.

CT CERVICAL SPINE FINDINGS

Alignment: Normal.

Skull base and vertebrae: No acute fracture. No primary bone lesion
or focal pathologic process.

Soft tissues and spinal canal: No prevertebral fluid or swelling. No
visible canal hematoma.

No soft tissue masses or adenopathy.

Disc levels: Moderate loss of disc height with mild spondylotic disc
bulging and endplate spurring at C5-C6. Remaining cervical discs are
well preserved in height. No convincing disc herniation.

Upper chest: No acute findings.  Clear lung apices.

Other: None.
IMPRESSION: HEAD CT

1. No acute intracranial abnormalities.
2. No skull fractures.

CERVICAL CT

1. No fracture or acute finding.

## 2020-11-24 IMAGING — DX PORTABLE CHEST - 1 VIEW
1 series · 1 of 1 positions shown · non-contrast
Comparison: 02/17/2015

CLINICAL DATA: States she was getting up to go to the bathroom and
fell, c/o knot on her head . States she twisted her left leg. C/o
headache Daughter states she hit her head on the bottom of the
dresser. Happened approx. 214am

EXAM:
PORTABLE CHEST 1 VIEW

[chest ap]
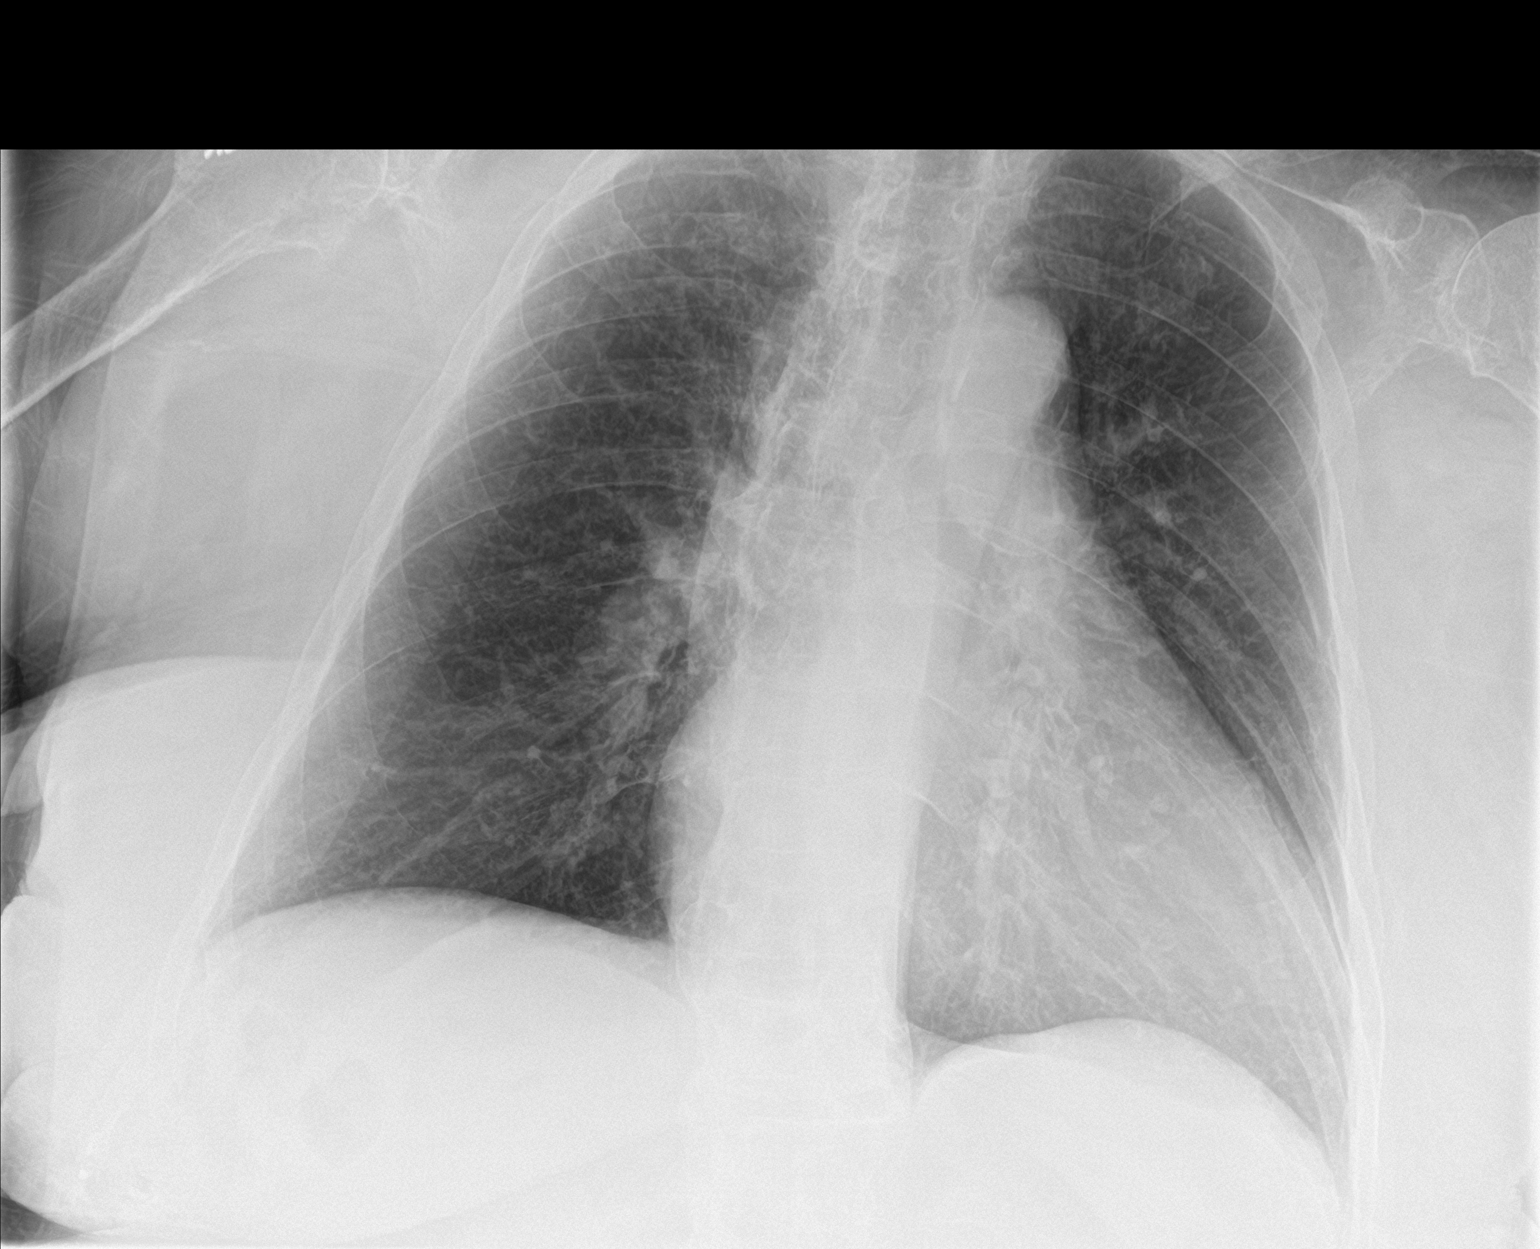

[1 of 1 positions shown; findings below may reference images not displayed]

FINDINGS: Cardiac silhouette is mildly enlarged. No mediastinal or hilar
masses. No evidence of adenopathy.

Clear lungs.  No pleural effusion or pneumothorax.

Skeletal structures are demineralized. Old healed left lateral rib
fractures. No convincing acute fracture.
IMPRESSION: No acute cardiopulmonary disease.

## 2020-11-24 IMAGING — CT CT ABDOMEN AND PELVIS WITH CONTRAST
2 of 5 series · 17 of 46 positions shown, 19 images · IV contrast (Omni 300)
Comparison: None.

CLINICAL DATA: Pt has dementia and can't give any history about
pain she was here initially for a fall and hitting her head.

EXAM:
CT ABDOMEN AND PELVIS WITH CONTRAST
TECHNIQUE: Multidetector CT imaging of the abdomen and pelvis was performed
using the standard protocol following bolus administration of
intravenous contrast.
CONTRAST:  100mL OMNIPAQUE IOHEXOL 300 MG/ML  SOLN

[Series 3: a/p w/ 5mm · axial · 0.76mm/px · z∈[+860,+1184]mm · 14 of 75 slices shown, 16 images]
[im 5/75  soft-tissue]
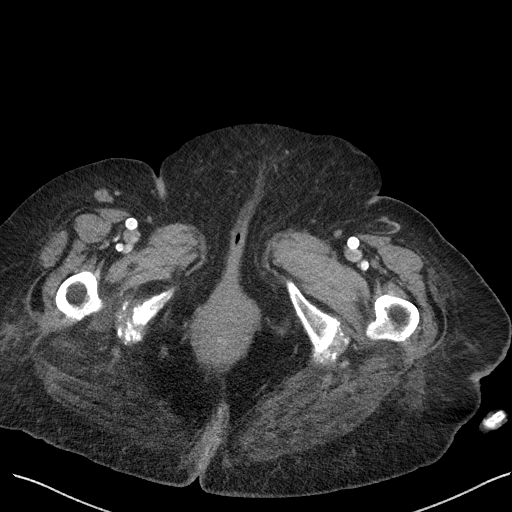
[im 5/75  bone]
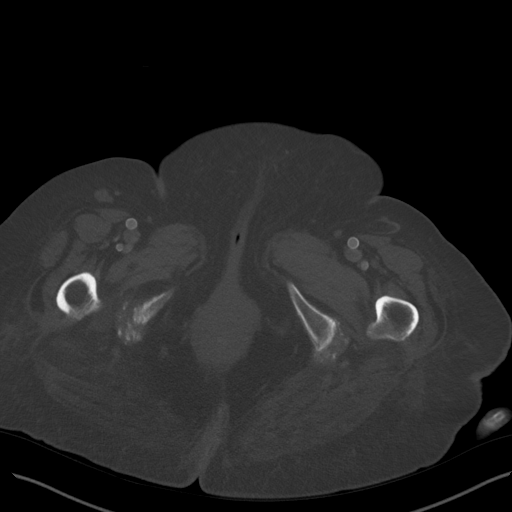
[im 9/75  soft-tissue]
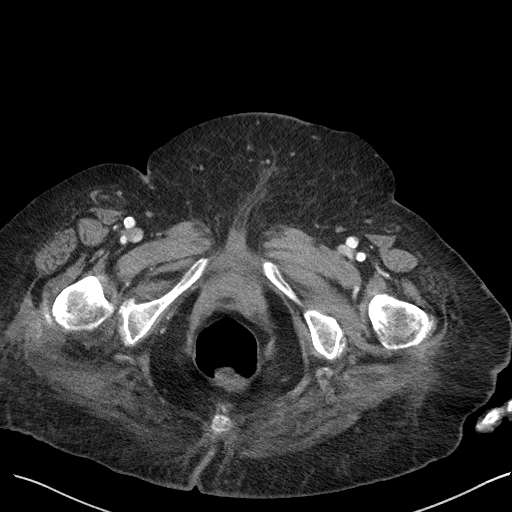
[im 17/75  soft-tissue]
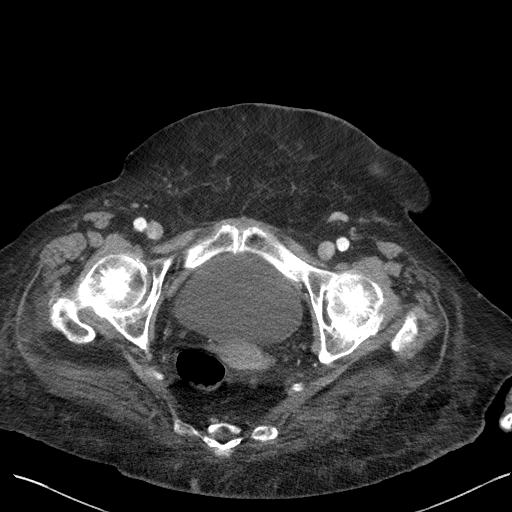
[im 21/75  soft-tissue]
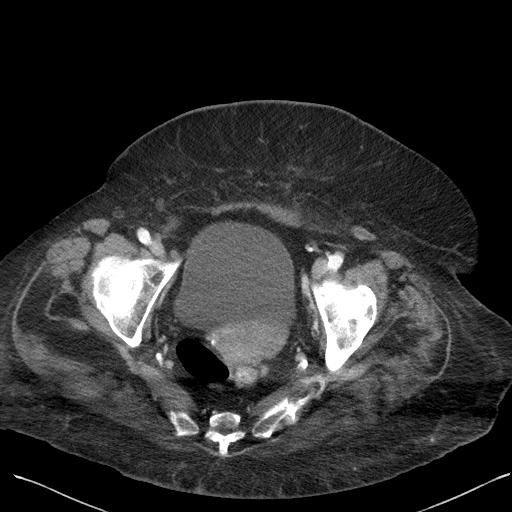
[im 25/75  soft-tissue]
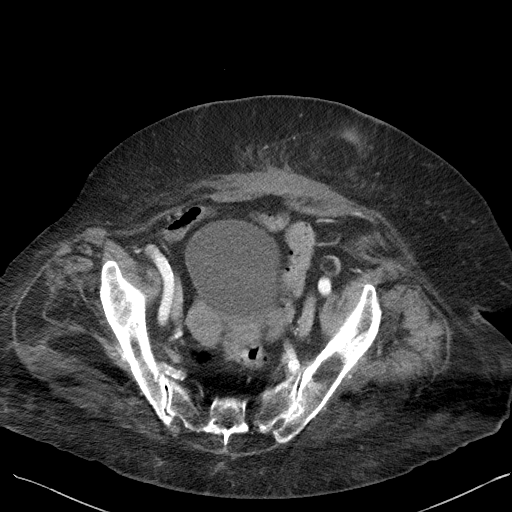
[im 29/75  soft-tissue]
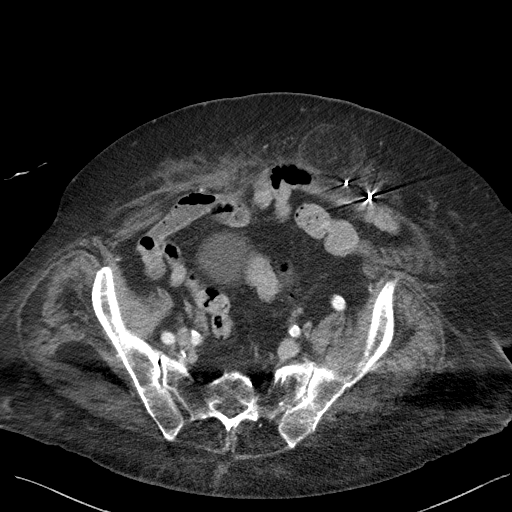
[im 33/75  soft-tissue]
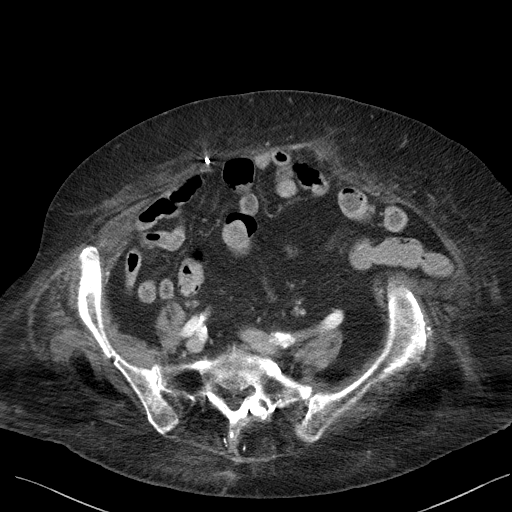
[im 42/75  soft-tissue]
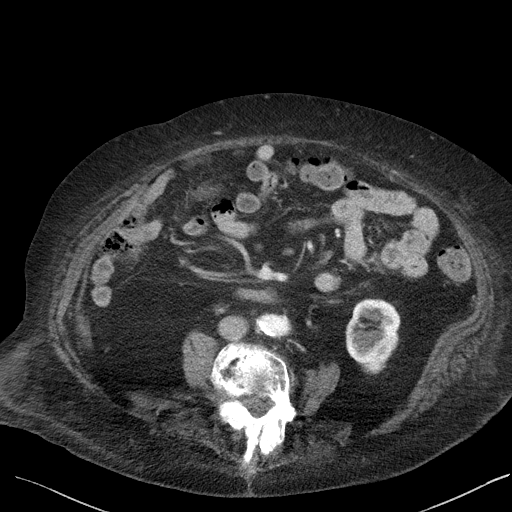
[im 46/75  soft-tissue]
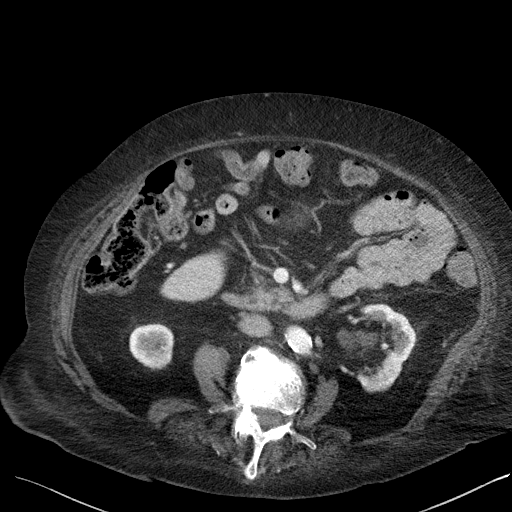
[im 46/75  bone]
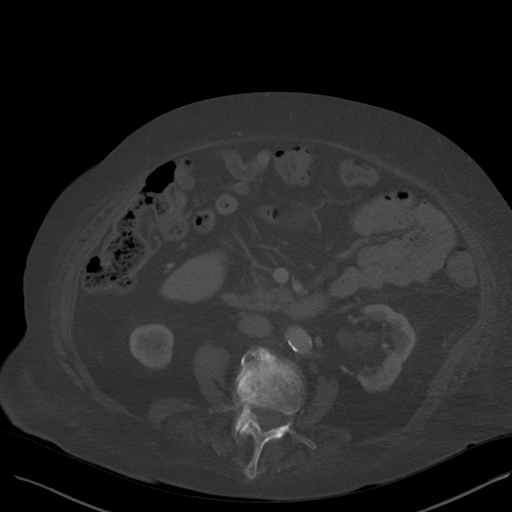
[im 50/75  soft-tissue]
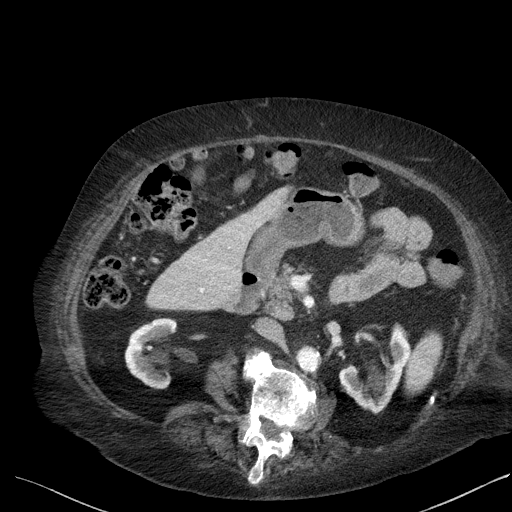
[im 54/75  soft-tissue]
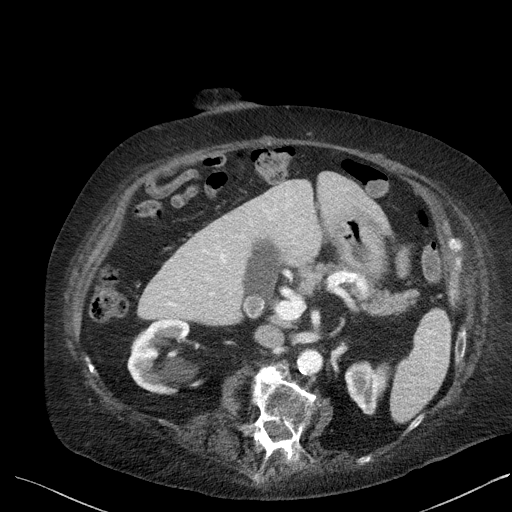
[im 58/75  soft-tissue]
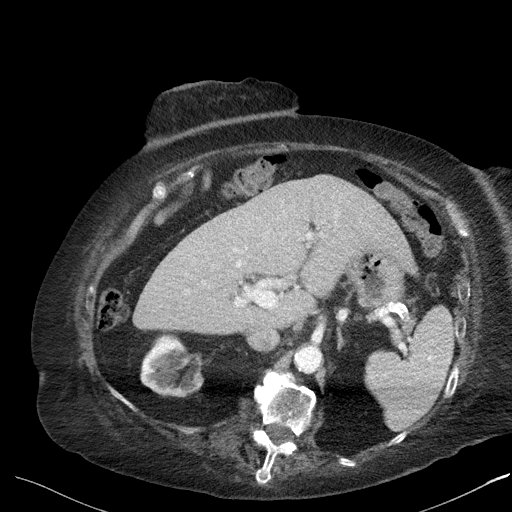
[im 66/75  soft-tissue]
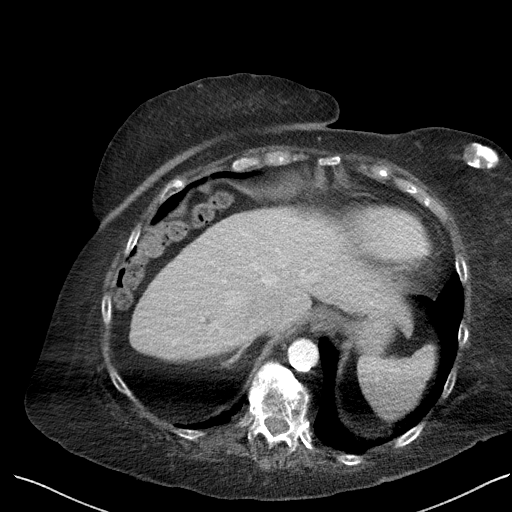
[im 70/75  soft-tissue]
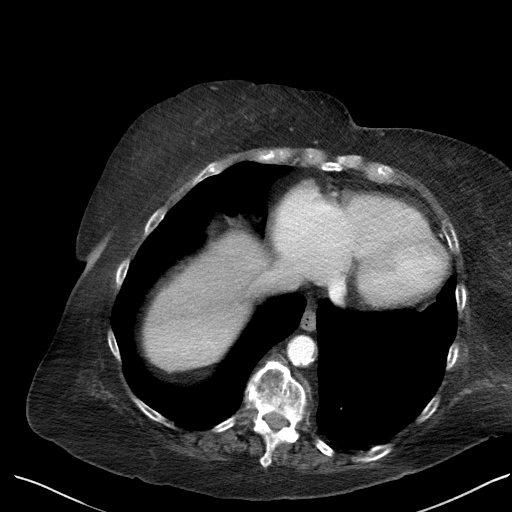

[Series 6: a/p w/ cor · coronal · 0.73mm/px · 3 of 150 slices shown]
[im 50/150  soft-tissue]
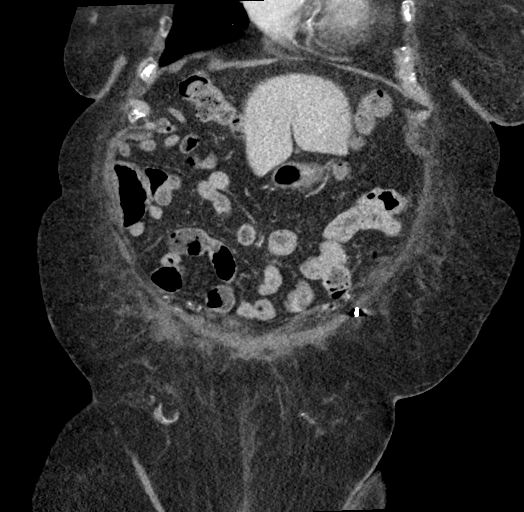
[im 67/150  soft-tissue]
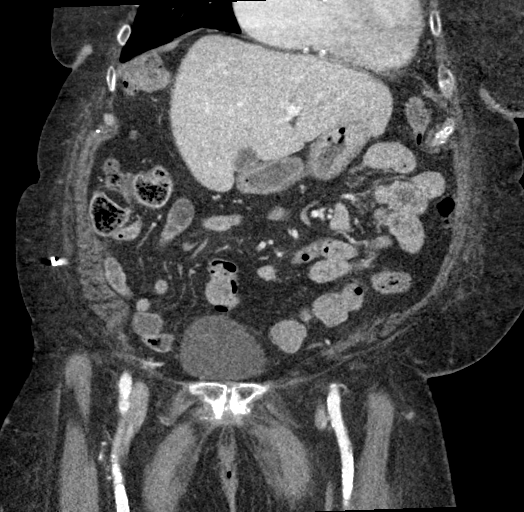
[im 83/150  soft-tissue]
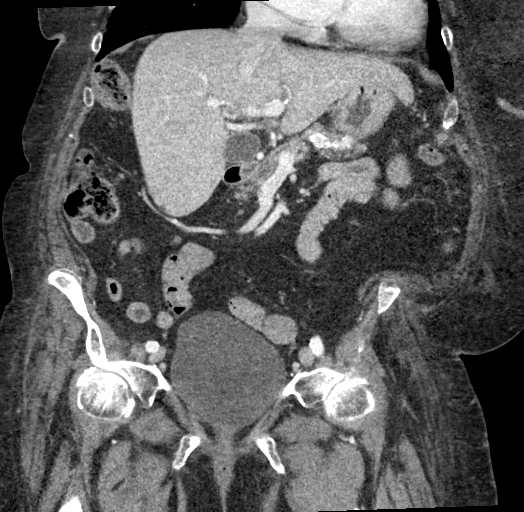

[17 of 46 positions shown; findings below may reference images not displayed]

FINDINGS: Lower chest: No acute abnormality.

Hepatobiliary: Liver normal size. 4 mm low-density lesion in the
right lobe near the dome consistent with a cyst. No other liver
masses or lesions. 1.7 cm gallstone. Gallbladder otherwise
unremarkable. No bile duct dilation.

Pancreas: Unremarkable. No pancreatic ductal dilatation or
surrounding inflammatory changes.

Spleen: Normal in size without focal abnormality.

Adrenals/Urinary Tract: No adrenal masses.

Bilateral renal cortical thinning. 7 mm posterior midpole left renal
cyst. Right renal sinus cysts. No stones. No hydronephrosis. Normal
ureters. Bladder is unremarkable.

Stomach/Bowel: Stomach is within normal limits. Appendix appears
normal. No evidence of bowel wall thickening, distention, or
inflammatory changes.

Vascular/Lymphatic: Aortic atherosclerosis. No enlarged abdominal or
pelvic lymph nodes.

Reproductive: Uterus and bilateral adnexa are unremarkable.

Other: Small supraumbilical fat containing abdominal hernia.
Postsurgical changes along the anterior abdominal wall. No ascites.

Musculoskeletal: No fracture or acute finding. Degenerative changes
throughout the visualized spine. No osteoblastic or osteolytic
lesions.
IMPRESSION: 1. No acute findings within the abdomen or pelvis.
2. Gallstone.  No acute cholecystitis.
3. Aortic atherosclerosis.
4. Bilateral renal cortical thinning. Right renal sinus left renal
cortical cyst.

## 2020-11-24 IMAGING — DX LEFT TIBIA AND FIBULA - 2 VIEW
4 series · 4 of 4 positions shown · non-contrast
Comparison: None.

CLINICAL DATA: States she was getting up to go to the bathroom and
fell, c/o knot on her head . States she twisted her left leg. C/o
headache Daughter states she hit her head on the bottom of the
dresser. Happened approx. 288am

EXAM:
LEFT TIBIA AND FIBULA - 2 VIEW

[tibia ap (1 of 2)]
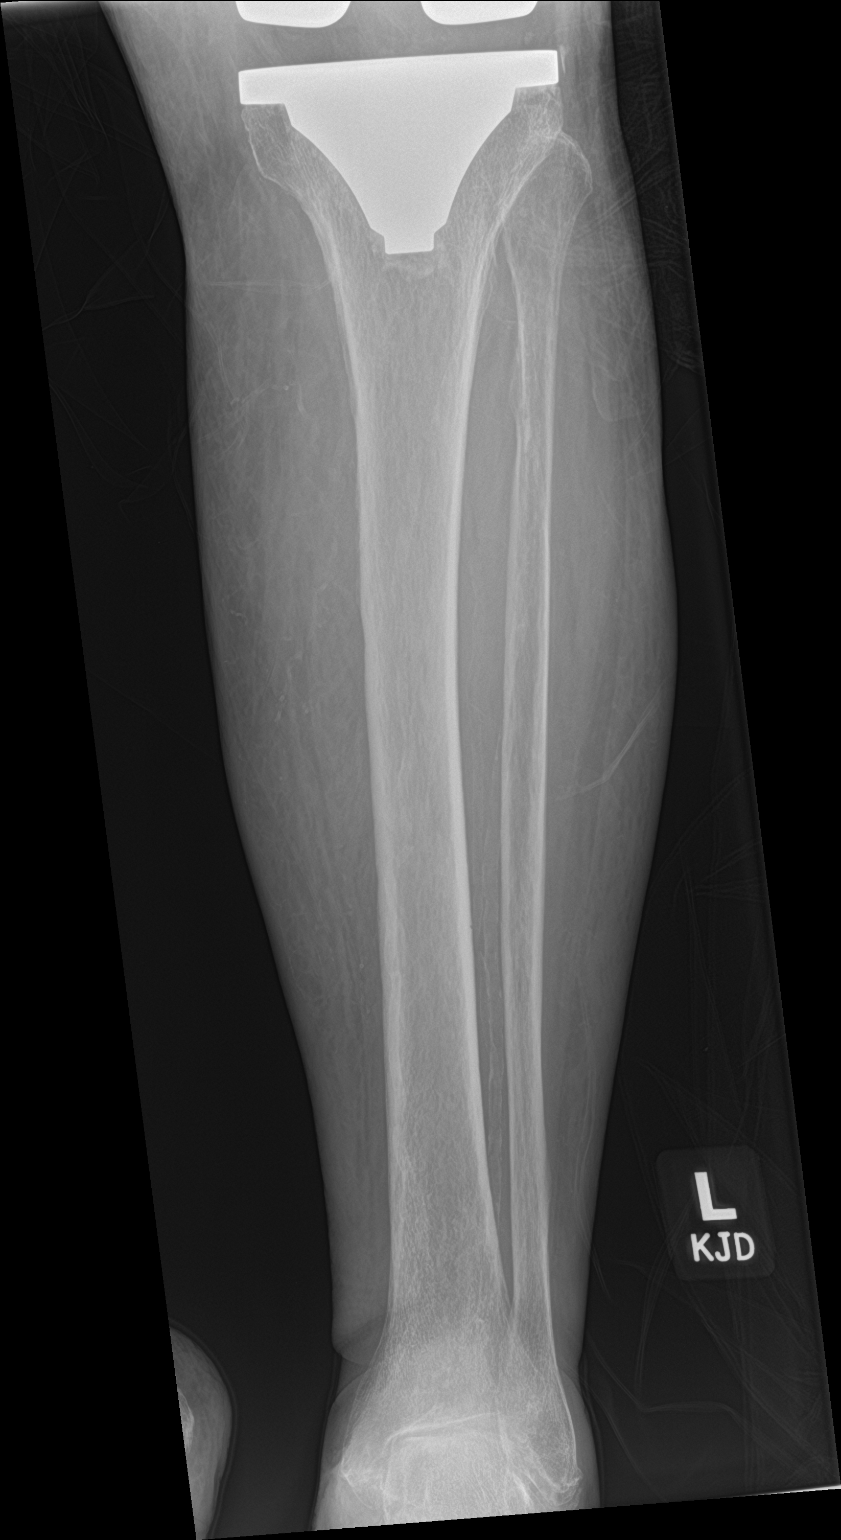

[tibia ap (2 of 2)]
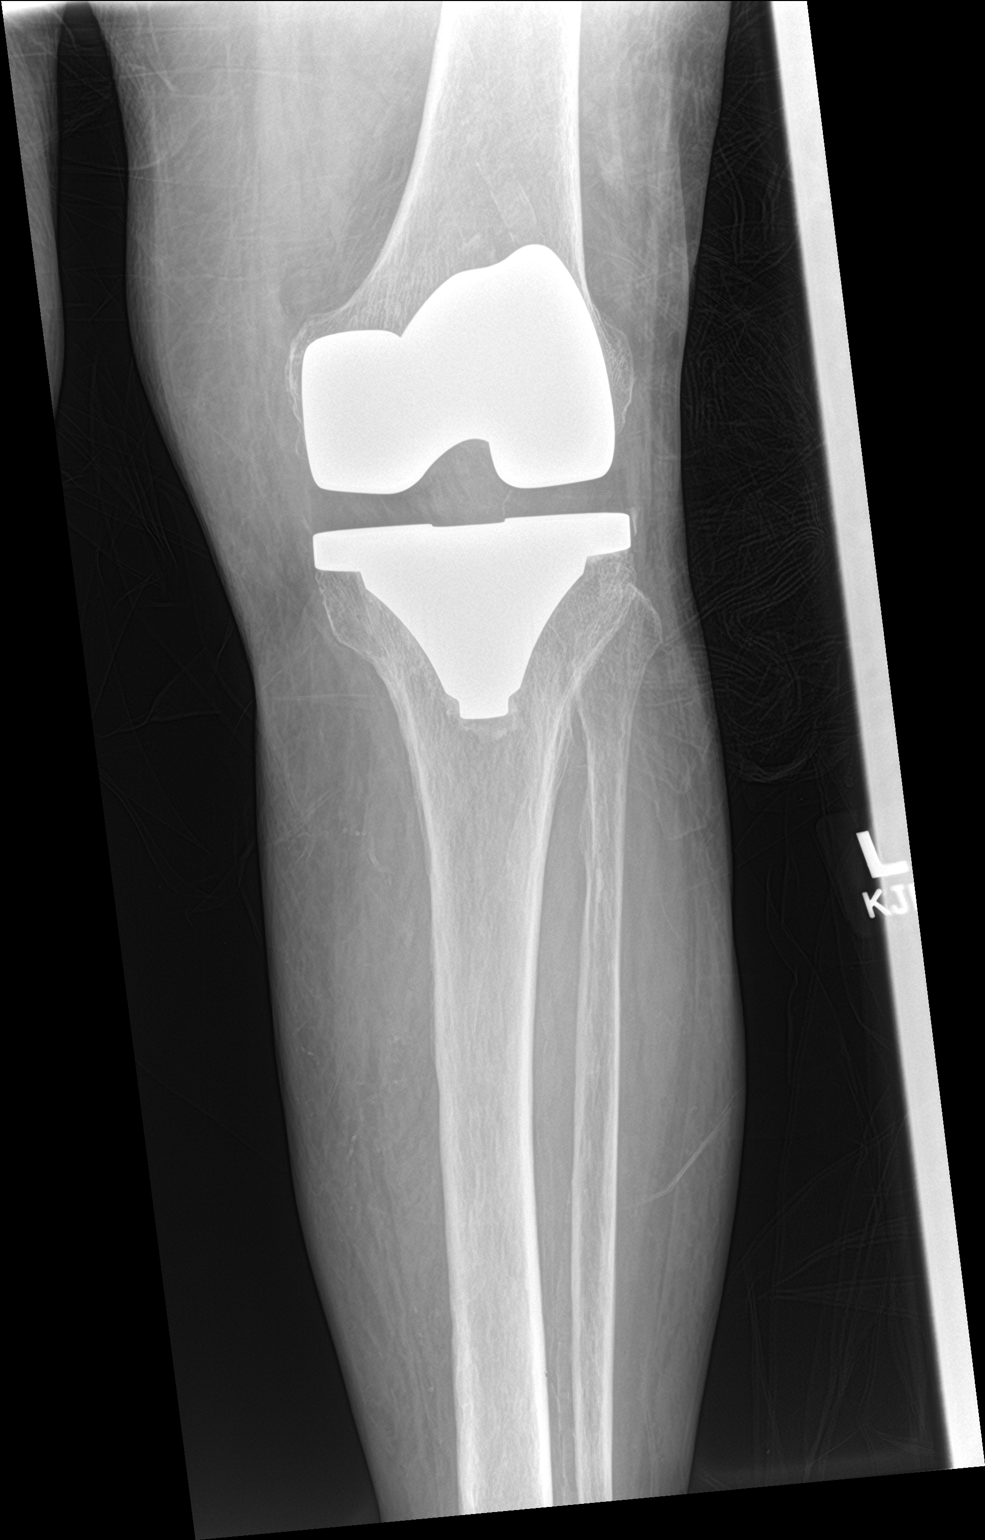

[tibia lat (1 of 2)]
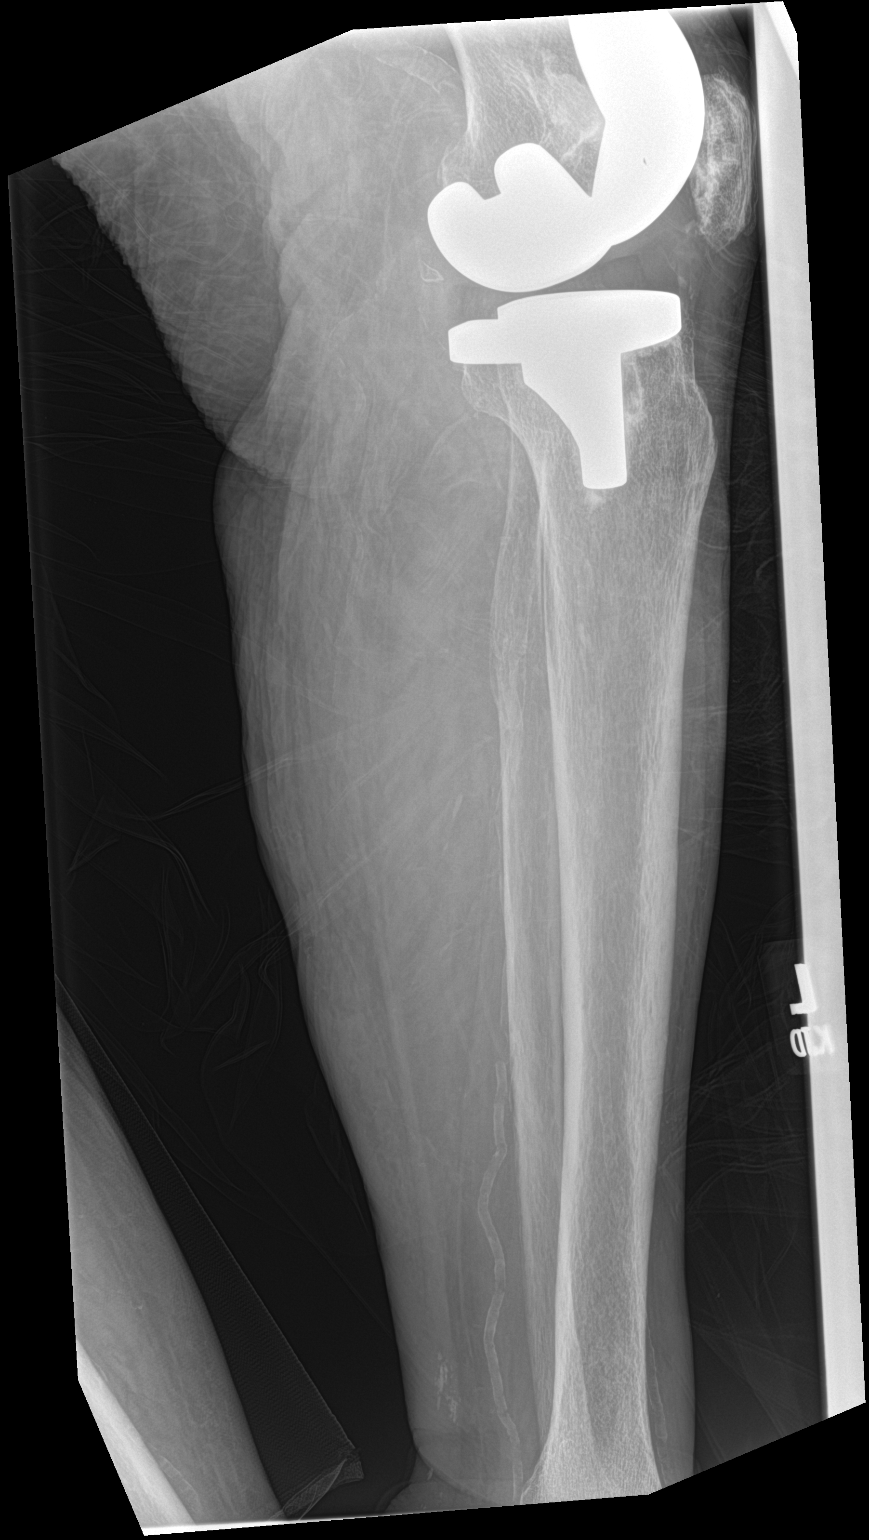

[tibia lat (2 of 2)]
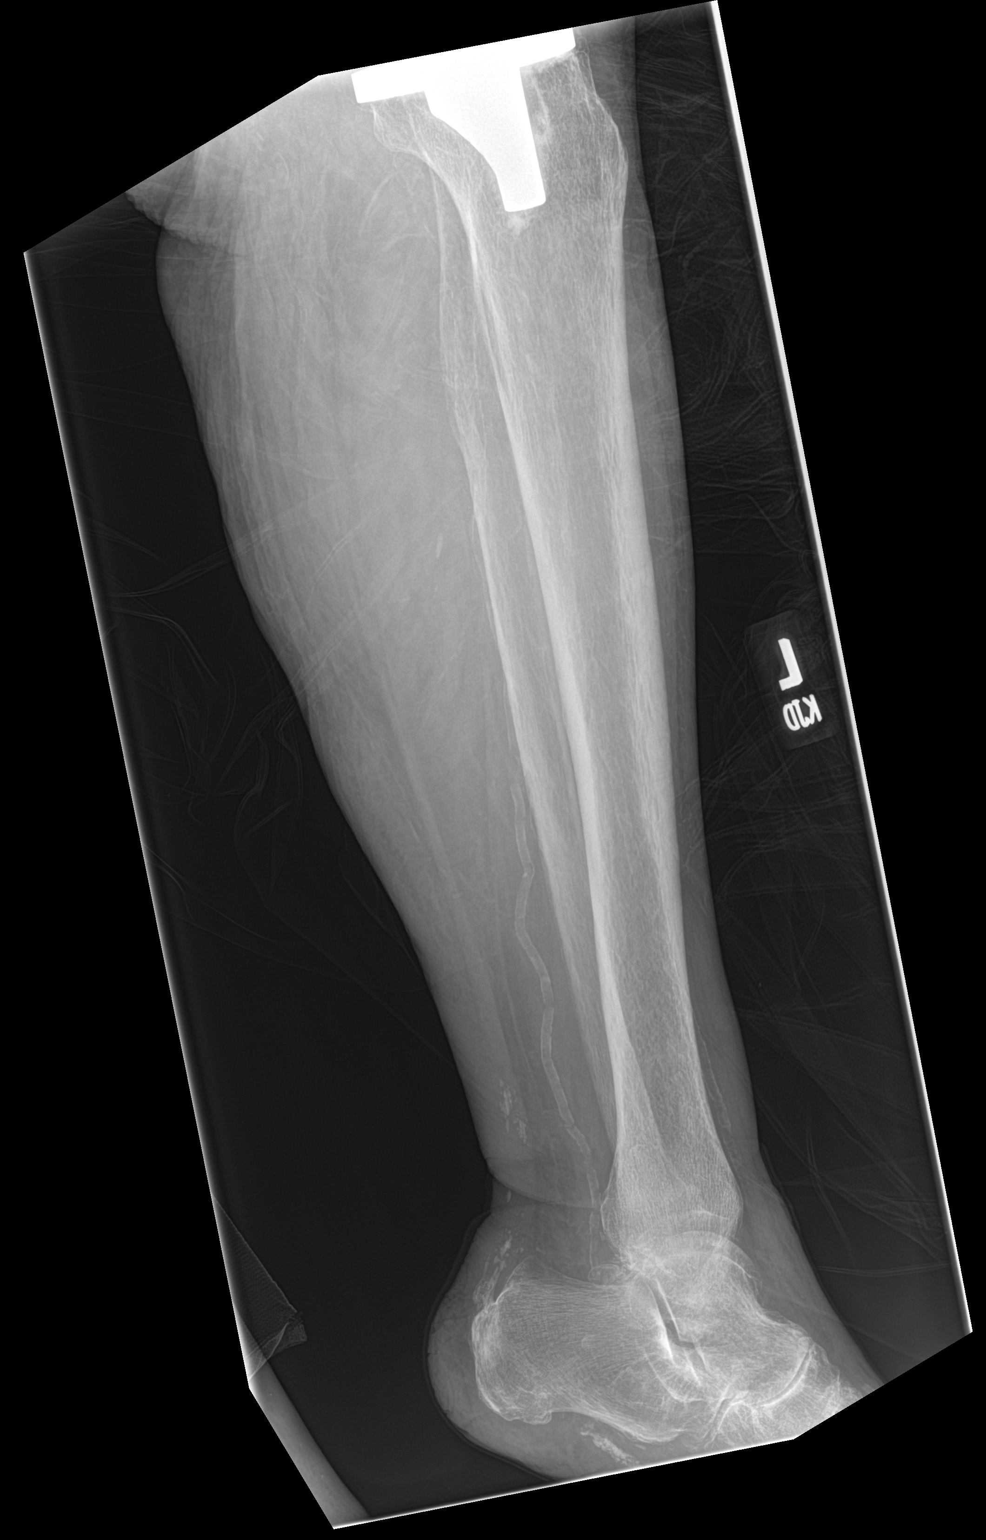

[4 of 4 positions shown; findings below may reference images not displayed]

FINDINGS: No fracture or bone lesion.

Knee prosthetic components appear well seated and well aligned.

Ankle joint is normally aligned.

Bones are demineralized.

Soft tissues are unremarkable other than arterial vascular
calcifications.
IMPRESSION: No fracture, dislocation or acute finding. No evidence of disruption
of the knee joint prosthetic hardware.

## 2021-02-05 IMAGING — MG MM DIGITAL SCREENING BILAT W/ TOMO W/ CAD
8 series · 8 of 24 positions shown · non-contrast
Comparison: Previous exam(s).

CLINICAL DATA: Screening.

EXAM:
DIGITAL SCREENING BILATERAL MAMMOGRAM WITH TOMO AND CAD

[L CC synth-2D]
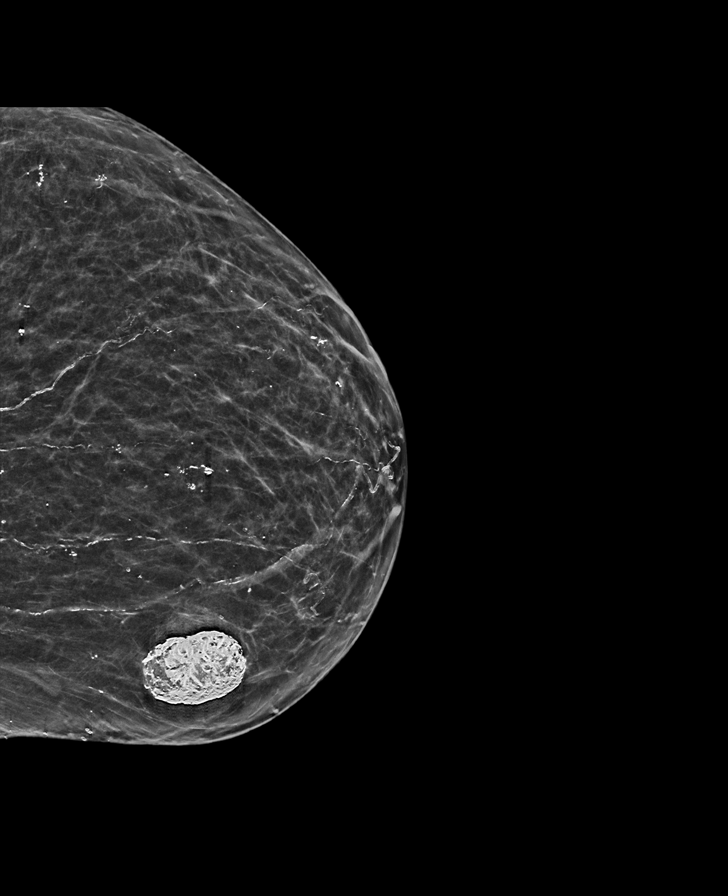

[R CC synth-2D]
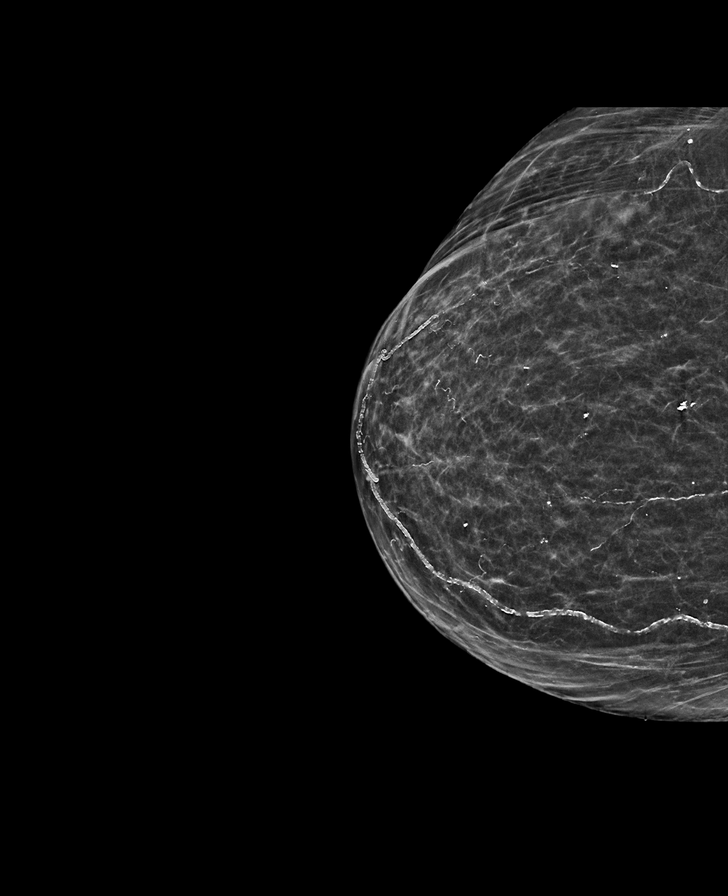

[L MLO synth-2D]
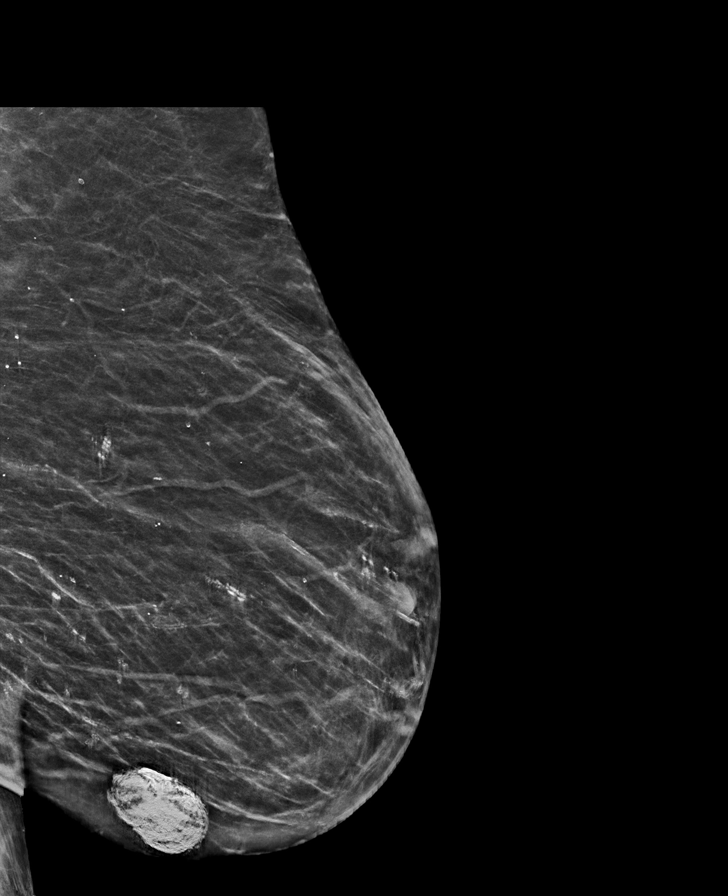

[R MLO synth-2D]
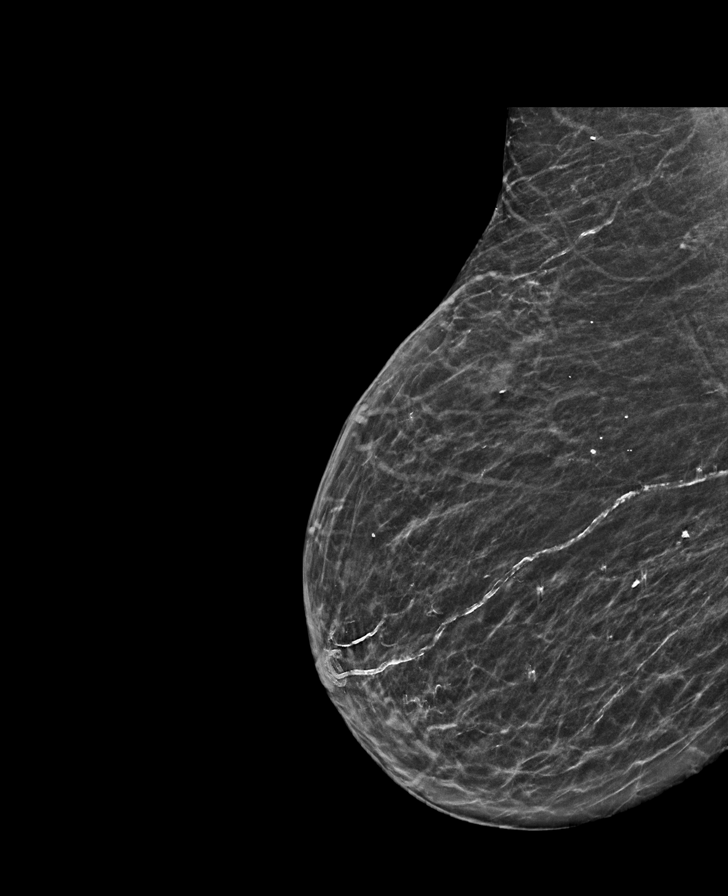

[R CC tomo · tomo slice 26/51.0]
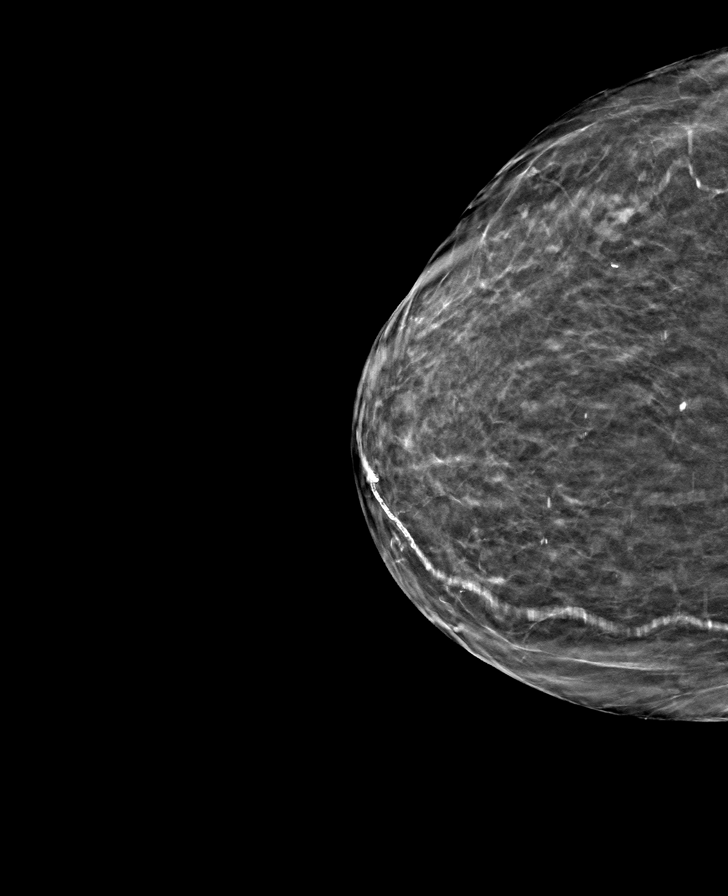

[R MLO tomo · tomo slice 29/57.0]
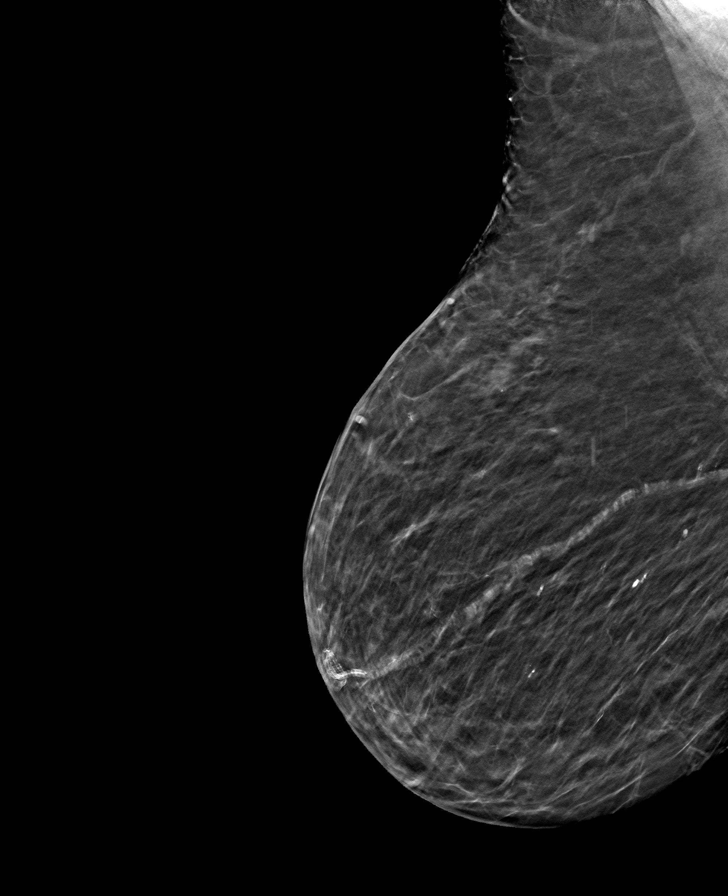

[L CC tomo · tomo slice 31/61.0]
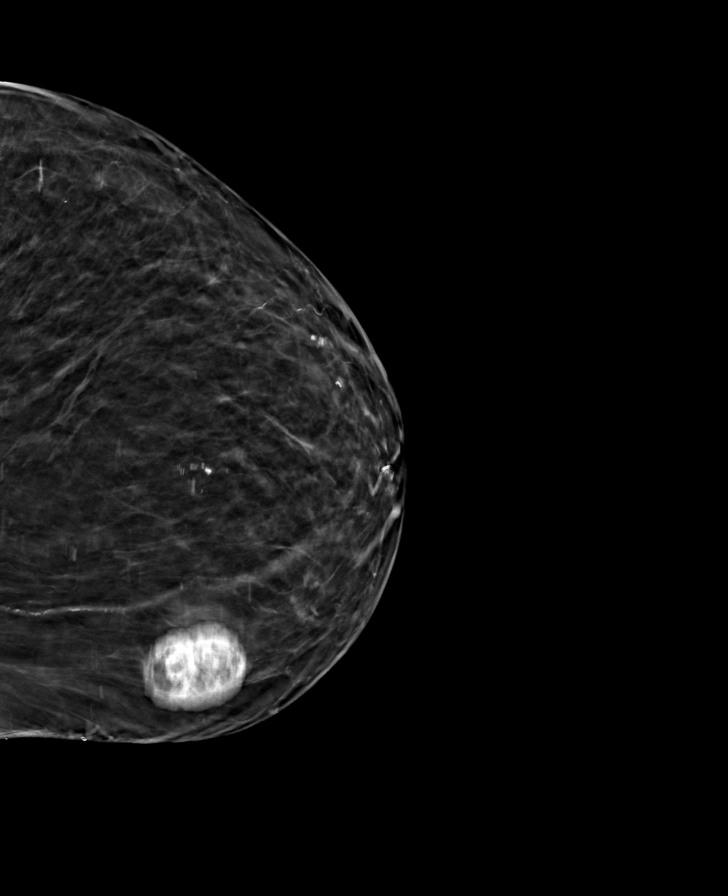

[L MLO tomo · tomo slice 33/66.0]
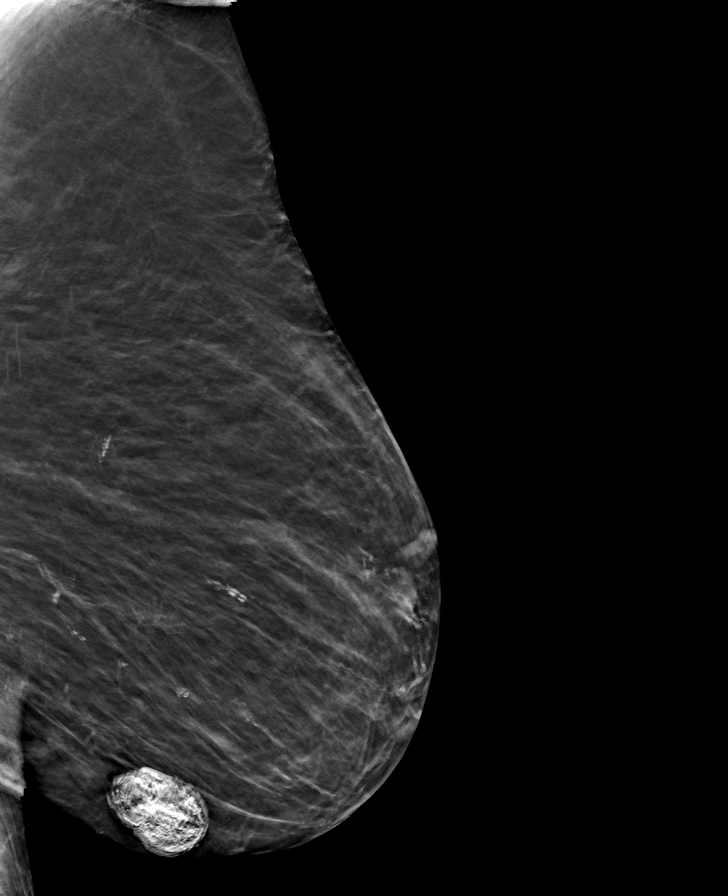

[8 of 24 positions shown; findings below may reference images not displayed]

ACR Breast Density Category b: There are scattered areas of
fibroglandular density.
FINDINGS: There are no findings suspicious for malignancy. Images were
processed with CAD.
IMPRESSION: No mammographic evidence of malignancy. A result letter of this
screening mammogram will be mailed directly to the patient.

RECOMMENDATION:
Screening mammogram in one year. (Code:[id number])

BI-RADS CATEGORY  1: Negative.

## 2021-04-02 IMAGING — CT CT HEART MORP W/ CTA COR W/ SCORE W/ CA W/CM &/OR W/O CM
1 of 7 series · 1 of 20 positions shown · non-contrast
Comparison: 02/17/2015 chest CT angiogram.
COMPARISON: 02/17/2015 chest CT angiogram.

Addendum:
EXAM:
OVER-READ INTERPRETATION  CT CHEST

The following report is an over-read performed by radiologist Dr.
Niesha League [REDACTED] on 11/16/2019. This over-read
does not include interpretation of cardiac or coronary anatomy or
pathology. The CTA interpretation by the cardiologist is attached.
CLINICAL DATA: 86-year-old female with severe aortic stenosis being
evaluated for a TAVR procedure.
Cardiac TAVR CT
TECHNIQUE: The patient was scanned on a Phillips Force scanner. A 120 kV
retrospective scan was triggered in the descending thoracic aorta at
111 HU's. Gantry rotation speed was 250 msecs and collimation was .6
mm. No beta blockade or nitro were given. The 3D data set was
reconstructed in 5% intervals of the R-R cycle. Systolic and
diastolic phases were analyzed on a dedicated work station using
MPR, MIP and VRT modes. The patient received 80 cc of contrast.

[Series 690: — · 0.45mm/px · 1 of 7 slices shown]
[im 4/7]
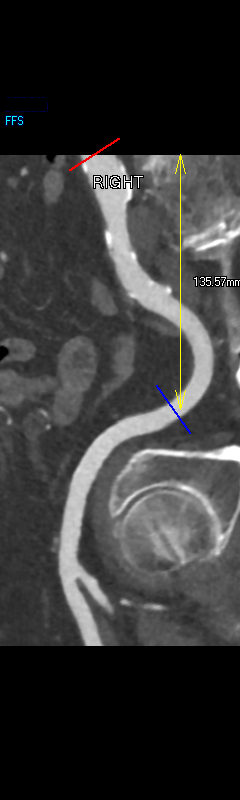

[1 of 20 positions shown; findings below may reference images not displayed]

FINDINGS: Please see the separate concurrent chest CT angiogram report for
details.
IMPRESSION: Please see the separate concurrent chest CT angiogram report for
details.
FINDINGS: Aortic Valve: Trileaflet aortic valve with moderately thickened and
calcified leaflets and no calcifications extending into the LVOT.

Aorta: Normal size with mild diffuse atherosclerotic plaque and no
dissection.

Sinotubular Junction: 27 x 26 mm

Ascending Thoracic Aorta: 32 x 31 mm

Aortic Arch: 25 x 24 mm

Descending Thoracic Aorta: 22 x 22 mm

Sinus of Valsalva Measurements:

Non-coronary: 30 mm

Right -coronary: 30 mm

Left -coronary: 32 mm

Coronary Artery Height above Annulus:

Left Main: 11 mm

Right Coronary: 17 mm

Virtual Basal Annulus Measurements:

Maximum/Minimum Diameter: 26.4 x 20.7 mm

Mean Diameter: 23.4 mm

Perimeter: 75.5 mm

Area: 432 mm2

Optimum Fluoroscopic Angle for Delivery: LAO 35 SARANGO 23
IMPRESSION: 1. Trileaflet aortic valve with moderately thickened and calcified
leaflets and no calcifications extending into the LVOT. Aortic valve
calcium score 2122 consistent with severe aortic stenosis. Annular
measurements are borderline between 23 and 26 mm Edwards-SAPIEN 3
Ultra valve.

2. Sufficient coronary to annulus distance.

3. Optimum Fluoroscopic Angle for Delivery: LAO 35 SARANGO 23

4. No thrombus in the left atrial appendage.

5. Dilated pulmonary artery measuring 34 mm.

*** End of Addendum ***
EXAM:
OVER-READ INTERPRETATION  CT CHEST

The following report is an over-read performed by radiologist Dr.
Niesha League [REDACTED] on 11/16/2019. This over-read
does not include interpretation of cardiac or coronary anatomy or
pathology. The CTA interpretation by the cardiologist is attached.
FINDINGS: Please see the separate concurrent chest CT angiogram report for
details.
IMPRESSION: Please see the separate concurrent chest CT angiogram report for
details.

## 2021-04-02 IMAGING — CT CT ANGIO CHEST
2 of 17 series · 14 of 37 positions shown · IV contrast (APPLIED)
Comparison: 07/10/2019 CT abdomen/pelvis. 02/17/2015 chest CT
angiogram.

CLINICAL DATA: Severe symptomatic aortic stenosis. Pre-TAVR
evaluation.

EXAM:
CT ANGIOGRAPHY CHEST
CTA ABDOMEN AND PELVIS WITH CONTRAST
TECHNIQUE: Multidetector CT imaging through the chest was performed using the
standard protocol during bolus administration of intravenous
contrast. Multiplanar reconstructed images and MIPs were obtained
and reviewed to evaluate the vascular anatomy.

[Series 9: 0-90% · axial · 0.39mm/px · z∈[+1130,+1288]mm · 7 of 3500 slices shown]
[im 438/3500  lung]
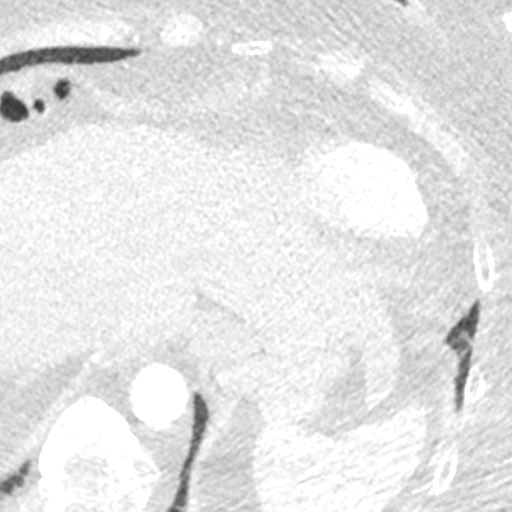
[im 875/3500  mediastinal]
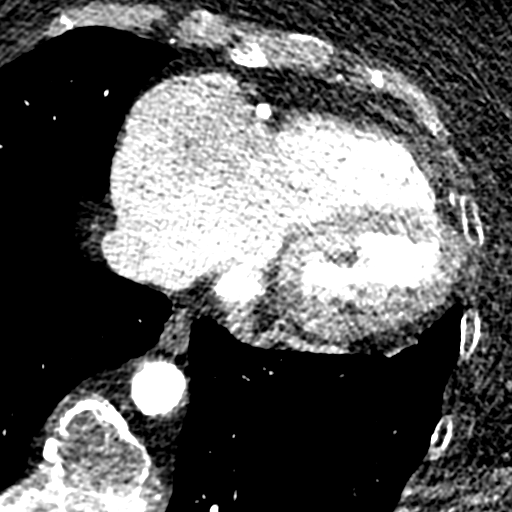
[im 1313/3500  lung]
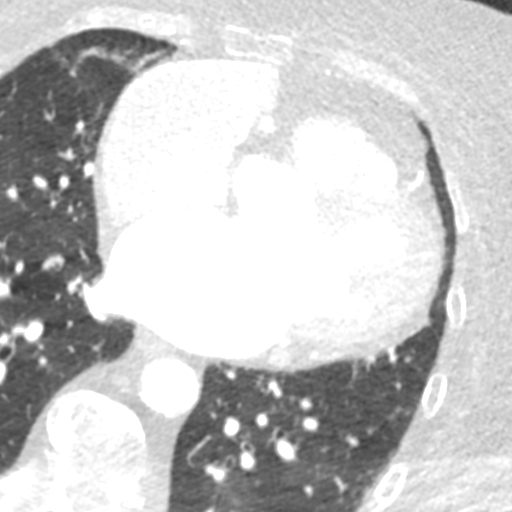
[im 1750/3500  mediastinal]
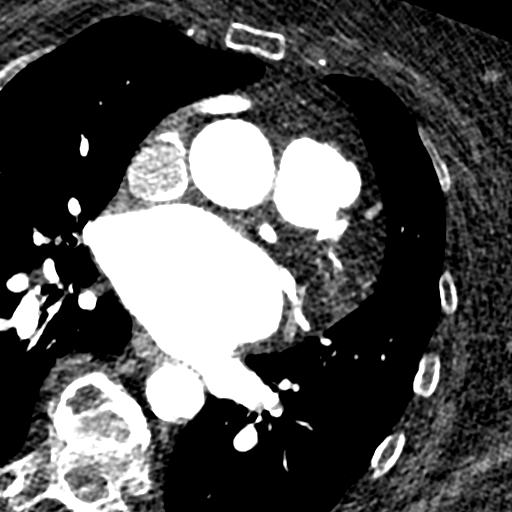
[im 2187/3500  lung]
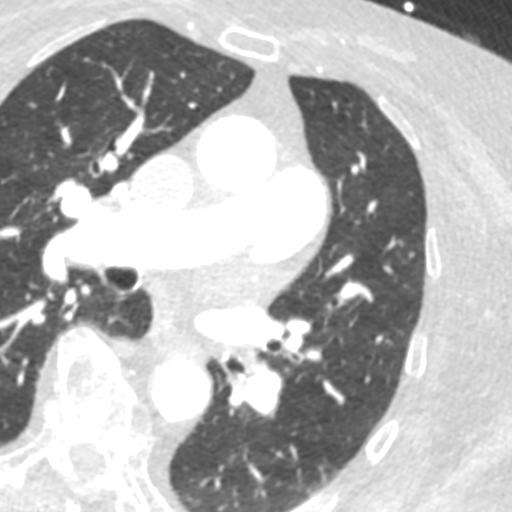
[im 2625/3500  mediastinal]
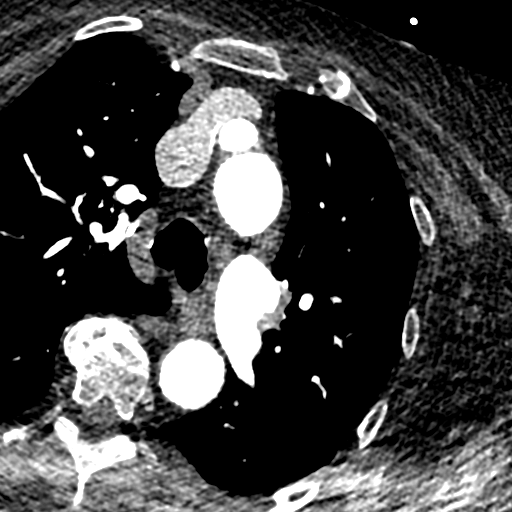
[im 3062/3500  lung]
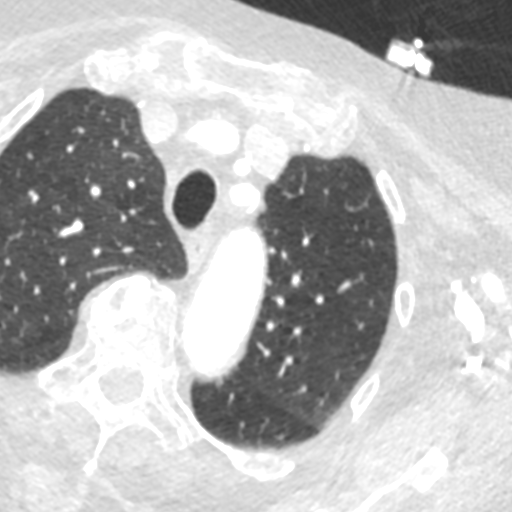

[Series 10: 5-95% · axial · 0.39mm/px · z∈[+1130,+1288]mm · 7 of 3500 slices shown]
[im 438/3500  lung]
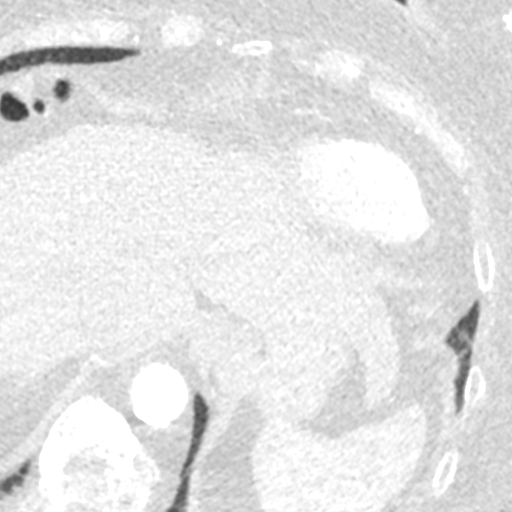
[im 875/3500  lung]
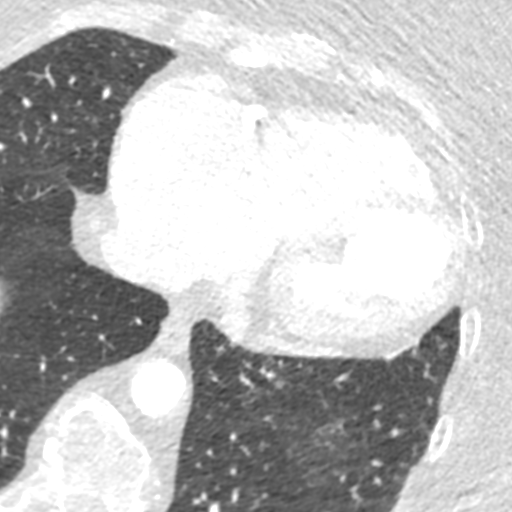
[im 1313/3500  lung]
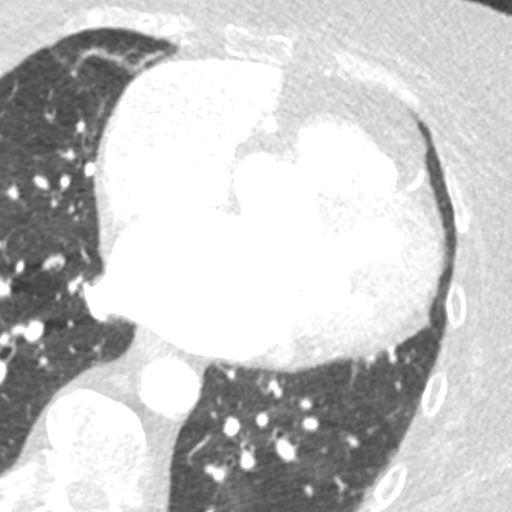
[im 1750/3500  lung]
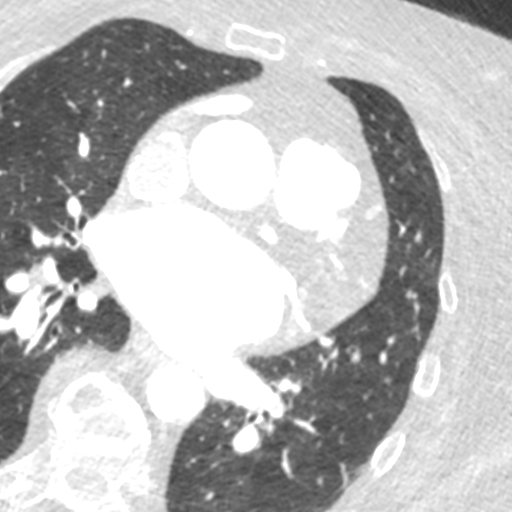
[im 2187/3500  lung]
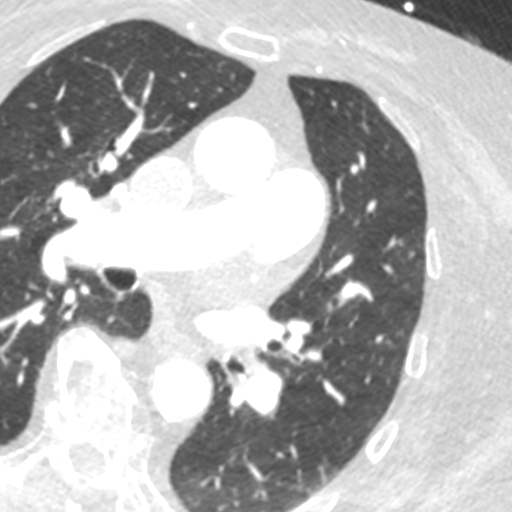
[im 2625/3500  lung]
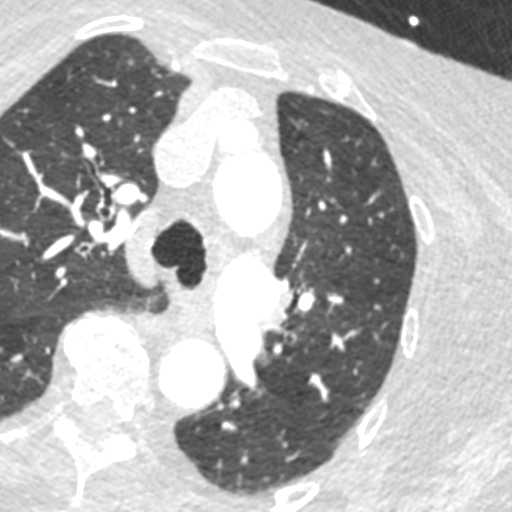
[im 3062/3500  lung]
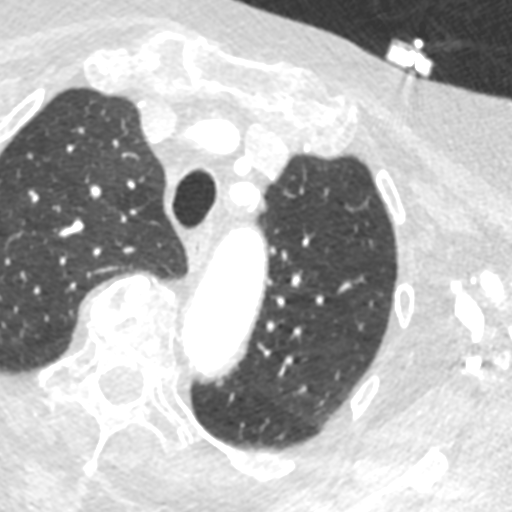

[14 of 37 positions shown; findings below may reference images not displayed]

Multidetector CT imaging of the abdomen and pelvis was performed
using the standard protocol before and during bolus administration
of intravenous contrast. Multiplanar reconstructed images and MIPs
were obtained and reviewed to evaluate the vascular anatomy.

CONTRAST:  100mL OMNIPAQUE IOHEXOL 350 MG/ML SOLN
FINDINGS: CTA CHEST FINDINGS

Cardiovascular: Moderate cardiomegaly. No significant pericardial
effusion/thickening. Diffuse thickening and calcification of the
aortic valve. Three-vessel coronary atherosclerosis. Atherosclerotic
nonaneurysmal thoracic aorta. Top-normal caliber main pulmonary
artery (3.3 cm diameter). No central pulmonary emboli.

Mediastinum/Nodes: Dominant calcified 1.2 cm left thyroid nodule.
Unremarkable esophagus. No axillary adenopathy. Mildly enlarged
cm subcarinal node (series 15/image 209), stable since 02/17/2015
chest CT. No new pathologically enlarged mediastinal nodes. No hilar
adenopathy.

Lungs/Pleura: No pneumothorax. No pleural effusion. Mosaic
attenuation throughout both lungs. Diffuse bronchial wall
thickening. No acute consolidative airspace disease, lung masses or
significant pulmonary nodules.

Musculoskeletal: No aggressive appearing focal osseous lesions.
Moderate thoracic spondylosis. Intervertebral disc ankylosis in the
lower thoracic spine.

CTA ABDOMEN AND PELVIS FINDINGS

Hepatobiliary: Normal liver with no liver mass. Cholelithiasis. No
biliary ductal dilatation.

Pancreas: Normal, with no mass or duct dilation.

Spleen: Normal size. No mass.

Adrenals/Urinary Tract: Normal adrenals. No hydronephrosis.
Parapelvic renal cysts in both kidneys. Exophytic simple 1.0 cm
lower left renal cortical cyst. Additional scattered subcentimeter
hypodense renal cortical lesions in both kidneys are too small to
characterize and require no follow-up. Normal bladder.

Stomach/Bowel: Normal non-distended stomach. Normal caliber small
bowel with no small bowel wall thickening. Normal appendix. Mild
sigmoid diverticulosis, with no large bowel wall thickening or
significant pericolonic fat stranding.

Vascular/Lymphatic: Atherosclerotic nonaneurysmal abdominal aorta.
Patent portal, splenic and renal veins. No pathologically enlarged
lymph nodes in the abdomen or pelvis.

Reproductive: Normal uterus. No discrete adnexal mass. Mild
asymmetric soft tissue prominence in the right adnexa is unchanged
since 0556 CT, considered benign.

Other: No pneumoperitoneum, ascites or focal fluid collection.

Musculoskeletal: No aggressive appearing focal osseous lesions.
Marked lumbar spondylosis.

VASCULAR MEASUREMENTS PERTINENT TO TAVR:

AORTA:

Minimal Aortic Giameter-8V.N x 14.2 mm

Severity of Aortic Calcification-moderate

RIGHT PELVIS:

Right Common Iliac Artery -

Minimal Yiameter-Y.Z x 6.3 mm

Tortuosity-mild

Calcification-moderate to severe

Right External Iliac Artery -

Minimal Ziameter-Q.U x 7.4 mm

Tortuosity-moderate to severe

Calcification-none

Right Common Femoral Artery -

Minimal Fiameter-V.O x 5.4 mm

Tortuosity-mild

Calcification-mild

LEFT PELVIS:

Left Common Iliac Artery -

Minimal 3iameter-G.Z x 7.9 mm

Tortuosity-moderate

Calcification-moderate

Left External Iliac Artery -

Minimal 5iameter-K.E x 8.3 mm

Tortuosity-moderate

Calcification-none

Left Common Femoral Artery -

Minimal Aiameter-Z.D x 7.9 mm

Tortuosity-mild

Calcification-mild

Review of the MIP images confirms the above findings.
IMPRESSION: 1. Vascular findings and measurements pertinent to potential TAVR
procedure, as detailed.
2. Diffuse thickening and calcification of the aortic valve,
compatible with a reported history of severe symptomatic aortic
stenosis.
3. Moderate cardiomegaly.  Three-vessel coronary atherosclerosis.
4. Mosaic attenuation throughout both lungs, nonspecific, which
could be due to air trapping from small airways disease or mosaic
perfusion from pulmonary vascular disease.
5. Cholelithiasis.
6. Mild sigmoid diverticulosis.
7. Aortic Atherosclerosis ([id number]-69A.A).

## 2021-04-02 IMAGING — CT CT CTA ABD/PEL W/CM AND/OR W/O CM
2 of 17 series · 9 of 46 positions shown, 15 images · IV contrast (APPLIED)
Comparison: 07/10/2019 CT abdomen/pelvis. 02/17/2015 chest CT
angiogram.

CLINICAL DATA: Severe symptomatic aortic stenosis. Pre-TAVR
evaluation.

EXAM:
CT ANGIOGRAPHY CHEST
CTA ABDOMEN AND PELVIS WITH CONTRAST
TECHNIQUE: Multidetector CT imaging through the chest was performed using the
standard protocol during bolus administration of intravenous
contrast. Multiplanar reconstructed images and MIPs were obtained
and reviewed to evaluate the vascular anatomy.

[Series 9: 0-90% · axial · 0.39mm/px · z∈[+1125,+1293]mm · 8 of 3500 slices shown, 13 images]
[im 350/3500  soft-tissue]
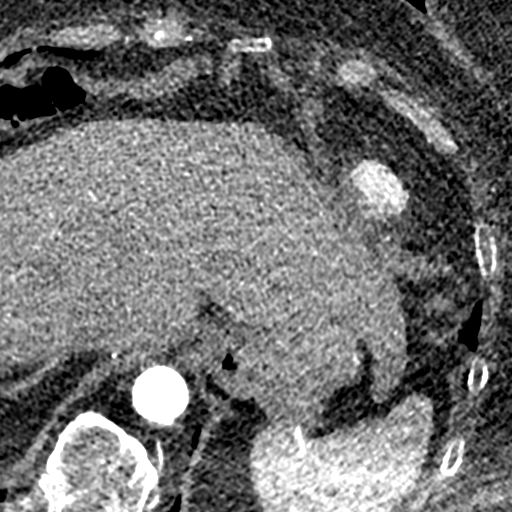
[im 350/3500  bone]
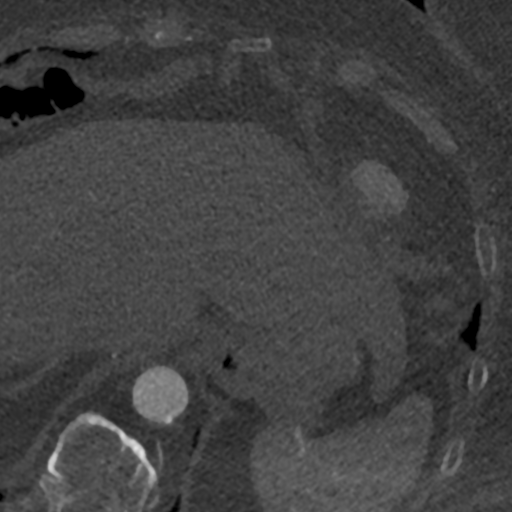
[im 700/3500  soft-tissue]
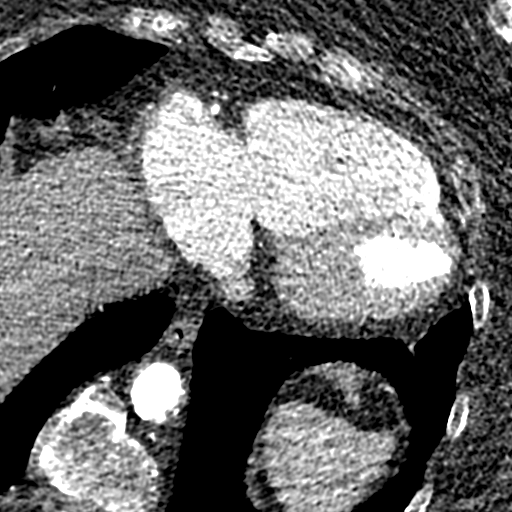
[im 1050/3500  soft-tissue]
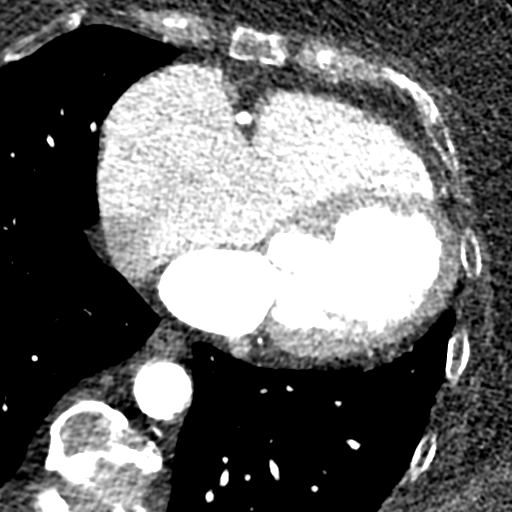
[im 1400/3500  soft-tissue]
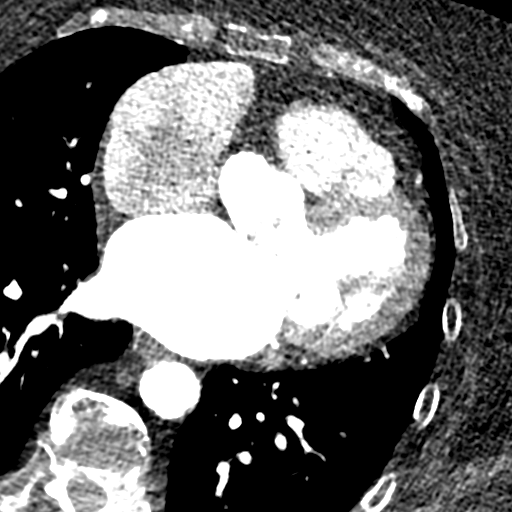
[im 2100/3500  soft-tissue]
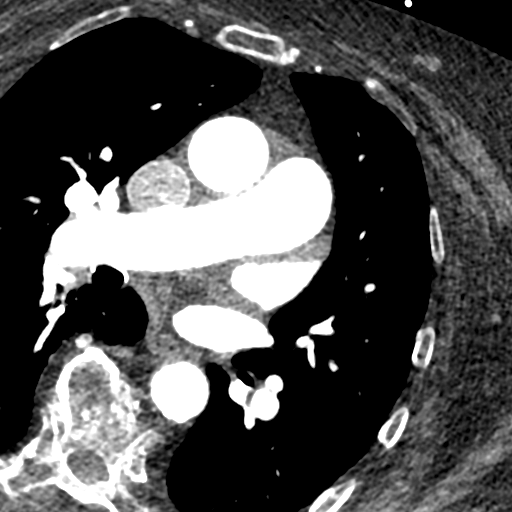
[im 2100/3500  lung]
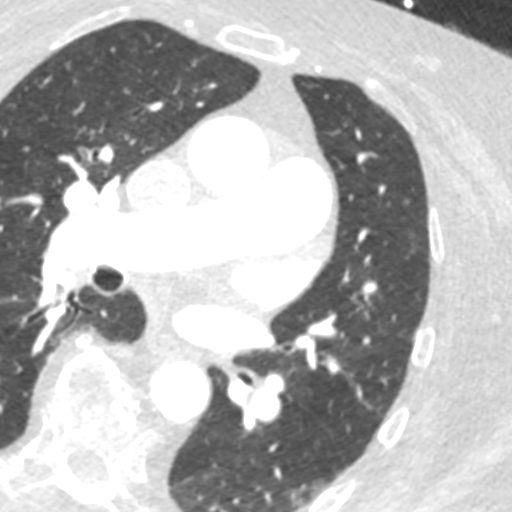
[im 2450/3500  soft-tissue]
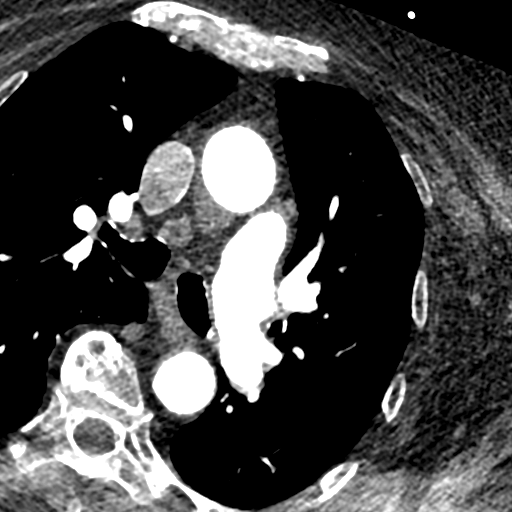
[im 2450/3500  lung]
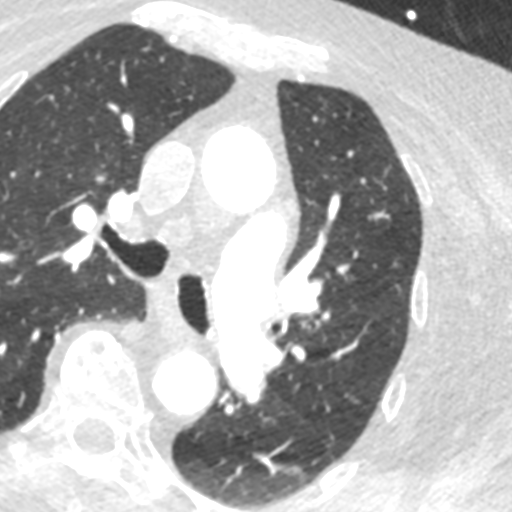
[im 2800/3500  soft-tissue]
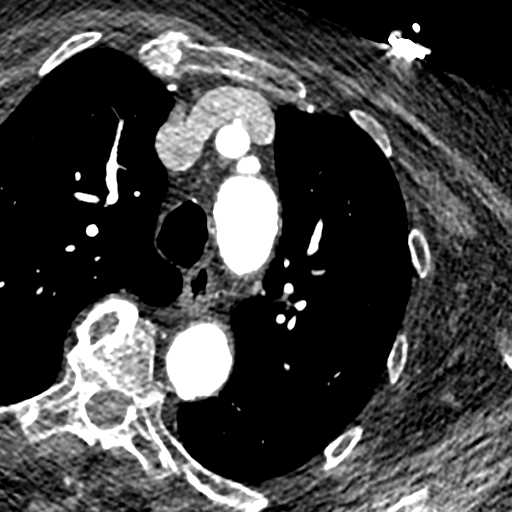
[im 2800/3500  lung]
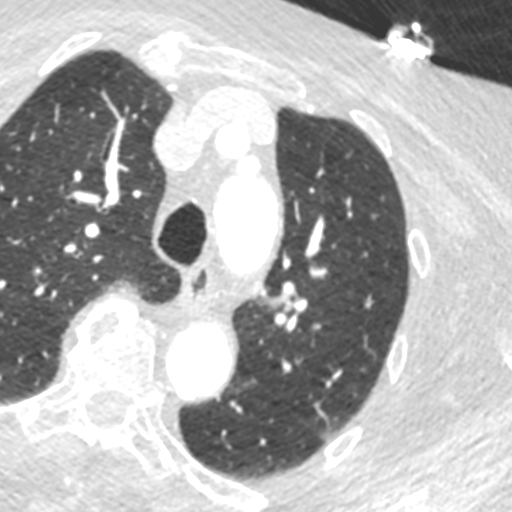
[im 3150/3500  soft-tissue]
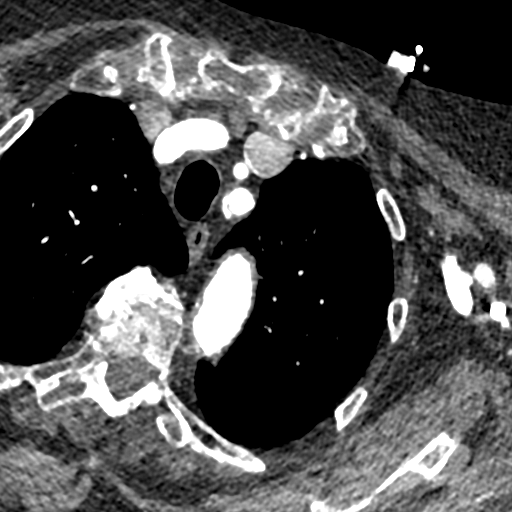
[im 3150/3500  lung]
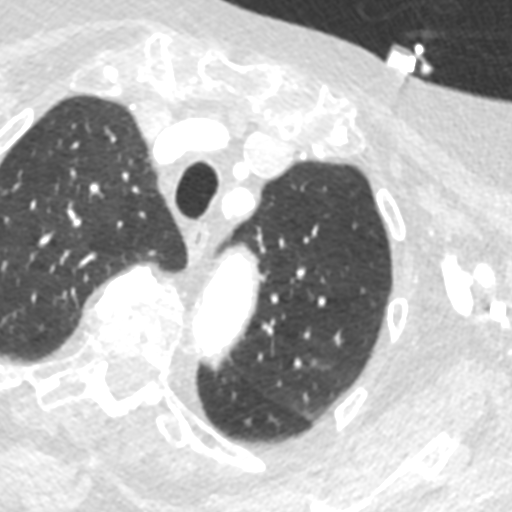

[Series 18: cor · coronal · 0.86mm/px · 1 of 161 slices shown, 2 images]
[im 81/161  soft-tissue]
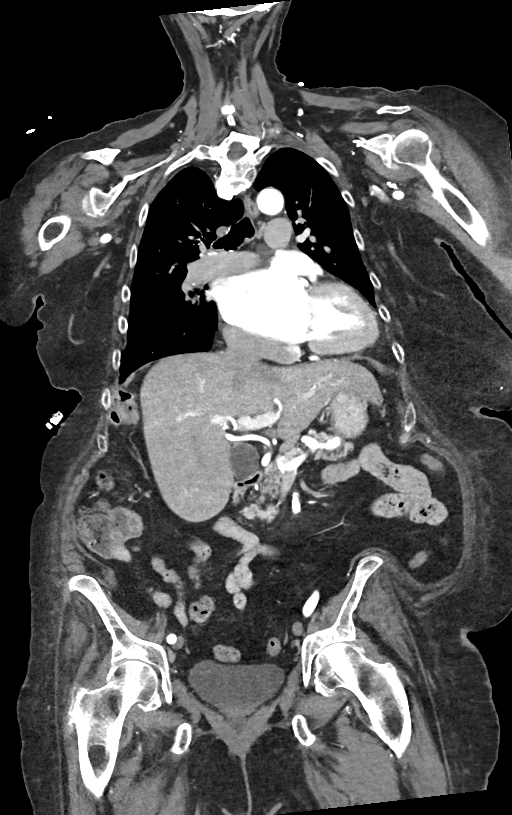
[im 81/161  bone]
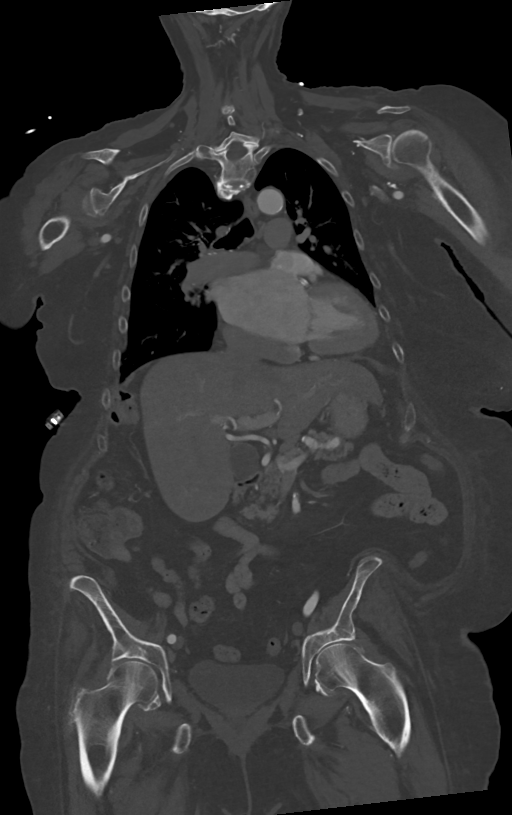

[9 of 46 positions shown; findings below may reference images not displayed]

Multidetector CT imaging of the abdomen and pelvis was performed
using the standard protocol before and during bolus administration
of intravenous contrast. Multiplanar reconstructed images and MIPs
were obtained and reviewed to evaluate the vascular anatomy.

CONTRAST:  100mL OMNIPAQUE IOHEXOL 350 MG/ML SOLN
FINDINGS: CTA CHEST FINDINGS

Cardiovascular: Moderate cardiomegaly. No significant pericardial
effusion/thickening. Diffuse thickening and calcification of the
aortic valve. Three-vessel coronary atherosclerosis. Atherosclerotic
nonaneurysmal thoracic aorta. Top-normal caliber main pulmonary
artery (3.3 cm diameter). No central pulmonary emboli.

Mediastinum/Nodes: Dominant calcified 1.2 cm left thyroid nodule.
Unremarkable esophagus. No axillary adenopathy. Mildly enlarged
cm subcarinal node (series 15/image 209), stable since 02/17/2015
chest CT. No new pathologically enlarged mediastinal nodes. No hilar
adenopathy.

Lungs/Pleura: No pneumothorax. No pleural effusion. Mosaic
attenuation throughout both lungs. Diffuse bronchial wall
thickening. No acute consolidative airspace disease, lung masses or
significant pulmonary nodules.

Musculoskeletal: No aggressive appearing focal osseous lesions.
Moderate thoracic spondylosis. Intervertebral disc ankylosis in the
lower thoracic spine.

CTA ABDOMEN AND PELVIS FINDINGS

Hepatobiliary: Normal liver with no liver mass. Cholelithiasis. No
biliary ductal dilatation.

Pancreas: Normal, with no mass or duct dilation.

Spleen: Normal size. No mass.

Adrenals/Urinary Tract: Normal adrenals. No hydronephrosis.
Parapelvic renal cysts in both kidneys. Exophytic simple 1.0 cm
lower left renal cortical cyst. Additional scattered subcentimeter
hypodense renal cortical lesions in both kidneys are too small to
characterize and require no follow-up. Normal bladder.

Stomach/Bowel: Normal non-distended stomach. Normal caliber small
bowel with no small bowel wall thickening. Normal appendix. Mild
sigmoid diverticulosis, with no large bowel wall thickening or
significant pericolonic fat stranding.

Vascular/Lymphatic: Atherosclerotic nonaneurysmal abdominal aorta.
Patent portal, splenic and renal veins. No pathologically enlarged
lymph nodes in the abdomen or pelvis.

Reproductive: Normal uterus. No discrete adnexal mass. Mild
asymmetric soft tissue prominence in the right adnexa is unchanged
since 0556 CT, considered benign.

Other: No pneumoperitoneum, ascites or focal fluid collection.

Musculoskeletal: No aggressive appearing focal osseous lesions.
Marked lumbar spondylosis.

VASCULAR MEASUREMENTS PERTINENT TO TAVR:

AORTA:

Minimal Aortic Giameter-8V.N x 14.2 mm

Severity of Aortic Calcification-moderate

RIGHT PELVIS:

Right Common Iliac Artery -

Minimal Yiameter-Y.Z x 6.3 mm

Tortuosity-mild

Calcification-moderate to severe

Right External Iliac Artery -

Minimal Ziameter-Q.U x 7.4 mm

Tortuosity-moderate to severe

Calcification-none

Right Common Femoral Artery -

Minimal Fiameter-V.O x 5.4 mm

Tortuosity-mild

Calcification-mild

LEFT PELVIS:

Left Common Iliac Artery -

Minimal 3iameter-G.Z x 7.9 mm

Tortuosity-moderate

Calcification-moderate

Left External Iliac Artery -

Minimal 5iameter-K.E x 8.3 mm

Tortuosity-moderate

Calcification-none

Left Common Femoral Artery -

Minimal Aiameter-Z.D x 7.9 mm

Tortuosity-mild

Calcification-mild

Review of the MIP images confirms the above findings.
IMPRESSION: 1. Vascular findings and measurements pertinent to potential TAVR
procedure, as detailed.
2. Diffuse thickening and calcification of the aortic valve,
compatible with a reported history of severe symptomatic aortic
stenosis.
3. Moderate cardiomegaly.  Three-vessel coronary atherosclerosis.
4. Mosaic attenuation throughout both lungs, nonspecific, which
could be due to air trapping from small airways disease or mosaic
perfusion from pulmonary vascular disease.
5. Cholelithiasis.
6. Mild sigmoid diverticulosis.
7. Aortic Atherosclerosis ([id number].A).

## 2021-04-12 IMAGING — CR DG CHEST 2V
2 series · 2 of 2 positions shown · non-contrast
Comparison: 07/10/2019

CLINICAL DATA: Preoperative evaluation for open heart surgery,
coronary artery disease post MI, hypertension, atrial fibrillation,
dementia

EXAM:
CHEST - 2 VIEW

[w chest lat]
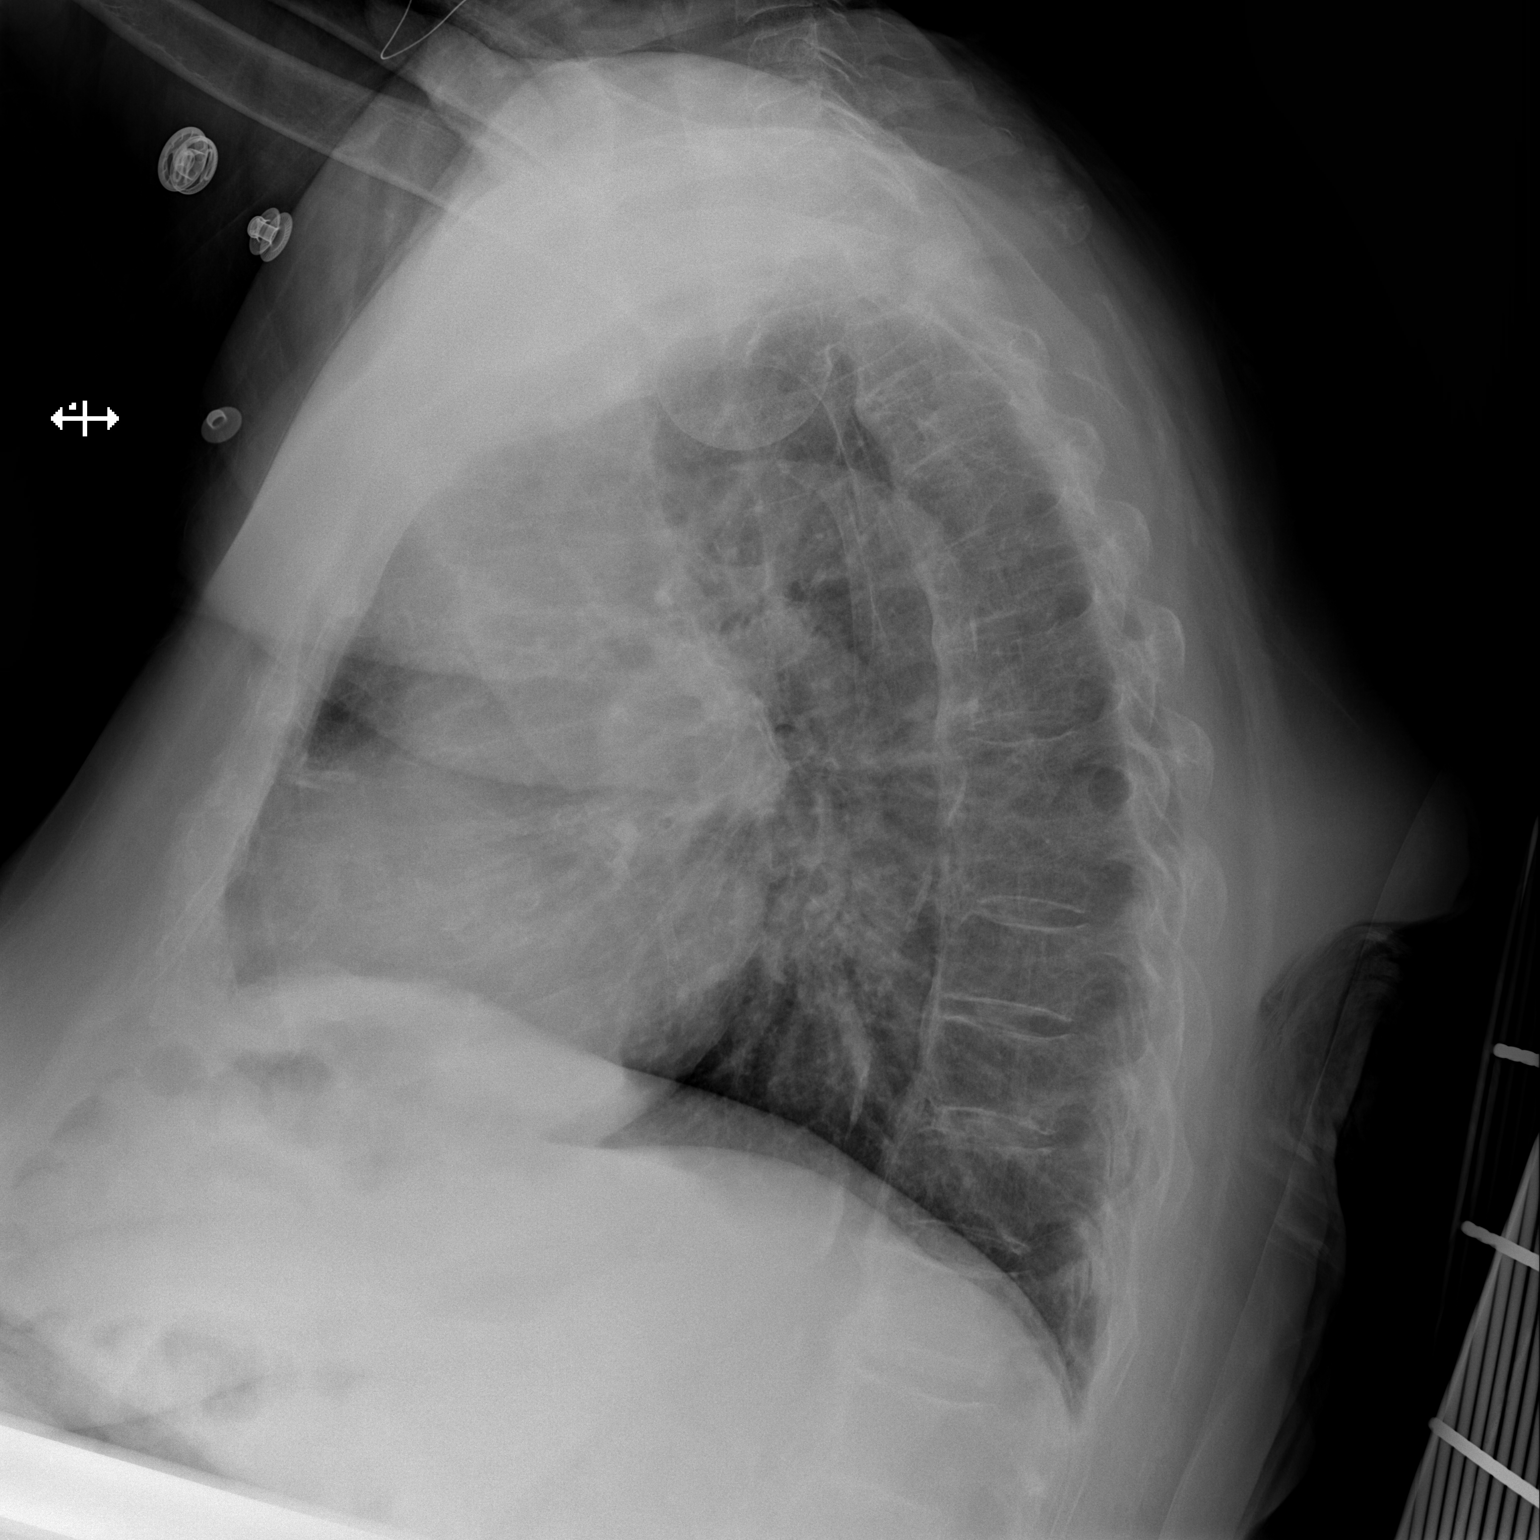

[x chest ap]
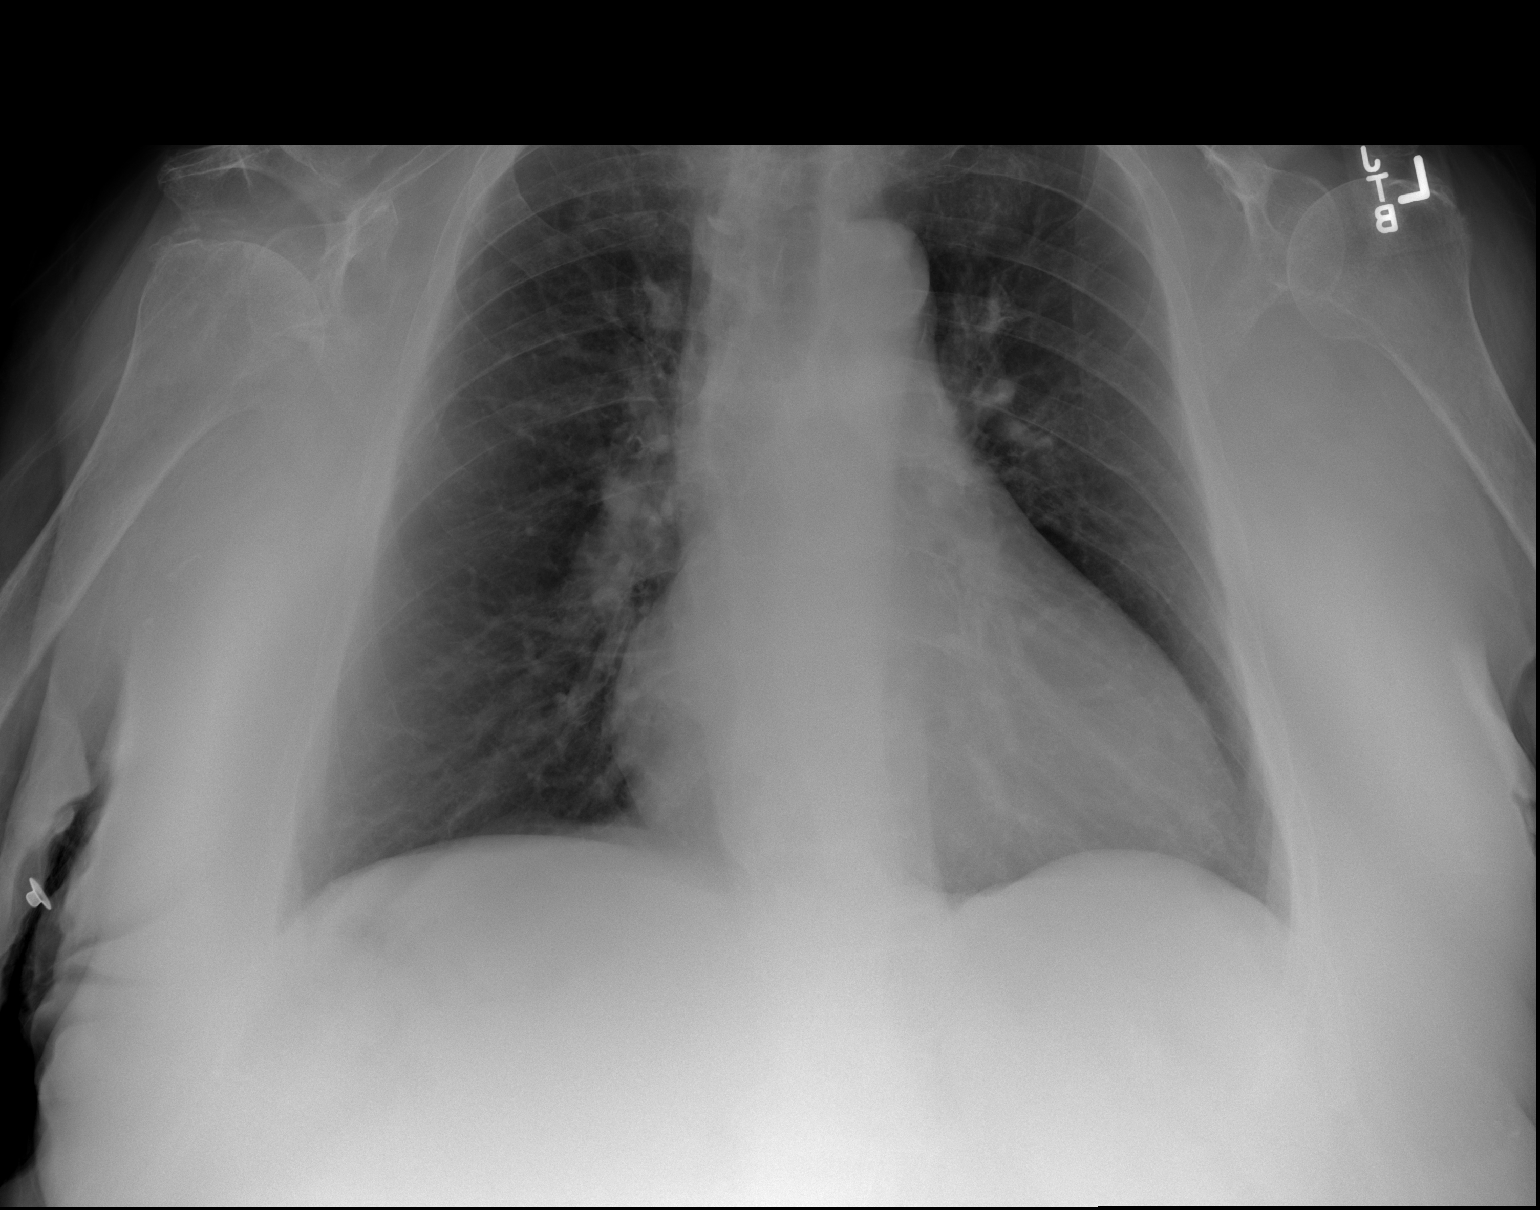

[2 of 2 positions shown; findings below may reference images not displayed]

FINDINGS: Enlargement of cardiac silhouette.

Mediastinal contours and pulmonary vascularity normal.

Mild bronchitic changes.

No acute infiltrate, pleural effusion, or pneumothorax.

Bones demineralized.
IMPRESSION: Enlargement of cardiac silhouette.

Mild chronic bronchitic changes without infiltrate.

## 2021-05-29 IMAGING — CT CT ABD-PELV W/O CM
2 of 4 series · 16 of 46 positions shown, 18 images · non-contrast
Comparison: 11/16/2019.

CLINICAL DATA: Acute generalized abdominal pain with neutropenia.

EXAM:
CT ABDOMEN AND PELVIS WITHOUT CONTRAST
TECHNIQUE: Multidetector CT imaging of the abdomen and pelvis was performed
following the standard protocol without IV contrast.

[Series 2: axial st · axial · 0.98mm/px · z∈[-496,-96]mm · 13 of 88 slices shown, 15 images]
[im 4/88  soft-tissue]
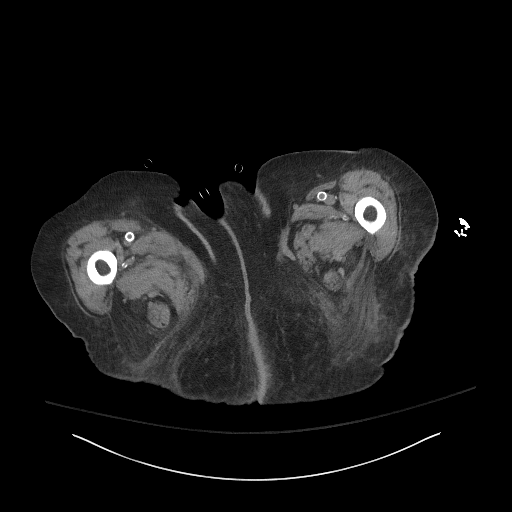
[im 4/88  bone]
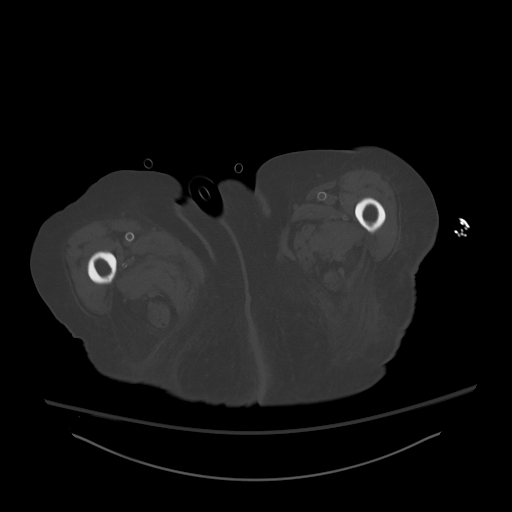
[im 12/88  soft-tissue]
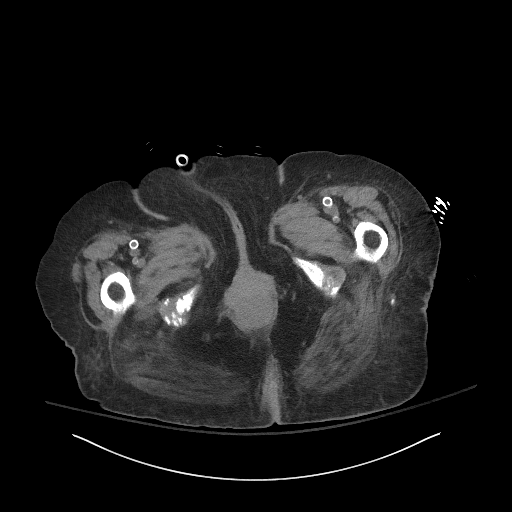
[im 20/88  soft-tissue]
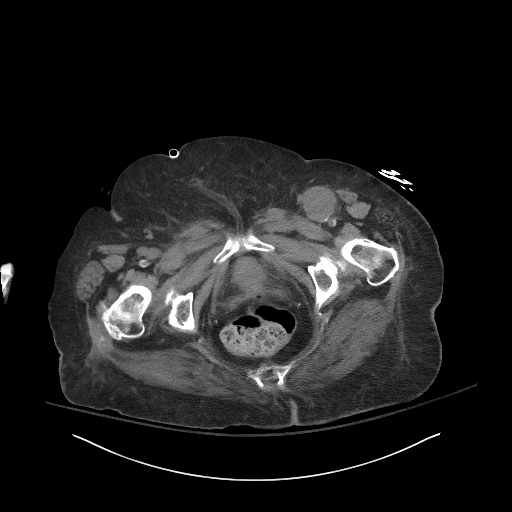
[im 24/88  soft-tissue]
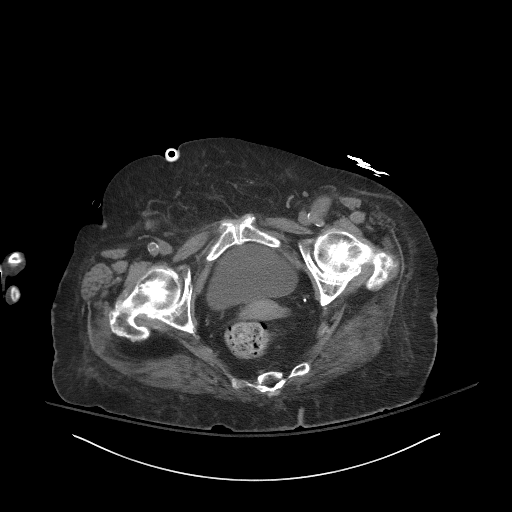
[im 32/88  soft-tissue]
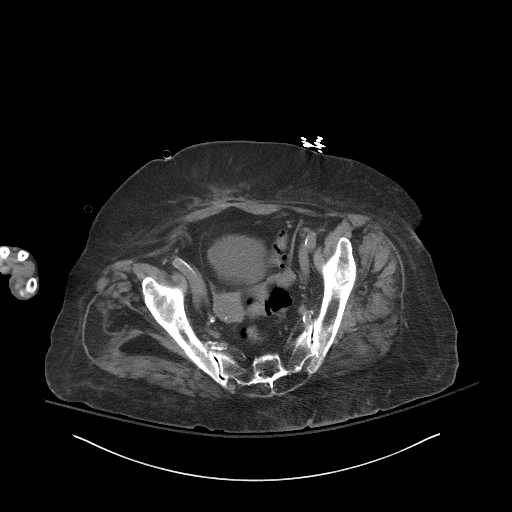
[im 36/88  soft-tissue]
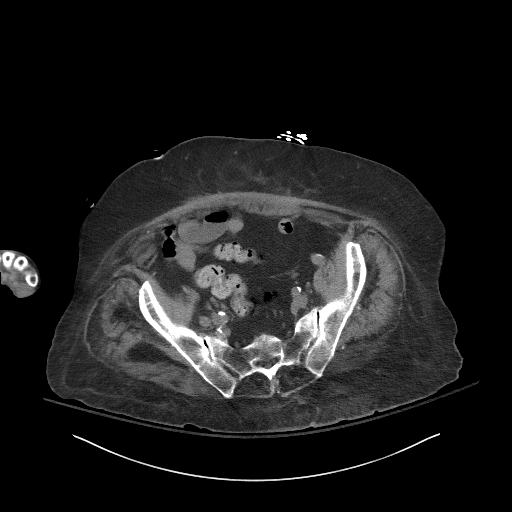
[im 44/88  soft-tissue]
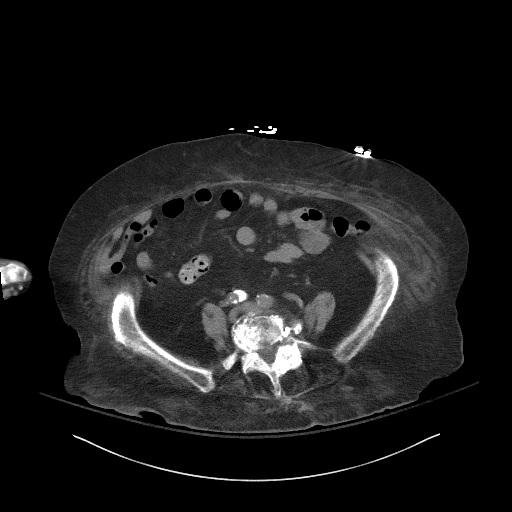
[im 52/88  soft-tissue]
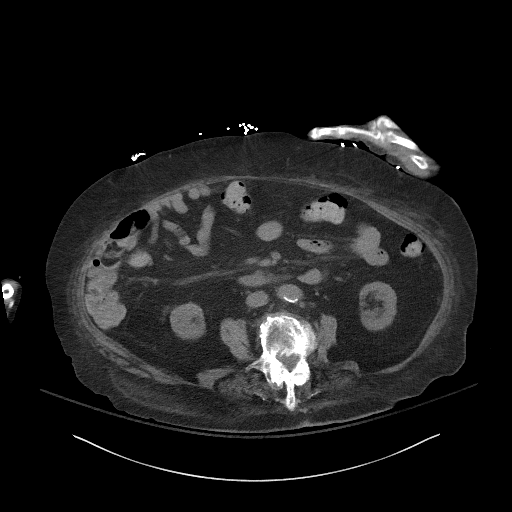
[im 56/88  soft-tissue]
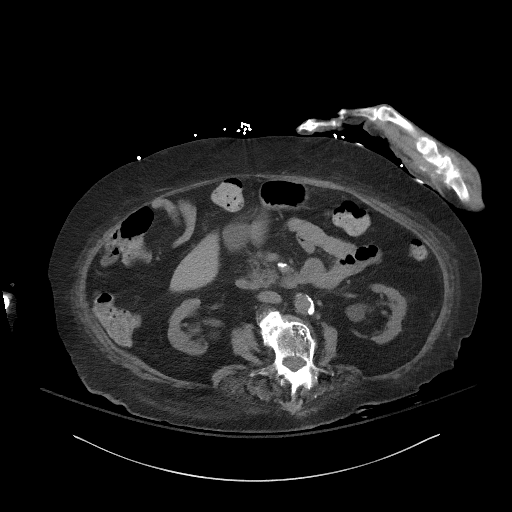
[im 56/88  bone]
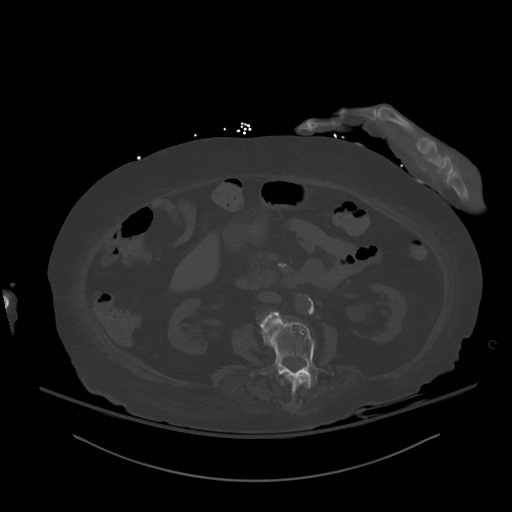
[im 64/88  soft-tissue]
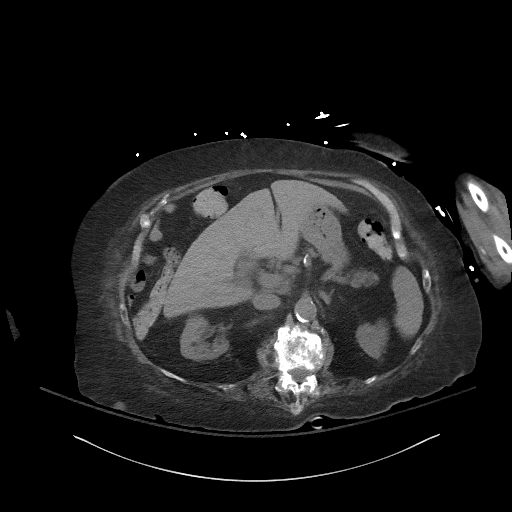
[im 68/88  soft-tissue]
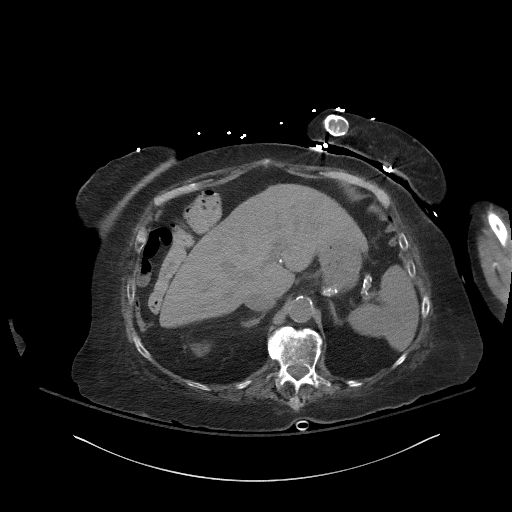
[im 76/88  soft-tissue]
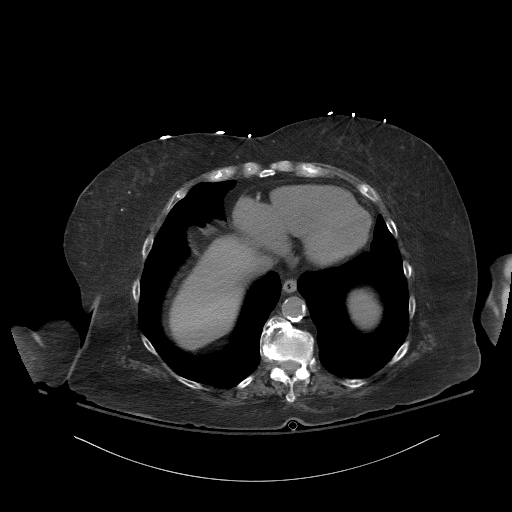
[im 84/88  soft-tissue]
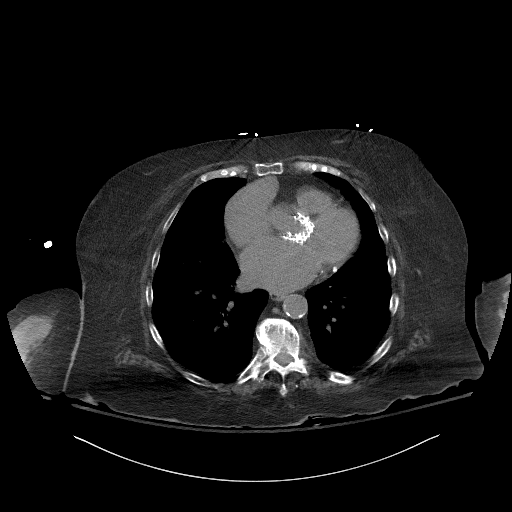

[Series 4: coronal st · coronal · 0.86mm/px · 3 of 113 slices shown]
[im 38/113  soft-tissue]
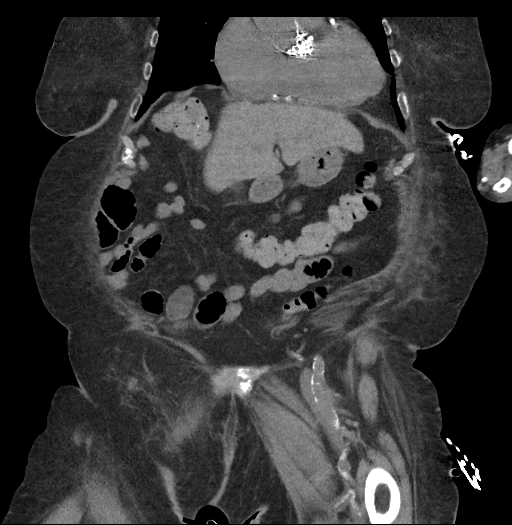
[im 50/113  soft-tissue]
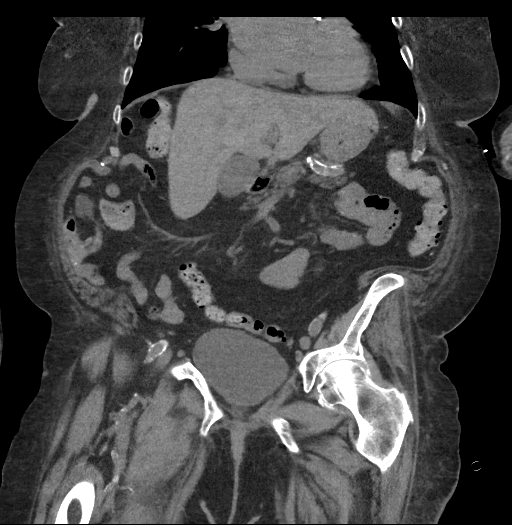
[im 63/113  soft-tissue]
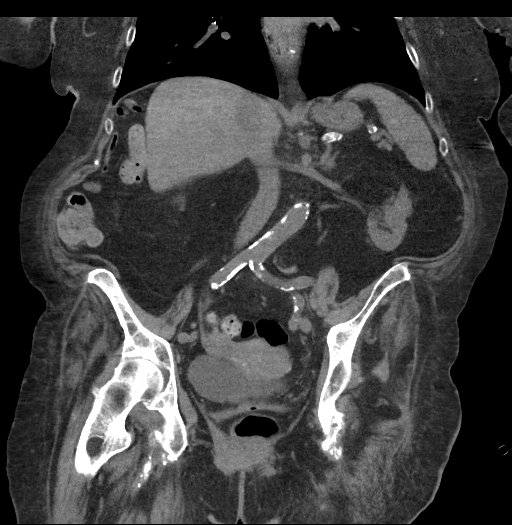

[16 of 46 positions shown; findings below may reference images not displayed]

FINDINGS: Lower chest: Lung bases are clear. Atherosclerotic calcification of
the aorta and coronary arteries. Aortic valve replacement. Heart is
enlarged. No pericardial or pleural effusion. Distal esophagus is
unremarkable.

Hepatobiliary: Liver is unremarkable. A 1.6 cm stone is seen in the
gallbladder. No biliary ductal dilatation.

Pancreas: Negative.

Spleen: Negative.

Adrenals/Urinary Tract: Adrenal glands are unremarkable. Tiny stone
in the lower pole right kidney. Probable renal sinus cysts on the
right. Subcentimeter low-attenuation lesions in the left kidney are
too small to characterize. Ureters are decompressed. Bladder is low
in volume.

Stomach/Bowel: Stomach, small bowel, appendix and colon are
unremarkable.

Vascular/Lymphatic: Atherosclerotic calcification of the aorta
without abdominal aortic aneurysm. There is a new presumed
pseudoaneurysm associated with the left common femoral artery,
measuring 3.6 cm. No pathologically enlarged lymph nodes.

Reproductive: Uterus is visualized.  No adnexal mass.

Other: Umbilical hernia contains fat. Postoperative changes along
the ventral abdominal wall. No free fluid. Mesenteries and
peritoneum are otherwise unremarkable.

Musculoskeletal: Advanced degenerative changes in the spine. No
worrisome lytic or sclerotic lesions.
IMPRESSION: 1. No acute findings to explain the patient's abdominal pain.
2. Presumed pseudoaneurysm associated with the left common femoral
artery, new from 11/16/2019 and possibly iatrogenic in nature.
Please correlate clinically.
3. Cholelithiasis.
4. Tiny right renal stone.
5. Aortic atherosclerosis ([id number]). Coronary artery
calcification.

## 2021-05-29 IMAGING — DX DG CHEST 1V PORT
1 series · 1 of 1 positions shown · non-contrast
Comparison: Chest x-ray dated November 30, 2019.

CLINICAL DATA: Hypotension.

EXAM:
PORTABLE CHEST 1 VIEW

[chest ap]
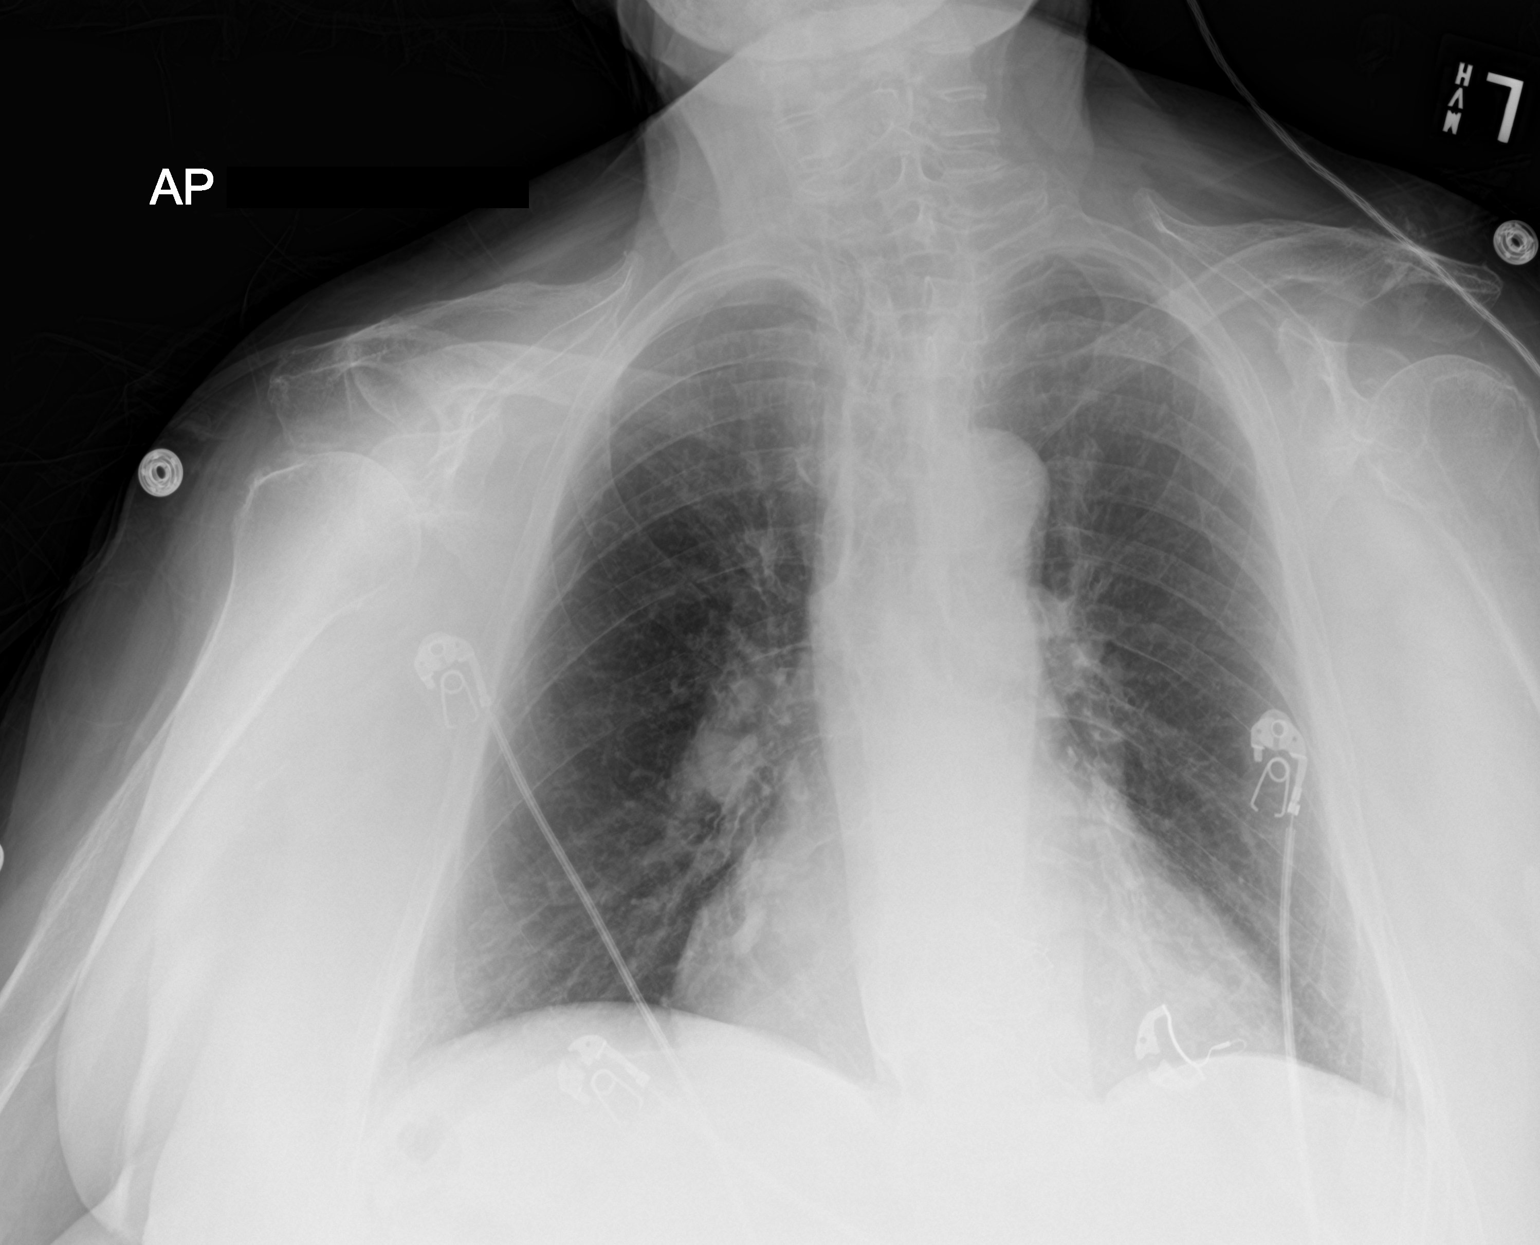

[1 of 1 positions shown; findings below may reference images not displayed]

FINDINGS: Unchanged mild cardiomegaly status post TAVR. Normal pulmonary
vascularity. No focal consolidation, pleural effusion, or
pneumothorax. No acute osseous abnormality.
IMPRESSION: No active disease.

## 2021-07-21 IMAGING — DX DG CHEST 1V PORT
1 series · 1 of 1 positions shown · non-contrast
Comparison: Radiograph 11/30/2019

CLINICAL DATA: Shortness of breath, weakness and numerous falls

EXAM:
PORTABLE CHEST 1 VIEW

[chest ap]
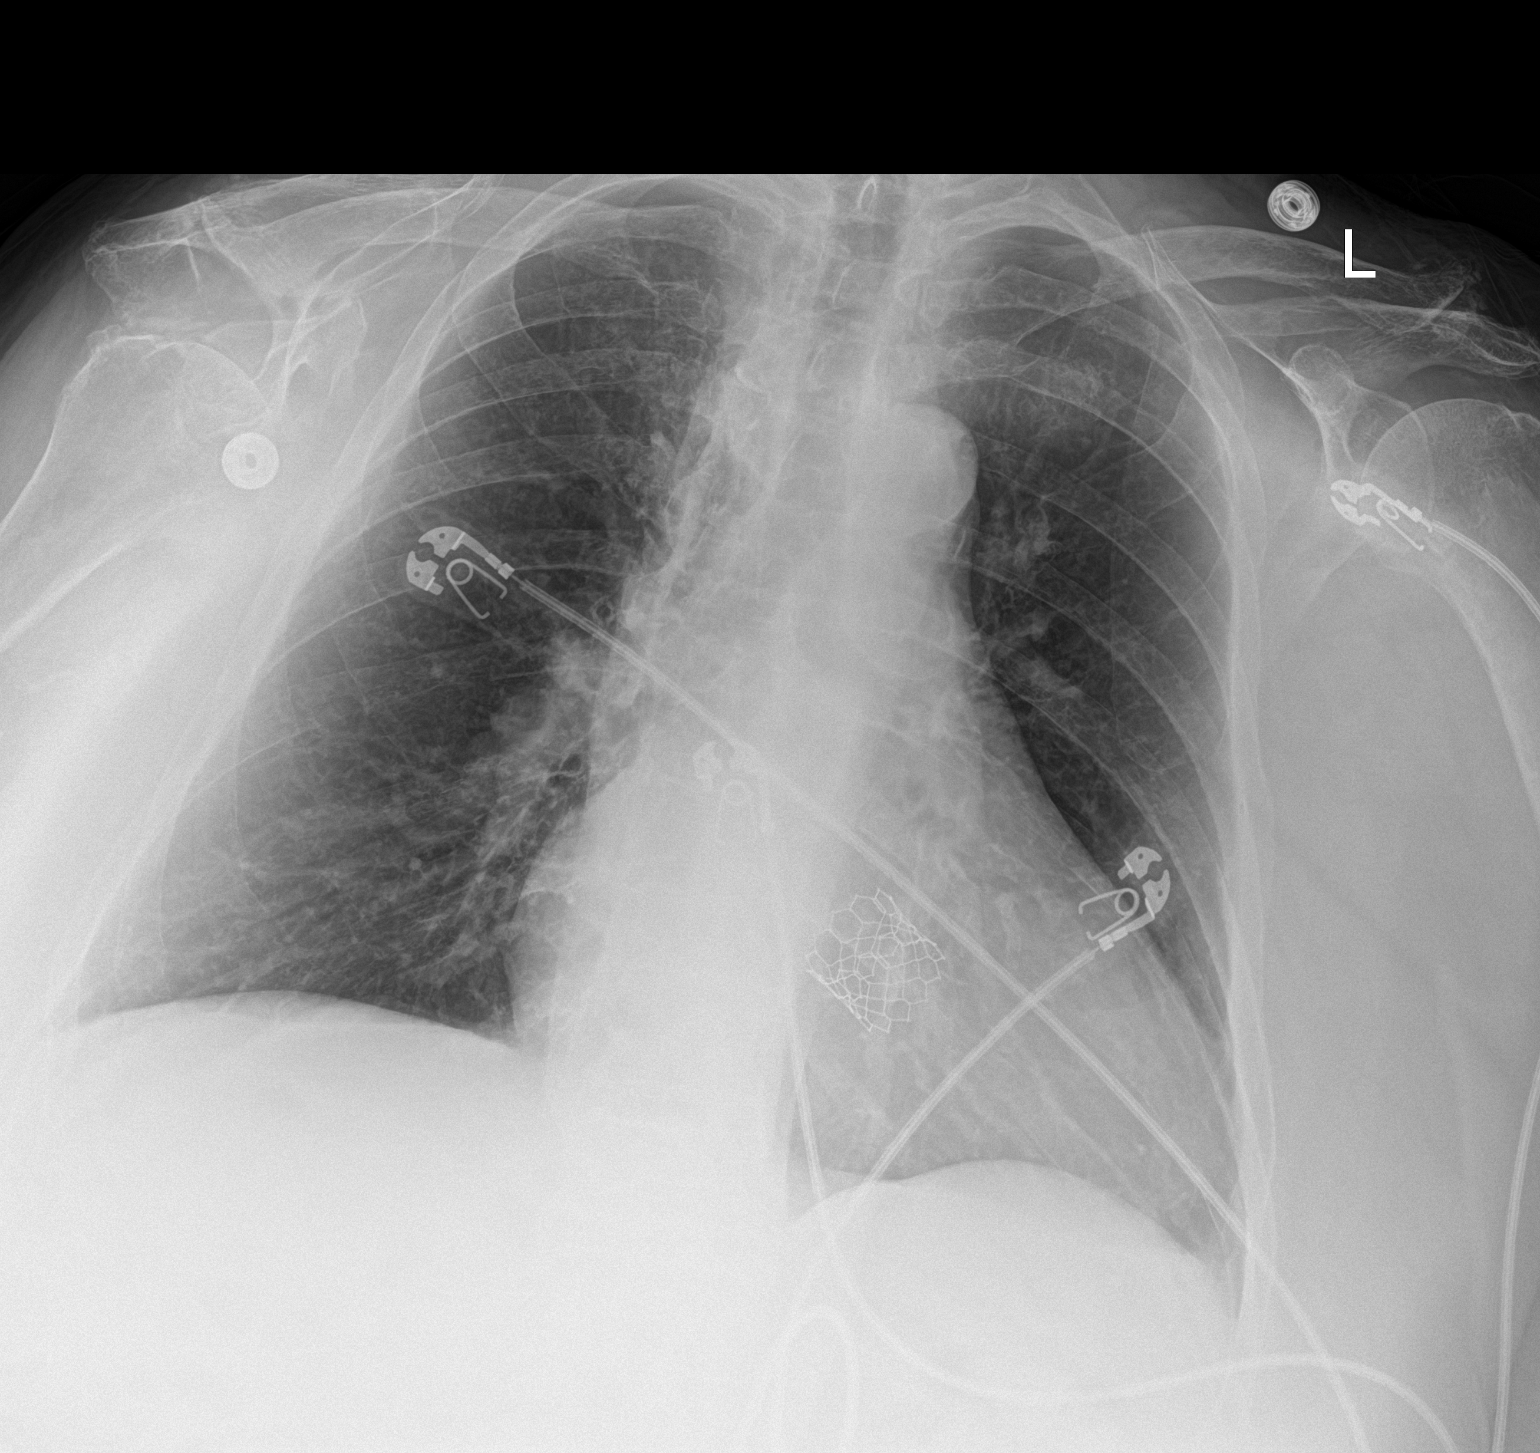

[1 of 1 positions shown; findings below may reference images not displayed]

FINDINGS: Aortic valve stent graft repair is in stable position from
comparison radiograph. Stable mild cardiomegaly with a calcified
aorta. No consolidation, features of edema, pneumothorax, or
effusion. No acute osseous or soft tissue abnormality. No acute
osseous or soft tissue abnormality. Degenerative changes are present
in the imaged spine and shoulders. The osseous structures appear
diffusely demineralized which may limit detection of small or
nondisplaced fractures.
IMPRESSION: No acute cardiopulmonary abnormality. Stable mild cardiomegaly.

Stable positioning of a transcatheter aortic valve repair.

## 2021-07-21 IMAGING — CT CT ANGIO NECK
1 of 11 series · 5 of 35 positions shown · IV contrast (omnipaque)
Comparison: CT head 07/10/2011

CLINICAL DATA: Dizziness, rule out dissection; cerebral hemorrhage
suspected. Additional history provided: Weakness and numerous falls
for 1 week.

EXAM:
CT ANGIOGRAPHY HEAD AND NECK
TECHNIQUE: Multidetector CT imaging of the head and neck was performed using
the standard protocol during bolus administration of intravenous
contrast. Multiplanar CT image reconstructions and MIPs were
obtained to evaluate the vascular anatomy. Carotid stenosis
measurements (when applicable) are obtained utilizing NASCET
criteria, using the distal internal carotid diameter as the
denominator.
CONTRAST:  60mL OMNIPAQUE IOHEXOL 350 MG/ML SOLN

[Series 11: cta neck axial · axial · 0.39mm/px · z∈[-211,-5]mm · 5 of 310 slices shown]
[im 52/310  soft-tissue]
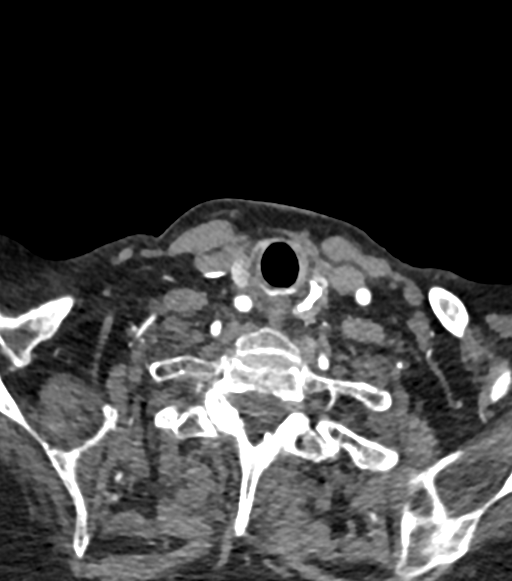
[im 104/310  bone]
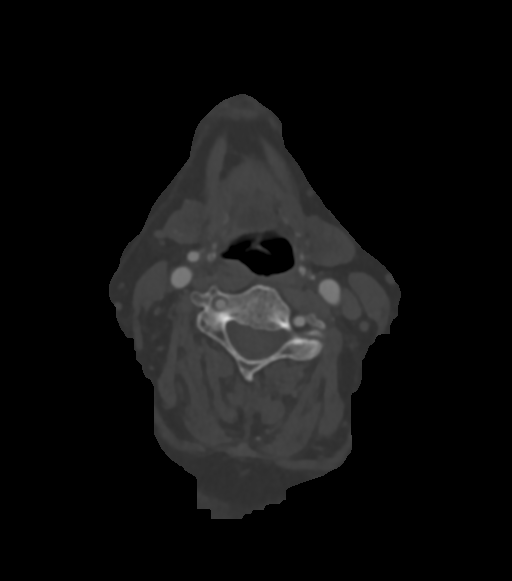
[im 155/310  soft-tissue]
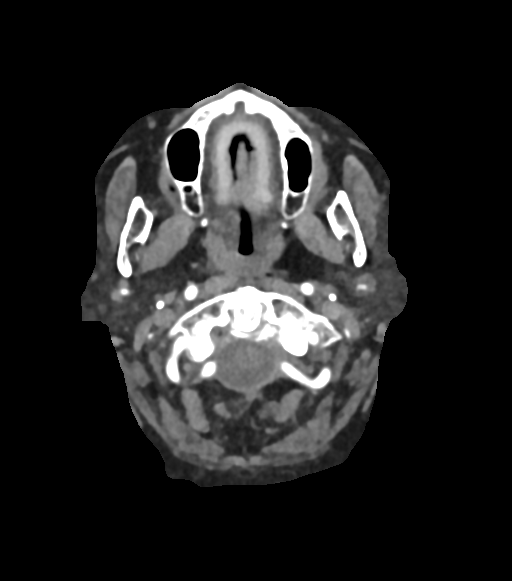
[im 207/310  bone]
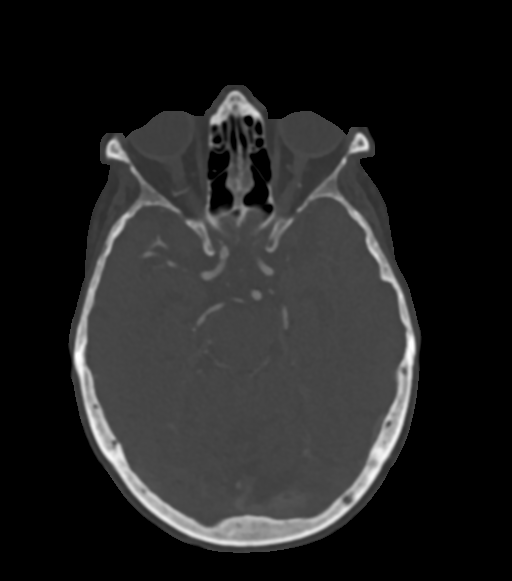
[im 258/310  soft-tissue]
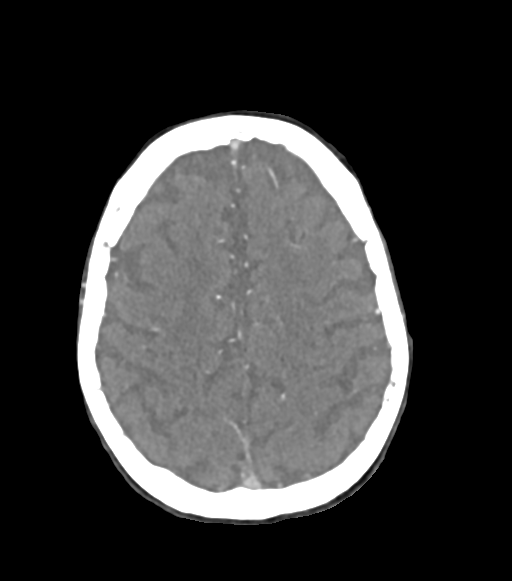

[5 of 35 positions shown; findings below may reference images not displayed]

FINDINGS: CT HEAD FINDINGS

Brain: There is no evidence of acute intracranial hemorrhage,
intracranial mass, midline shift or extra-axial fluid collection.No
demarcated cortical infarction. Moderate ill-defined hypoattenuation
within the cerebral white matter is nonspecific, but consistent with
chronic small vessel ischemic disease. Stable, moderate generalized
parenchymal atrophy.

Vascular: Reported separately.

Skull: Normal. Negative for fracture or focal lesion.

Sinuses: No significant paranasal sinus disease or mastoid effusion
at the imaged levels.

Orbits: Visualized orbits demonstrate no acute abnormality.

Review of the MIP images confirms the above findings

CTA NECK FINDINGS

Aortic arch: Please note the origin of the innominate and left
common carotid arteries are not included in the field of view. Mixed
plaque within the visualized aortic arch and proximal major branch
vessels of the neck. No significant innominate or proximal
subclavian artery stenosis within described limitations.

Right carotid system: CCA and ICA patent within the neck without
stenosis. Mild calcified plaque within the proximal external carotid
artery.

Left carotid system: The origin of the left common carotid artery is
excluded from the field of view. Within this limitation, the CCA and
ICA patent within the neck without stenosis.

Vertebral arteries: Codominant and patent within the neck. Mild
calcified plaque at the vertebral artery origins bilaterally without
significant stenosis.

Skeleton: No acute bony abnormality or aggressive osseous lesion.
Cervical spondylosis without high-grade bony spinal canal stenosis.
Partially visualized thoracic dextrocurvature.

Other neck: No soft tissue neck mass or cervical lymphadenopathy.
Diminutive thyroid gland with associated calcifications.

Upper chest: No consolidation within the imaged lung apices. Mild
patchy ground-glass opacity within the imaged right lung apex is
nonspecific but may reflect minimal edema.

Review of the MIP images confirms the above findings

CTA HEAD FINDINGS

Anterior circulation:

The intracranial internal carotid arteries are patent with scattered
calcified plaque. Mild stenosis within the cavernous right ICA. Up
to moderate stenosis within the paraclinoid right ICA. No more than
mild stenosis within the intracranial left ICA.

The M1 middle cerebral arteries are patent without significant
stenosis. No M2 proximal branch occlusion or high-grade proximal
stenosis is identified.

The anterior cerebral arteries are patent without high-grade
proximal stenosis.

1-2 mm tiny aneurysm versus infundibulum projecting inferiorly from
the supraclinoid right ICA (series 13, image 87).

Posterior circulation:

The intracranial vertebral arteries are patent without significant
stenosis, as is the basilar artery. Minimal calcified plaque within
the proximal basilar artery. The bilateral posterior cerebral
arteries are patent without significant proximal stenosis.

Venous sinuses: Within limitations of contrast timing, no convincing
thrombus.

Anatomic variants: Posterior communicating arteries are poorly
delineated and may be hypoplastic or absent bilaterally.

Review of the MIP images confirms the above findings
IMPRESSION: CT head:

1. No evidence of acute intracranial abnormality.
2. Stable, moderate generalized parenchymal atrophy and chronic
small vessel ischemic disease.

CTA neck:

1. The origin of the innominate artery is excluded from the field of
view.
2. The origin of the left common carotid arteries excluded from the
field of view. Within this limitation, the bilateral common carotid,
internal carotid and vertebral arteries are patent within the neck
without significant stenosis. Mild atherosclerotic disease within
these vessels as described. No evidence of dissection.
3. Patchy ground-glass opacity within the imaged right lung apex.
Findings are nonspecific but may reflect minimal edema. Correlate
clinically for infection.

CTA head:

1. No intracranial large vessel occlusion or proximal high-grade
arterial stenosis.
2. Calcified plaque within the intracranial internal carotid
arteries. Within the right ICA, there is mild stenosis within the
cavernous segment and up to moderate stenosis within the paraclinoid
segment. No more than mild stenosis within the intracranial left
ICA.
3. 1-2 mm tiny aneurysm versus infundibulum arising from the
supraclinoid right ICA.

## 2021-07-21 IMAGING — MR MR HEAD W/O CM
12 of 13 series · 44 of 48 positions shown · non-contrast
Comparison: Prior CTA from earlier the same day.

CLINICAL DATA: Initial evaluation for focal neural deficit, stroke
suspected.

EXAM:
MRI HEAD WITHOUT CONTRAST
TECHNIQUE: Multiplanar, multiecho pulse sequences of the brain and surrounding
structures were obtained without intravenous contrast.

[Series 5: DWI · axial · 3.0mm · 0.88mm/px · z∈[-62,+71]mm · 8 of 92 slices shown (1 of 4)]
[im 1/92]
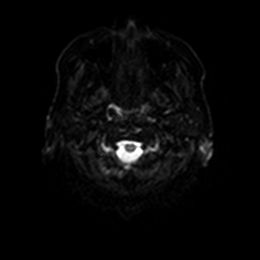
[im 14/92]
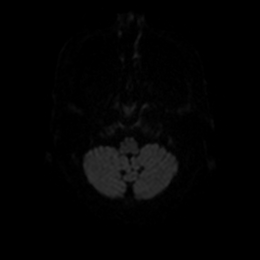
[im 27/92]
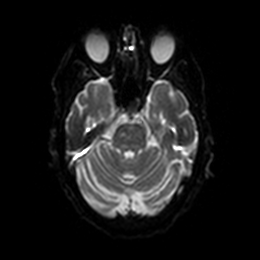
[im 40/92]
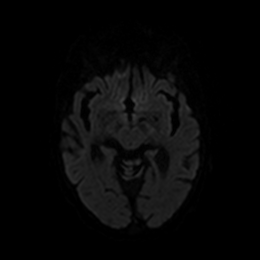
[im 53/92]
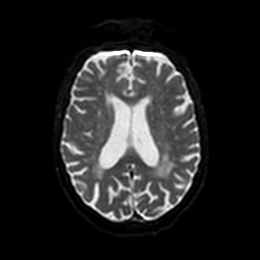
[im 66/92]
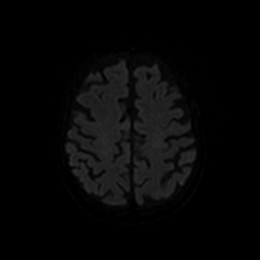
[im 79/92]
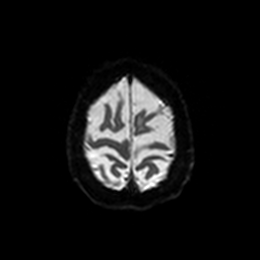
[im 92/92]
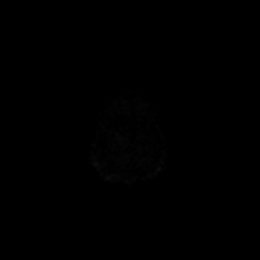

[Series 6: DWI · axial · 3.0mm · 0.88mm/px · z∈[-62,+71]mm · 4 of 46 slices shown (2 of 4)]
[im 1/46]
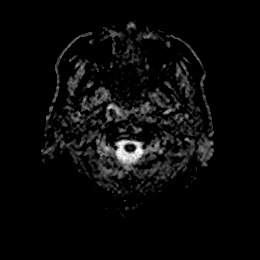
[im 16/46]
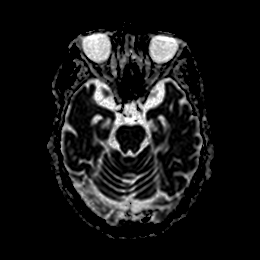
[im 31/46]
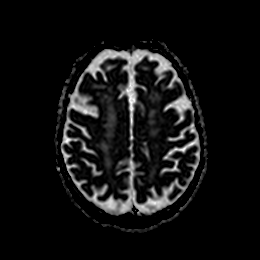
[im 46/46]
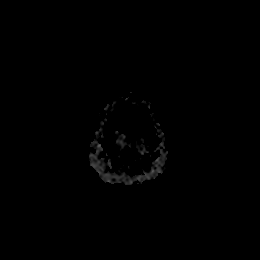

[Series 7: DWI · coronal · 4.0mm · 0.88mm/px · 5 of 66 slices shown (3 of 4)]
[im 1/66]
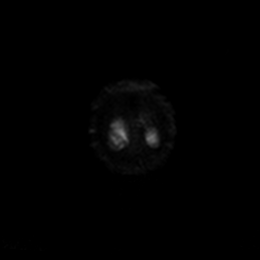
[im 17/66]
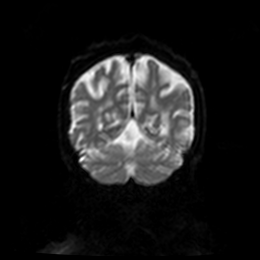
[im 33/66]
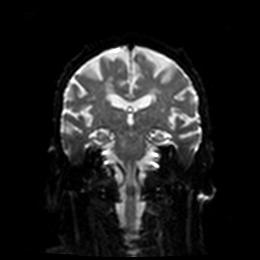
[im 49/66]
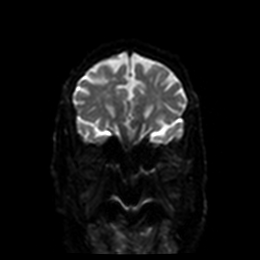
[im 66/66]
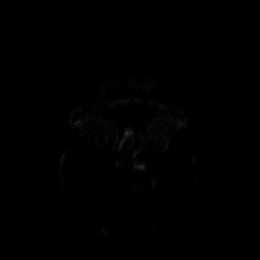

[Series 8: DWI · coronal · 4.0mm · 0.88mm/px · 3 of 33 slices shown (4 of 4)]
[im 1/33]
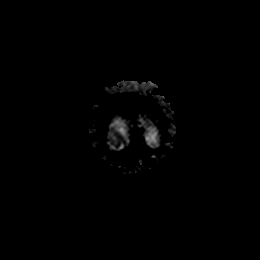
[im 17/33]
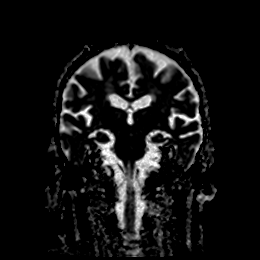
[im 33/33]
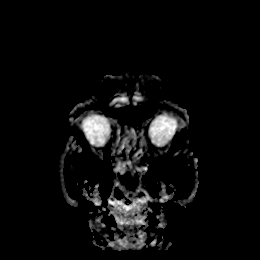

[Series 9: FLAIR · axial · 5.0mm · 0.45mm/px · z∈[-71,+71]mm · 2 of 25 slices shown]
[im 1/25]
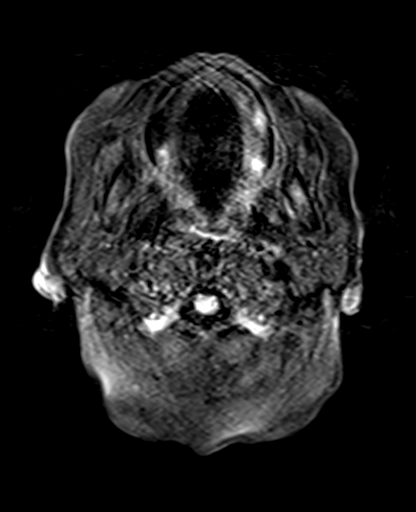
[im 25/25]
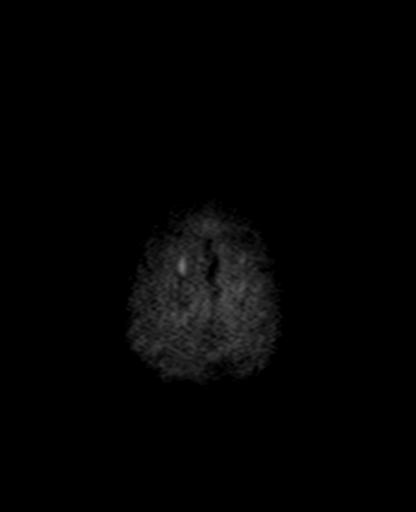

[Series 10: mag_images · axial · 3.0mm · 0.90mm/px · z∈[-75,+76]mm · 4 of 52 slices shown]
[im 1/52]
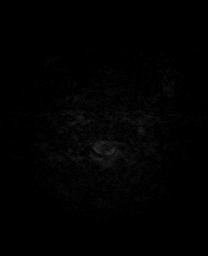
[im 18/52]
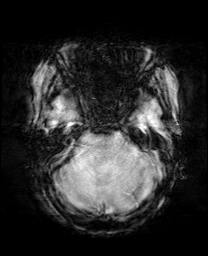
[im 35/52]
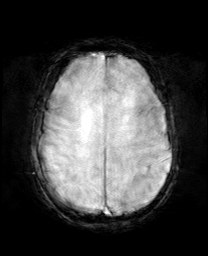
[im 52/52]
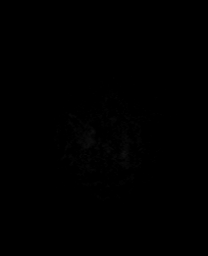

[Series 11: pha_images · axial · 3.0mm · 0.90mm/px · z∈[-72,+64]mm · 4 of 46 slices shown]
[im 1/46]
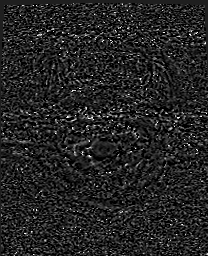
[im 16/46]
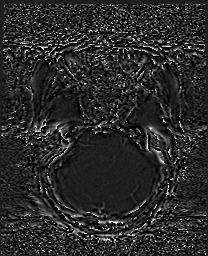
[im 31/46]
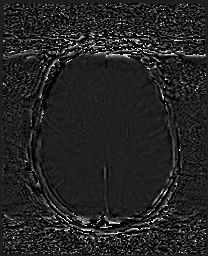
[im 46/46]
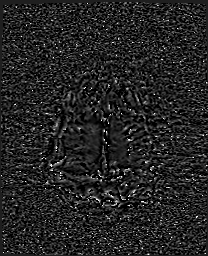

[Series 12: swi_images · axial · 3.0mm · 0.90mm/px · z∈[-75,+76]mm · 4 of 52 slices shown]
[im 1/52]
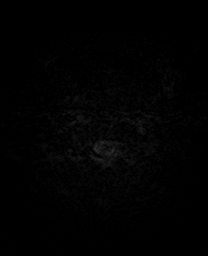
[im 18/52]
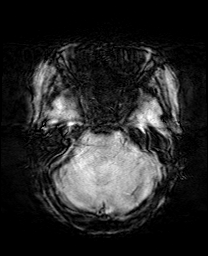
[im 35/52]
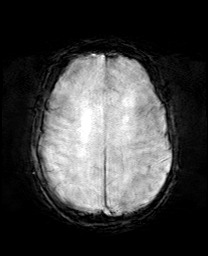
[im 52/52]
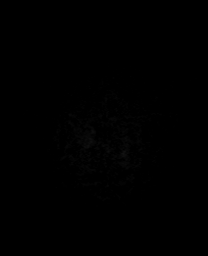

[Series 13: mip_images(sw) · axial · 24.0mm · 0.90mm/px · z∈[-65,+66]mm · 4 of 45 slices shown]
[im 1/45]
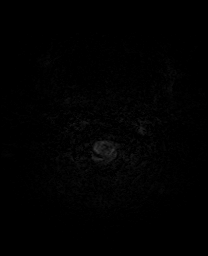
[im 15/45]
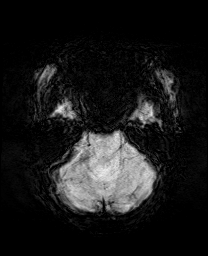
[im 30/45]
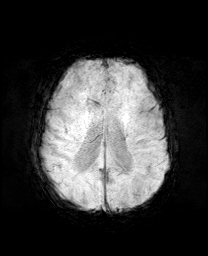
[im 45/45]
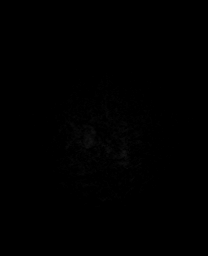

[Series 14: T2 · axial · 5.0mm · 0.72mm/px · z∈[-72,+70]mm · 2 of 25 slices shown (1 of 2)]
[im 1/25]
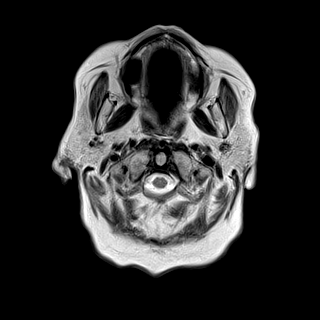
[im 25/25]
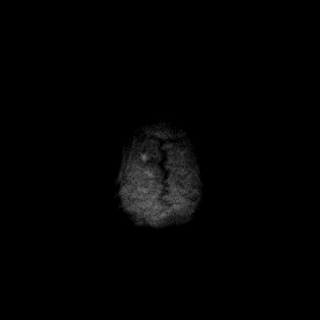

[Series 15: T1 · sagittal · 5.0mm · 0.72mm/px · 2 of 25 slices shown]
[im 1/25]
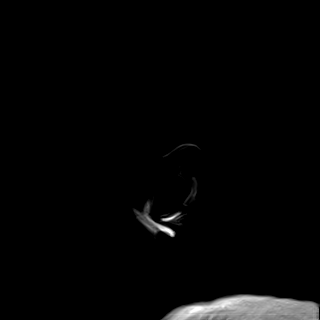
[im 25/25]
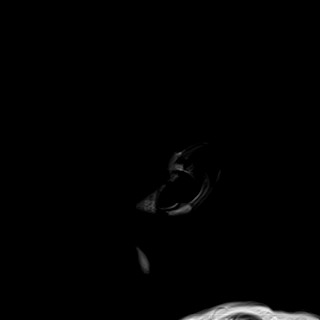

[Series 17: T2 · coronal · 5.0mm · 0.72mm/px · 2 of 30 slices shown (2 of 2)]
[im 1/30]
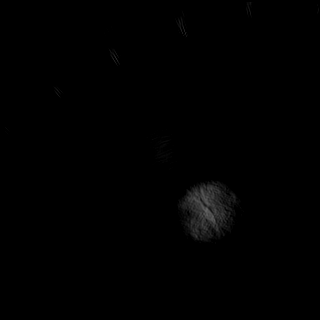
[im 30/30]
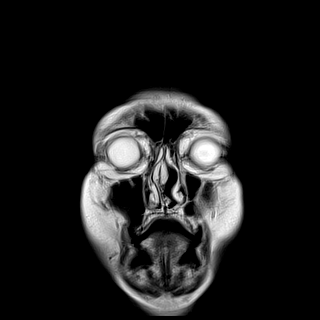

[44 of 48 positions shown; findings below may reference images not displayed]

FINDINGS: Brain: Examination mildly degraded by motion artifact.

Diffuse prominence of the CSF containing spaces compatible
generalized age-related cerebral atrophy. Patchy and confluent
T2/FLAIR hyperintensity within the periventricular deep white matter
both cerebral hemispheres most consistent with chronic small vessel
ischemic disease, moderate in nature. Patchy involvement of the pons
noted.

No abnormal foci of restricted diffusion to suggest acute or
subacute ischemia. Gray-white matter differentiation maintained. No
encephalomalacia to suggest chronic cortical infarction. No foci of
susceptibility artifact to suggest acute or chronic intracranial
hemorrhage.

No mass lesion, midline shift or mass effect. No hydrocephalus. No
extra-axial fluid collection. Pituitary gland and suprasellar region
within normal limits. Midline structures intact.

Vascular: Major intracranial vascular flow voids are maintained.

Skull and upper cervical spine: Craniocervical junction normal.
Upper cervical spine within normal limits. Bone marrow signal
intensity normal. No scalp soft tissue abnormality.

Sinuses/Orbits: Patient status post bilateral ocular lens
replacement. Paranasal sinuses are clear. Trace bilateral mastoid
effusions, of doubtful significance. Small amount of fluid noted
layering within the nasopharynx.

Other: None.
IMPRESSION: 1. No acute intracranial infarct or other abnormality.
2. Age-related cerebral atrophy with moderate chronic microvascular
ischemic disease.
